# Patient Record
Sex: Female | Born: 1985
Health system: Southern US, Community
[De-identification: ages and names within clinical notes are randomized; demographics above are authoritative.]

## PROBLEM LIST (undated history)

## (undated) ENCOUNTER — Inpatient Hospital Stay (HOSPITAL_COMMUNITY): Payer: Self-pay

## (undated) DIAGNOSIS — O24419 Gestational diabetes mellitus in pregnancy, unspecified control: Secondary | ICD-10-CM

## (undated) DIAGNOSIS — I10 Essential (primary) hypertension: Secondary | ICD-10-CM

## (undated) DIAGNOSIS — Z973 Presence of spectacles and contact lenses: Secondary | ICD-10-CM

## (undated) DIAGNOSIS — E119 Type 2 diabetes mellitus without complications: Secondary | ICD-10-CM

## (undated) HISTORY — DX: Type 2 diabetes mellitus without complications: E11.9

## (undated) HISTORY — PX: BELOW KNEE LEG AMPUTATION: SUR23

## (undated) HISTORY — PX: TONSILLECTOMY AND ADENOIDECTOMY: SUR1326

## (undated) HISTORY — PX: OTHER SURGICAL HISTORY: SHX169

---

## 2016-03-30 ENCOUNTER — Ambulatory Visit (INDEPENDENT_AMBULATORY_CARE_PROVIDER_SITE_OTHER): Payer: BLUE CROSS/BLUE SHIELD | Admitting: Pediatrics

## 2016-03-30 ENCOUNTER — Other Ambulatory Visit: Payer: Self-pay | Admitting: *Deleted

## 2016-03-30 ENCOUNTER — Encounter: Payer: Self-pay | Admitting: Pediatrics

## 2016-03-30 VITALS — BP 130/100 | HR 101 | Temp 98.3°F | Ht 67.0 in | Wt 215.2 lb

## 2016-03-30 DIAGNOSIS — Z0000 Encounter for general adult medical examination without abnormal findings: Secondary | ICD-10-CM | POA: Diagnosis not present

## 2016-03-30 DIAGNOSIS — Z6833 Body mass index (BMI) 33.0-33.9, adult: Secondary | ICD-10-CM

## 2016-03-30 DIAGNOSIS — IMO0001 Reserved for inherently not codable concepts without codable children: Secondary | ICD-10-CM

## 2016-03-30 DIAGNOSIS — O10919 Unspecified pre-existing hypertension complicating pregnancy, unspecified trimester: Secondary | ICD-10-CM | POA: Insufficient documentation

## 2016-03-30 DIAGNOSIS — R030 Elevated blood-pressure reading, without diagnosis of hypertension: Secondary | ICD-10-CM

## 2016-03-30 LAB — BAYER DCA HB A1C WAIVED

## 2016-03-30 MED ORDER — METFORMIN HCL 1000 MG PO TABS: 1000.0000 mg | ORAL_TABLET | Freq: Two times a day (BID) | ORAL | Status: DC

## 2016-03-30 NOTE — Progress Notes (Signed)
    Subjective:    Patient ID: [name], female    DOB: 1985-12-30, 30 y.o.   MRN: [id number]  CC: New Patient (Initial Visit)  CPE  HPI: [name] is a 30 y.o. female presenting for New Patient (Initial Visit)  Trying to conceive Has had a hard time [facility] possible [facility] LMP 03/04/2016 Never had treatment for DM or HTN in the past Last pap smear 01/2016 No fam hx of colon or breast cancer  Has been told she has an elevated HgA1c, but is not sure what the number was  Lives at home with fiance   [facility] 03/30/2016  Decreased Interest 0  Down, Depressed, Hopeless 0  PHQ - 2 Score 0   PMH: getting worked up for PCOS  Family History  Problem Relation Age of Onset  . Diabetes Mother   . Diabetes Maternal Grandmother   . Hypertension Mother   . Hypertension Maternal Grandmother     History  Smoking status  . Former Smoker  . Quit date: 03/30/2006  Smokeless tobacco  . Not on file       Objective:    BP 157/108 mmHg, recheck 130/100  Pulse 101  Temp(Src) 98.3 F (36.8 C) (Oral)  Ht 5\' 7"  (1.702 m)  Wt 215 lb 3.2 oz (97.614 kg)  BMI 33.70 kg/m2  LMP 03/04/2016  Wt Readings from Last 3 Encounters:  03/30/16 215 lb 3.2 oz (97.614 kg)     Gen: NAD, alert, cooperative with exam, NCAT EYES: EOMI, no scleral injection or icterus ENT:   OP without erythema LYMPH: no cervical LAD CV: NRRR, normal S1/S2, no murmur, distal pulses 2+ b/l Resp: CTABL, no wheezes, normal WOB Abd: +BS, soft, NTND.  Ext: No edema, warm Neuro: Alert and oriented, coordination grossly normal MSK: normal muscle bulk     Assessment & Plan:    [name] was seen today for new patient (initial visit), complete physical exam.  Diagnoses and all orders for this visit:  Encounter for preventive health examination -     Basic Metabolic Panel  BMI 33.0-33.9,adult -     Bayer DCA Hb A1c Waived -     TSH -     Lipid  panel  Elevated blood pressure -     Basic Metabolic Panel   Follow up plan: Return for 1 week for nurse BP check, if still elevated needs to see me.  Rex Krasarol Vincent, MD [facility] 03/30/2016, 11:28 AM

## 2016-03-31 LAB — BASIC METABOLIC PANEL
BUN/Creatinine Ratio: 16 (ref 9–23)
BUN: 10 mg/dL (ref 6–20)
CALCIUM: 9.9 mg/dL (ref 8.7–10.2)
CHLORIDE: 94 mmol/L — AB (ref 96–106)
CO2: 24 mmol/L (ref 18–29)
CREATININE: 0.61 mg/dL (ref 0.57–1.00)
GFR calc Af Amer: 143 mL/min/{1.73_m2} (ref >59)
GFR calc non Af Amer: 124 mL/min/{1.73_m2} (ref >59)
GLUCOSE: 312 mg/dL — AB (ref 65–99)
Potassium: 4.7 mmol/L (ref 3.5–5.2)
Sodium: 135 mmol/L (ref 134–144)

## 2016-03-31 LAB — LIPID PANEL
CHOLESTEROL TOTAL: 177 mg/dL (ref 100–199)
Chol/HDL Ratio: 4.9 ratio units — ABNORMAL HIGH (ref 0.0–4.4)
HDL: 36 mg/dL — AB (ref >39)
LDL Calculated: 92 mg/dL (ref 0–99)
Triglycerides: 245 mg/dL — ABNORMAL HIGH (ref 0–149)
VLDL CHOLESTEROL CAL: 49 mg/dL — AB (ref 5–40)

## 2016-03-31 LAB — TSH: TSH: 1.08 u[IU]/mL (ref 0.450–4.500)

## 2016-04-06 ENCOUNTER — Ambulatory Visit: Payer: BLUE CROSS/BLUE SHIELD | Admitting: *Deleted

## 2016-04-06 VITALS — BP 122/82 | HR 103

## 2016-04-06 DIAGNOSIS — Z0130 Encounter for examination of blood pressure without abnormal findings: Secondary | ICD-10-CM

## 2016-04-14 ENCOUNTER — Encounter: Payer: Self-pay | Admitting: Pharmacist

## 2016-04-14 ENCOUNTER — Ambulatory Visit (INDEPENDENT_AMBULATORY_CARE_PROVIDER_SITE_OTHER): Payer: BLUE CROSS/BLUE SHIELD | Admitting: Pharmacist

## 2016-04-14 VITALS — BP 136/82 | HR 90 | Ht 67.0 in | Wt 221.0 lb

## 2016-04-14 DIAGNOSIS — E1165 Type 2 diabetes mellitus with hyperglycemia: Secondary | ICD-10-CM | POA: Diagnosis not present

## 2016-04-14 DIAGNOSIS — O24119 Pre-existing diabetes mellitus, type 2, in pregnancy, unspecified trimester: Secondary | ICD-10-CM | POA: Insufficient documentation

## 2016-04-14 NOTE — Patient Instructions (Signed)
Diabetes and Standards of Medical Care   Diabetes is complicated. You may find that your diabetes team includes a dietitian, nurse, diabetes educator, eye doctor, and more. To help everyone know what is going on and to help you get the care you deserve, the following schedule of care was developed to help keep you on track. Below are the tests, exams, vaccines, medicines, education, and plans you will need.  Blood Glucose Goals Prior to meals = 80 - 130 Within 2 hours of the start of a meal = less than 180  HbA1c test (goal is less than 7.0% - your last value was over 14%) This test shows how well you have controlled your glucose over the past 2 to 3 months. It is used to see if your diabetes management plan needs to be adjusted.   It is performed at least 2 times a year if you are meeting treatment goals.  It is performed 4 times a year if therapy has changed or if you are not meeting treatment goals.  Blood pressure test  This test is performed at every routine medical visit. The goal is less than 140/90 mmHg for most people, but 130/80 mmHg in some cases. Ask your health care provider about your goal.  Dental exam  Follow up with the dentist regularly.  Eye exam  If you are diagnosed with type 1 diabetes as a child, get an exam upon reaching the age of 60 years or older and have had diabetes for 3 to 5 years. Yearly eye exams are recommended after that initial eye exam.  If you are diagnosed with type 1 diabetes as an adult, get an exam within 5 years of diagnosis and then yearly.  If you are diagnosed with type 2 diabetes, get an exam as soon as possible after the diagnosis and then yearly.  Foot care exam  Visual foot exams are performed at every routine medical visit. The exams check for cuts, injuries, or other problems with the feet.  A comprehensive foot exam should be done yearly. This includes visual inspection as well as assessing foot pulses and testing for loss of  sensation.  Check your feet nightly for cuts, injuries, or other problems with your feet. Tell your health care provider if anything is not healing.  Kidney function test (urine microalbumin)  This test is performed once a year.  Type 1 diabetes: The first test is performed 5 years after diagnosis.  Type 2 diabetes: The first test is performed at the time of diagnosis.  A serum creatinine and estimated glomerular filtration rate (eGFR) test is done once a year to assess the level of chronic kidney disease (CKD), if present.  Lipid profile (cholesterol, HDL, LDL, triglycerides)  Performed every 5 years for most people.  The goal for LDL is less than 100 mg/dL. If you are at high risk, the goal is less than 70 mg/dL. Yours was 92.  The goal for HDL is 40 mg/dL to 50 mg/dL for men and 50 mg/dL to 60 mg/dL for women. An HDL cholesterol of 60 mg/dL or higher gives some protection against heart disease. Your was 47.  The goal for triglycerides is less than 150 mg/dL. Yours 245.  Influenza vaccine, pneumococcal vaccine, and hepatitis B vaccine  The influenza vaccine is recommended yearly.  The pneumococcal vaccine is generally given once in a lifetime. However, there are some instances when another vaccination is recommended. Check with your health care provider.  The hepatitis B  vaccine is also recommended for adults with diabetes.  Diabetes self-management education  Education is recommended at diagnosis and ongoing as needed.  Treatment plan  Your treatment plan is reviewed at every medical visit.  Document Released: 07/16/2009 Document Revised: 05/21/2013 Document Reviewed: 02/18/2013 [facility] Patient Information 2014 [place].

## 2016-04-14 NOTE — Progress Notes (Signed)
  Subjective:    [name] is a 30 y.o. female who presents for an initial evaluation of Type 2 diabetes mellitus.  Current symptoms/problems include polydipsia, polyuria and weight loss and have been improving. Symptoms have been present for 2 months.  The patient was initially diagnosed with Type 2 diabetes mellitus 06/29/2017based on the following criteria:  BG of 312 and A1c of over 14%  Known diabetic complications: none Cardiovascular risk factors: diabetes mellitus, obesity (BMI >= 30 kg/m2) and sedentary lifestyle Current diabetic medications include metformin 1000mg  bid.   Eye exam current (within one year): had eye exam 12/2015 but was prior to DM diagnosis Weight trend: fluctuating a bit Prior visit with dietician: no Current diet: in general, an "unhealthy" diet Current exercise: walking - started about 2 weeks ago  Current monitoring regimen: home blood tests - 1-2 times daily Home blood sugar records: has decreased to 200's over the last week;  previously was 300's Any episodes of hypoglycemia? no  Is She on ACE inhibitor or angiotensin II receptor blocker?  No  - patient wishes to get pregnant so I do not recommend that ACE or ARB therapy be started at this time  The following portions of the patient's history were reviewed and updated as appropriate: allergies, current medications, past family history, past medical history, past social history, past surgical history and problem list.    Objective:    BP 136/82 mmHg  Pulse 90  Ht 5\' 7"  (1.702 m)  Wt 221 lb (100.245 kg)  BMI 34.61 kg/m2  LMP 03/04/2016  A1c = over 14% (03/30/2016)  Lab Review GLUCOSE (mg/dL)  Date Value  16/10/960406/29/2017 312*   CO2 (mmol/L)  Date Value  03/30/2016 24   BUN (mg/dL)  Date Value  54/09/811906/29/2017 10   CREATININE, SER (mg/dL)  Date Value  14/78/295606/29/2017 0.61     Assessment:    Diabetes Mellitus type II, under inadequate control.    Plan:    1.  Rx changes: continue metformin  1000mg  bid.  2.  Education: Reviewed 'ABCs' of diabetes management (respective goals in parentheses):  A1C (<7), blood pressure (<130/80), and cholesterol (LDL <100). Discussed HBG goals. 3. Discussed pathophysiology of DM; difference between type 1 and type 2 DM. 4. CHO counting diet discussed.  Reviewed CHO amount in various foods and how to read nutrition labels.  Discussed recommended serving sizes. Samples diet given.  5.  Recommended increase physical activity - goal is 150 minutes per week 4. Follow up: 1 month

## 2016-04-19 ENCOUNTER — Encounter: Payer: Self-pay | Admitting: Pediatrics

## 2016-04-19 ENCOUNTER — Ambulatory Visit (INDEPENDENT_AMBULATORY_CARE_PROVIDER_SITE_OTHER): Payer: BLUE CROSS/BLUE SHIELD | Admitting: Pediatrics

## 2016-04-19 VITALS — BP 124/90 | HR 106 | Temp 98.9°F | Ht 67.0 in | Wt 214.6 lb

## 2016-04-19 DIAGNOSIS — N390 Urinary tract infection, site not specified: Secondary | ICD-10-CM

## 2016-04-19 DIAGNOSIS — R030 Elevated blood-pressure reading, without diagnosis of hypertension: Secondary | ICD-10-CM | POA: Diagnosis not present

## 2016-04-19 DIAGNOSIS — IMO0001 Reserved for inherently not codable concepts without codable children: Secondary | ICD-10-CM

## 2016-04-19 DIAGNOSIS — E1165 Type 2 diabetes mellitus with hyperglycemia: Secondary | ICD-10-CM | POA: Diagnosis not present

## 2016-04-19 DIAGNOSIS — Z6833 Body mass index (BMI) 33.0-33.9, adult: Secondary | ICD-10-CM | POA: Diagnosis not present

## 2016-04-19 DIAGNOSIS — N926 Irregular menstruation, unspecified: Secondary | ICD-10-CM

## 2016-04-19 DIAGNOSIS — N3000 Acute cystitis without hematuria: Secondary | ICD-10-CM

## 2016-04-19 DIAGNOSIS — R300 Dysuria: Secondary | ICD-10-CM

## 2016-04-19 DIAGNOSIS — R8281 Pyuria: Secondary | ICD-10-CM

## 2016-04-19 LAB — URINALYSIS, COMPLETE
Bilirubin, UA: NEGATIVE
Nitrite, UA: NEGATIVE
SPEC GRAV UA: 1.025 (ref 1.005–1.030)
Urobilinogen, Ur: 0.2 mg/dL (ref 0.2–1.0)
pH, UA: 5.5 (ref 5.0–7.5)

## 2016-04-19 LAB — MICROSCOPIC EXAMINATION

## 2016-04-19 LAB — PREGNANCY, URINE: PREG TEST UR: NEGATIVE

## 2016-04-19 NOTE — Progress Notes (Addendum)
    Subjective:    Patient ID: [name], female    DOB: [birth year], 30 y.o.   MRN: [id number]  CC: Urinary Tract Infection   HPI: [name] is a 30 y.o. female presenting for Urinary Tract Infection  Problems with most of the time going to the bathroom, has pain and pressure, and some burning Ongoing past three days No blood in urine No cramping OTC cystex helped some Trying to get pregnant No fevers BGLs comeing down, initially in 300s, now 200s in the morning Taking metformin daily Headaches improved     [facility] 04/19/2016 04/14/2016 04/06/2016 03/30/2016  Decreased Interest 0 0 0 0  Down, Depressed, Hopeless 0 0 0 0  PHQ - 2 Score 0 0 0 0     Relevant past medical, surgical, family and social history reviewed and updated as indicated.  Interim medical history since our last visit reviewed. Allergies and medications reviewed and updated.  ROS: Per HPI unless specifically indicated above  History  Smoking status  . Former Smoker -- 0 years  . Quit date: 03/30/2006  Smokeless tobacco  . Not on file       Objective:    BP 124/90 mmHg  Pulse 106  Temp(Src) 98.9 F (37.2 C) (Oral)  Ht 5\' 7"  (1.702 m)  Wt 214 lb 9.6 oz (97.342 kg)  BMI 33.60 kg/m2  LMP 03/04/2016  Wt Readings from Last 3 Encounters:  04/19/16 214 lb 9.6 oz (97.342 kg)  04/14/16 221 lb (100.245 kg)  03/30/16 215 lb 3.2 oz (97.614 kg)    Gen: NAD, alert, cooperative with exam, NCAT EYES: EOMI, no scleral injection or icterus CV: NRRR, normal S1/S2, no murmur, distal pulses 2+ b/l Resp: CTABL, no wheezes, normal WOB Abd: +BS, soft, NTND. no guarding or organomegaly, no CVA tenderness Ext: No edema, warm Neuro: Alert and oriented     Assessment & Plan:    [name] was seen today for urinary tract infection symptoms and follow up.  Diagnoses and all orders for this visit:  Dysuria Small amount of WBC Symptoms intermittent not with every void Will send urine  culture, treat as indicated Pt trying to get pregnant -     Urinalysis, Complete  Missed period 10 days late Irregular periods with PCOS Neg home test 3 days ago -     Pregnancy, urine  Pyuria -     Urine culture  Type 2 diabetes mellitus with hyperglycemia, without long-term current use of insulin (HCC) Metformin improving AM BGLs continue  BMI 33.0-33.9,adult Cont metformin, lifestyle changes  Elevated blood pressure Improved with recheck to 124/90, still slightly elevated Cont lifestyle changes as above   Follow up plan: Return in about 3 months (around 07/13/2016) for DM follow up.  Rex Krasarol Jujhar Everett, MD [facility] 04/19/2016, 10:43 AM  ADDENDUM: growing serratia, treating with cipro 250mg  BID for 3 days.

## 2016-04-21 LAB — URINE CULTURE

## 2016-04-21 MED ORDER — CIPROFLOXACIN HCL 250 MG PO TABS
250.0000 mg | ORAL_TABLET | Freq: Two times a day (BID) | ORAL | Status: DC
Start: 2016-04-21 — End: 2018-05-28

## 2016-04-21 NOTE — Addendum Note (Signed)
Addended by: Johna SheriffVINCENT, CAROL L on: 04/21/2016 01:21 PM   Modules accepted: Orders, SmartSet

## 2016-05-16 ENCOUNTER — Ambulatory Visit: Payer: Self-pay | Admitting: Pharmacist

## 2016-05-17 ENCOUNTER — Encounter: Payer: Self-pay | Admitting: Pediatrics

## 2016-05-23 ENCOUNTER — Telehealth: Payer: Self-pay | Admitting: Pharmacist

## 2016-05-23 NOTE — Telephone Encounter (Signed)
Patient misses follow up diabetes appt last week.  I called to see how BG was going and to reschedule.  No answer but left message on VM

## 2016-10-13 ENCOUNTER — Telehealth: Payer: Self-pay | Admitting: Pharmacist

## 2016-10-13 NOTE — Telephone Encounter (Signed)
Patient called and reminded that f/u with PCP is recommended for DM.  No ans but LM on VM

## 2018-05-28 ENCOUNTER — Telehealth: Payer: Self-pay | Admitting: Pediatrics

## 2018-05-28 ENCOUNTER — Ambulatory Visit (INDEPENDENT_AMBULATORY_CARE_PROVIDER_SITE_OTHER): Payer: BLUE CROSS/BLUE SHIELD | Admitting: Pediatrics

## 2018-05-28 ENCOUNTER — Encounter: Payer: Self-pay | Admitting: Pediatrics

## 2018-05-28 VITALS — BP 137/84 | HR 105 | Temp 99.9°F | Ht 67.0 in | Wt 201.2 lb

## 2018-05-28 DIAGNOSIS — E1165 Type 2 diabetes mellitus with hyperglycemia: Secondary | ICD-10-CM

## 2018-05-28 DIAGNOSIS — E119 Type 2 diabetes mellitus without complications: Secondary | ICD-10-CM | POA: Diagnosis not present

## 2018-05-28 DIAGNOSIS — N926 Irregular menstruation, unspecified: Secondary | ICD-10-CM

## 2018-05-28 LAB — BMP8+EGFR
BUN / CREAT RATIO: 18 (ref 9–23)
BUN: 11 mg/dL (ref 6–20)
CHLORIDE: 97 mmol/L (ref 96–106)
CO2: 21 mmol/L (ref 20–29)
Calcium: 9.5 mg/dL (ref 8.7–10.2)
Creatinine, Ser: 0.62 mg/dL (ref 0.57–1.00)
GFR calc non Af Amer: 121 mL/min/{1.73_m2} (ref >59)
GFR, EST AFRICAN AMERICAN: 140 mL/min/{1.73_m2} (ref >59)
Glucose: 259 mg/dL — ABNORMAL HIGH (ref 65–99)
Potassium: 4.6 mmol/L (ref 3.5–5.2)
Sodium: 133 mmol/L — ABNORMAL LOW (ref 134–144)

## 2018-05-28 LAB — BAYER DCA HB A1C WAIVED: HB A1C: 12.3 % — AB (ref <7.0)

## 2018-05-28 LAB — PREGNANCY, URINE: Preg Test, Ur: POSITIVE — AB

## 2018-05-28 MED ORDER — PEN NEEDLES 31G X 5 MM MISC: 1.0000 | Freq: Every day | 2 refills | Status: DC | PRN

## 2018-05-28 MED ORDER — INSULIN LISPRO 200 UNIT/ML ~~LOC~~ SOPN: 0.0000 [IU] | PEN_INJECTOR | Freq: Three times a day (TID) | SUBCUTANEOUS | 0 refills | Status: DC

## 2018-05-28 MED ORDER — INSULIN DETEMIR 100 UNIT/ML FLEXPEN: 25.0000 [IU] | PEN_INJECTOR | Freq: Every day | SUBCUTANEOUS | 11 refills | Status: DC

## 2018-05-28 NOTE — Progress Notes (Signed)
Subjective:   Patient ID: [name], female    DOB: 02-Jan-1986, 32 y.o.   MRN: [id number] CC: Positive home pregnancy test  HPI: [name] is a 32 y.o. female   Last menstrual period July 5-11.  Had a positive home pregnancy test at home.  Positive Upreg here as well.  History of type 2 diabetes.  Diagnosed 1 to 2 years ago.  Was started on insulin at time of diagnosis because of elevated blood sugars.  Following in [facility] at the time.  She has been off of insulin and metformin for the last few months.  She is not taking prenatal vitamins.  She is been feeling slightly nauseous, otherwise feeling her normal self.  She works third shift.  She eats multiple small meals a day rather than regularly scheduled meals.  She does not have an OB gynecologist who she is been seeing regularly. No abd pain, cramping or bleeding.  Relevant past medical, surgical, family and social history reviewed. Allergies and medications reviewed and updated. Social History   Tobacco Use  Smoking Status Former Smoker  . Years: 0.00  . Last attempt to quit: 03/30/2006  . Years since quitting: 12.1  Smokeless Tobacco Never Used   ROS: Per HPI   Objective:    BP 137/84   Pulse (!) 105   Temp 99.9 F (37.7 C) (Oral)   Ht '5\' 7"'  (1.702 m)   Wt 201 lb 3.2 oz (91.3 kg)   LMP 04/05/2018   BMI 31.51 kg/m   Wt Readings from Last 3 Encounters:  05/28/18 201 lb 3.2 oz (91.3 kg)  04/19/16 214 lb 9.6 oz (97.3 kg)  04/14/16 221 lb (100.2 kg)    Gen: NAD, alert, cooperative with exam, NCAT EYES: EOMI, no conjunctival injection, or no icterus ENT:  TMs pearly gray b/l, OP without erythema LYMPH: no cervical LAD CV: NRRR, normal S1/S2, no murmur, distal pulses 2+ b/l Resp: CTABL, no wheezes, normal WOB Abd: +BS, soft, NTND. no guarding or organomegaly Ext: No edema, warm Neuro: Alert and oriented, strength equal b/l UE and LE, coordination grossly normal MSK: normal  muscle bulk  Assessment & Plan:  Nohelia was seen today for positive home pregnancy test.  Diagnoses and all orders for this visit:  Missed period LMP started 7/5 -     Pregnancy, urine -     Beta hCG quant (ref lab) -     Ambulatory referral to Obstetrics / Gynecology -     Ambulatory referral to Endocrinology  Diabetes mellitus without complication ([place]) -     Bayer DCA Hb A1c Waived -     BMP8+EGFR  Uncontrolled type 2 diabetes mellitus with hyperglycemia (HCC) A1c 12.3 off of DM2 medicines.  Restart Levemir, 25 units once a day at night.  Start short acting insulin lispro, 3 units plus sliding scale 3 times a day before meals.  Check blood sugars 3 times a day before meals and before Levemir at night, send me home sugar numbers in 5 days.  Follow-up in 1 week.  Referral in to endocrinology and OB to establish care. -     Ambulatory referral to Obstetrics / Gynecology -     Ambulatory referral to Endocrinology -     Insulin Detemir (LEVEMIR) 100 UNIT/ML Pen; Inject 25 Units into the skin at bedtime. -     TSH -     Insulin Lispro (HUMALOG KWIKPEN) 200 UNIT/ML SOPN; Inject 0-15 Units into the  skin 3 (three) times daily before meals. As directed   Follow up plan: 1 week Assunta Found, MD [place]

## 2018-05-28 NOTE — Telephone Encounter (Signed)
Cancelled prescription sent to wrong pharmacy, [facility] [place], patient aware

## 2018-05-28 NOTE — Patient Instructions (Addendum)
Check glucose level before meals and before giving levemir.   Long acting insulin detemir 25units at night.  Short acting insulin: give 3 units plus sliding scale per below before meals three times a day  Send me glucose readings in 5 days.   CBG 151 - 200: 3 units;  CBG 201 - 250: 5 units;  CBG 251 - 300: 7 units;  CBG 301 - 350: 9 units;   CBG > 400 : 11 units and notify your doctor immediately. Recheck CBG in 1 hour

## 2018-05-29 LAB — BETA HCG QUANT (REF LAB): hCG Quant: 15937 m[IU]/mL

## 2018-05-30 ENCOUNTER — Telehealth: Payer: Self-pay | Admitting: Pediatrics

## 2018-05-30 NOTE — Telephone Encounter (Signed)
Aware of results. 

## 2018-05-30 NOTE — Telephone Encounter (Signed)
hasnt been looked at yet? Wants result of pregnancy and a1c

## 2018-06-20 ENCOUNTER — Encounter: Payer: Self-pay | Admitting: Family Medicine

## 2018-06-20 ENCOUNTER — Ambulatory Visit (INDEPENDENT_AMBULATORY_CARE_PROVIDER_SITE_OTHER): Payer: BLUE CROSS/BLUE SHIELD | Admitting: Family Medicine

## 2018-06-20 VITALS — BP 136/87 | HR 110 | Wt 214.0 lb

## 2018-06-20 DIAGNOSIS — Z1151 Encounter for screening for human papillomavirus (HPV): Secondary | ICD-10-CM | POA: Diagnosis not present

## 2018-06-20 DIAGNOSIS — Z113 Encounter for screening for infections with a predominantly sexual mode of transmission: Secondary | ICD-10-CM

## 2018-06-20 DIAGNOSIS — Z124 Encounter for screening for malignant neoplasm of cervix: Secondary | ICD-10-CM

## 2018-06-20 DIAGNOSIS — O0991 Supervision of high risk pregnancy, unspecified, first trimester: Secondary | ICD-10-CM | POA: Insufficient documentation

## 2018-06-20 DIAGNOSIS — Z01419 Encounter for gynecological examination (general) (routine) without abnormal findings: Secondary | ICD-10-CM | POA: Diagnosis not present

## 2018-06-20 DIAGNOSIS — Z3687 Encounter for antenatal screening for uncertain dates: Secondary | ICD-10-CM

## 2018-06-20 DIAGNOSIS — E1165 Type 2 diabetes mellitus with hyperglycemia: Secondary | ICD-10-CM

## 2018-06-20 DIAGNOSIS — Z6833 Body mass index (BMI) 33.0-33.9, adult: Secondary | ICD-10-CM

## 2018-06-20 MED ORDER — INSULIN DETEMIR 100 UNIT/ML FLEXPEN: 20.0000 [IU] | PEN_INJECTOR | Freq: Two times a day (BID) | SUBCUTANEOUS | 11 refills | Status: DC

## 2018-06-20 MED ORDER — INSULIN LISPRO 200 UNIT/ML ~~LOC~~ SOPN: 6.0000 [IU] | PEN_INJECTOR | Freq: Three times a day (TID) | SUBCUTANEOUS | 0 refills | Status: DC

## 2018-06-20 NOTE — Progress Notes (Signed)
Subjective:  [name] is a G1P0 79w4dbeing seen today for her first obstetrical visit.  Her obstetrical history is significant for type 2 diabetes - not controlled. Patient does intend to breast feed. Pregnancy history fully reviewed.  Patient reports no complaints.  BP 136/87   Pulse (!) 110   Wt 214 lb 0.6 oz (97.1 kg)   LMP 04/05/2018 (Exact Date)   BMI 33.52 kg/m   HISTORY: OB History  Gravida Para Term Preterm AB Living  1            SAB TAB Ectopic Multiple Live Births               # Outcome Date GA Lbr Len/2nd Weight Sex Delivery Anes PTL Lv  1 Current             Past Medical History:  Diagnosis Date  . Diabetes mellitus without complication ([facility]     Past Surgical History:  Procedure Laterality Date  . TONSILLECTOMY AND ADENOIDECTOMY      Family History  Problem Relation Age of Onset  . Diabetes Mother   . Hypertension Mother   . Hypertension Maternal Grandmother   . Diabetes Maternal Aunt   . Diabetes Maternal Uncle      Exam    Uterus:     Pelvic Exam:    Perineum: No Hemorrhoids, Normal Perineum   Vulva: normal, Bartholin's, Urethra, Skene's normal   Vagina:  normal mucosa, normal discharge   Cervix: anteverted, no bleeding following Pap, no cervical motion tenderness and nulliparous appearance   Adnexa: normal adnexa and no mass, fullness, tenderness   Bony Pelvis: gynecoid  System: Breast:  normal appearance, no masses or tenderness, Inspection negative, No nipple retraction or dimpling, No nipple discharge or bleeding, No axillary or supraclavicular adenopathy   Skin: normal coloration and turgor, no rashes    Neurologic: gait normal; reflexes normal and symmetric   Extremities: normal strength, tone, and muscle mass   HEENT PERRLA and extra ocular movement intact   Mouth/Teeth mucous membranes moist, pharynx normal without lesions   Neck supple and no masses   Cardiovascular: regular rate and rhythm, no murmurs or gallops   Respiratory:  appears well, vitals normal, no respiratory distress, acyanotic, normal RR, ear and throat exam is normal, neck free of mass or lymphadenopathy, chest clear, no wheezing, crepitations, rhonchi, normal symmetric air entry   Abdomen: soft, non-tender; bowel sounds normal; no masses,  no organomegaly   Urinary: urethral meatus normal      Assessment:    Pregnancy: G1P0 Patient Active Problem List   Diagnosis Date Noted  . Supervision of high risk pregnancy, antepartum, first trimester 06/20/2018  . Type 2 diabetes mellitus ([place] 04/14/2016  . BMI 33.0-33.9,adult 03/30/2016  . Elevated blood pressure 03/30/2016      Plan:   1. Supervision of high risk pregnancy, antepartum, first trimester Desires Quad screen PNL Discussed nature of practice  - [facility] AMB BABYSCRIPTS OPT IN - Culture, OB Urine - Cystic fibrosis gene test - Cytology - PAP - Hemoglobinopathy Evaluation - Obstetric Panel, Including HIV - SMN1 COPY NUMBER ANALYSIS (SMA Carrier Screen) - HgB A1c - Comp Met (CMET) - POC Urinalysis Dipstick OB - Protein / creatinine ratio, urine - TSH  2. Uncontrolled type 2 diabetes mellitus with hyperglycemia (HCC) Increase levemir to 20units BID Aspart 6 units premeal Check CBGs fasting and 1hr PP. CMP, UP:C Fetal echo Already referred to ophthalmology - Ambulatory referral to Pediatric Cardiology -  Insulin Detemir (LEVEMIR) 100 UNIT/ML Pen; Inject 20 Units into the skin 2 (two) times daily.  Dispense: 15 mL; Refill: 11 - Insulin Lispro (HUMALOG KWIKPEN) 200 UNIT/ML SOPN; Inject 6 Units into the skin 3 (three) times daily before meals.  Dispense: 9 mL; Refill: 0  3. BMI 33.0-33.9,adult      Problem list reviewed and updated. 75% of 45 min visit spent on counseling and coordination of care.     [name] 06/20/2018

## 2018-06-20 NOTE — Progress Notes (Signed)
DATING AND VIABILITY SONOGRAM   [name] is a 32 y.o. year old G1P0 with LMP Patient's last menstrual period was 04/05/2018 (exact date). which would correlate to  4938w6d weeks gestation.  She has regular menstrual cycles.   She is here today for a confirmatory initial sonogram.    GESTATION: SINGLETON  FETAL ACTIVITY:          Heart rate         180          The fetus is active.     GESTATIONAL AGE AND  BIOMETRICS:  Gestational criteria: Estimated Date of Delivery: 01/10/19 by LMP now at 9038w6d  Previous Scans:0      CROWN RUMP LENGTH        2.78 cm        9-4 weeks                                                                               AVERAGE EGA(BY THIS SCAN): 9-4weeks  WORKING EDD( early ultrasound ):  01-19-19     Armandina StammerJennifer Howard 06/20/2018 10:26 AM

## 2018-06-21 LAB — PROTEIN / CREATININE RATIO, URINE
Creatinine, Urine: 32.9 mg/dL
Protein, Ur: 37.7 mg/dL
Protein/Creat Ratio: 1146 mg/g creat — ABNORMAL HIGH (ref 0–200)

## 2018-06-21 LAB — TSH: TSH: 1.45 u[IU]/mL (ref 0.450–4.500)

## 2018-06-22 LAB — URINE CULTURE, OB REFLEX

## 2018-06-22 LAB — CULTURE, OB URINE

## 2018-06-23 DIAGNOSIS — O0991 Supervision of high risk pregnancy, unspecified, first trimester: Secondary | ICD-10-CM

## 2018-06-24 ENCOUNTER — Encounter: Payer: Self-pay | Admitting: Family Medicine

## 2018-06-24 DIAGNOSIS — O1210 Gestational proteinuria, unspecified trimester: Secondary | ICD-10-CM | POA: Insufficient documentation

## 2018-06-25 ENCOUNTER — Encounter: Payer: Self-pay | Admitting: Family Medicine

## 2018-06-25 DIAGNOSIS — R87618 Other abnormal cytological findings on specimens from cervix uteri: Secondary | ICD-10-CM | POA: Insufficient documentation

## 2018-06-25 DIAGNOSIS — R8789 Other abnormal findings in specimens from female genital organs: Secondary | ICD-10-CM | POA: Insufficient documentation

## 2018-06-25 LAB — CYTOLOGY - PAP
CHLAMYDIA, DNA PROBE: NEGATIVE
Diagnosis: NEGATIVE
HPV: DETECTED — AB
Neisseria Gonorrhea: NEGATIVE

## 2018-06-28 LAB — OBSTETRIC PANEL, INCLUDING HIV
Antibody Screen: NEGATIVE
BASOS ABS: 0.1 10*3/uL (ref 0.0–0.2)
Basos: 1 %
EOS (ABSOLUTE): 0.1 10*3/uL (ref 0.0–0.4)
Eos: 0 %
HEP B S AG: NEGATIVE
HIV SCREEN 4TH GENERATION: NONREACTIVE
Hematocrit: 36 % (ref 34.0–46.6)
Hemoglobin: 12.3 g/dL (ref 11.1–15.9)
Immature Grans (Abs): 0 10*3/uL (ref 0.0–0.1)
Immature Granulocytes: 0 %
LYMPHS ABS: 2.8 10*3/uL (ref 0.7–3.1)
Lymphs: 24 %
MCH: 29.4 pg (ref 26.6–33.0)
MCHC: 34.2 g/dL (ref 31.5–35.7)
MCV: 86 fL (ref 79–97)
MONOCYTES: 6 %
Monocytes Absolute: 0.7 10*3/uL (ref 0.1–0.9)
NEUTROS ABS: 8.2 10*3/uL — AB (ref 1.4–7.0)
Neutrophils: 69 %
PLATELETS: 474 10*3/uL — AB (ref 150–450)
RBC: 4.18 x10E6/uL (ref 3.77–5.28)
RDW: 12.9 % (ref 12.3–15.4)
RH TYPE: POSITIVE
RPR: NONREACTIVE
RUBELLA: 1.73 {index} (ref >0.99)
WBC: 11.8 10*3/uL — AB (ref 3.4–10.8)

## 2018-06-28 LAB — SMN1 COPY NUMBER ANALYSIS (SMA CARRIER SCREENING)

## 2018-06-28 LAB — HEMOGLOBINOPATHY EVALUATION
FERRITIN: 95 ng/mL (ref 15–150)
HGB A: 98 % (ref 96.4–98.8)
HGB C: 0 %
HGB VARIANT: 0 %
Hgb A2 Quant: 2 % (ref 1.8–3.2)
Hgb F Quant: 0 % (ref 0.0–2.0)
Hgb S: 0 %
Hgb Solubility: NEGATIVE

## 2018-06-28 LAB — COMPREHENSIVE METABOLIC PANEL
ALK PHOS: 47 IU/L (ref 39–117)
ALT: 16 IU/L (ref 0–32)
AST: 12 IU/L (ref 0–40)
Albumin/Globulin Ratio: 1.3 (ref 1.2–2.2)
Albumin: 4.2 g/dL (ref 3.5–5.5)
BILIRUBIN TOTAL: 0.6 mg/dL (ref 0.0–1.2)
BUN/Creatinine Ratio: 17 (ref 9–23)
BUN: 9 mg/dL (ref 6–20)
CHLORIDE: 101 mmol/L (ref 96–106)
CO2: 23 mmol/L (ref 20–29)
CREATININE: 0.52 mg/dL — AB (ref 0.57–1.00)
Calcium: 9.9 mg/dL (ref 8.7–10.2)
GFR calc Af Amer: 148 mL/min/{1.73_m2} (ref >59)
GFR calc non Af Amer: 129 mL/min/{1.73_m2} (ref >59)
GLUCOSE: 138 mg/dL — AB (ref 65–99)
Globulin, Total: 3.3 g/dL (ref 1.5–4.5)
Potassium: 4.4 mmol/L (ref 3.5–5.2)
Sodium: 139 mmol/L (ref 134–144)
Total Protein: 7.5 g/dL (ref 6.0–8.5)

## 2018-06-28 LAB — HEMOGLOBIN A1C
ESTIMATED AVERAGE GLUCOSE: 258 mg/dL
Hgb A1c MFr Bld: 10.6 % — ABNORMAL HIGH (ref 4.8–5.6)

## 2018-06-28 LAB — CYSTIC FIBROSIS GENE TEST

## 2018-07-04 ENCOUNTER — Ambulatory Visit (INDEPENDENT_AMBULATORY_CARE_PROVIDER_SITE_OTHER): Payer: BLUE CROSS/BLUE SHIELD | Admitting: Family Medicine

## 2018-07-04 VITALS — BP 141/79 | HR 115 | Wt 215.0 lb

## 2018-07-04 DIAGNOSIS — O0991 Supervision of high risk pregnancy, unspecified, first trimester: Secondary | ICD-10-CM

## 2018-07-04 DIAGNOSIS — O09891 Supervision of other high risk pregnancies, first trimester: Secondary | ICD-10-CM | POA: Diagnosis not present

## 2018-07-04 DIAGNOSIS — R87618 Other abnormal cytological findings on specimens from cervix uteri: Secondary | ICD-10-CM

## 2018-07-04 DIAGNOSIS — E1165 Type 2 diabetes mellitus with hyperglycemia: Secondary | ICD-10-CM

## 2018-07-04 DIAGNOSIS — Z6833 Body mass index (BMI) 33.0-33.9, adult: Secondary | ICD-10-CM

## 2018-07-04 DIAGNOSIS — R8789 Other abnormal findings in specimens from female genital organs: Secondary | ICD-10-CM

## 2018-07-04 MED ORDER — INSULIN DETEMIR 100 UNIT/ML FLEXPEN: 30.0000 [IU] | PEN_INJECTOR | Freq: Two times a day (BID) | SUBCUTANEOUS | 11 refills | Status: DC

## 2018-07-04 MED ORDER — METFORMIN HCL 1000 MG PO TABS: 500.0000 mg | ORAL_TABLET | Freq: Two times a day (BID) | ORAL | 3 refills | Status: DC

## 2018-07-04 MED ORDER — INSULIN LISPRO 200 UNIT/ML ~~LOC~~ SOPN: 10.0000 [IU] | PEN_INJECTOR | Freq: Three times a day (TID) | SUBCUTANEOUS | 0 refills | Status: DC

## 2018-07-04 NOTE — Progress Notes (Signed)
Patient would like to discuss blood sugars and changing her insulin doses. [name] RN

## 2018-07-04 NOTE — Addendum Note (Signed)
Addended by: Anell Barr on: 07/04/2018 10:52 AM   Modules accepted: Orders

## 2018-07-04 NOTE — Progress Notes (Signed)
Subjective:  [name] is a 32 y.o. G1P0 at [redacted]w[redacted]d being seen today for ongoing prenatal care.  She is currently monitored for the following issues for this high-risk pregnancy and has BMI 33.0-33.9,adult; Elevated blood pressure; Type 2 diabetes mellitus (HCC); Supervision of high risk pregnancy, antepartum, first trimester; Proteinuria affecting pregnancy; and Pap smear abnormality of cervix/human papillomavirus (HPV) positive on their problem list.  GDM: Patient taking metformin and insulin.  Reports 1 hypoglycemic episodes.  Tolerating medication well. Was nervous about taking metformin, so didn't Fasting: works third shift, no real fasting CBG 2hr PP: 140-200  Patient reports no complaints.  Contractions: Not present. Vag. Bleeding: None.   . Denies leaking of fluid.   The following portions of the patient's history were reviewed and updated as appropriate: allergies, current medications, past family history, past medical history, past social history, past surgical history and problem list. Problem list updated.  Objective:   Vitals:   07/04/18 0947  BP: (!) 141/79  Pulse: (!) 115  Weight: 215 lb (97.5 kg)    Fetal Status:           General:  Alert, oriented and cooperative. Patient is in no acute distress.  Skin: Skin is warm and dry. No rash noted.   Cardiovascular: Normal heart rate noted  Respiratory: Normal respiratory effort, no problems with respiration noted  Abdomen: Soft, gravid, appropriate for gestational age. Pain/Pressure: Absent     Pelvic: Vag. Bleeding: None     Cervical exam deferred        Extremities: Normal range of motion.  Edema: None  Mental Status: Normal mood and affect. Normal behavior. Normal judgment and thought content.   Urinalysis:      Assessment and Plan:  Pregnancy: G1P0 at [redacted]w[redacted]d  1. Supervision of high risk pregnancy, antepartum, first trimester FHT normal. Heartburn  2. Uncontrolled type 2 diabetes mellitus with hyperglycemia  (HCC) Reassured pt regarding metformin Increase levemir to 30 units bid humalog to 10 units premeal - Insulin Lispro (HUMALOG KWIKPEN) 200 UNIT/ML SOPN; Inject 10 Units into the skin 3 (three) times daily before meals.  Dispense: 9 mL; Refill: 0 - Insulin Detemir (LEVEMIR) 100 UNIT/ML Pen; Inject 30 Units into the skin 2 (two) times daily.  Dispense: 15 mL; Refill: 11  3. BMI 33.0-33.9,adult   4. Pap smear abnormality of cervix/human papillomavirus (HPV) positive PAP in 1 year.  Preterm labor symptoms and general obstetric precautions including but not limited to vaginal bleeding, contractions, leaking of fluid and fetal movement were reviewed in detail with the patient. Please refer to After Visit Summary for other counseling recommendations.  No follow-ups on file.   Levie Heritage, DO

## 2018-07-11 ENCOUNTER — Encounter: Payer: Self-pay | Admitting: Endocrinology

## 2018-07-19 ENCOUNTER — Ambulatory Visit (INDEPENDENT_AMBULATORY_CARE_PROVIDER_SITE_OTHER): Payer: BLUE CROSS/BLUE SHIELD | Admitting: Obstetrics & Gynecology

## 2018-07-19 VITALS — BP 126/79 | HR 113 | Wt 217.1 lb

## 2018-07-19 DIAGNOSIS — O24111 Pre-existing diabetes mellitus, type 2, in pregnancy, first trimester: Secondary | ICD-10-CM

## 2018-07-19 DIAGNOSIS — O10919 Unspecified pre-existing hypertension complicating pregnancy, unspecified trimester: Secondary | ICD-10-CM

## 2018-07-19 DIAGNOSIS — Z23 Encounter for immunization: Secondary | ICD-10-CM | POA: Diagnosis not present

## 2018-07-19 DIAGNOSIS — O0991 Supervision of high risk pregnancy, unspecified, first trimester: Secondary | ICD-10-CM

## 2018-07-19 DIAGNOSIS — O10911 Unspecified pre-existing hypertension complicating pregnancy, first trimester: Secondary | ICD-10-CM

## 2018-07-19 MED ORDER — METFORMIN HCL 1000 MG PO TABS: 1000.0000 mg | ORAL_TABLET | Freq: Two times a day (BID) | ORAL | 3 refills | Status: DC

## 2018-07-19 MED ORDER — ASPIRIN EC 81 MG PO TBEC: 81.0000 mg | DELAYED_RELEASE_TABLET | Freq: Every day | ORAL | 2 refills | Status: DC

## 2018-07-19 NOTE — Patient Instructions (Addendum)
First Trimester of Pregnancy The first trimester of pregnancy is from week 1 until the end of week 13 (months 1 through 3). During this time, your baby will begin to develop inside you. At 6-8 weeks, the eyes and face are formed, and the heartbeat can be seen on ultrasound. At the end of 12 weeks, all the baby's organs are formed. Prenatal care is all the medical care you receive before the birth of your baby. Make sure you get good prenatal care and follow all of your doctor's instructions. Follow these instructions at home: Medicines  Take over-the-counter and prescription medicines only as told by your doctor. Some medicines are safe and some medicines are not safe during pregnancy.  Take a prenatal vitamin that contains at least 600 micrograms (mcg) of folic acid.  If you have trouble pooping (constipation), take medicine that will make your stool soft (stool softener) if your doctor approves. Eating and drinking  Eat regular, healthy meals.  Your doctor will tell you the amount of weight gain that is right for you.  Avoid raw meat and uncooked cheese.  If you feel sick to your stomach (nauseous) or throw up (vomit): ? Eat 4 or 5 small meals a day instead of 3 large meals. ? Try eating a few soda crackers. ? Drink liquids between meals instead of during meals.  To prevent constipation: ? Eat foods that are high in fiber, like fresh fruits and vegetables, whole grains, and beans. ? Drink enough fluids to keep your pee (urine) clear or pale yellow. Activity  Exercise only as told by your doctor. Stop exercising if you have cramps or pain in your lower belly (abdomen) or low back.  Do not exercise if it is too hot, too humid, or if you are in a place of great height (high altitude).  Try to avoid standing for long periods of time. Move your legs often if you must stand in one place for a long time.  Avoid heavy lifting.  Wear low-heeled shoes. Sit and stand up straight.  You  can have sex unless your doctor tells you not to. Relieving pain and discomfort  Wear a good support bra if your breasts are sore.  Take warm water baths (sitz baths) to soothe pain or discomfort caused by hemorrhoids. Use hemorrhoid cream if your doctor says it is okay.  Rest with your legs raised if you have leg cramps or low back pain.  If you have puffy, bulging veins (varicose veins) in your legs: ? Wear support hose or compression stockings as told by your doctor. ? Raise (elevate) your feet for 15 minutes, 3-4 times a day. ? Limit salt in your food. Prenatal care  Schedule your prenatal visits by the twelfth week of pregnancy.  Write down your questions. Take them to your prenatal visits.  Keep all your prenatal visits as told by your doctor. This is important. Safety  Wear your seat belt at all times when driving.  Make a list of emergency phone numbers. The list should include numbers for family, friends, the hospital, and police and fire departments. General instructions  Ask your doctor for a referral to a local prenatal class. Begin classes no later than at the start of month 6 of your pregnancy.  Ask for help if you need counseling or if you need help with nutrition. Your doctor can give you advice or tell you where to go for help.  Do not use hot tubs, steam rooms, or   saunas.  Do not douche or use tampons or scented sanitary pads.  Do not cross your legs for long periods of time.  Avoid all herbs and alcohol. Avoid drugs that are not approved by your doctor.  Do not use any tobacco products, including cigarettes, chewing tobacco, and electronic cigarettes. If you need help quitting, ask your doctor. You may get counseling or other support to help you quit.  Avoid cat litter boxes and soil used by cats. These carry germs that can cause birth defects in the baby and can cause a loss of your baby (miscarriage) or stillbirth.  Visit your dentist. At home, brush  your teeth with a soft toothbrush. Be gentle when you floss. Contact a doctor if:  You are dizzy.  You have mild cramps or pressure in your lower belly.  You have a nagging pain in your belly area.  You continue to feel sick to your stomach, you throw up, or you have watery poop (diarrhea).  You have a bad smelling fluid coming from your vagina.  You have pain when you pee (urinate).  You have increased puffiness (swelling) in your face, hands, legs, or ankles. Get help right away if:  You have a fever.  You are leaking fluid from your vagina.  You have spotting or bleeding from your vagina.  You have very bad belly cramping or pain.  You gain or lose weight rapidly.  You throw up blood. It may look like coffee grounds.  You are around people who have German measles, fifth disease, or chickenpox.  You have a very bad headache.  You have shortness of breath.  You have any kind of trauma, such as from a fall or a car accident. Summary  The first trimester of pregnancy is from week 1 until the end of week 13 (months 1 through 3).  To take care of yourself and your unborn baby, you will need to eat healthy meals, take medicines only if your doctor tells you to do so, and do activities that are safe for you and your baby.  Keep all follow-up visits as told by your doctor. This is important as your doctor will have to ensure that your baby is healthy and growing well. This information is not intended to replace advice given to you by your health care provider. Make sure you discuss any questions you have with your health care provider. Document Released: 03/06/2008 Document Revised: 09/26/2016 Document Reviewed: 09/26/2016 Elsevier Interactive Patient Education  2017 [employer]  

## 2018-07-19 NOTE — Progress Notes (Signed)
   PRENATAL VISIT NOTE  Subjective:  [name] is a 32 y.o. G1P0 at [redacted]w[redacted]d being seen today for ongoing prenatal care.  She is currently monitored for the following issues for this high-risk pregnancy and has BMI 33.0-33.9,adult; Chronic hypertension affecting pregnancy; Type 2 diabetes mellitus during pregnancy; Supervision of high risk pregnancy, antepartum, first trimester; Proteinuria affecting pregnancy; and Pap smear abnormality of cervix/human papillomavirus (HPV) positive on their problem list.  Patient reports no complaints.  Contractions: Not present. Vag. Bleeding: None.  Movement: Absent. Denies leaking of fluid.   The following portions of the patient's history were reviewed and updated as appropriate: allergies, current medications, past family history, past medical history, past social history, past surgical history and problem list. Problem list updated.  Objective:   Vitals:   07/19/18 0958  BP: 126/79  Pulse: (!) 113  Weight: 217 lb 1.3 oz (98.5 kg)    Fetal Status:     Movement: Absent     General:  Alert, oriented and cooperative. Patient is in no acute distress.  Skin: Skin is warm and dry. No rash noted.   Cardiovascular: Normal heart rate noted  Respiratory: Normal respiratory effort, no problems with respiration noted  Abdomen: Soft, gravid, appropriate for gestational age.  Pain/Pressure: Absent     Pelvic: Cervical exam deferred        Extremities: Normal range of motion.  Edema: None  Mental Status: Normal mood and affect. Normal behavior. Normal judgment and thought content.   Blood Sugars      Assessment and Plan:  Pregnancy: G1P0 at [redacted]w[redacted]d  1. Pregnancy with type 2 diabetes mellitus in first trimester Elevated BS, will increase Metformin to 1000 mg po bid.  No changes to insulin for now, reevaluate in 2 weeks. Anatomy scan and fetal ECHO scheduled. ASA prescribed. Normal eye exam within the year.  - [place] MFM OB DETAIL +14 WK; Future - US Fetal  Echocardiography; Future - metFORMIN (GLUCOPHAGE) 1000 MG tablet; Take 1 tablet (1,000 mg total) by mouth 2 (two) times daily with a meal.  Dispense: 180 tablet; Refill: 3 - aspirin EC 81 MG tablet; Take 1 tablet (81 mg total) by mouth daily. Take after 12 weeks for prevention of preeclampsia later in pregnancy  Dispense: 300 tablet; Refill: 2  2. Chronic hypertension affecting pregnancy Stable BP today, will start ASA today. - [place] MFM OB DETAIL +14 WK; Future - aspirin EC 81 MG tablet; Take 1 tablet (81 mg total) by mouth daily. Take after 12 weeks for prevention of preeclampsia later in pregnancy  Dispense: 300 tablet; Refill: 2  3. Supervision of high risk pregnancy, antepartum, first trimester Flu vaccine given. NIPS results pending. - Flu Vaccine QUAD 36+ mos IM No other complaints or concerns.  Routine obstetric precautions reviewed. Please refer to After Visit Summary for other counseling recommendations.  Return in about 2 weeks (around 08/02/2018) for OB Visit with Dr. Adrian Blackwater.  Future Appointments  Date Time Provider Department Center  08/01/2018  2:45 PM Levie Heritage, DO [facility] None  08/27/2018  2:00 PM [facility] [place] 3 [facility]    Jaynie Collins, MD

## 2018-07-22 ENCOUNTER — Encounter: Payer: BLUE CROSS/BLUE SHIELD | Admitting: Obstetrics & Gynecology

## 2018-08-01 ENCOUNTER — Ambulatory Visit (INDEPENDENT_AMBULATORY_CARE_PROVIDER_SITE_OTHER): Payer: BLUE CROSS/BLUE SHIELD | Admitting: Family Medicine

## 2018-08-01 VITALS — BP 126/81 | HR 106 | Wt 216.0 lb

## 2018-08-01 DIAGNOSIS — R87618 Other abnormal cytological findings on specimens from cervix uteri: Secondary | ICD-10-CM

## 2018-08-01 DIAGNOSIS — O0992 Supervision of high risk pregnancy, unspecified, second trimester: Secondary | ICD-10-CM

## 2018-08-01 DIAGNOSIS — O24111 Pre-existing diabetes mellitus, type 2, in pregnancy, first trimester: Secondary | ICD-10-CM

## 2018-08-01 DIAGNOSIS — O10912 Unspecified pre-existing hypertension complicating pregnancy, second trimester: Secondary | ICD-10-CM

## 2018-08-01 DIAGNOSIS — O10919 Unspecified pre-existing hypertension complicating pregnancy, unspecified trimester: Secondary | ICD-10-CM

## 2018-08-01 DIAGNOSIS — R8789 Other abnormal findings in specimens from female genital organs: Secondary | ICD-10-CM

## 2018-08-01 DIAGNOSIS — O0991 Supervision of high risk pregnancy, unspecified, first trimester: Secondary | ICD-10-CM

## 2018-08-01 DIAGNOSIS — O24112 Pre-existing diabetes mellitus, type 2, in pregnancy, second trimester: Secondary | ICD-10-CM

## 2018-08-01 DIAGNOSIS — E1165 Type 2 diabetes mellitus with hyperglycemia: Secondary | ICD-10-CM

## 2018-08-01 DIAGNOSIS — Z6833 Body mass index (BMI) 33.0-33.9, adult: Secondary | ICD-10-CM

## 2018-08-01 LAB — POCT URINALYSIS DIPSTICK OB
Blood, UA: NEGATIVE
Glucose, UA: NEGATIVE
Ketones, UA: NEGATIVE
LEUKOCYTES UA: NEGATIVE
Nitrite, UA: NEGATIVE
PROTEIN: NEGATIVE

## 2018-08-01 MED ORDER — INSULIN DETEMIR 100 UNIT/ML FLEXPEN: 40.0000 [IU] | PEN_INJECTOR | Freq: Two times a day (BID) | SUBCUTANEOUS | 11 refills | Status: DC

## 2018-08-01 MED ORDER — METFORMIN HCL 1000 MG PO TABS: 500.0000 mg | ORAL_TABLET | Freq: Two times a day (BID) | ORAL | 3 refills | Status: DC

## 2018-08-01 NOTE — Progress Notes (Signed)
Subjective:  [name] is a 32 y.o. G1P0 at [redacted]w[redacted]d being seen today for ongoing prenatal care.  She is currently monitored for the following issues for this high-risk pregnancy and has BMI 33.0-33.9,adult; Chronic hypertension affecting pregnancy; Type 2 diabetes mellitus during pregnancy; Supervision of high risk pregnancy, antepartum, first trimester; Proteinuria affecting pregnancy; and Pap smear abnormality of cervix/human papillomavirus (HPV) positive on their problem list.  GDM: Patient taking metformin 1000mg  BID, levemir 30units BID, humalog 10 units AC.  Reports no hypoglycemic episodes.  Reporting diarrhea with increased dose of metformin Fasting: controlled 1hr PP: 4-5 elevated values.  Patient reports no complaints.  Contractions: Not present. Vag. Bleeding: None.  Movement: Present. Denies leaking of fluid.   The following portions of the patient's history were reviewed and updated as appropriate: allergies, current medications, past family history, past medical history, past social history, past surgical history and problem list. Problem list updated.  Objective:   Vitals:   08/01/18 1421  BP: 126/81  Pulse: (!) 106  Weight: 216 lb (98 kg)    Fetal Status: Fetal Heart Rate (bpm): 160   Movement: Present     General:  Alert, oriented and cooperative. Patient is in no acute distress.  Skin: Skin is warm and dry. No rash noted.   Cardiovascular: Normal heart rate noted  Respiratory: Normal respiratory effort, no problems with respiration noted  Abdomen: Soft, gravid, appropriate for gestational age. Pain/Pressure: Absent     Pelvic: Vag. Bleeding: None     Cervical exam deferred        Extremities: Normal range of motion.  Edema: None  Mental Status: Normal mood and affect. Normal behavior. Normal judgment and thought content.   Urinalysis:      Assessment and Plan:  Pregnancy: G1P0 at [redacted]w[redacted]d  1. Supervision of high risk pregnancy, antepartum, first trimester FHT  and FH normal  2. Chronic hypertension affecting pregnancy BP controlled.  3. Uncontrolled type 2 diabetes mellitus with hyperglycemia (HCC) Reduce metformin to 500mg  BID. Increase levemir to 40 units bid. Patient to message if CBGs consistently elevated - POC Urinalysis Dipstick OB - Insulin Detemir (LEVEMIR) 100 UNIT/ML Pen; Inject 40 Units into the skin 2 (two) times daily.  Dispense: 15 mL; Refill: 11  4. BMI 33.0-33.9,adult  5. Pap smear abnormality of cervix/human papillomavirus (HPV) positive   6. Pregnancy with type 2 diabetes mellitus in first trimester  - metFORMIN (GLUCOPHAGE) 1000 MG tablet; Take 0.5 tablets (500 mg total) by mouth 2 (two) times daily with a meal.  Dispense: 180 tablet; Refill: 3  Preterm labor symptoms and general obstetric precautions including but not limited to vaginal bleeding, contractions, leaking of fluid and fetal movement were reviewed in detail with the patient. Please refer to After Visit Summary for other counseling recommendations.  No follow-ups on file.   Levie Heritage, DO

## 2018-08-19 ENCOUNTER — Encounter (HOSPITAL_COMMUNITY): Payer: Self-pay

## 2018-08-22 ENCOUNTER — Ambulatory Visit (INDEPENDENT_AMBULATORY_CARE_PROVIDER_SITE_OTHER): Payer: BLUE CROSS/BLUE SHIELD | Admitting: Family Medicine

## 2018-08-22 ENCOUNTER — Encounter: Payer: Self-pay | Admitting: Family Medicine

## 2018-08-22 VITALS — BP 137/77 | HR 103 | Wt 222.0 lb

## 2018-08-22 DIAGNOSIS — O0992 Supervision of high risk pregnancy, unspecified, second trimester: Secondary | ICD-10-CM

## 2018-08-22 DIAGNOSIS — O10912 Unspecified pre-existing hypertension complicating pregnancy, second trimester: Secondary | ICD-10-CM

## 2018-08-22 DIAGNOSIS — O10919 Unspecified pre-existing hypertension complicating pregnancy, unspecified trimester: Secondary | ICD-10-CM

## 2018-08-22 DIAGNOSIS — O0991 Supervision of high risk pregnancy, unspecified, first trimester: Secondary | ICD-10-CM

## 2018-08-22 DIAGNOSIS — E1165 Type 2 diabetes mellitus with hyperglycemia: Secondary | ICD-10-CM

## 2018-08-22 NOTE — Progress Notes (Signed)
   PRENATAL VISIT NOTE  Subjective:  [name] is a 32 y.o. G1P0 at 6012w4d being seen today for ongoing prenatal care.  She is currently monitored for the following issues for this high-risk pregnancy and has BMI 33.0-33.9,adult; Chronic hypertension affecting pregnancy; Type 2 diabetes mellitus during pregnancy; Supervision of high risk pregnancy, antepartum, first trimester; Proteinuria affecting pregnancy; and Pap smear abnormality of cervix/human papillomavirus (HPV) positive on their problem list.  Patient reports nasal congestion.  Contractions: Not present. Vag. Bleeding: None.  Movement: Present. Denies leaking of fluid.   The following portions of the patient's history were reviewed and updated as appropriate: allergies, current medications, past family history, past medical history, past social history, past surgical history and problem list. Problem list updated.  Objective:   Vitals:   08/22/18 1416  BP: 137/77  Pulse: (!) 103  Weight: 222 lb (100.7 kg)    Fetal Status: Fetal Heart Rate (bpm): 154   Movement: Present     General:  Alert, oriented and cooperative. Patient is in no acute distress.  Skin: Skin is warm and dry. No rash noted.   Cardiovascular: Normal heart rate noted  Respiratory: Normal respiratory effort, no problems with respiration noted  Abdomen: Soft, gravid, appropriate for gestational age.  Pain/Pressure: Absent     Pelvic: Cervical exam deferred        Extremities: Normal range of motion.  Edema: None  Mental Status: Normal mood and affect. Normal behavior. Normal judgment and thought content.   Assessment and Plan:  Pregnancy: G1P0 at 9512w4d  1. Supervision of high risk pregnancy, antepartum, first trimester FHT and FH normal - POC Urinalysis Dipstick OB  2. Chronic hypertension affecting pregnancy BP controlled. Continue daily ASA 81mg  - POC Urinalysis Dipstick OB  3. Uncontrolled type 2 diabetes mellitus with hyperglycemia (HCC) Didn't  bring logbook, but reports CBGs controlled Will send us a copy via MyChart when she gets home. Continue metformin 500mg  BID Levemir 40units BID lipsro 10 units with meals   Preterm labor symptoms and general obstetric precautions including but not limited to vaginal bleeding, contractions, leaking of fluid and fetal movement were reviewed in detail with the patient. Please refer to After Visit Summary for other counseling recommendations.  Return in about 4 weeks (around 09/19/2018) for OB f/u.  Future Appointments  Date Time Provider Department Center  08/27/2018  2:00 PM [facility] US 3 [facility]    Levie HeritageJacob J Dorrian Doggett, DO

## 2018-08-26 ENCOUNTER — Ambulatory Visit (INDEPENDENT_AMBULATORY_CARE_PROVIDER_SITE_OTHER): Payer: BLUE CROSS/BLUE SHIELD | Admitting: Obstetrics & Gynecology

## 2018-08-26 ENCOUNTER — Encounter: Payer: Self-pay | Admitting: Obstetrics & Gynecology

## 2018-08-26 VITALS — BP 148/88 | HR 109 | Wt 221.0 lb

## 2018-08-26 DIAGNOSIS — O24112 Pre-existing diabetes mellitus, type 2, in pregnancy, second trimester: Secondary | ICD-10-CM

## 2018-08-26 DIAGNOSIS — O10919 Unspecified pre-existing hypertension complicating pregnancy, unspecified trimester: Secondary | ICD-10-CM

## 2018-08-26 DIAGNOSIS — O1210 Gestational proteinuria, unspecified trimester: Secondary | ICD-10-CM

## 2018-08-26 DIAGNOSIS — Z3A19 19 weeks gestation of pregnancy: Secondary | ICD-10-CM

## 2018-08-26 DIAGNOSIS — R8789 Other abnormal findings in specimens from female genital organs: Secondary | ICD-10-CM

## 2018-08-26 DIAGNOSIS — R87618 Other abnormal cytological findings on specimens from cervix uteri: Secondary | ICD-10-CM

## 2018-08-26 DIAGNOSIS — O10912 Unspecified pre-existing hypertension complicating pregnancy, second trimester: Secondary | ICD-10-CM

## 2018-08-26 DIAGNOSIS — O0992 Supervision of high risk pregnancy, unspecified, second trimester: Secondary | ICD-10-CM

## 2018-08-26 DIAGNOSIS — O1212 Gestational proteinuria, second trimester: Secondary | ICD-10-CM

## 2018-08-26 DIAGNOSIS — Z6833 Body mass index (BMI) 33.0-33.9, adult: Secondary | ICD-10-CM

## 2018-08-26 DIAGNOSIS — O0991 Supervision of high risk pregnancy, unspecified, first trimester: Secondary | ICD-10-CM

## 2018-08-26 NOTE — Progress Notes (Signed)
   PRENATAL VISIT NOTE  Subjective:  [name] is a 32 y.o. G1P0 at 6544w1d being seen today for ongoing prenatal care.  She is currently monitored for the following issues for this high-risk pregnancy and has BMI 33.0-33.9,adult; Chronic hypertension affecting pregnancy; Type 2 diabetes mellitus during pregnancy; Supervision of high risk pregnancy, antepartum, first trimester; Proteinuria affecting pregnancy; and Pap smear abnormality of cervix/human papillomavirus (HPV) positive on their problem list.  Patient reports pt with pain in her back overnight x1 episode. .  .  Contractions: Not present. Vag. Bleeding: None.  Movement: Present. Denies leaking of fluid.   The following portions of the patient's history were reviewed and updated as appropriate: allergies, current medications, past family history, past medical history, past social history, past surgical history and problem list. Problem list updated.  Objective:   Vitals:   08/26/18 1024  BP: (!) 148/88  Pulse: (!) 109  Weight: 221 lb (100.2 kg)    Fetal Status: Fetal Heart Rate (bpm): 158   Movement: Present     General:  Alert, oriented and cooperative. Patient is in no acute distress.  Skin: Skin is warm and dry. No rash noted.   Cardiovascular: Normal heart rate noted  Respiratory: Normal respiratory effort, no problems with respiration noted  Abdomen: Soft, gravid, appropriate for gestational age.  Pain/Pressure: Present     Pelvic: Cervical exam deferred        Extremities: Normal range of motion.  Edema: None  Mental Status: Normal mood and affect. Normal behavior. Normal judgment and thought content.   Assessment and Plan:  Pregnancy: G1P0 at 6144w1d  1. Supervision of high risk pregnancy, antepartum, first trimester  2. Pregnancy with type 2 diabetes mellitus in second trimester On meds on Insulin and metformin  3. Proteinuria affecting pregnancy, antepartum  4. Pap smear abnormality of cervix/human  papillomavirus (HPV) positive Repeat in 1 year.   5. Chronic hypertension affecting pregnancy Slightly elevated. No meds.  6. BMI 33.0-33.9,adult  Preterm labor symptoms and general obstetric precautions including but not limited to vaginal bleeding, contractions, leaking of fluid and fetal movement were reviewed in detail with the patient. Please refer to After Visit Summary for other counseling recommendations.  Return in about 4 weeks (around 09/23/2018).  Future Appointments  Date Time Provider Department Center  08/27/2018  2:00 PM [facility] US 3 [facility]  09/18/2018  2:30 PM Willodean RosenthalHarraway-Smith, Jerardo Costabile, MD [facility] None    Willodean Rosenthalarolyn Harraway-Smith, MD

## 2018-08-26 NOTE — Progress Notes (Signed)
Patient complaining of back pain that radiates around to front of belly. Patient first noted pain"last night". Armandina StammerJennifer  RN

## 2018-08-27 ENCOUNTER — Ambulatory Visit (HOSPITAL_COMMUNITY)
Admission: RE | Admit: 2018-08-27 | Discharge: 2018-08-27 | Disposition: A | Discharge status: Home / Self Care | Payer: BLUE CROSS/BLUE SHIELD | Source: Ambulatory Visit | Attending: Obstetrics & Gynecology | Admitting: Obstetrics & Gynecology

## 2018-08-27 ENCOUNTER — Encounter (HOSPITAL_COMMUNITY): Payer: Self-pay

## 2018-08-27 DIAGNOSIS — Z3A19 19 weeks gestation of pregnancy: Secondary | ICD-10-CM

## 2018-08-27 DIAGNOSIS — O10013 Pre-existing essential hypertension complicating pregnancy, third trimester: Secondary | ICD-10-CM | POA: Diagnosis not present

## 2018-08-27 DIAGNOSIS — O0992 Supervision of high risk pregnancy, unspecified, second trimester: Secondary | ICD-10-CM | POA: Insufficient documentation

## 2018-08-27 DIAGNOSIS — O24111 Pre-existing diabetes mellitus, type 2, in pregnancy, first trimester: Secondary | ICD-10-CM

## 2018-08-27 DIAGNOSIS — O10919 Unspecified pre-existing hypertension complicating pregnancy, unspecified trimester: Secondary | ICD-10-CM

## 2018-08-27 DIAGNOSIS — O99212 Obesity complicating pregnancy, second trimester: Secondary | ICD-10-CM | POA: Diagnosis not present

## 2018-08-27 DIAGNOSIS — O10912 Unspecified pre-existing hypertension complicating pregnancy, second trimester: Secondary | ICD-10-CM | POA: Diagnosis not present

## 2018-08-27 DIAGNOSIS — O0991 Supervision of high risk pregnancy, unspecified, first trimester: Secondary | ICD-10-CM

## 2018-08-27 DIAGNOSIS — O24112 Pre-existing diabetes mellitus, type 2, in pregnancy, second trimester: Secondary | ICD-10-CM | POA: Insufficient documentation

## 2018-08-27 DIAGNOSIS — O24113 Pre-existing diabetes mellitus, type 2, in pregnancy, third trimester: Secondary | ICD-10-CM

## 2018-08-27 DIAGNOSIS — Z363 Encounter for antenatal screening for malformations: Secondary | ICD-10-CM | POA: Diagnosis not present

## 2018-08-27 HISTORY — DX: Essential (primary) hypertension: I10

## 2018-08-28 ENCOUNTER — Other Ambulatory Visit (HOSPITAL_COMMUNITY): Payer: Self-pay | Admitting: *Deleted

## 2018-08-28 DIAGNOSIS — Z362 Encounter for other antenatal screening follow-up: Secondary | ICD-10-CM

## 2018-09-05 DIAGNOSIS — O24410 Gestational diabetes mellitus in pregnancy, diet controlled: Secondary | ICD-10-CM | POA: Diagnosis not present

## 2018-09-07 DIAGNOSIS — R112 Nausea with vomiting, unspecified: Secondary | ICD-10-CM | POA: Diagnosis not present

## 2018-09-07 DIAGNOSIS — R1110 Vomiting, unspecified: Secondary | ICD-10-CM | POA: Diagnosis not present

## 2018-09-18 ENCOUNTER — Ambulatory Visit (INDEPENDENT_AMBULATORY_CARE_PROVIDER_SITE_OTHER): Payer: BLUE CROSS/BLUE SHIELD | Admitting: Obstetrics & Gynecology

## 2018-09-18 ENCOUNTER — Encounter: Payer: Self-pay | Admitting: Obstetrics & Gynecology

## 2018-09-18 VITALS — BP 153/89 | HR 114 | Wt 222.0 lb

## 2018-09-18 DIAGNOSIS — O0992 Supervision of high risk pregnancy, unspecified, second trimester: Secondary | ICD-10-CM

## 2018-09-18 DIAGNOSIS — O24112 Pre-existing diabetes mellitus, type 2, in pregnancy, second trimester: Secondary | ICD-10-CM

## 2018-09-18 DIAGNOSIS — Z6833 Body mass index (BMI) 33.0-33.9, adult: Secondary | ICD-10-CM

## 2018-09-18 DIAGNOSIS — O1210 Gestational proteinuria, unspecified trimester: Secondary | ICD-10-CM

## 2018-09-18 DIAGNOSIS — O24119 Pre-existing diabetes mellitus, type 2, in pregnancy, unspecified trimester: Secondary | ICD-10-CM

## 2018-09-18 DIAGNOSIS — O10919 Unspecified pre-existing hypertension complicating pregnancy, unspecified trimester: Secondary | ICD-10-CM

## 2018-09-18 DIAGNOSIS — O1212 Gestational proteinuria, second trimester: Secondary | ICD-10-CM

## 2018-09-18 DIAGNOSIS — O10912 Unspecified pre-existing hypertension complicating pregnancy, second trimester: Secondary | ICD-10-CM

## 2018-09-18 DIAGNOSIS — O0991 Supervision of high risk pregnancy, unspecified, first trimester: Secondary | ICD-10-CM

## 2018-09-18 NOTE — Progress Notes (Signed)
   PRENATAL VISIT NOTE  Subjective:  [name] is a 32 y.o. G1P0 at 5952w3d being seen today for ongoing prenatal care.  She is currently monitored for the following issues for this high-risk pregnancy and has BMI 33.0-33.9,adult; Chronic hypertension affecting pregnancy; Type 2 diabetes mellitus during pregnancy; Supervision of high risk pregnancy, antepartum, first trimester; Proteinuria affecting pregnancy; and Pap smear abnormality of cervix/human papillomavirus (HPV) positive on their problem list.  Patient reports nasal congestion..  Contractions: Not present. Vag. Bleeding: None.  Movement: Present. Denies leaking of fluid.   The following portions of the patient's history were reviewed and updated as appropriate: allergies, current medications, past family history, past medical history, past social history, past surgical history and problem list. Problem list updated.  Objective:   Vitals:   09/18/18 1444  BP: (!) 153/89  Pulse: (!) 114  Weight: 222 lb (100.7 kg)    Fetal Status: Fetal Heart Rate (bpm): 140   Movement: Present     General:  Alert, oriented and cooperative. Patient is in no acute distress.  Skin: Skin is warm and dry. No rash noted.   Cardiovascular: Normal heart rate noted  Respiratory: Normal respiratory effort, no problems with respiration noted  Abdomen: Soft, gravid, appropriate for gestational age.  Pain/Pressure: Present     Pelvic: Cervical exam deferred        Extremities: Normal range of motion.  Edema: None  Mental Status: Normal mood and affect. Normal behavior. Normal judgment and thought content.   Assessment and Plan:  Pregnancy: G1P0 at 5752w3d  1. Supervision of high risk pregnancy, antepartum, first trimester  2. Type 2 diabetes mellitus during pregnancy, antepartum Glucose levels appear to be elevated. I suspect that her fasting FGS are elevated.  She forgot her log today.    Reviewed holiday eating traps Pt to f/u in 2 weeks with her  glc log.  Keep current meds NPH bid Reg 10u after each meal    3. Proteinuria affecting pregnancy, antepartum  4. Chronic hypertension affecting pregnancy No antihypertensives    5. BMI 33.0-33.9,adult   Preterm labor symptoms and general obstetric precautions including but not limited to vaginal bleeding, contractions, leaking of fluid and fetal movement were reviewed in detail with the patient. Please refer to After Visit Summary for other counseling recommendations.  Return in about 4 weeks (around 10/16/2018).  Future Appointments  Date Time Provider Department Center  09/20/2018  2:30 PM [facility] US 1 [facility]    Willodean Rosenthalarolyn Harraway-Smith, MD

## 2018-09-20 ENCOUNTER — Ambulatory Visit (HOSPITAL_COMMUNITY)
Admission: RE | Admit: 2018-09-20 | Discharge: 2018-09-20 | Disposition: A | Discharge status: Home / Self Care | Payer: BLUE CROSS/BLUE SHIELD | Source: Ambulatory Visit | Attending: Family Medicine | Admitting: Family Medicine

## 2018-09-20 ENCOUNTER — Encounter (HOSPITAL_COMMUNITY): Payer: Self-pay

## 2018-09-20 DIAGNOSIS — O24112 Pre-existing diabetes mellitus, type 2, in pregnancy, second trimester: Secondary | ICD-10-CM

## 2018-09-20 DIAGNOSIS — O10012 Pre-existing essential hypertension complicating pregnancy, second trimester: Secondary | ICD-10-CM | POA: Diagnosis not present

## 2018-09-20 DIAGNOSIS — O0991 Supervision of high risk pregnancy, unspecified, first trimester: Secondary | ICD-10-CM

## 2018-09-20 DIAGNOSIS — O99212 Obesity complicating pregnancy, second trimester: Secondary | ICD-10-CM | POA: Diagnosis not present

## 2018-09-20 DIAGNOSIS — Z3A22 22 weeks gestation of pregnancy: Secondary | ICD-10-CM

## 2018-09-20 DIAGNOSIS — O10919 Unspecified pre-existing hypertension complicating pregnancy, unspecified trimester: Secondary | ICD-10-CM

## 2018-09-20 DIAGNOSIS — Z362 Encounter for other antenatal screening follow-up: Secondary | ICD-10-CM | POA: Diagnosis not present

## 2018-09-23 ENCOUNTER — Other Ambulatory Visit (HOSPITAL_COMMUNITY): Payer: Self-pay | Admitting: *Deleted

## 2018-09-23 DIAGNOSIS — O24312 Unspecified pre-existing diabetes mellitus in pregnancy, second trimester: Principal | ICD-10-CM

## 2018-09-23 DIAGNOSIS — IMO0001 Reserved for inherently not codable concepts without codable children: Secondary | ICD-10-CM

## 2018-09-23 DIAGNOSIS — Z794 Long term (current) use of insulin: Principal | ICD-10-CM

## 2018-09-24 ENCOUNTER — Ambulatory Visit (HOSPITAL_COMMUNITY): Payer: BLUE CROSS/BLUE SHIELD

## 2018-09-24 ENCOUNTER — Encounter (HOSPITAL_COMMUNITY): Payer: Self-pay

## 2018-10-04 ENCOUNTER — Ambulatory Visit (INDEPENDENT_AMBULATORY_CARE_PROVIDER_SITE_OTHER): Payer: BLUE CROSS/BLUE SHIELD | Admitting: Family Medicine

## 2018-10-04 VITALS — BP 130/78 | HR 105 | Wt 222.0 lb

## 2018-10-04 DIAGNOSIS — O10919 Unspecified pre-existing hypertension complicating pregnancy, unspecified trimester: Secondary | ICD-10-CM

## 2018-10-04 DIAGNOSIS — O24112 Pre-existing diabetes mellitus, type 2, in pregnancy, second trimester: Secondary | ICD-10-CM

## 2018-10-04 DIAGNOSIS — E1165 Type 2 diabetes mellitus with hyperglycemia: Secondary | ICD-10-CM

## 2018-10-04 DIAGNOSIS — O0992 Supervision of high risk pregnancy, unspecified, second trimester: Secondary | ICD-10-CM

## 2018-10-04 DIAGNOSIS — O0991 Supervision of high risk pregnancy, unspecified, first trimester: Secondary | ICD-10-CM

## 2018-10-04 DIAGNOSIS — O10912 Unspecified pre-existing hypertension complicating pregnancy, second trimester: Secondary | ICD-10-CM

## 2018-10-04 LAB — POCT URINALYSIS DIPSTICK OB: POC,PROTEIN,UA: NEGATIVE

## 2018-10-04 MED ORDER — INSULIN LISPRO 200 UNIT/ML ~~LOC~~ SOPN: 10.0000 [IU] | PEN_INJECTOR | Freq: Three times a day (TID) | SUBCUTANEOUS | 0 refills | Status: DC

## 2018-10-04 NOTE — Progress Notes (Signed)
   PRENATAL VISIT NOTE  Subjective:  [name] is a 33 y.o. G1P0 at 3698w5d being seen today for ongoing prenatal care.  She is currently monitored for the following issues for this high-risk pregnancy and has BMI 33.0-33.9,adult; Chronic hypertension affecting pregnancy; Type 2 diabetes mellitus during pregnancy; Supervision of high risk pregnancy, antepartum, first trimester; Proteinuria affecting pregnancy; and Pap smear abnormality of cervix/human papillomavirus (HPV) positive on their problem list.  Patient reports no complaints.  Contractions: Not present. Vag. Bleeding: None.  Movement: Present. Denies leaking of fluid.   The following portions of the patient's history were reviewed and updated as appropriate: allergies, current medications, past family history, past medical history, past social history, past surgical history and problem list. Problem list updated.  Objective:   Vitals:   10/04/18 0937  BP: 130/78  Pulse: (!) 105  Weight: 222 lb (100.7 kg)    Fetal Status: Fetal Heart Rate (bpm): 155 Fundal Height: 26 cm Movement: Present     General:  Alert, oriented and cooperative. Patient is in no acute distress.  Skin: Skin is warm and dry. No rash noted.   Cardiovascular: Normal heart rate noted  Respiratory: Normal respiratory effort, no problems with respiration noted  Abdomen: Soft, gravid, appropriate for gestational age.  Pain/Pressure: Present     Pelvic: Cervical exam deferred        Extremities: Normal range of motion.  Edema: None  Mental Status: Normal mood and affect. Normal behavior. Normal judgment and thought content.   Assessment and Plan:  Pregnancy: G1P0 at 198w5d  1. Chronic hypertension affecting pregnancy BP is ok today Growth last month at 61%-->f/u in 4 wks - POC Urinalysis Dipstick OB  2. Pregnancy with type 2 diabetes mellitus in second trimester CBGs reviewed today--fastings are in the 70-116--almost all in range 2 hour pp are in the  90-157. Evening meals are mostly out. Will increase pm Humalog to 12 u. If still elevated will consider increasing am Levemir to 42. Normal echo Serial u/s for growth and antepartum testing. Discussed delivery plans and risks of pre-eclampsia today Fetal kick counts discussed - POC Urinalysis Dipstick OB  3. Supervision of high risk pregnancy, antepartum, first trimester Continue prenatal care.   Preterm labor symptoms and general obstetric precautions including but not limited to vaginal bleeding, contractions, leaking of fluid and fetal movement were reviewed in detail with the patient. Please refer to After Visit Summary for other counseling recommendations.  Return in 2 weeks (on 10/18/2018).  Future Appointments  Date Time Provider Department Center  10/17/2018  2:45 PM Levie HeritageStinson, Jacob J, DO [facility] None  10/18/2018  2:15 PM [facility] US 4 [facility]    Reva Boresanya S Vetta Couzens, MD

## 2018-10-04 NOTE — Patient Instructions (Signed)

## 2018-10-10 ENCOUNTER — Telehealth: Payer: Self-pay

## 2018-10-10 ENCOUNTER — Other Ambulatory Visit: Payer: Self-pay

## 2018-10-10 DIAGNOSIS — E1165 Type 2 diabetes mellitus with hyperglycemia: Secondary | ICD-10-CM

## 2018-10-10 NOTE — Telephone Encounter (Signed)
    [name] would like a refill of the following medications:    Insulin Lispro (HUMALOG KWIKPEN) 200 UNIT/ML SOPN Reva Bores, MD]   Preferred pharmacy: [id number] - [place], [place] - [address]      Patient requesting refill. Was just seen on 10/04/18 and not given any refills. Just want doctor to review since no refills originally given. [name] RN

## 2018-10-11 MED ORDER — INSULIN LISPRO 200 UNIT/ML ~~LOC~~ SOPN: 10.0000 [IU] | PEN_INJECTOR | Freq: Three times a day (TID) | SUBCUTANEOUS | 0 refills | Status: DC

## 2018-10-17 ENCOUNTER — Ambulatory Visit (INDEPENDENT_AMBULATORY_CARE_PROVIDER_SITE_OTHER): Payer: BLUE CROSS/BLUE SHIELD | Admitting: Family Medicine

## 2018-10-17 VITALS — BP 129/71 | HR 120 | Wt 222.0 lb

## 2018-10-17 DIAGNOSIS — E1165 Type 2 diabetes mellitus with hyperglycemia: Secondary | ICD-10-CM

## 2018-10-17 DIAGNOSIS — O0991 Supervision of high risk pregnancy, unspecified, first trimester: Secondary | ICD-10-CM

## 2018-10-17 DIAGNOSIS — O10919 Unspecified pre-existing hypertension complicating pregnancy, unspecified trimester: Secondary | ICD-10-CM

## 2018-10-17 DIAGNOSIS — O24119 Pre-existing diabetes mellitus, type 2, in pregnancy, unspecified trimester: Secondary | ICD-10-CM

## 2018-10-17 DIAGNOSIS — O0992 Supervision of high risk pregnancy, unspecified, second trimester: Secondary | ICD-10-CM

## 2018-10-17 DIAGNOSIS — O10912 Unspecified pre-existing hypertension complicating pregnancy, second trimester: Secondary | ICD-10-CM

## 2018-10-17 DIAGNOSIS — O24112 Pre-existing diabetes mellitus, type 2, in pregnancy, second trimester: Secondary | ICD-10-CM

## 2018-10-17 DIAGNOSIS — Z3A26 26 weeks gestation of pregnancy: Secondary | ICD-10-CM

## 2018-10-17 MED ORDER — INSULIN DETEMIR 100 UNIT/ML FLEXPEN: 40.0000 [IU] | PEN_INJECTOR | Freq: Two times a day (BID) | SUBCUTANEOUS | 11 refills | Status: DC

## 2018-10-17 NOTE — Progress Notes (Signed)
Subjective:  [name] is a 33 y.o. G1P0 at [redacted]w[redacted]d being seen today for ongoing prenatal care.  She is currently monitored for the following issues for this high-risk pregnancy and has BMI 33.0-33.9,adult; Chronic hypertension affecting pregnancy; Type 2 diabetes mellitus during pregnancy; Supervision of high risk pregnancy, antepartum, first trimester; Proteinuria affecting pregnancy; and Pap smear abnormality of cervix/human papillomavirus (HPV) positive on their problem list.  GDM: Patient taking insulin and metformin.  Reports no hypoglycemic episodes.  Tolerating medication well Fasting: 77-100 (3 elevated) 2hr PP: mostly controlled, some in the 120-130 range.  Patient reports no complaints.  Contractions: Not present. Vag. Bleeding: None.  Movement: Present. Denies leaking of fluid.   The following portions of the patient's history were reviewed and updated as appropriate: allergies, current medications, past family history, past medical history, past social history, past surgical history and problem list. Problem list updated.  Objective:   Vitals:   10/17/18 1441  BP: 129/71  Pulse: (!) 120  Weight: 222 lb (100.7 kg)    Fetal Status: Fetal Heart Rate (bpm): 155   Movement: Present     General:  Alert, oriented and cooperative. Patient is in no acute distress.  Skin: Skin is warm and dry. No rash noted.   Cardiovascular: Normal heart rate noted  Respiratory: Normal respiratory effort, no problems with respiration noted  Abdomen: Soft, gravid, appropriate for gestational age. Pain/Pressure: Present     Pelvic: Vag. Bleeding: None     Cervical exam deferred        Extremities: Normal range of motion.  Edema: None  Mental Status: Normal mood and affect. Normal behavior. Normal judgment and thought content.   Urinalysis:      Assessment and Plan:  Pregnancy: G1P0 at [redacted]w[redacted]d  1. Supervision of high risk pregnancy, antepartum, first trimester FHT and FH normal  2. Chronic  hypertension affecting pregnancy BP controlled  3. Type 2 diabetes mellitus during pregnancy, antepartum Increase AM levemir to 50 units.  4. Uncontrolled type 2 diabetes mellitus with hyperglycemia (HCC)  - Insulin Detemir (LEVEMIR) 100 UNIT/ML Pen; Inject 40-50 Units into the skin 2 (two) times daily. 50 in AM and 40 in qHS  Dispense: 15 mL; Refill: 11  Preterm labor symptoms and general obstetric precautions including but not limited to vaginal bleeding, contractions, leaking of fluid and fetal movement were reviewed in detail with the patient. Please refer to After Visit Summary for other counseling recommendations.  Return in about 2 weeks (around 10/31/2018) for OB f/u.   Levie Heritage, DO

## 2018-10-18 ENCOUNTER — Encounter (HOSPITAL_COMMUNITY): Payer: Self-pay

## 2018-10-18 ENCOUNTER — Other Ambulatory Visit (HOSPITAL_COMMUNITY): Payer: Self-pay | Admitting: *Deleted

## 2018-10-18 ENCOUNTER — Ambulatory Visit (HOSPITAL_COMMUNITY)
Admission: RE | Admit: 2018-10-18 | Discharge: 2018-10-18 | Disposition: A | Discharge status: Home / Self Care | Payer: BLUE CROSS/BLUE SHIELD | Source: Ambulatory Visit | Attending: Family Medicine | Admitting: Family Medicine

## 2018-10-18 DIAGNOSIS — Z794 Long term (current) use of insulin: Secondary | ICD-10-CM | POA: Insufficient documentation

## 2018-10-18 DIAGNOSIS — O10012 Pre-existing essential hypertension complicating pregnancy, second trimester: Secondary | ICD-10-CM | POA: Diagnosis not present

## 2018-10-18 DIAGNOSIS — O0991 Supervision of high risk pregnancy, unspecified, first trimester: Secondary | ICD-10-CM | POA: Diagnosis not present

## 2018-10-18 DIAGNOSIS — Z3A26 26 weeks gestation of pregnancy: Secondary | ICD-10-CM

## 2018-10-18 DIAGNOSIS — O24312 Unspecified pre-existing diabetes mellitus in pregnancy, second trimester: Secondary | ICD-10-CM | POA: Diagnosis not present

## 2018-10-18 DIAGNOSIS — O10919 Unspecified pre-existing hypertension complicating pregnancy, unspecified trimester: Secondary | ICD-10-CM

## 2018-10-18 DIAGNOSIS — IMO0001 Reserved for inherently not codable concepts without codable children: Secondary | ICD-10-CM

## 2018-10-18 DIAGNOSIS — Z363 Encounter for antenatal screening for malformations: Secondary | ICD-10-CM | POA: Diagnosis not present

## 2018-10-18 DIAGNOSIS — O24112 Pre-existing diabetes mellitus, type 2, in pregnancy, second trimester: Secondary | ICD-10-CM

## 2018-10-18 DIAGNOSIS — O99212 Obesity complicating pregnancy, second trimester: Secondary | ICD-10-CM

## 2018-10-21 ENCOUNTER — Telehealth: Payer: Self-pay

## 2018-10-21 DIAGNOSIS — E1165 Type 2 diabetes mellitus with hyperglycemia: Secondary | ICD-10-CM

## 2018-10-21 NOTE — Telephone Encounter (Signed)
Prior authorization denied for Levemir 100 unit/ ML pen.   Sent to Dr. Adrian Blackwater for review. [name] RN

## 2018-10-21 NOTE — Telephone Encounter (Signed)
Can the patient give [place] a number to the insurance company to call?

## 2018-10-22 ENCOUNTER — Inpatient Hospital Stay (HOSPITAL_COMMUNITY)
Admission: AD | Admit: 2018-10-22 | Discharge: 2018-10-22 | Disposition: A | Discharge status: Home / Self Care | Payer: BLUE CROSS/BLUE SHIELD | Attending: Family Medicine | Admitting: Family Medicine

## 2018-10-22 ENCOUNTER — Other Ambulatory Visit: Payer: Self-pay

## 2018-10-22 ENCOUNTER — Encounter (HOSPITAL_COMMUNITY): Payer: Self-pay

## 2018-10-22 DIAGNOSIS — M545 Low back pain, unspecified: Secondary | ICD-10-CM

## 2018-10-22 DIAGNOSIS — Z7982 Long term (current) use of aspirin: Secondary | ICD-10-CM | POA: Insufficient documentation

## 2018-10-22 DIAGNOSIS — Z87891 Personal history of nicotine dependence: Secondary | ICD-10-CM | POA: Diagnosis not present

## 2018-10-22 DIAGNOSIS — Z3A27 27 weeks gestation of pregnancy: Secondary | ICD-10-CM

## 2018-10-22 DIAGNOSIS — M9903 Segmental and somatic dysfunction of lumbar region: Secondary | ICD-10-CM | POA: Diagnosis not present

## 2018-10-22 DIAGNOSIS — O10912 Unspecified pre-existing hypertension complicating pregnancy, second trimester: Secondary | ICD-10-CM

## 2018-10-22 DIAGNOSIS — O26892 Other specified pregnancy related conditions, second trimester: Secondary | ICD-10-CM

## 2018-10-22 DIAGNOSIS — O0991 Supervision of high risk pregnancy, unspecified, first trimester: Secondary | ICD-10-CM

## 2018-10-22 DIAGNOSIS — M62830 Muscle spasm of back: Secondary | ICD-10-CM | POA: Insufficient documentation

## 2018-10-22 DIAGNOSIS — O10919 Unspecified pre-existing hypertension complicating pregnancy, unspecified trimester: Secondary | ICD-10-CM

## 2018-10-22 LAB — URINALYSIS, ROUTINE W REFLEX MICROSCOPIC
Bilirubin Urine: NEGATIVE
Glucose, UA: >=500 mg/dL — AB
Hgb urine dipstick: NEGATIVE
Ketones, ur: 5 mg/dL — AB
Leukocytes, UA: NEGATIVE
Nitrite: NEGATIVE
PH: 6 (ref 5.0–8.0)
Protein, ur: 100 mg/dL — AB
SPECIFIC GRAVITY, URINE: 1.012 (ref 1.005–1.030)

## 2018-10-22 MED ORDER — CYCLOBENZAPRINE HCL 10 MG PO TABS: 10.0000 mg | ORAL_TABLET | Freq: Three times a day (TID) | ORAL | 2 refills | Status: DC | PRN

## 2018-10-22 MED ORDER — INSULIN DETEMIR 100 UNIT/ML FLEXPEN: 40.0000 [IU] | PEN_INJECTOR | Freq: Two times a day (BID) | SUBCUTANEOUS | 10 refills | Status: DC

## 2018-10-22 NOTE — Telephone Encounter (Signed)
Patient insurance is [employer] with Public affairs [employer]  Provider services [phone] Pharmacy benefits [phone]  Also got permission from the patient to try and send into [facility] pharmacy to see if they can help in any way and to see the cost as patient prefers levemir and states it works best for her. [name] RN

## 2018-10-22 NOTE — MAU Provider Note (Signed)
History     CSN: [id number]  Arrival date and time: 10/22/18 1216   First Provider Initiated Contact with Patient 10/22/18 1426      Chief Complaint  Patient presents with  . Back Pain   HPI Patient is a G1P0 at 3873w2d seen for nonradiating, lower back pain that started last night and is constant. Left greater than right. Movement increases pain, sitting straight up helps. Hasn't tried any oral medication. Denies contractions, leaking fluid, numbness.   OB History    Gravida  1   Para      Term      Preterm      AB      Living  0     SAB      TAB      Ectopic      Multiple      Live Births              Past Medical History:  Diagnosis Date  . Diabetes mellitus without complication (HCC)   . Hypertension     Past Surgical History:  Procedure Laterality Date  . addnoids    . TONSILLECTOMY AND ADENOIDECTOMY      Family History  Problem Relation Age of Onset  . Diabetes Mother   . Hypertension Mother   . Hypertension Maternal Grandmother   . Diabetes Maternal Aunt   . Diabetes Maternal Uncle     Social History   Tobacco Use  . Smoking status: Former Smoker    Years: 0.00    Last attempt to quit: 03/30/2006    Years since quitting: 12.5  . Smokeless tobacco: Never Used  Substance Use Topics  . Alcohol use: No  . Drug use: No    Allergies:  Allergies  Allergen Reactions  . Almond (Diagnostic) Hives  . Bee Venom Hives    Medications Prior to Admission  Medication Sig Dispense Refill Last Dose  . acetaminophen (TYLENOL) 325 MG tablet Take 650 mg by mouth every 6 (six) hours as needed.   Taking  . aspirin EC 81 MG tablet Take 1 tablet (81 mg total) by mouth daily. Take after 12 weeks for prevention of preeclampsia later in pregnancy 300 tablet 2 Taking  . Insulin Detemir (LEVEMIR) 100 UNIT/ML Pen Inject 40-50 Units into the skin 2 (two) times daily. 50 in AM and 40 in qHS 15 mL 11 Taking  . Insulin Lispro (HUMALOG KWIKPEN) 200 UNIT/ML  SOPN Inject 10-12 Units into the skin 3 (three) times daily before meals. 12 u with pm meal 9 mL 0 Taking  . Insulin Pen Needle (PEN NEEDLES) 31G X 5 MM MISC 1 each by Does not apply route 5 (five) times daily as needed. 150 each 2 Taking  . metFORMIN (GLUCOPHAGE) 1000 MG tablet Take 0.5 tablets (500 mg total) by mouth 2 (two) times daily with a meal. 180 tablet 3 Taking  . metoCLOPramide (REGLAN) 10 MG tablet Take 10 mg by mouth 4 (four) times daily.   Taking  . omeprazole (PRILOSEC) 10 MG capsule Take 10 mg by mouth daily.   Taking  . Prenatal Multivit-Min-Fe-FA (PRENATAL VITAMINS PO) Take by mouth.   Taking    Review of Systems Physical Exam   Blood pressure (!) 133/94, pulse (!) 121, temperature 98.5 F (36.9 C), temperature source Oral, resp. rate 18, weight 104.3 kg, last menstrual period 04/05/2018, SpO2 100 %.  Physical Exam  Constitutional: She appears well-developed and well-nourished.  HENT:  Head: Normocephalic and  atraumatic.  Right Ear: External ear normal.  Left Ear: External ear normal.  Cardiovascular: Normal rate and regular rhythm.  Respiratory: Effort normal.  GI: Soft. Bowel sounds are normal. She exhibits no distension. There is no abdominal tenderness. There is no rebound.  Musculoskeletal:        General: Tenderness (pain L greater than right, lumbar paraspinal spasm.) present. No edema.  Skin: Skin is warm and dry.  Psychiatric: She has a normal mood and affect. Her behavior is normal. Judgment and thought content normal.   OSE: L5 ESRL, L/L torsion, right ant innom   MAU Course  Procedures OMT done after patient permission. HVLA technique utilized. 3 areas treated with improvement of tissue texture and joint mobility. Patient tolerated procedure well.    MDM   Assessment and Plan  1. Lumbar spasm in pregnancy 2. Lumbar somatic dysfunction OMT done - patient tolerated well Flexeril to pharmacy Ice/heat as needed. NST reassuring.  Levie HeritageJacob J  Stinson 10/22/2018, 2:28 PM

## 2018-10-22 NOTE — MAU Note (Signed)
Denies HA, visual changes, or epigastric pain, reports some increased swelling in left foot.

## 2018-10-22 NOTE — Addendum Note (Signed)
Addended by: Anell Barr on: 10/22/2018 03:49 PM   Modules accepted: Orders

## 2018-10-22 NOTE — MAU Note (Deleted)
Pt states the back pain started last night. Still hurting today. Did not take anything for pain.

## 2018-10-22 NOTE — Discharge Instructions (Signed)
Back Pain in Pregnancy  Back pain during pregnancy is common. Back pain may be caused by several factors that are related to changes during your pregnancy.  Follow these instructions at home:  Managing pain, stiffness, and swelling          If directed, for sudden (acute) back pain, put ice on the painful area.  ? Put ice in a plastic bag.  ? Place a towel between your skin and the bag.  ? Leave the ice on for 20 minutes, 2-3 times per day.   If directed, apply heat to the affected area before you exercise. Use the heat source that your health care provider recommends, such as a moist heat pack or a heating pad.  ? Place a towel between your skin and the heat source.  ? Leave the heat on for 20-30 minutes.  ? Remove the heat if your skin turns bright red. This is especially important if you are unable to feel pain, heat, or cold. You may have a greater risk of getting burned.   If directed, massage the affected area.  Activity   Exercise as told by your health care provider. Gentle exercise is the best way to prevent or manage back pain.   Listen to your body when lifting. If lifting hurts, ask for help or bend your knees. This uses your leg muscles instead of your back muscles.   Squat down when picking up something from the floor. Do not bend over.   Only use bed rest for short periods as told by your health care provider. Bed rest should only be used for the most severe episodes of back pain.  Standing, sitting, and lying down   Do not stand in one place for long periods of time.   Use good posture when sitting. Make sure your head rests over your shoulders and is not hanging forward. Use a pillow on your lower back if necessary.   Try sleeping on your side, preferably the left side, with a pregnancy support pillow or 1-2 regular pillows between your legs.  ? If you have back pain after a night's rest, your bed may be too soft.  ? A firm mattress may provide more support for your back during  pregnancy.  General instructions   Do not wear high heels.   Eat a healthy diet. Try to gain weight within your health care provider's recommendations.   Use a maternity girdle, elastic sling, or back brace as told by your health care provider.   Take over-the-counter and prescription medicines only as told by your health care provider.   Work with a physical therapist or massage therapist to find ways to manage back pain. Acupuncture or massage therapy may be helpful.   Keep all follow-up visits as told by your health care provider. This is important.  Contact a health care provider if:   Your back pain interferes with your daily activities.   You have increasing pain in other parts of your body.  Get help right away if:   You develop numbness, tingling, weakness, or problems with the use of your arms or legs.   You develop severe back pain that is not controlled with medicine.   You have a change in bowel or bladder control.   You develop shortness of breath, dizziness, or you faint.   You develop nausea, vomiting, or sweating.   You have back pain that is a rhythmic, cramping pain similar to labor pains. Labor   pain is usually 1-2 minutes apart, lasts for about 1 minute, and involves a bearing down feeling or pressure in your pelvis.   You have back pain and your water breaks or you have vaginal bleeding.   You have back pain or numbness that travels down your leg.   Your back pain developed after you fell.   You develop pain on one side of your back.   You see blood in your urine.   You develop skin blisters in the area of your back pain.  Summary   Back pain may be caused by several factors that are related to changes during your pregnancy.   Follow instructions as told by your health care provider for managing pain, stiffness, and swelling.   Exercise as told by your health care provider. Gentle exercise is the best way to prevent or manage back pain.   Take over-the-counter and  prescription medicines only as told by your health care provider.   Keep all follow-up visits as told by your health care provider. This is important.  This information is not intended to replace advice given to you by your health care provider. Make sure you discuss any questions you have with your health care provider.  Document Released: 12/27/2005 Document Revised: 03/06/2018 Document Reviewed: 03/06/2018  Elsevier Interactive Patient Education  2019 [employer]

## 2018-10-22 NOTE — MAU Note (Signed)
Since yesterday evening back has been hurting.  It stopped and then it started back about 0200. Pain comes and goes. Baby is very active.  Did not take anything for pain

## 2018-10-23 ENCOUNTER — Telehealth: Payer: Self-pay

## 2018-10-23 NOTE — Telephone Encounter (Signed)
Patient aware that PA has been completed  And approved for Levemir pen with [facility] pharmacy.  Patient given pharmacy number for her to call and get cost of pen. [name] RN

## 2018-10-23 NOTE — Telephone Encounter (Signed)
Patient called and states that she called pharmacy for pricing on Levemir will still be $344.00 out of pocket. Patient is unable to pay this cost.

## 2018-10-24 MED ORDER — INSULIN GLARGINE 100 UNIT/ML SOLOSTAR PEN: 40.0000 [IU] | PEN_INJECTOR | Freq: Every day | SUBCUTANEOUS | 11 refills | Status: DC

## 2018-10-24 NOTE — Telephone Encounter (Signed)
Changed to lantus. Please let pt know. Same doses in AM and PM

## 2018-10-24 NOTE — Telephone Encounter (Signed)
Left message for patient to return call to office to notify her of change in medication. [name] RN

## 2018-10-31 ENCOUNTER — Ambulatory Visit (INDEPENDENT_AMBULATORY_CARE_PROVIDER_SITE_OTHER): Payer: BLUE CROSS/BLUE SHIELD | Admitting: Family Medicine

## 2018-10-31 VITALS — BP 149/93 | HR 128 | Wt 234.0 lb

## 2018-10-31 DIAGNOSIS — O24119 Pre-existing diabetes mellitus, type 2, in pregnancy, unspecified trimester: Secondary | ICD-10-CM

## 2018-10-31 DIAGNOSIS — Z23 Encounter for immunization: Secondary | ICD-10-CM | POA: Diagnosis not present

## 2018-10-31 DIAGNOSIS — O24113 Pre-existing diabetes mellitus, type 2, in pregnancy, third trimester: Secondary | ICD-10-CM

## 2018-10-31 DIAGNOSIS — O10913 Unspecified pre-existing hypertension complicating pregnancy, third trimester: Secondary | ICD-10-CM

## 2018-10-31 DIAGNOSIS — O0991 Supervision of high risk pregnancy, unspecified, first trimester: Secondary | ICD-10-CM

## 2018-10-31 DIAGNOSIS — O0993 Supervision of high risk pregnancy, unspecified, third trimester: Secondary | ICD-10-CM

## 2018-10-31 DIAGNOSIS — O10919 Unspecified pre-existing hypertension complicating pregnancy, unspecified trimester: Secondary | ICD-10-CM | POA: Diagnosis not present

## 2018-10-31 DIAGNOSIS — E1165 Type 2 diabetes mellitus with hyperglycemia: Secondary | ICD-10-CM

## 2018-10-31 DIAGNOSIS — O24111 Pre-existing diabetes mellitus, type 2, in pregnancy, first trimester: Secondary | ICD-10-CM

## 2018-10-31 MED ORDER — INSULIN GLARGINE 100 UNIT/ML SOLOSTAR PEN: 50.0000 [IU] | PEN_INJECTOR | Freq: Two times a day (BID) | SUBCUTANEOUS | 11 refills | Status: DC

## 2018-10-31 MED ORDER — LABETALOL HCL 300 MG PO TABS: 300.0000 mg | ORAL_TABLET | Freq: Two times a day (BID) | ORAL | 3 refills | Status: DC

## 2018-10-31 MED ORDER — TETANUS-DIPHTH-ACELL PERTUSSIS 5-2.5-18.5 LF-MCG/0.5 IM SUSP: 0.5000 mL | Freq: Once | INTRAMUSCULAR | Status: DC

## 2018-10-31 MED ORDER — METFORMIN HCL 1000 MG PO TABS: 500.0000 mg | ORAL_TABLET | Freq: Three times a day (TID) | ORAL | 3 refills | Status: DC

## 2018-10-31 NOTE — Progress Notes (Signed)
Subjective:  [name] is a 33 y.o. G1P0 at [redacted]w[redacted]d being seen today for ongoing prenatal care.  She is currently monitored for the following issues for this high-risk pregnancy and has BMI 33.0-33.9,adult; Chronic hypertension affecting pregnancy; Type 2 diabetes mellitus during pregnancy; Supervision of high risk pregnancy, antepartum, first trimester; Proteinuria affecting pregnancy; and Pap smear abnormality of cervix/human papillomavirus (HPV) positive on their problem list.  GDM: Patient taking lantus, humalog, metformin.  Reports no hypoglycemic episodes.  Tolerating medication well. Didn't bring log, but reports CBGs as: Fasting: 120-130 2hr PP: 150s  Patient reports backache.  Contractions: Not present. Vag. Bleeding: None.  Movement: Present. Denies leaking of fluid.   The following portions of the patient's history were reviewed and updated as appropriate: allergies, current medications, past family history, past medical history, past social history, past surgical history and problem list. Problem list updated.  Objective:   Vitals:   10/31/18 1402  BP: (!) 149/93  Pulse: (!) 128  Weight: 234 lb (106.1 kg)    Fetal Status:     Movement: Present     General:  Alert, oriented and cooperative. Patient is in no acute distress.  Skin: Skin is warm and dry. No rash noted.   Cardiovascular: Normal heart rate noted  Respiratory: Normal respiratory effort, no problems with respiration noted  Abdomen: Soft, gravid, appropriate for gestational age. Pain/Pressure: Present     Pelvic: Vag. Bleeding: None     Cervical exam deferred        Extremities: Normal range of motion.  Edema: None  Mental Status: Normal mood and affect. Normal behavior. Normal judgment and thought content.   Urinalysis:      Assessment and Plan:  Pregnancy: G1P0 at [redacted]w[redacted]d  1. Supervision of high risk pregnancy, antepartum, first trimester FHT normal - CBC - HIV antibody (with reflex) - RPR  2. Chronic  hypertension affecting pregnancy Start labetalol 300mg  BID - CBC - HIV antibody (with reflex) - RPR  3. Uncontrolled type 2 diabetes mellitus with hyperglycemia (HCC) Increase lantus to 60/50 units Try metformin 500mg  TID see if tolerates w/o side effects [place] in 2 weeks.  4. Type 2 diabetes mellitus during pregnancy, antepartum  - CBC - HIV antibody (with reflex) - RPR  5. Pregnancy with type 2 diabetes mellitus in first trimester - metFORMIN (GLUCOPHAGE) 1000 MG tablet; Take 0.5 tablets (500 mg total) by mouth 3 (three) times daily with meals.  Dispense: 180 tablet; Refill: 3  Preterm labor symptoms and general obstetric precautions including but not limited to vaginal bleeding, contractions, leaking of fluid and fetal movement were reviewed in detail with the patient. Please refer to After Visit Summary for other counseling recommendations.  No follow-ups on file.   Levie Heritage, DO

## 2018-11-01 LAB — RPR: RPR Ser Ql: NONREACTIVE

## 2018-11-01 LAB — CBC
Hematocrit: 34.6 % (ref 34.0–46.6)
Hemoglobin: 11.8 g/dL (ref 11.1–15.9)
MCH: 29.7 pg (ref 26.6–33.0)
MCHC: 34.1 g/dL (ref 31.5–35.7)
MCV: 87 fL (ref 79–97)
PLATELETS: 430 10*3/uL (ref 150–450)
RBC: 3.97 x10E6/uL (ref 3.77–5.28)
RDW: 13.6 % (ref 11.7–15.4)
WBC: 10 10*3/uL (ref 3.4–10.8)

## 2018-11-01 LAB — HIV ANTIBODY (ROUTINE TESTING W REFLEX): HIV Screen 4th Generation wRfx: NONREACTIVE

## 2018-11-07 ENCOUNTER — Encounter: Payer: BLUE CROSS/BLUE SHIELD | Admitting: Family Medicine

## 2018-11-14 ENCOUNTER — Ambulatory Visit (INDEPENDENT_AMBULATORY_CARE_PROVIDER_SITE_OTHER): Payer: BLUE CROSS/BLUE SHIELD | Admitting: Family Medicine

## 2018-11-14 VITALS — BP 113/77 | HR 115 | Wt 236.0 lb

## 2018-11-14 DIAGNOSIS — O10919 Unspecified pre-existing hypertension complicating pregnancy, unspecified trimester: Secondary | ICD-10-CM

## 2018-11-14 DIAGNOSIS — O0990 Supervision of high risk pregnancy, unspecified, unspecified trimester: Secondary | ICD-10-CM

## 2018-11-14 DIAGNOSIS — O0993 Supervision of high risk pregnancy, unspecified, third trimester: Secondary | ICD-10-CM

## 2018-11-14 DIAGNOSIS — O24113 Pre-existing diabetes mellitus, type 2, in pregnancy, third trimester: Secondary | ICD-10-CM

## 2018-11-14 DIAGNOSIS — O10913 Unspecified pre-existing hypertension complicating pregnancy, third trimester: Secondary | ICD-10-CM

## 2018-11-14 DIAGNOSIS — O24119 Pre-existing diabetes mellitus, type 2, in pregnancy, unspecified trimester: Secondary | ICD-10-CM

## 2018-11-14 LAB — POCT URINALYSIS DIPSTICK OB
Glucose, UA: NEGATIVE
LEUKOCYTES UA: NEGATIVE
Nitrite, UA: NEGATIVE
Spec Grav, UA: 1.015 (ref 1.010–1.025)
pH, UA: 6.5 (ref 5.0–8.0)

## 2018-11-14 MED ORDER — ONDANSETRON 4 MG PO TBDP: 4.0000 mg | ORAL_TABLET | Freq: Four times a day (QID) | ORAL | 2 refills | Status: DC | PRN

## 2018-11-14 NOTE — Progress Notes (Signed)
Subjective:  [name] is a 33 y.o. G1P0 at [redacted]w[redacted]d being seen today for ongoing prenatal care.  She is currently monitored for the following issues for this high-risk pregnancy and has BMI 33.0-33.9,adult; Chronic hypertension affecting pregnancy; Type 2 diabetes mellitus during pregnancy; Supervision of high risk pregnancy, antepartum, first trimester; Proteinuria affecting pregnancy; and Pap smear abnormality of cervix/human papillomavirus (HPV) positive on their problem list.  GDM: Patient taking metformin, lantus, lispro.  Reports no hypoglycemic episodes.  Tolerating medication well Fasting: 90s 2hr PP: 115-120  Patient reports nausea, vomiting, diarrhea x 3 days. improving some, but still having nausea..  Contractions: Regular. Vag. Bleeding: None.  Movement: Present. Denies leaking of fluid.   The following portions of the patient's history were reviewed and updated as appropriate: allergies, current medications, past family history, past medical history, past social history, past surgical history and problem list. Problem list updated.  Objective:   Vitals:   11/14/18 1414  BP: 113/77  Pulse: (!) 115  Weight: 236 lb (107 kg)    Fetal Status: Fetal Heart Rate (bpm): 140   Movement: Present     General:  Alert, oriented and cooperative. Patient is in no acute distress.  Skin: Skin is warm and dry. No rash noted.   Cardiovascular: Normal heart rate noted  Respiratory: Normal respiratory effort, no problems with respiration noted  Abdomen: Soft, gravid, appropriate for gestational age. Pain/Pressure: Absent     Pelvic: Vag. Bleeding: None     Cervical exam deferred        Extremities: Normal range of motion.  Edema: Trace  Mental Status: Normal mood and affect. Normal behavior. Normal judgment and thought content.   Urinalysis:      Assessment and Plan:  Pregnancy: G1P0 at [redacted]w[redacted]d  1. Supervision of high risk pregnancy, antepartum FHT and FH normal - POC Urinalysis  Dipstick OB  2. Chronic hypertension affecting pregnancy controlled  3. Type 2 diabetes mellitus during pregnancy, antepartum Controlled. Continue medications [place] tomorrow for growth  4. Vomiting Gastroenteritis - symptomatic relief. Will give zofran.  Preterm labor symptoms and general obstetric precautions including but not limited to vaginal bleeding, contractions, leaking of fluid and fetal movement were reviewed in detail with the patient. Please refer to After Visit Summary for other counseling recommendations.  Return in about 2 weeks (around 11/28/2018) for OB f/u.   Levie Heritage, DO

## 2018-11-15 ENCOUNTER — Encounter (HOSPITAL_COMMUNITY): Payer: Self-pay

## 2018-11-15 ENCOUNTER — Ambulatory Visit (HOSPITAL_COMMUNITY)
Admission: RE | Admit: 2018-11-15 | Discharge: 2018-11-15 | Disposition: A | Discharge status: Home / Self Care | Payer: BLUE CROSS/BLUE SHIELD | Source: Ambulatory Visit | Attending: Family Medicine | Admitting: Family Medicine

## 2018-11-15 ENCOUNTER — Other Ambulatory Visit (HOSPITAL_COMMUNITY): Payer: Self-pay | Admitting: *Deleted

## 2018-11-15 DIAGNOSIS — Z3A30 30 weeks gestation of pregnancy: Secondary | ICD-10-CM

## 2018-11-15 DIAGNOSIS — O99213 Obesity complicating pregnancy, third trimester: Secondary | ICD-10-CM

## 2018-11-15 DIAGNOSIS — Z362 Encounter for other antenatal screening follow-up: Secondary | ICD-10-CM | POA: Diagnosis not present

## 2018-11-15 DIAGNOSIS — O10013 Pre-existing essential hypertension complicating pregnancy, third trimester: Secondary | ICD-10-CM

## 2018-11-15 DIAGNOSIS — O24113 Pre-existing diabetes mellitus, type 2, in pregnancy, third trimester: Secondary | ICD-10-CM | POA: Diagnosis not present

## 2018-11-15 DIAGNOSIS — O10919 Unspecified pre-existing hypertension complicating pregnancy, unspecified trimester: Secondary | ICD-10-CM | POA: Insufficient documentation

## 2018-11-15 DIAGNOSIS — E119 Type 2 diabetes mellitus without complications: Secondary | ICD-10-CM

## 2018-11-29 ENCOUNTER — Ambulatory Visit (HOSPITAL_COMMUNITY): Payer: BLUE CROSS/BLUE SHIELD | Admitting: *Deleted

## 2018-11-29 ENCOUNTER — Encounter (HOSPITAL_COMMUNITY): Payer: Self-pay

## 2018-11-29 ENCOUNTER — Ambulatory Visit (INDEPENDENT_AMBULATORY_CARE_PROVIDER_SITE_OTHER): Payer: BLUE CROSS/BLUE SHIELD | Admitting: Family Medicine

## 2018-11-29 ENCOUNTER — Ambulatory Visit (HOSPITAL_COMMUNITY)
Admission: RE | Admit: 2018-11-29 | Discharge: 2018-11-29 | Disposition: A | Discharge status: Home / Self Care | Payer: BLUE CROSS/BLUE SHIELD | Source: Ambulatory Visit | Attending: Family Medicine | Admitting: Family Medicine

## 2018-11-29 VITALS — BP 132/83 | HR 110 | Wt 239.0 lb

## 2018-11-29 VITALS — Wt 239.6 lb

## 2018-11-29 DIAGNOSIS — O0993 Supervision of high risk pregnancy, unspecified, third trimester: Secondary | ICD-10-CM

## 2018-11-29 DIAGNOSIS — Z3A32 32 weeks gestation of pregnancy: Secondary | ICD-10-CM

## 2018-11-29 DIAGNOSIS — O24113 Pre-existing diabetes mellitus, type 2, in pregnancy, third trimester: Secondary | ICD-10-CM | POA: Diagnosis not present

## 2018-11-29 DIAGNOSIS — Z794 Long term (current) use of insulin: Secondary | ICD-10-CM | POA: Insufficient documentation

## 2018-11-29 DIAGNOSIS — O24313 Unspecified pre-existing diabetes mellitus in pregnancy, third trimester: Principal | ICD-10-CM

## 2018-11-29 DIAGNOSIS — O99213 Obesity complicating pregnancy, third trimester: Secondary | ICD-10-CM | POA: Diagnosis not present

## 2018-11-29 DIAGNOSIS — E119 Type 2 diabetes mellitus without complications: Secondary | ICD-10-CM | POA: Diagnosis not present

## 2018-11-29 DIAGNOSIS — O0990 Supervision of high risk pregnancy, unspecified, unspecified trimester: Secondary | ICD-10-CM

## 2018-11-29 DIAGNOSIS — E1165 Type 2 diabetes mellitus with hyperglycemia: Secondary | ICD-10-CM

## 2018-11-29 DIAGNOSIS — O10013 Pre-existing essential hypertension complicating pregnancy, third trimester: Secondary | ICD-10-CM | POA: Diagnosis not present

## 2018-11-29 DIAGNOSIS — O24119 Pre-existing diabetes mellitus, type 2, in pregnancy, unspecified trimester: Secondary | ICD-10-CM

## 2018-11-29 DIAGNOSIS — O10919 Unspecified pre-existing hypertension complicating pregnancy, unspecified trimester: Secondary | ICD-10-CM

## 2018-11-29 DIAGNOSIS — O10913 Unspecified pre-existing hypertension complicating pregnancy, third trimester: Secondary | ICD-10-CM

## 2018-11-29 DIAGNOSIS — IMO0001 Reserved for inherently not codable concepts without codable children: Secondary | ICD-10-CM

## 2018-11-29 LAB — POCT URINALYSIS DIPSTICK OB
Glucose, UA: NEGATIVE
Ketones, UA: NEGATIVE
Leukocytes, UA: NEGATIVE
Nitrite, UA: NEGATIVE
Spec Grav, UA: 1.015 (ref 1.010–1.025)
pH, UA: 6 (ref 5.0–8.0)

## 2018-11-29 MED ORDER — INSULIN LISPRO 200 UNIT/ML ~~LOC~~ SOPN: 10.0000 [IU] | PEN_INJECTOR | Freq: Three times a day (TID) | SUBCUTANEOUS | 3 refills | Status: DC

## 2018-11-29 NOTE — Progress Notes (Signed)
Subjective:  [name] is a 33 y.o. G1P0 at [redacted]w[redacted]d being seen today for ongoing prenatal care.  She is currently monitored for the following issues for this high-risk pregnancy and has BMI 33.0-33.9,adult; Chronic hypertension affecting pregnancy; Type 2 diabetes mellitus during pregnancy; Supervision of high risk pregnancy, antepartum, first trimester; Proteinuria affecting pregnancy; and Pap smear abnormality of cervix/human papillomavirus (HPV) positive on their problem list.  GDM: Patient taking metformin and insulin.  Reports no hypoglycemic episodes.  Tolerating medication well. Reports CBGs as:  Fasting: <95 2hr PP: after breakfast and lunch controlled, after dinner 130s  Patient reports no complaints.  Contractions: Not present. Vag. Bleeding: None.  Movement: Present. Denies leaking of fluid.   The following portions of the patient's history were reviewed and updated as appropriate: allergies, current medications, past family history, past medical history, past social history, past surgical history and problem list. Problem list updated.  Objective:   Vitals:   11/29/18 0859  BP: 132/83  Pulse: (!) 110  Weight: 239 lb (108.4 kg)    Fetal Status: Fetal Heart Rate (bpm): 132   Movement: Present     General:  Alert, oriented and cooperative. Patient is in no acute distress.  Skin: Skin is warm and dry. No rash noted.   Cardiovascular: Normal heart rate noted  Respiratory: Normal respiratory effort, no problems with respiration noted  Abdomen: Soft, gravid, appropriate for gestational age. Pain/Pressure: Absent     Pelvic: Vag. Bleeding: None     Cervical exam deferred        Extremities: Normal range of motion.  Edema: Trace  Mental Status: Normal mood and affect. Normal behavior. Normal judgment and thought content.   Urinalysis:      Assessment and Plan:  Pregnancy: G1P0 at [redacted]w[redacted]d  1. Supervision of high risk pregnancy, antepartum FHT and FH normal - POC Urinalysis  Dipstick OB  2. Chronic hypertension affecting pregnancy BP controlled Continue Labetalol 300mg  BID Continue ASA 81mg   3. Type 2 diabetes mellitus during pregnancy, antepartum Last growth [place] 2/14 - EFW 4#15oz (90% with AC > 97%) BPP today.  Increase insuling predinner to 15.  Preterm labor symptoms and general obstetric precautions including but not limited to vaginal bleeding, contractions, leaking of fluid and fetal movement were reviewed in detail with the patient. Please refer to After Visit Summary for other counseling recommendations.  No follow-ups on file.   Levie Heritage, DO

## 2018-12-06 ENCOUNTER — Encounter (HOSPITAL_COMMUNITY): Payer: Self-pay

## 2018-12-06 ENCOUNTER — Ambulatory Visit (HOSPITAL_BASED_OUTPATIENT_CLINIC_OR_DEPARTMENT_OTHER)
Admission: RE | Admit: 2018-12-06 | Discharge: 2018-12-06 | Disposition: A | Discharge status: Home / Self Care | Payer: BLUE CROSS/BLUE SHIELD | Source: Ambulatory Visit | Attending: Family Medicine | Admitting: Family Medicine

## 2018-12-06 ENCOUNTER — Ambulatory Visit (HOSPITAL_COMMUNITY): Payer: BLUE CROSS/BLUE SHIELD | Admitting: *Deleted

## 2018-12-06 ENCOUNTER — Other Ambulatory Visit: Payer: Self-pay

## 2018-12-06 ENCOUNTER — Other Ambulatory Visit (HOSPITAL_COMMUNITY): Payer: Self-pay | Admitting: Obstetrics and Gynecology

## 2018-12-06 ENCOUNTER — Inpatient Hospital Stay (HOSPITAL_COMMUNITY)
Admission: AD | Admit: 2018-12-06 | Discharge: 2018-12-06 | Disposition: A | Discharge status: Home / Self Care | Payer: BLUE CROSS/BLUE SHIELD | Attending: Obstetrics and Gynecology | Admitting: Obstetrics and Gynecology

## 2018-12-06 ENCOUNTER — Encounter (HOSPITAL_COMMUNITY): Payer: Self-pay | Admitting: *Deleted

## 2018-12-06 VITALS — BP 151/103 | HR 103

## 2018-12-06 VITALS — BP 154/106 | HR 107 | Wt 243.6 lb

## 2018-12-06 DIAGNOSIS — Z3A33 33 weeks gestation of pregnancy: Secondary | ICD-10-CM | POA: Diagnosis not present

## 2018-12-06 DIAGNOSIS — O24913 Unspecified diabetes mellitus in pregnancy, third trimester: Secondary | ICD-10-CM

## 2018-12-06 DIAGNOSIS — O99213 Obesity complicating pregnancy, third trimester: Secondary | ICD-10-CM

## 2018-12-06 DIAGNOSIS — O10919 Unspecified pre-existing hypertension complicating pregnancy, unspecified trimester: Secondary | ICD-10-CM

## 2018-12-06 DIAGNOSIS — E119 Type 2 diabetes mellitus without complications: Secondary | ICD-10-CM | POA: Insufficient documentation

## 2018-12-06 DIAGNOSIS — O0993 Supervision of high risk pregnancy, unspecified, third trimester: Secondary | ICD-10-CM

## 2018-12-06 DIAGNOSIS — O10913 Unspecified pre-existing hypertension complicating pregnancy, third trimester: Secondary | ICD-10-CM | POA: Diagnosis not present

## 2018-12-06 DIAGNOSIS — Z91030 Bee allergy status: Secondary | ICD-10-CM | POA: Diagnosis not present

## 2018-12-06 DIAGNOSIS — Z79899 Other long term (current) drug therapy: Secondary | ICD-10-CM | POA: Diagnosis not present

## 2018-12-06 DIAGNOSIS — O163 Unspecified maternal hypertension, third trimester: Secondary | ICD-10-CM | POA: Diagnosis not present

## 2018-12-06 DIAGNOSIS — O24113 Pre-existing diabetes mellitus, type 2, in pregnancy, third trimester: Secondary | ICD-10-CM

## 2018-12-06 DIAGNOSIS — O368130 Decreased fetal movements, third trimester, not applicable or unspecified: Secondary | ICD-10-CM | POA: Diagnosis not present

## 2018-12-06 DIAGNOSIS — Z833 Family history of diabetes mellitus: Secondary | ICD-10-CM | POA: Insufficient documentation

## 2018-12-06 DIAGNOSIS — O0991 Supervision of high risk pregnancy, unspecified, first trimester: Secondary | ICD-10-CM

## 2018-12-06 DIAGNOSIS — IMO0001 Reserved for inherently not codable concepts without codable children: Secondary | ICD-10-CM

## 2018-12-06 DIAGNOSIS — Z7982 Long term (current) use of aspirin: Secondary | ICD-10-CM | POA: Diagnosis not present

## 2018-12-06 DIAGNOSIS — Z794 Long term (current) use of insulin: Secondary | ICD-10-CM | POA: Insufficient documentation

## 2018-12-06 DIAGNOSIS — O10013 Pre-existing essential hypertension complicating pregnancy, third trimester: Secondary | ICD-10-CM | POA: Diagnosis not present

## 2018-12-06 DIAGNOSIS — O289 Unspecified abnormal findings on antenatal screening of mother: Secondary | ICD-10-CM | POA: Diagnosis not present

## 2018-12-06 DIAGNOSIS — Z91018 Allergy to other foods: Secondary | ICD-10-CM | POA: Diagnosis not present

## 2018-12-06 DIAGNOSIS — Z8249 Family history of ischemic heart disease and other diseases of the circulatory system: Secondary | ICD-10-CM | POA: Diagnosis not present

## 2018-12-06 DIAGNOSIS — Z87891 Personal history of nicotine dependence: Secondary | ICD-10-CM | POA: Diagnosis not present

## 2018-12-06 DIAGNOSIS — I10 Essential (primary) hypertension: Secondary | ICD-10-CM | POA: Diagnosis not present

## 2018-12-06 LAB — PROTEIN / CREATININE RATIO, URINE
Creatinine, Urine: 32.12 mg/dL
Protein Creatinine Ratio: 4.48 mg/mg{Cre} — ABNORMAL HIGH (ref 0.00–0.15)
Total Protein, Urine: 144 mg/dL

## 2018-12-06 LAB — COMPREHENSIVE METABOLIC PANEL
ALT: 15 U/L (ref 0–44)
AST: 29 U/L (ref 15–41)
Albumin: 2.1 g/dL — ABNORMAL LOW (ref 3.5–5.0)
Alkaline Phosphatase: 113 U/L (ref 38–126)
Anion gap: 9 (ref 5–15)
BUN: 5 mg/dL — ABNORMAL LOW (ref 6–20)
CO2: 17 mmol/L — ABNORMAL LOW (ref 22–32)
Calcium: 8.6 mg/dL — ABNORMAL LOW (ref 8.9–10.3)
Chloride: 107 mmol/L (ref 98–111)
Creatinine, Ser: 0.54 mg/dL (ref 0.44–1.00)
GFR calc Af Amer: >60 mL/min (ref >60)
Glucose, Bld: 126 mg/dL — ABNORMAL HIGH (ref 70–99)
Potassium: 4.8 mmol/L (ref 3.5–5.1)
Sodium: 133 mmol/L — ABNORMAL LOW (ref 135–145)
Total Bilirubin: 1.3 mg/dL — ABNORMAL HIGH (ref 0.3–1.2)
Total Protein: 6 g/dL — ABNORMAL LOW (ref 6.5–8.1)

## 2018-12-06 LAB — URINALYSIS, ROUTINE W REFLEX MICROSCOPIC
Bilirubin Urine: NEGATIVE
Glucose, UA: NEGATIVE mg/dL
Hgb urine dipstick: NEGATIVE
Ketones, ur: 5 mg/dL — AB
Leukocytes,Ua: NEGATIVE
Nitrite: NEGATIVE
Protein, ur: 100 mg/dL — AB
SPECIFIC GRAVITY, URINE: 1.008 (ref 1.005–1.030)
pH: 7 (ref 5.0–8.0)

## 2018-12-06 LAB — CBC
HCT: 36.4 % (ref 36.0–46.0)
Hemoglobin: 12.3 g/dL (ref 12.0–15.0)
MCH: 29.6 pg (ref 26.0–34.0)
MCHC: 33.8 g/dL (ref 30.0–36.0)
MCV: 87.7 fL (ref 80.0–100.0)
PLATELETS: 422 10*3/uL — AB (ref 150–400)
RBC: 4.15 MIL/uL (ref 3.87–5.11)
RDW: 13.2 % (ref 11.5–15.5)
WBC: 9.9 10*3/uL (ref 4.0–10.5)
nRBC: 0 % (ref 0.0–0.2)

## 2018-12-06 MED ORDER — LABETALOL HCL 5 MG/ML IV SOLN
40.0000 mg | INTRAVENOUS | Status: DC | PRN
  Administered 2018-12-06: 40 mg via INTRAVENOUS
  Filled 2018-12-06: qty 8

## 2018-12-06 MED ORDER — HYDRALAZINE HCL 20 MG/ML IJ SOLN: 10.0000 mg | INTRAMUSCULAR | Status: DC | PRN

## 2018-12-06 MED ORDER — LABETALOL HCL 5 MG/ML IV SOLN: 80.0000 mg | INTRAVENOUS | Status: DC | PRN

## 2018-12-06 MED ORDER — LABETALOL HCL 100 MG PO TABS
300.0000 mg | ORAL_TABLET | Freq: Once | ORAL | Status: AC
  Administered 2018-12-06: 300 mg via ORAL
  Filled 2018-12-06: qty 3

## 2018-12-06 MED ORDER — LABETALOL HCL 5 MG/ML IV SOLN
20.0000 mg | INTRAVENOUS | Status: DC | PRN
  Administered 2018-12-06: 20 mg via INTRAVENOUS
  Filled 2018-12-06: qty 4

## 2018-12-06 MED ORDER — LABETALOL HCL 300 MG PO TABS: 300.0000 mg | ORAL_TABLET | Freq: Three times a day (TID) | ORAL | 3 refills | Status: DC

## 2018-12-06 NOTE — MAU Provider Note (Addendum)
History     CSN: [id number]  Arrival date and time: 12/06/18 1123   First Provider Initiated Contact with Patient 12/06/18 1226      Chief Complaint  Patient presents with  . Hypertension   [name] is a 33 y.o. G1P0 at 6133w5d who presents for Hypertension.  She was being evaluated in MFM for a BPP.  She states she was told that her BP was elevated, despite her taking her Labetalol 300mg  today.  She reports she was experiencing some anxiety from decreased fetal movement and contributes her elevation to this.  Patient denies HA, visual disturbances, epigastric pain, and numbness/tingling.  She does endorse swelling of her feet and legs, but feels it is good today since she has not gone to work.  Patient endorses good fetal movement since arrival and denies contractions, LOF, and vaginal concerns including bleeding and discharge.      OB History    Gravida  1   Para  0   Term  0   Preterm  0   AB  0   Living  0     SAB  0   TAB  0   Ectopic  0   Multiple  0   Live Births  0           Past Medical History:  Diagnosis Date  . Diabetes mellitus without complication (HCC)   . Hypertension     Past Surgical History:  Procedure Laterality Date  . addnoids    . TONSILLECTOMY AND ADENOIDECTOMY      Family History  Problem Relation Age of Onset  . Diabetes Mother   . Hypertension Mother   . Hypertension Maternal Grandmother   . Diabetes Maternal Aunt   . Diabetes Maternal Uncle     Social History   Tobacco Use  . Smoking status: Former Smoker    Years: 0.00    Last attempt to quit: 03/30/2006    Years since quitting: 12.6  . Smokeless tobacco: Never Used  Substance Use Topics  . Alcohol use: No  . Drug use: No    Allergies:  Allergies  Allergen Reactions  . Almond (Diagnostic) Hives  . Bee Venom Hives    Medications Prior to Admission  Medication Sig Dispense Refill Last Dose  . aspirin EC 81 MG tablet Take 1 tablet (81 mg total) by  mouth daily. Take after 12 weeks for prevention of preeclampsia later in pregnancy 300 tablet 2 Taking  . cyclobenzaprine (FLEXERIL) 10 MG tablet Take 1 tablet (10 mg total) by mouth 3 (three) times daily as needed for muscle spasms. 30 tablet 2 Taking  . Insulin Glargine (LANTUS SOLOSTAR) 100 UNIT/ML Solostar Pen Inject 50-60 Units into the skin 2 (two) times daily. 60 units in AM and 50 units at bedtime 15 mL 11 Taking  . Insulin Lispro (HUMALOG KWIKPEN) 200 UNIT/ML SOPN Inject 10-15 Units into the skin 3 (three) times daily before meals. 07/11/14 uints 3 mL 3 Taking  . Insulin Pen Needle (PEN NEEDLES) 31G X 5 MM MISC 1 each by Does not apply route 5 (five) times daily as needed. 150 each 2 Taking  . labetalol (NORMODYNE) 300 MG tablet Take 1 tablet (300 mg total) by mouth 2 (two) times daily. 60 tablet 3 Taking  . metFORMIN (GLUCOPHAGE) 1000 MG tablet Take 0.5 tablets (500 mg total) by mouth 3 (three) times daily with meals. 180 tablet 3 Taking  . metoCLOPramide (REGLAN) 10 MG tablet Take 10  mg by mouth 4 (four) times daily.   Taking  . omeprazole (PRILOSEC) 10 MG capsule Take 10 mg by mouth daily.   Taking  . ondansetron (ZOFRAN ODT) 4 MG disintegrating tablet Take 1 tablet (4 mg total) by mouth every 6 (six) hours as needed for nausea. (Patient not taking: Reported on 11/29/2018) 20 tablet 2 Not Taking  . Prenatal Multivit-Min-Fe-FA (PRENATAL VITAMINS PO) Take by mouth.   Taking    Review of Systems  Eyes: Negative for photophobia and visual disturbance.  Respiratory: Negative for chest tightness and shortness of breath.   Gastrointestinal: Negative for nausea and vomiting.  Neurological: Negative for dizziness, light-headedness and headaches.   Physical Exam   Blood pressure (!) 163/105, pulse (!) 103, temperature 98.5 F (36.9 C), temperature source Oral, resp. rate 18, height 5\' 7"  (1.702 m), weight 109.6 kg, last menstrual period 04/05/2018, SpO2 100 %.  Physical Exam   Constitutional: She is oriented to person, place, and time. She appears well-developed and well-nourished.  HENT:  Head: Normocephalic and atraumatic.  Eyes: Conjunctivae are normal.  Neck: Normal range of motion.  Cardiovascular: Normal rate, regular rhythm and normal heart sounds.  Respiratory: Effort normal and breath sounds normal.  GI: Soft. Bowel sounds are normal.  Musculoskeletal: Normal range of motion.        General: Edema (+3 BLE) present.  Neurological: She is alert and oriented to person, place, and time.  Skin: Skin is warm and dry.  Psychiatric: She has a normal mood and affect. Her behavior is normal.    Fetal Assessment 135 bpm, Mod Var, -Decels, +Accels Toco: None graphed  MAU Course   Results for orders placed or performed during the hospital encounter of 12/06/18 (from the past 24 hour(s))  Urinalysis, Routine w reflex microscopic     Status: Abnormal   Collection Time: 12/06/18 12:04 PM  Result Value Ref Range   Color, Urine STRAW (A) YELLOW   APPearance CLEAR CLEAR   Specific Gravity, Urine 1.008 1.005 - 1.030   pH 7.0 5.0 - 8.0   Glucose, UA NEGATIVE NEGATIVE mg/dL   Hgb urine dipstick NEGATIVE NEGATIVE   Bilirubin Urine NEGATIVE NEGATIVE   Ketones, ur 5 (A) NEGATIVE mg/dL   Protein, ur 597 (A) NEGATIVE mg/dL   Nitrite NEGATIVE NEGATIVE   Leukocytes,Ua NEGATIVE NEGATIVE   RBC / HPF 0-5 0 - 5 RBC/hpf   WBC, UA 0-5 0 - 5 WBC/hpf   Bacteria, UA RARE (A) NONE SEEN   Squamous Epithelial / LPF 0-5 0 - 5  Protein / creatinine ratio, urine     Status: Abnormal   Collection Time: 12/06/18 12:04 PM  Result Value Ref Range   Creatinine, Urine 32.12 mg/dL   Total Protein, Urine 144 mg/dL   Protein Creatinine Ratio 4.48 (H) 0.00 - 0.15 mg/mg[Cre]  CBC     Status: Abnormal   Collection Time: 12/06/18 12:31 PM  Result Value Ref Range   WBC 9.9 4.0 - 10.5 K/uL   RBC 4.15 3.87 - 5.11 MIL/uL   Hemoglobin 12.3 12.0 - 15.0 g/dL   HCT 47.1 85.5 - 01.5 %   MCV  87.7 80.0 - 100.0 fL   MCH 29.6 26.0 - 34.0 pg   MCHC 33.8 30.0 - 36.0 g/dL   RDW 86.8 25.7 - 49.3 %   Platelets 422 (H) 150 - 400 K/uL   nRBC 0.0 0.0 - 0.2 %   No results found.  MDM Physical Exam Labs: CBC, CMP, PC  Ratio Measure BPQ15 min EFM  Assessment and Plan  33 year old Cat I FT G1P0 at 33.5 weeks CHTN  -Exam findings discussed. -Labetalol ordered per [facility] protocol. -Discussed evaluation of labs and possible increase in oral medications. -Okay for ice chips. -Will monitor and reassess once labs return.  Follow Up (1:34 PM)  -Results reviewed with Dr. Vergie Living who advises: *Give Labetalol 300mg  now and monitor bp *Have patient return to office on Monday or Tuesday for BP check -Patient updated on results and POC -Informed that okay to return to work without restrictions, but patient encouraged to verbalize any change in status including unrelieved HAs, epigastric pain, DFM, and visual disturbances. -Will give Labetalol 300mg  now -Will monitor BP and schedule follow up via Epic -Reactive NST; Can remove from EFM  Follow Up (3:21 PM) Vitals:   12/06/18 1430 12/06/18 1445 12/06/18 1500 12/06/18 1515  BP: (!) 139/100 (!) 141/99 139/89 (!) 146/103  Pulse: (!) 102 (!) 101 99 (!) 101  Resp:      Temp:      TempSrc:      SpO2:      Weight:      Height:       -BP improved since Labetalol dosing. -Encouraged to call or return to MAU if symptoms worsen or with the onset of new symptoms. -Discharged to home in improved condition   Cherre Robins MSN, CNM 12/06/2018, 12:27 PM

## 2018-12-06 NOTE — MAU Note (Signed)
Was over at the [place] [facility] (MFM), they sent her over here because of her BP. Is on BP meds. Denies HA, visual changes, epigastric pain or increase in swelling.  Had reported decreased movement to them, - baby was fine and active on monitor.

## 2018-12-06 NOTE — Discharge Instructions (Signed)
.  Preeclampsia and Eclampsia    Preeclampsia is a serious condition that may develop during pregnancy. It is also called toxemia of pregnancy. This condition causes high blood pressure along with other symptoms, such as swelling and headaches. These symptoms may develop as the condition gets worse. Preeclampsia may occur at 20 weeks of pregnancy or later.  Diagnosing and treating preeclampsia early is very important. If not treated early, it can cause serious problems for you and your baby. One problem it can lead to is eclampsia. Eclampsia is a condition that causes muscle jerking or shaking (convulsions or seizures) and other serious problems for the mother. During pregnancy, delivering your baby may be the best treatment for preeclampsia or eclampsia. For most women, preeclampsia and eclampsia symptoms go away after giving birth.  In rare cases, a woman may develop preeclampsia after giving birth (postpartum preeclampsia). This usually occurs within 48 hours after childbirth but may occur up to 6 weeks after giving birth.  What are the causes?  The cause of preeclampsia is not known.  What increases the risk?  The following risk factors make you more likely to develop preeclampsia:   Being pregnant for the first time.   Having had preeclampsia during a past pregnancy.   Having a family history of preeclampsia.   Having high blood pressure.   Being pregnant with more than one baby.   Being 35 or older.   Being African-American.   Having kidney disease or diabetes.   Having medical conditions such as lupus or blood diseases.   Being very overweight (obese).  What are the signs or symptoms?  The earliest signs of preeclampsia are:   High blood pressure.   Increased protein in your urine. Your health care provider will check for this at every visit before you give birth (prenatal visit).  Other symptoms that may develop as the condition gets worse include:   Severe headaches.   Sudden weight  gain.   Swelling of the hands, face, legs, and feet.   Nausea and vomiting.   Vision problems, such as blurred or double vision.   Numbness in the face, arms, legs, and feet.   Urinating less than usual.   Dizziness.   Slurred speech.   Abdominal pain, especially upper abdominal pain.   Convulsions or seizures.  How is this diagnosed?  There are no screening tests for preeclampsia. Your health care provider will ask you about symptoms and check for signs of preeclampsia during your prenatal visits. You may also have tests that include:   Urine tests.   Blood tests.   Checking your blood pressure.   Monitoring your baby's heart rate.   Ultrasound.  How is this treated?  You and your health care provider will determine the treatment approach that is best for you. Treatment may include:   Having more frequent prenatal exams to check for signs of preeclampsia, if you have an increased risk for preeclampsia.   Medicine to lower your blood pressure.   Staying in the hospital, if your condition is severe. There, treatment will focus on controlling your blood pressure and the amount of fluids in your body (fluid retention).   Taking medicine (magnesium sulfate) to prevent seizures. This may be given as an injection or through an IV.   Taking a low-dose aspirin during your pregnancy.   Delivering your baby early, if your condition gets worse. You may have your labor started with medicine (induced), or you may have a cesarean   delivery.  Follow these instructions at home:  Eating and drinking     Drink enough fluid to keep your urine pale yellow.   Avoid caffeine.  Lifestyle   Do not use any products that contain nicotine or tobacco, such as cigarettes and e-cigarettes. If you need help quitting, ask your health care provider.   Do not use alcohol or drugs.   Avoid stress as much as possible. Rest and get plenty of sleep.  General instructions   Take over-the-counter and prescription medicines only as  told by your health care provider.   When lying down, lie on your left side. This keeps pressure off your major blood vessels.   When sitting or lying down, raise (elevate) your feet. Try putting some pillows underneath your lower legs.   Exercise regularly. Ask your health care provider what kinds of exercise are best for you.   Keep all follow-up and prenatal visits as told by your health care provider. This is important.  How is this prevented?  There is no known way of preventing preeclampsia or eclampsia from developing. However, to lower your risk of complications and detect problems early:   Get regular prenatal care. Your health care provider may be able to diagnose and treat the condition early.   Maintain a healthy weight. Ask your health care provider for help managing weight gain during pregnancy.   Work with your health care provider to manage any long-term (chronic) health conditions you have, such as diabetes or kidney problems.   You may have tests of your blood pressure and kidney function after giving birth.   Your health care provider may have you take low-dose aspirin during your next pregnancy.  Contact a health care provider if:   You have symptoms that your health care provider told you may require more treatment or monitoring, such as:  ? Headaches.  ? Nausea or vomiting.  ? Abdominal pain.  ? Dizziness.  ? Light-headedness.  Get help right away if:   You have severe:  ? Abdominal pain.  ? Headaches that do not get better.  ? Dizziness.  ? Vision problems.  ? Confusion.  ? Nausea or vomiting.   You have any of the following:  ? A seizure.  ? Sudden, rapid weight gain.  ? Sudden swelling in your hands, ankles, or face.  ? Trouble moving any part of your body.  ? Numbness in any part of your body.  ? Trouble speaking.  ? Abnormal bleeding.   You faint.  Summary   Preeclampsia is a serious condition that may develop during pregnancy. It is also called toxemia of pregnancy.   This  condition causes high blood pressure along with other symptoms, such as swelling and headaches.   Diagnosing and treating preeclampsia early is very important. If not treated early, it can cause serious problems for you and your baby.   Get help right away if you have symptoms that your health care provider told you to watch for.  This information is not intended to replace advice given to you by your health care provider. Make sure you discuss any questions you have with your health care provider.  Document Released: 09/15/2000 Document Revised: 09/04/2017 Document Reviewed: 04/24/2016  Elsevier Interactive Patient Education  2019 [employer]

## 2018-12-06 NOTE — Procedures (Signed)
[name] November 03, 1985 [redacted]w[redacted]d  Fetus A Non-Stress Test Interpretation for 12/06/18  Indication: Unsatisfactory BPP  Fetal Heart Rate A Mode: External Baseline Rate (A): 135 bpm Variability: Moderate Accelerations: 15 x 15, 10 x 10 Decelerations: None Multiple birth?: No  Uterine Activity Mode: Palpation, Toco Contraction Frequency (min): U/I Contraction Duration (sec): 20-30 Contraction Quality: Mild Resting Tone Palpated: Relaxed Resting Time: Adequate  Interpretation (Fetal Testing) Nonstress Test Interpretation: Reactive Overall Impression: Reassuring for gestational age Comments: Reviewed tracing with Dr. Grace Bushy

## 2018-12-10 ENCOUNTER — Telehealth: Payer: Self-pay

## 2018-12-10 NOTE — Telephone Encounter (Signed)
Pt called the office stating that she is scheduled for a BP check to today. Pt states that she will not be able to come in because of work schedule. Pt states that she will wait to come in for her ROB visit on 12/12/10.  Cesar Alf l Aariz Maish, CMA

## 2018-12-12 ENCOUNTER — Ambulatory Visit (INDEPENDENT_AMBULATORY_CARE_PROVIDER_SITE_OTHER): Payer: BLUE CROSS/BLUE SHIELD | Admitting: Family Medicine

## 2018-12-12 ENCOUNTER — Other Ambulatory Visit: Payer: Self-pay

## 2018-12-12 VITALS — BP 151/98 | HR 101 | Wt 240.0 lb

## 2018-12-12 DIAGNOSIS — O10913 Unspecified pre-existing hypertension complicating pregnancy, third trimester: Secondary | ICD-10-CM

## 2018-12-12 DIAGNOSIS — O0991 Supervision of high risk pregnancy, unspecified, first trimester: Secondary | ICD-10-CM

## 2018-12-12 DIAGNOSIS — O24113 Pre-existing diabetes mellitus, type 2, in pregnancy, third trimester: Secondary | ICD-10-CM

## 2018-12-12 DIAGNOSIS — O10919 Unspecified pre-existing hypertension complicating pregnancy, unspecified trimester: Secondary | ICD-10-CM

## 2018-12-12 DIAGNOSIS — O0993 Supervision of high risk pregnancy, unspecified, third trimester: Secondary | ICD-10-CM

## 2018-12-12 DIAGNOSIS — Z3A34 34 weeks gestation of pregnancy: Secondary | ICD-10-CM

## 2018-12-12 MED ORDER — LABETALOL HCL 200 MG PO TABS: 400.0000 mg | ORAL_TABLET | Freq: Three times a day (TID) | ORAL | 2 refills | Status: DC

## 2018-12-12 NOTE — Progress Notes (Signed)
Subjective:  [name] is a 33 y.o. G1P0000 at [redacted]w[redacted]d being seen today for ongoing prenatal care.  She is currently monitored for the following issues for this high-risk pregnancy and has BMI 33.0-33.9,adult; Chronic hypertension affecting pregnancy; Type 2 diabetes mellitus during pregnancy; Supervision of high risk pregnancy, antepartum, first trimester; Proteinuria affecting pregnancy; and Pap smear abnormality of cervix/human papillomavirus (HPV) positive on their problem list.  GDM: Patient taking metformin and insulin.  Reports a few hypoglycemic episodes in AM (fasting) after increasing.  Tolerating medication well Fasting: controlled 2hr PP: mostly controlled. Occasionally 120-123 after dinner  Patient reports no complaints.  Contractions: Not present.  .  Movement: Present. Denies leaking of fluid.   The following portions of the patient's history were reviewed and updated as appropriate: allergies, current medications, past family history, past medical history, past social history, past surgical history and problem list. Problem list updated.  Objective:   Vitals:   12/12/18 1117 12/12/18 1118  BP: 140/88 (!) 151/98  Pulse: (!) 101   Weight: 240 lb (108.9 kg)     Fetal Status:     Movement: Present     General:  Alert, oriented and cooperative. Patient is in no acute distress.  Skin: Skin is warm and dry. No rash noted.   Cardiovascular: Normal heart rate noted  Respiratory: Normal respiratory effort, no problems with respiration noted  Abdomen: Soft, gravid, appropriate for gestational age. Pain/Pressure: Present     Pelvic:       Cervical exam deferred        Extremities: Normal range of motion.  Edema: None  Mental Status: Normal mood and affect. Normal behavior. Normal judgment and thought content.   Urinalysis:      Assessment and Plan:  Pregnancy: G1P0000 at [redacted]w[redacted]d  1. Supervision of high risk pregnancy, antepartum, first trimester FHT and FH normal  2.  Pregnancy with type 2 diabetes mellitus in third trimester Continue insulin BPP tomorrow.  3. Chronic hypertension affecting pregnancy Increase labetalol to 400mg  TID.  Preterm labor symptoms and general obstetric precautions including but not limited to vaginal bleeding, contractions, leaking of fluid and fetal movement were reviewed in detail with the patient. Please refer to After Visit Summary for other counseling recommendations.  No follow-ups on file.   Levie Heritage, DO

## 2018-12-13 ENCOUNTER — Other Ambulatory Visit (HOSPITAL_COMMUNITY): Payer: Self-pay | Admitting: Obstetrics and Gynecology

## 2018-12-13 ENCOUNTER — Ambulatory Visit (HOSPITAL_COMMUNITY): Payer: BLUE CROSS/BLUE SHIELD | Admitting: *Deleted

## 2018-12-13 ENCOUNTER — Ambulatory Visit (HOSPITAL_COMMUNITY)
Admission: RE | Admit: 2018-12-13 | Discharge: 2018-12-13 | Disposition: A | Discharge status: Home / Self Care | Payer: BLUE CROSS/BLUE SHIELD | Source: Ambulatory Visit | Attending: Obstetrics and Gynecology | Admitting: Obstetrics and Gynecology

## 2018-12-13 ENCOUNTER — Encounter (HOSPITAL_COMMUNITY): Payer: Self-pay

## 2018-12-13 ENCOUNTER — Other Ambulatory Visit (HOSPITAL_COMMUNITY): Payer: Self-pay | Admitting: *Deleted

## 2018-12-13 VITALS — BP 156/95 | HR 98

## 2018-12-13 VITALS — BP 166/101 | HR 101 | Wt 243.8 lb

## 2018-12-13 DIAGNOSIS — O0991 Supervision of high risk pregnancy, unspecified, first trimester: Secondary | ICD-10-CM

## 2018-12-13 DIAGNOSIS — Z3A34 34 weeks gestation of pregnancy: Secondary | ICD-10-CM

## 2018-12-13 DIAGNOSIS — O10013 Pre-existing essential hypertension complicating pregnancy, third trimester: Secondary | ICD-10-CM

## 2018-12-13 DIAGNOSIS — E119 Type 2 diabetes mellitus without complications: Secondary | ICD-10-CM | POA: Diagnosis not present

## 2018-12-13 DIAGNOSIS — O24113 Pre-existing diabetes mellitus, type 2, in pregnancy, third trimester: Secondary | ICD-10-CM

## 2018-12-13 DIAGNOSIS — O99213 Obesity complicating pregnancy, third trimester: Secondary | ICD-10-CM | POA: Diagnosis not present

## 2018-12-13 DIAGNOSIS — O10919 Unspecified pre-existing hypertension complicating pregnancy, unspecified trimester: Secondary | ICD-10-CM

## 2018-12-13 DIAGNOSIS — Z362 Encounter for other antenatal screening follow-up: Secondary | ICD-10-CM

## 2018-12-13 NOTE — Procedures (Signed)
[name] 07/22/1986 [redacted]w[redacted]d  Fetus A Non-Stress Test Interpretation for 12/13/18  Indication: Unsatisfactory BPP  Fetal Heart Rate A Mode: External Baseline Rate (A): 135 bpm Variability: Moderate Accelerations: 15 x 15 Decelerations: None Multiple birth?: No  Uterine Activity Mode: Palpation, Toco Contraction Frequency (min): U/I Contraction Duration (sec): 30-40 Contraction Quality: Mild(denies feeling ) Resting Tone Palpated: Relaxed Resting Time: Adequate  Interpretation (Fetal Testing) Nonstress Test Interpretation: Reactive Overall Impression: Reassuring for gestational age Comments: Reviewed tracing with Dr. Judeth Cornfield

## 2018-12-16 ENCOUNTER — Encounter (HOSPITAL_COMMUNITY): Payer: Self-pay

## 2018-12-16 ENCOUNTER — Inpatient Hospital Stay (HOSPITAL_COMMUNITY)
Admission: AD | Admit: 2018-12-16 | Discharge: 2018-12-16 | Disposition: A | Discharge status: Home / Self Care | Payer: BLUE CROSS/BLUE SHIELD | Attending: Obstetrics and Gynecology | Admitting: Obstetrics and Gynecology

## 2018-12-16 ENCOUNTER — Other Ambulatory Visit: Payer: Self-pay

## 2018-12-16 DIAGNOSIS — Z3A35 35 weeks gestation of pregnancy: Secondary | ICD-10-CM | POA: Insufficient documentation

## 2018-12-16 DIAGNOSIS — Z87891 Personal history of nicotine dependence: Secondary | ICD-10-CM | POA: Diagnosis not present

## 2018-12-16 DIAGNOSIS — O10013 Pre-existing essential hypertension complicating pregnancy, third trimester: Secondary | ICD-10-CM | POA: Diagnosis not present

## 2018-12-16 DIAGNOSIS — R1030 Lower abdominal pain, unspecified: Secondary | ICD-10-CM | POA: Diagnosis not present

## 2018-12-16 DIAGNOSIS — O119 Pre-existing hypertension with pre-eclampsia, unspecified trimester: Secondary | ICD-10-CM | POA: Diagnosis present

## 2018-12-16 DIAGNOSIS — Z7982 Long term (current) use of aspirin: Secondary | ICD-10-CM | POA: Insufficient documentation

## 2018-12-16 DIAGNOSIS — O113 Pre-existing hypertension with pre-eclampsia, third trimester: Secondary | ICD-10-CM | POA: Diagnosis not present

## 2018-12-16 LAB — URINALYSIS, ROUTINE W REFLEX MICROSCOPIC
Bacteria, UA: NONE SEEN
Bilirubin Urine: NEGATIVE
Glucose, UA: NEGATIVE mg/dL
Ketones, ur: 20 mg/dL — AB
Leukocytes,Ua: NEGATIVE
Nitrite: NEGATIVE
Protein, ur: 100 mg/dL — AB
SPECIFIC GRAVITY, URINE: 1.01 (ref 1.005–1.030)
pH: 6 (ref 5.0–8.0)

## 2018-12-16 LAB — PROTEIN / CREATININE RATIO, URINE
Creatinine, Urine: 43.88 mg/dL
Protein Creatinine Ratio: 6.59 mg/mg{Cre} — ABNORMAL HIGH (ref 0.00–0.15)
Total Protein, Urine: 289 mg/dL

## 2018-12-16 LAB — COMPREHENSIVE METABOLIC PANEL
ALT: 18 U/L (ref 0–44)
AST: 23 U/L (ref 15–41)
Albumin: 2.1 g/dL — ABNORMAL LOW (ref 3.5–5.0)
Alkaline Phosphatase: 119 U/L (ref 38–126)
Anion gap: 7 (ref 5–15)
BUN: 11 mg/dL (ref 6–20)
CO2: 19 mmol/L — ABNORMAL LOW (ref 22–32)
Calcium: 8.6 mg/dL — ABNORMAL LOW (ref 8.9–10.3)
Chloride: 106 mmol/L (ref 98–111)
Creatinine, Ser: 0.56 mg/dL (ref 0.44–1.00)
GFR calc Af Amer: >60 mL/min (ref >60)
GFR calc non Af Amer: >60 mL/min (ref >60)
Glucose, Bld: 142 mg/dL — ABNORMAL HIGH (ref 70–99)
Potassium: 3.8 mmol/L (ref 3.5–5.1)
Sodium: 132 mmol/L — ABNORMAL LOW (ref 135–145)
Total Bilirubin: 0.6 mg/dL (ref 0.3–1.2)
Total Protein: 5.7 g/dL — ABNORMAL LOW (ref 6.5–8.1)

## 2018-12-16 LAB — CBC
HCT: 34.6 % — ABNORMAL LOW (ref 36.0–46.0)
Hemoglobin: 11.7 g/dL — ABNORMAL LOW (ref 12.0–15.0)
MCH: 28.7 pg (ref 26.0–34.0)
MCHC: 33.8 g/dL (ref 30.0–36.0)
MCV: 84.8 fL (ref 80.0–100.0)
Platelets: 376 10*3/uL (ref 150–400)
RBC: 4.08 MIL/uL (ref 3.87–5.11)
RDW: 13 % (ref 11.5–15.5)
WBC: 7.8 10*3/uL (ref 4.0–10.5)
nRBC: 0 % (ref 0.0–0.2)

## 2018-12-16 MED ORDER — LABETALOL HCL 5 MG/ML IV SOLN: 80.0000 mg | INTRAVENOUS | Status: DC | PRN

## 2018-12-16 MED ORDER — LABETALOL HCL 300 MG PO TABS: 600.0000 mg | ORAL_TABLET | Freq: Three times a day (TID) | ORAL | 0 refills | Status: DC

## 2018-12-16 MED ORDER — LABETALOL HCL 5 MG/ML IV SOLN
20.0000 mg | INTRAVENOUS | Status: DC | PRN
  Administered 2018-12-16: 20 mg via INTRAVENOUS
  Filled 2018-12-16: qty 4

## 2018-12-16 MED ORDER — HYDRALAZINE HCL 20 MG/ML IJ SOLN: 10.0000 mg | INTRAMUSCULAR | Status: DC | PRN

## 2018-12-16 MED ORDER — LACTATED RINGERS IV SOLN
INTRAVENOUS | Status: DC
  Administered 2018-12-16: 15:00:00 via INTRAVENOUS

## 2018-12-16 MED ORDER — LABETALOL HCL 5 MG/ML IV SOLN
40.0000 mg | INTRAVENOUS | Status: DC | PRN
  Filled 2018-12-16: qty 8

## 2018-12-16 NOTE — MAU Provider Note (Signed)
History     CSN: [id number]  Arrival date and time: 12/16/18 1108   First Provider Initiated Contact with Patient 12/16/18 1217      Chief Complaint  Patient presents with  . Abdominal Pain   HPI  Ms.  [name] is a 33 y.o. year old [id number] female at [redacted]w[redacted]d weeks gestation who presents to MAU reporting lower abdominal pain/cramping that started while she was at work today @ 0600. She felt a sharp pain in mid/lower abdominal area while lifting "something heavy" at work. She reports the pain has been intermittent since then. She took a hot shower around 0930 and the pain went away. She felt another pain about 1030 on the way here. She denies H/A, epigastric pain, VB or LOF. She reports good (+) FM today.  Past Medical History:  Diagnosis Date  . Diabetes mellitus without complication (HCC)    Type II  . Hypertension     Past Surgical History:  Procedure Laterality Date  . addnoids    . TONSILLECTOMY AND ADENOIDECTOMY      Family History  Problem Relation Age of Onset  . Diabetes Mother   . Hypertension Mother   . Hypertension Maternal Grandmother   . Diabetes Maternal Aunt   . Diabetes Maternal Uncle     Social History   Tobacco Use  . Smoking status: Former Smoker    Years: 0.00    Last attempt to quit: 03/30/2006    Years since quitting: 12.7  . Smokeless tobacco: Never Used  Substance Use Topics  . Alcohol use: No  . Drug use: No    Allergies:  Allergies  Allergen Reactions  . Almond (Diagnostic) Hives  . Bee Venom Hives    Medications Prior to Admission  Medication Sig Dispense Refill Last Dose  . aspirin EC 81 MG tablet Take 1 tablet (81 mg total) by mouth daily. Take after 12 weeks for prevention of preeclampsia later in pregnancy 300 tablet 2 Taking  . cyclobenzaprine (FLEXERIL) 10 MG tablet Take 1 tablet (10 mg total) by mouth 3 (three) times daily as needed for muscle spasms. 30 tablet 2 Taking  . Insulin Glargine (LANTUS SOLOSTAR) 100 UNIT/ML  Solostar Pen Inject 50-60 Units into the skin 2 (two) times daily. 60 units in AM and 50 units at bedtime 15 mL 11 Taking  . Insulin Lispro (HUMALOG KWIKPEN) 200 UNIT/ML SOPN Inject 10-15 Units into the skin 3 (three) times daily before meals. 07/11/14 uints 3 mL 3 Taking  . Insulin Pen Needle (PEN NEEDLES) 31G X 5 MM MISC 1 each by Does not apply route 5 (five) times daily as needed. 150 each 2 Taking  . labetalol (NORMODYNE) 200 MG tablet Take 2 tablets (400 mg total) by mouth 3 (three) times daily for 30 days. 180 tablet 2 Taking  . metFORMIN (GLUCOPHAGE) 1000 MG tablet Take 0.5 tablets (500 mg total) by mouth 3 (three) times daily with meals. 180 tablet 3 Taking  . metoCLOPramide (REGLAN) 10 MG tablet Take 10 mg by mouth 4 (four) times daily.   Taking  . omeprazole (PRILOSEC) 10 MG capsule Take 10 mg by mouth daily.   Taking  . ondansetron (ZOFRAN ODT) 4 MG disintegrating tablet Take 1 tablet (4 mg total) by mouth every 6 (six) hours as needed for nausea. (Patient not taking: Reported on 11/29/2018) 20 tablet 2 Not Taking  . Prenatal Multivit-Min-Fe-FA (PRENATAL VITAMINS PO) Take by mouth.   Taking    Review of Systems  Constitutional: Negative.   HENT: Negative.   Eyes: Negative.   Respiratory: Negative.   Cardiovascular: Negative.   Gastrointestinal: Negative.   Endocrine: Negative.   Genitourinary: Positive for pelvic pain (intermittent sharp pains from belly button down to pubic bone).  Musculoskeletal: Negative.   Skin: Negative.   Allergic/Immunologic: Negative.   Neurological: Negative.   Hematological: Negative.   Psychiatric/Behavioral: Negative.    Physical Exam   Patient Vitals for the past 24 hrs:  BP Temp Temp src Pulse Resp SpO2 Height Weight  12/16/18 1631 135/90 - - (!) 101 - - - -  12/16/18 1615 (!) 139/95 - - (!) 104 - - - -  12/16/18 1600 (!) 125/92 - - (!) 103 - - - -  12/16/18 1546 (!) 143/91 - - (!) 103 - - - -  12/16/18 1531 (!) 144/90 - - (!) 102 - - - -   12/16/18 1516 (!) 144/98 - - (!) 102 - - - -  12/16/18 1501 131/88 - - (!) 101 - - - -  12/16/18 1416 (!) 151/99 - - (!) 101 - - - -  12/16/18 1406 (!) 143/92 - - (!) 101 - - - -  12/16/18 1401 (!) 161/105 - - (!) 101 - - - -  12/16/18 1346 (!) 156/107 - - 98 - - - -  12/16/18 1331 (!) 140/96 - - 99 - - - -  12/16/18 1316 (!) 141/98 - - 100 - - - -  12/16/18 1301 128/82 - - (!) 101 - - - -  12/16/18 1246 (!) 135/91 - - (!) 101 - - - -  12/16/18 1231 (!) 146/95 - - (!) 103 - - - -  12/16/18 1216 (!) 142/93 - - (!) 101 - - - -  12/16/18 1200 (!) 138/94 - - (!) 101 - - - -  12/16/18 1146 (!) 146/99 - - (!) 103 - - - -  12/16/18 1129 (!) 137/92 98 F (36.7 C) Oral (!) 107 18 100 % - -  12/16/18 1117 - - - - - - 5\' 7"  (1.702 m) 111.1 kg    Physical Exam  Nursing note and vitals reviewed. Constitutional: She is oriented to person, place, and time. She appears well-developed and well-nourished.  HENT:  Head: Normocephalic and atraumatic.  Eyes: Pupils are equal, round, and reactive to light.  Neck: Normal range of motion.  Cardiovascular: Normal rate and regular rhythm.  Respiratory: Effort normal.  GI: Soft.  Genitourinary:    Vagina normal.     Genitourinary Comments: Dilation: Closed Effacement (%): Thick Exam by: Arita Missawson, CNM    Musculoskeletal: Normal range of motion.  Neurological: She is alert and oriented to person, place, and time.  Skin: Skin is warm and dry.  Psychiatric: She has a normal mood and affect. Her behavior is normal. Judgment and thought content normal.    MAU Course  Procedures  MDM CCUA CBC CMP P/C Ratio IV Labetalol Protocol NST - FHR: 130 bpm / moderate variability / accels present / decels absent / TOCO: irregular UC's  Results for orders placed or performed during the hospital encounter of 12/16/18 (from the past 24 hour(s))  Urinalysis, Routine w reflex microscopic     Status: Abnormal   Collection Time: 12/16/18 11:25 AM  Result Value Ref  Range   Color, Urine STRAW (A) YELLOW   APPearance CLEAR CLEAR   Specific Gravity, Urine 1.010 1.005 - 1.030   pH 6.0 5.0 - 8.0  Glucose, UA NEGATIVE NEGATIVE mg/dL   Hgb urine dipstick SMALL (A) NEGATIVE   Bilirubin Urine NEGATIVE NEGATIVE   Ketones, ur 20 (A) NEGATIVE mg/dL   Protein, ur 683 (A) NEGATIVE mg/dL   Nitrite NEGATIVE NEGATIVE   Leukocytes,Ua NEGATIVE NEGATIVE   RBC / HPF 0-5 0 - 5 RBC/hpf   WBC, UA 0-5 0 - 5 WBC/hpf   Bacteria, UA NONE SEEN NONE SEEN   Squamous Epithelial / LPF 0-5 0 - 5  Comprehensive metabolic panel     Status: Abnormal   Collection Time: 12/16/18  2:29 PM  Result Value Ref Range   Sodium 132 (L) 135 - 145 mmol/L   Potassium 3.8 3.5 - 5.1 mmol/L   Chloride 106 98 - 111 mmol/L   CO2 19 (L) 22 - 32 mmol/L   Glucose, Bld 142 (H) 70 - 99 mg/dL   BUN 11 6 - 20 mg/dL   Creatinine, Ser 4.19 0.44 - 1.00 mg/dL   Calcium 8.6 (L) 8.9 - 10.3 mg/dL   Total Protein 5.7 (L) 6.5 - 8.1 g/dL   Albumin 2.1 (L) 3.5 - 5.0 g/dL   AST 23 15 - 41 U/L   ALT 18 0 - 44 U/L   Alkaline Phosphatase 119 38 - 126 U/L   Total Bilirubin 0.6 0.3 - 1.2 mg/dL   GFR calc non Af Amer >60 >60 mL/min   GFR calc Af Amer >60 >60 mL/min   Anion gap 7 5 - 15  CBC     Status: Abnormal   Collection Time: 12/16/18  2:29 PM  Result Value Ref Range   WBC 7.8 4.0 - 10.5 K/uL   RBC 4.08 3.87 - 5.11 MIL/uL   Hemoglobin 11.7 (L) 12.0 - 15.0 g/dL   HCT 62.2 (L) 29.7 - 98.9 %   MCV 84.8 80.0 - 100.0 fL   MCH 28.7 26.0 - 34.0 pg   MCHC 33.8 30.0 - 36.0 g/dL   RDW 21.1 94.1 - 74.0 %   Platelets 376 150 - 400 K/uL   nRBC 0.0 0.0 - 0.2 %  Protein / creatinine ratio, urine     Status: Abnormal   Collection Time: 12/16/18  2:29 PM  Result Value Ref Range   Creatinine, Urine 43.88 mg/dL   Total Protein, Urine 289 mg/dL   Protein Creatinine Ratio 6.59 (H) 0.00 - 0.15 mg/mg[Cre]    *Consult with Dr. Earlene Plater @ 1632 - notified of patient's complaints, assessments, lab & NST results, recommended  tx plan d/c home with BP recheck in 2 days, 24 hr urine collection, increase Labetalol dose, given PEC precautions - ok to d/c home  Assessment and Plan  Chronic hypertension with superimposed preeclampsia - Plan: Discharge patient - Return to MAU on Wednesday 12/18/2018 for BP recheck and to return her 24 hr urine - Information provided on HTN in pregnancy and PEC - Keep scheduled appt with [facility] on Thursday 12/19/2018 - Patient verbalized an understanding of the plan of care and agrees.    Raelyn Mora, MSN, CNM 12/16/2018, 12:17 PM

## 2018-12-16 NOTE — MAU Note (Signed)
Pt presents to MAU with c/o lower abdominal pain that started today around 6 am. She felt sharp pain in mid/lower abdomen at work after lifting and since then has felt intermittent pain. She denies VB and LOF. +FM

## 2018-12-18 ENCOUNTER — Other Ambulatory Visit: Payer: Self-pay

## 2018-12-18 ENCOUNTER — Encounter (HOSPITAL_COMMUNITY): Payer: Self-pay | Admitting: *Deleted

## 2018-12-18 ENCOUNTER — Inpatient Hospital Stay (HOSPITAL_COMMUNITY)
Admission: AD | Admit: 2018-12-18 | Discharge: 2018-12-18 | Disposition: A | Discharge status: Home / Self Care | Payer: BLUE CROSS/BLUE SHIELD | Attending: Obstetrics and Gynecology | Admitting: Obstetrics and Gynecology

## 2018-12-18 DIAGNOSIS — Z794 Long term (current) use of insulin: Secondary | ICD-10-CM | POA: Insufficient documentation

## 2018-12-18 DIAGNOSIS — O0993 Supervision of high risk pregnancy, unspecified, third trimester: Secondary | ICD-10-CM | POA: Diagnosis not present

## 2018-12-18 DIAGNOSIS — Z87891 Personal history of nicotine dependence: Secondary | ICD-10-CM | POA: Diagnosis not present

## 2018-12-18 DIAGNOSIS — Z79899 Other long term (current) drug therapy: Secondary | ICD-10-CM | POA: Insufficient documentation

## 2018-12-18 DIAGNOSIS — Z7982 Long term (current) use of aspirin: Secondary | ICD-10-CM | POA: Insufficient documentation

## 2018-12-18 DIAGNOSIS — O163 Unspecified maternal hypertension, third trimester: Secondary | ICD-10-CM | POA: Insufficient documentation

## 2018-12-18 DIAGNOSIS — Z833 Family history of diabetes mellitus: Secondary | ICD-10-CM | POA: Diagnosis not present

## 2018-12-18 DIAGNOSIS — O24113 Pre-existing diabetes mellitus, type 2, in pregnancy, third trimester: Secondary | ICD-10-CM | POA: Insufficient documentation

## 2018-12-18 DIAGNOSIS — O113 Pre-existing hypertension with pre-eclampsia, third trimester: Secondary | ICD-10-CM

## 2018-12-18 DIAGNOSIS — O1213 Gestational proteinuria, third trimester: Secondary | ICD-10-CM | POA: Diagnosis not present

## 2018-12-18 DIAGNOSIS — O10919 Unspecified pre-existing hypertension complicating pregnancy, unspecified trimester: Secondary | ICD-10-CM

## 2018-12-18 DIAGNOSIS — Z91030 Bee allergy status: Secondary | ICD-10-CM | POA: Diagnosis not present

## 2018-12-18 DIAGNOSIS — E119 Type 2 diabetes mellitus without complications: Secondary | ICD-10-CM | POA: Diagnosis not present

## 2018-12-18 DIAGNOSIS — Z8249 Family history of ischemic heart disease and other diseases of the circulatory system: Secondary | ICD-10-CM | POA: Diagnosis not present

## 2018-12-18 DIAGNOSIS — O0991 Supervision of high risk pregnancy, unspecified, first trimester: Secondary | ICD-10-CM

## 2018-12-18 DIAGNOSIS — Z91018 Allergy to other foods: Secondary | ICD-10-CM | POA: Diagnosis not present

## 2018-12-18 DIAGNOSIS — Z3A35 35 weeks gestation of pregnancy: Secondary | ICD-10-CM | POA: Diagnosis not present

## 2018-12-18 DIAGNOSIS — O119 Pre-existing hypertension with pre-eclampsia, unspecified trimester: Secondary | ICD-10-CM

## 2018-12-18 LAB — URINALYSIS, ROUTINE W REFLEX MICROSCOPIC
Bilirubin Urine: NEGATIVE
Glucose, UA: NEGATIVE mg/dL
Hgb urine dipstick: NEGATIVE
Ketones, ur: 5 mg/dL — AB
Leukocytes,Ua: NEGATIVE
Nitrite: NEGATIVE
Protein, ur: 100 mg/dL — AB
Specific Gravity, Urine: 1.01 (ref 1.005–1.030)
pH: 6 (ref 5.0–8.0)

## 2018-12-18 LAB — CREATININE, URINE, 24 HOUR
Collection Interval-UCRE24: 24 hours
Creatinine, 24H Ur: 1325 mg/d (ref 600–1800)
Creatinine, Urine: 65.41 mg/dL
Urine Total Volume-UCRE24: 2025 mL

## 2018-12-18 LAB — PROTEIN, URINE, 24 HOUR
Collection Interval-UPROT: 24 hours
Protein, 24H Urine: 6480 mg/d — ABNORMAL HIGH (ref 50–100)
Protein, Urine: 320 mg/dL
Urine Total Volume-UPROT: 2025 mL

## 2018-12-18 NOTE — MAU Note (Signed)
Here as instructed for BP check and drop off 24hr urine. Denies and problems, denies HA, visual changes, epigastric pain or increase in swelling. No bleeding or leaking.

## 2018-12-18 NOTE — MAU Provider Note (Signed)
History     CSN: [id number]  Arrival date and time: 12/18/18 1148   First Provider Initiated Contact with Patient 12/18/18 1244      Chief Complaint  Patient presents with  . Blood Pressure Check   Ms. [name] is a 33 y.o. G1P0000 at [redacted]w[redacted]d who presents to MAU for preeclampsia evaluation. Pt was seen in MAU on 12/16/2018, was dx with cHTN with super-imposed preeclampsia, and sent home with a BP medication adjustment. Pt was instructed to report to clinic today for BP check and to drop-off 24hr urine collection, but pt understood that she was to return to MAU.  Pt denies HA, blurry vision/seeing spots, N/V, epigastric pain, swelling in face and hands sudden weight gain. Pt denies chest pain and SOB.  Pt denies VB, ctx, LOF and reports good FM.  Pt reports is taking 600mg  labetalol TID as prescribed. Takes at 4am, 11am, 9pm d/t work schedule 4am-1pm.  Current PNC & next appt? [facility], tomorrow    OB History    Gravida  1   Para  0   Term  0   Preterm  0   AB  0   Living  0     SAB  0   TAB  0   Ectopic  0   Multiple  0   Live Births  0           Past Medical History:  Diagnosis Date  . Diabetes mellitus without complication (HCC)    Type II  . Hypertension     Past Surgical History:  Procedure Laterality Date  . addnoids    . TONSILLECTOMY AND ADENOIDECTOMY      Family History  Problem Relation Age of Onset  . Diabetes Mother   . Hypertension Mother   . Hypertension Maternal Grandmother   . Diabetes Maternal Aunt   . Diabetes Maternal Uncle     Social History   Tobacco Use  . Smoking status: Former Smoker    Years: 0.00    Last attempt to quit: 03/30/2006    Years since quitting: 12.7  . Smokeless tobacco: Never Used  Substance Use Topics  . Alcohol use: No  . Drug use: No    Allergies:  Allergies  Allergen Reactions  . Almond (Diagnostic) Hives  . Bee Venom Hives    Medications Prior to Admission  Medication Sig  Dispense Refill Last Dose  . aspirin EC 81 MG tablet Take 1 tablet (81 mg total) by mouth daily. Take after 12 weeks for prevention of preeclampsia later in pregnancy 300 tablet 2 Taking  . cyclobenzaprine (FLEXERIL) 10 MG tablet Take 1 tablet (10 mg total) by mouth 3 (three) times daily as needed for muscle spasms. 30 tablet 2 Taking  . Insulin Glargine (LANTUS SOLOSTAR) 100 UNIT/ML Solostar Pen Inject 50-60 Units into the skin 2 (two) times daily. 60 units in AM and 50 units at bedtime 15 mL 11 Taking  . Insulin Lispro (HUMALOG KWIKPEN) 200 UNIT/ML SOPN Inject 10-15 Units into the skin 3 (three) times daily before meals. 07/11/14 uints 3 mL 3 Taking  . Insulin Pen Needle (PEN NEEDLES) 31G X 5 MM MISC 1 each by Does not apply route 5 (five) times daily as needed. 150 each 2 Taking  . labetalol (NORMODYNE) 300 MG tablet Take 2 tablets (600 mg total) by mouth 3 (three) times daily for 30 days. 180 tablet 0   . metFORMIN (GLUCOPHAGE) 1000 MG tablet Take 0.5 tablets (500  mg total) by mouth 3 (three) times daily with meals. 180 tablet 3 Taking  . metoCLOPramide (REGLAN) 10 MG tablet Take 10 mg by mouth 4 (four) times daily.   Taking  . omeprazole (PRILOSEC) 10 MG capsule Take 10 mg by mouth daily.   Taking  . ondansetron (ZOFRAN ODT) 4 MG disintegrating tablet Take 1 tablet (4 mg total) by mouth every 6 (six) hours as needed for nausea. (Patient not taking: Reported on 11/29/2018) 20 tablet 2 Not Taking  . Prenatal Multivit-Min-Fe-FA (PRENATAL VITAMINS PO) Take by mouth.   Taking    Review of Systems  Respiratory: Negative for shortness of breath.   Cardiovascular: Negative for chest pain.  Gastrointestinal: Negative for abdominal pain.  Genitourinary: Negative for pelvic pain, vaginal bleeding and vaginal discharge.  Neurological: Negative for headaches.   Physical Exam   Blood pressure 131/79, pulse 93, temperature 98.3 F (36.8 C), temperature source Oral, resp. rate 18, weight 110.9 kg, last  menstrual period 04/05/2018, SpO2 100 %.  Patient Vitals for the past 24 hrs:  BP Temp Temp src Pulse Resp SpO2 Weight  12/18/18 1316 131/79 - - 93 - - -  12/18/18 1301 (!) 157/98 - - 91 - - -  12/18/18 1245 (!) 157/100 - - 93 - - -  12/18/18 1231 (!) 152/99 - - 94 - - -  12/18/18 1227 (!) 152/97 - - 92 - - -  12/18/18 1205 (!) 157/102 98.3 F (36.8 C) Oral 95 18 100 % 110.9 kg    Physical Exam  Constitutional: She is oriented to person, place, and time. She appears well-developed and well-nourished. No distress.  HENT:  Head: Normocephalic.  Respiratory: Effort normal.  GI: Soft. She exhibits no distension and no mass. There is no abdominal tenderness. There is no rebound and no guarding.  Neurological: She is alert and oriented to person, place, and time. She has normal reflexes.  Skin: Skin is warm and dry. She is not diaphoretic.  Psychiatric: She has a normal mood and affect. Her behavior is normal.   Results for orders placed or performed during the hospital encounter of 12/18/18 (from the past 24 hour(s))  Urinalysis, Routine w reflex microscopic     Status: Abnormal   Collection Time: 12/18/18 12:33 PM  Result Value Ref Range   Color, Urine YELLOW YELLOW   APPearance CLEAR CLEAR   Specific Gravity, Urine 1.010 1.005 - 1.030   pH 6.0 5.0 - 8.0   Glucose, UA NEGATIVE NEGATIVE mg/dL   Hgb urine dipstick NEGATIVE NEGATIVE   Bilirubin Urine NEGATIVE NEGATIVE   Ketones, ur 5 (A) NEGATIVE mg/dL   Protein, ur 161 (A) NEGATIVE mg/dL   Nitrite NEGATIVE NEGATIVE   Leukocytes,Ua NEGATIVE NEGATIVE   RBC / HPF 0-5 0 - 5 RBC/hpf   WBC, UA 0-5 0 - 5 WBC/hpf   Bacteria, UA RARE (A) NONE SEEN   Squamous Epithelial / LPF 0-5 0 - 5    [place] Mfm Fetal Bpp W/nonstress  Result Date: 12/13/2018 ----------------------------------------------------------------------  OBSTETRICS REPORT                       (Signed Final 12/13/2018 11:58 am)  ---------------------------------------------------------------------- Patient Info  ID #:       [id number]                          D.O.B.:  25-Jan-1986 (32 yrs)  Name:  [name]                   Visit Date: 12/13/2018 09:28 am ---------------------------------------------------------------------- Performed By  Performed By:     Apolonio Schneiders Sweat         Ref. Address:     [address]                                                             [place], [place]                                                             [address]  Attending:        Noralee Space MD        Location[facility]  Referred By:      Tereso Newcomer MD ---------------------------------------------------------------------- Orders   #  Description                          Code         Ordered By   1  [place] MFM OB FOLLOW UP                  60454.09     RAVI SHANKAR   2  [place] MFM FETAL BPP                     81191.4      RAVI [facility]      W/NONSTRESS  ----------------------------------------------------------------------   #  Order #                    Accession #                 Episode #   1  [id number]                  [id number]                  [id number]   2  [id number]  [id number]                  [id number]  ---------------------------------------------------------------------- Indications   Obesity complicating pregnancy, third          O99.212   trimester (Pre Pregnancy BMI 31.5)   Hypertension - Chronic/Pre-existing            O10.019   Pre-existing diabetes, type 2, in pregnancy,   O24.112   third trimester (Insulin/metformin) NML   ECHO   [redacted] weeks gestation of pregnancy                Z3A.34  ---------------------------------------------------------------------- Vital Signs                                                  Height:        5'7" ---------------------------------------------------------------------- Fetal Evaluation  Num Of Fetuses:         1  Fetal Heart Rate(bpm):  130  Cardiac Activity:       Observed  Presentation:           Cephalic  Placenta:               Posterior Fundal  P. Cord Insertion:      Previously Visualized  Amniotic Fluid  AFI FV:      Within normal limits  AFI Sum(cm)     %Tile       Largest Pocket(cm)  15.97           58          5.43  RUQ(cm)       RLQ(cm)       LUQ(cm)        LLQ(cm)  3.31          5.43          3.57           3.66 ---------------------------------------------------------------------- Biophysical Evaluation  Amniotic F.V:   Within normal limits       F. Tone:        Observed  F. Movement:    Not Observed               N.S.T:          Reactive  F. Breathing:   Observed                   Score:          8/10 ---------------------------------------------------------------------- Biometry  BPD:      90.4  mm     G. Age:  36w 5d         93  %    CI:        78.47   %    70 - 86                                                          FL/HC:      20.0   %    20.1 - 22.3  HC:      322.8  mm     G. Age:  36w 3d         60  %  HC/AC:      0.97        0.93 - 1.11  AC:       333   mm     G. Age:  37w 1d       > 97  %    FL/BPD:     71.3   %    71 - 87  FL:       64.5  mm     G. Age:  33w 2d         11  %    FL/AC:      19.4   %    20 - 24  HUM:      60.9  mm     G. Age:  35w 2d         75  %  Est. FW:    2851  gm      6 lb 5 oz     83  % ---------------------------------------------------------------------- OB History  Gravidity:    1 ---------------------------------------------------------------------- Gestational Age  LMP:           36w 0d        Date:  04/05/18                 EDD:   01/10/19  U/S Today:     35w 6d                                        EDD:   01/11/19  Best:          34w 5d     Det. By:  Marcella Dubs         EDD:   01/19/19                                       (06/20/18) ---------------------------------------------------------------------- Anatomy  Thoracic:              Appears normal         Kidneys:                Not well visualized  Stomach:               Appears normal, left   Bladder:                Appears normal                         sided  Abdomen:               Appears normal ---------------------------------------------------------------------- Cervix Uterus Adnexa  Cervix  Not adaquately visualized  Uterus  No abnormality visualized.  Left Ovary  Not visualized.  Right Ovary  Not visualized.  Cul De Sac  No free fluid seen.  Adnexa  No abnormality visualized. ---------------------------------------------------------------------- Impression  Chronic hypertension: Labetalol dosage was increased to  400 mg tid yesterday and the patient will be picking up her  prescription to start today.  Diabetes: Patient takes lantus insulin 55 units in the morning  and 65 units in the evening. She takes Humalog 07/11/14  units with meals. She also takes metformin 500 mg three  times daily with meals. She reports her blood glucose levels  are within normal range  except occasional hyperglycemia  (post-dinner).  Blood pressures at our office today were 166/101 and 156/95  mm Hg.  She does not have symptoms of severe features of  preeclampsia.  On ultrasound, amniotic fluid is normal. Fetal growth is  appropriate for gestational age. Gross body movements did  not meet the criteria of BPP. NST is reactive. BPP 8/10.  I reassured the patient of the findings.  I encouraged her to take labetalol regularly.  Patient has co-morbid conditions of diabetes and  hypertension. Her blood pressures are not well controlled  now and requires increasing dosage of labetalol.  I recommend delivery at 37 weeks to prevent adverse  maternal and fetal outcomes. ---------------------------------------------------------------------- Recommendations  -Continue weekly BPP and NST till delivery.   -Delivery at 37 weeks. ----------------------------------------------------------------------                  Noralee Space, MD Electronically Signed Final Report   12/13/2018 11:58 am ----------------------------------------------------------------------  [place] Mfm Fetal Bpp W/nonstress  Result Date: 12/06/2018 ----------------------------------------------------------------------  OBSTETRICS REPORT                         (Signed Final 12/06/2018 03:27 pm) ---------------------------------------------------------------------- Patient Info  ID #:        [id number]                          D.O.B.:  January 12, 1986 (32 yrs)  Name:        [name]                   Visit Date: 12/06/2018 10:21 am ---------------------------------------------------------------------- Performed By  Performed By:      Lenise Arena        Ref. Address:      [address]                                                               [place], [place]                                                               [address]  Attending:         Lin Landsman      Location[facility]                     MD                                        Fetal Care  Referred By:  Tereso Newcomer MD ---------------------------------------------------------------------- Orders   #  Description                           Code         Ordered By   1  [place] MFM FETAL BPP W/NONSTRESS          [address].6      Noralee Space  ----------------------------------------------------------------------   #  Order #                     Accession #                 Episode #   1  [id number]                   [id number]                  [id number]  ---------------------------------------------------------------------- Indications   Abnormal ultrasound finding on antenatal        O28.3   screening of mother   Obesity complicating pregnancy, third           O99.212    trimester (Pre Pregnancy BMI 31.5)   Hypertension - Chronic/Pre-existing             O10.019   Pre-existing diabetes, type 2, in pregnancy,    O24.112   third trimester (Insulin/metformin) NML ECHO   [redacted] weeks gestation of pregnancy                 Z3A.33  ---------------------------------------------------------------------- Vital Signs                                                  Height:        5'7" ---------------------------------------------------------------------- Fetal Evaluation  Num Of Fetuses:          1  Fetal Heart Rate(bpm):   132  Cardiac Activity:        Observed  Presentation:            Transverse, head to maternal right  Placenta:                Anterior  Amniotic Fluid  AFI FV:      Within normal limits  AFI Sum(cm)     %Tile       Largest Pocket(cm)  11.33           29          5.05  RUQ(cm)       RLQ(cm)        LUQ(cm)        LLQ(cm)  5.05          4.1            2.18           0 ---------------------------------------------------------------------- Biophysical Evaluation  Amniotic F.V:   Within normal limits        F. Tone:         Observed  F. Movement:    Not Observed                Score:  6/8  F. Breathing:   Observed ---------------------------------------------------------------------- OB History  Gravidity:     1 ---------------------------------------------------------------------- Gestational Age  LMP:            35w 0d       Date:  04/05/18                   EDD:  01/10/19  Best:           33w 5d    Det. ByMarcella Dubs           EDD:  01/19/19                                      (06/20/18) ---------------------------------------------------------------------- Anatomy  Thoracic:               Appears normal         Kidneys:                Appear normal  Stomach:                Appears normal, left   Bladder:                Appears normal                          sided  Abdomen:                Appears normal  ---------------------------------------------------------------------- Cervix Uterus Adnexa  Cervix  Not visualized (advanced GA >24wks) ---------------------------------------------------------------------- Impression  Biophysical profile (8/10)  BPP elevated 154/106 mmHg- repeat similar sent to MAU ---------------------------------------------------------------------- Recommendations  Follow up 1 week (previously scheduled) ----------------------------------------------------------------------               Lin Landsman, MD Electronically Signed Final Report   12/06/2018 03:27 pm ----------------------------------------------------------------------  [place] Mfm Fetal Bpp Wo Non Stress  Result Date: 11/29/2018 ----------------------------------------------------------------------  OBSTETRICS REPORT                       (Signed Final 11/29/2018 11:39 am) ---------------------------------------------------------------------- Patient Info  ID #:       [id number]                          D.O.B.:  [birth year] (32 yrs)  Name:       [name]                   Visit Date: 11/29/2018 10:32 am ---------------------------------------------------------------------- Performed By  Performed By:     Apolonio Schneiders Sweat         Ref. Address:     [address]  [place], [place]                                                             [address]  Attending:        Noralee Space MD        Location:         [facility]                                                             [facility]  Referred By:      Tereso Newcomer MD ---------------------------------------------------------------------- Orders   #  Description                          Code         Ordered By   1  [place] MFM FETAL BPP WO NON              76819.01     RAVI [facility]      STRESS   ----------------------------------------------------------------------   #  Order #                    Accession #                 Episode #   1  [id number]                  [id number]                  [id number]  ---------------------------------------------------------------------- Indications   Obesity complicating pregnancy, third          O99.212   trimester (Pre Pregnancy BMI 31.5)   Hypertension - Chronic/Pre-existing            O10.019   Pre-existing diabetes, type 2, in pregnancy,   O24.112   third trimester (Insulin/metformin) NML   ECHO   [redacted] weeks gestation of pregnancy                Z3A.32  ---------------------------------------------------------------------- Vital Signs                                                 Height:        5'7" ---------------------------------------------------------------------- Fetal Evaluation  Num Of Fetuses:         1  Fetal Heart Rate(bpm):  133  Cardiac Activity:       Observed  Presentation:           Cephalic  Placenta:               Right lateral  P. Cord Insertion:      Previously Visualized  Amniotic Fluid  AFI FV:      Within normal limits  AFI Sum(cm)     %Tile  Largest Pocket(cm)  18              66          6.01  RUQ(cm)       RLQ(cm)       LUQ(cm)        LLQ(cm)  1.66          4.76          5.57           6.01 ---------------------------------------------------------------------- Biophysical Evaluation  Amniotic F.V:   Within normal limits       F. Tone:        Observed  F. Movement:    Observed                   Score:          8/8  F. Breathing:   Observed ---------------------------------------------------------------------- OB History  Gravidity:    1 ---------------------------------------------------------------------- Gestational Age  LMP:           34w 0d        Date:  04/05/18                 EDD:   01/10/19  Best:          32w 5d     Det. ByMarcella Dubs         EDD:   01/19/19                                      (06/20/18)  ---------------------------------------------------------------------- Anatomy  Cranium:               Previously seen        Aortic Arch:            Previously seen  Cavum:                 Previously seen        Ductal Arch:            Previously seen  Ventricles:            Previously seen        Diaphragm:              Previously seen  Choroid Plexus:        Previously seen        Stomach:                Appears normal, left                                                                        sided  Cerebellum:            Previously seen        Abdomen:                Appears normal  Posterior Fossa:       Previously seen        Abdominal Wall:         Previously seen  Nuchal Fold:           Previously seen  Cord Vessels:           Previously seen  Face:                  Orbits appear          Kidneys:                Appear normal                         normal  Lips:                  Previously seen        Bladder:                Appears normal  Thoracic:              Previously seen        Spine:                  Previously seen  Heart:                 Previously seen        Upper Extremities:      Previously seen  RVOT:                  Previously seen        Lower Extremities:      Previously seen  LVOT:                  Previously seen  Other:  Heels and 5th digit previously seen. Nasal bone previously seen. ---------------------------------------------------------------------- Cervix Uterus Adnexa  Cervix  Not adaquately visualized  Uterus  No abnormality visualized.  Left Ovary  Not visualized.  Right Ovary  Not visualized.  Cul De Sac  No free fluid seen.  Adnexa  No abnormality visualized. ---------------------------------------------------------------------- Impression  Pregestational diabetes. Patient takes lantus insulin 60 units  at night and 50 units in the morning and metformin. She  increased her Humalog at dinner time to 15 units (Humalog  07/11/14). She reports her blood glucose levels  are now  within normal range.  Hypertension is well-controlled with labetalol.  BP at our office 149/99 mm Hg.  Amniotic fluid is normal and good fetal activity is seen.  Antenatal testing is reassuring. BPP 8/8. ---------------------------------------------------------------------- Recommendations  -Continue weekly BPP till delivery.  -Fetal growth assessment in 2 weeks. ----------------------------------------------------------------------                  Noralee Space, MD Electronically Signed Final Report   11/29/2018 11:39 am ----------------------------------------------------------------------  [place] Mfm Ob Follow Up  Result Date: 12/13/2018 ----------------------------------------------------------------------  OBSTETRICS REPORT                       (Signed Final 12/13/2018 11:58 am) ---------------------------------------------------------------------- Patient Info  ID #:       [id number]                          D.O.B.:  [birth year] (32 yrs)  Name:       [name]                   Visit Date: 12/13/2018 09:28 am ---------------------------------------------------------------------- Performed By  Performed By:     Apolonio Schneiders Sweat         Ref. Address:     [address]  [address]                                                             [place], [place]                                                             [address]  Attending:        Noralee Space MD        Location[facility]  Referred By:      Tereso Newcomer MD ---------------------------------------------------------------------- Orders   #  Description                          Code         Ordered By   1  [place] MFM OB FOLLOW UP                  60454.09     RAVI SHANKAR   2  [place] MFM FETAL BPP                     81191.4      RAVI [facility]      W/NONSTRESS   ----------------------------------------------------------------------   #  Order #                    Accession #                 Episode #   1  [id number]                  [id number]                  [id number]   2  [id number]                  [id number]                  [id number]  ---------------------------------------------------------------------- Indications   Obesity complicating pregnancy, third          I43.329   trimester (Pre Pregnancy BMI 31.5)   Hypertension - Chronic/Pre-existing            O10.019  Pre-existing diabetes, type 2, in pregnancy,   O24.112   third trimester (Insulin/metformin) NML   ECHO   [redacted] weeks gestation of pregnancy                Z3A.34  ---------------------------------------------------------------------- Vital Signs                                                 Height:        5'7" ---------------------------------------------------------------------- Fetal Evaluation  Num Of Fetuses:         1  Fetal Heart Rate(bpm):  130  Cardiac Activity:       Observed  Presentation:           Cephalic  Placenta:               Posterior Fundal  P. Cord Insertion:      Previously Visualized  Amniotic Fluid  AFI FV:      Within normal limits  AFI Sum(cm)     %Tile       Largest Pocket(cm)  15.97           58          5.43  RUQ(cm)       RLQ(cm)       LUQ(cm)        LLQ(cm)  3.31          5.43          3.57           3.66 ---------------------------------------------------------------------- Biophysical Evaluation  Amniotic F.V:   Within normal limits       F. Tone:        Observed  F. Movement:    Not Observed               N.S.T:          Reactive  F. Breathing:   Observed                   Score:          8/10 ---------------------------------------------------------------------- Biometry  BPD:      90.4  mm     G. Age:  36w 5d         93  %    CI:        78.47   %    70 - 86                                                          FL/HC:      20.0   %    20.1 - 22.3  HC:      322.8  mm      G. Age:  36w 3d         60  %    HC/AC:      0.97        0.93 - 1.11  AC:       333   mm     G. Age:  37w 1d       > 97  %    FL/BPD:     71.3   %  71 - 87  FL:       64.5  mm     G. Age:  33w 2d         11  %    FL/AC:      19.4   %    20 - 24  HUM:      60.9  mm     G. Age:  35w 2d         75  %  Est. FW:    2851  gm      6 lb 5 oz     83  % ---------------------------------------------------------------------- OB History  Gravidity:    1 ---------------------------------------------------------------------- Gestational Age  LMP:           36w 0d        Date:  04/05/18                 EDD:   01/10/19  U/S Today:     35w 6d                                        EDD:   01/11/19  Best:          34w 5d     Det. By:  Marcella Dubs         EDD:   01/19/19                                      (06/20/18) ---------------------------------------------------------------------- Anatomy  Thoracic:              Appears normal         Kidneys:                Not well visualized  Stomach:               Appears normal, left   Bladder:                Appears normal                         sided  Abdomen:               Appears normal ---------------------------------------------------------------------- Cervix Uterus Adnexa  Cervix  Not adaquately visualized  Uterus  No abnormality visualized.  Left Ovary  Not visualized.  Right Ovary  Not visualized.  Cul De Sac  No free fluid seen.  Adnexa  No abnormality visualized. ---------------------------------------------------------------------- Impression  Chronic hypertension: Labetalol dosage was increased to  400 mg tid yesterday and the patient will be picking up her  prescription to start today.  Diabetes: Patient takes lantus insulin 55 units in the morning  and 65 units in the evening. She takes Humalog 07/11/14  units with meals. She also takes metformin 500 mg three  times daily with meals. She reports her blood glucose levels  are within normal range except occasional  hyperglycemia  (post-dinner).  Blood pressures at our office today were 166/101 and 156/95  mm Hg.  She does not have symptoms of severe features of  preeclampsia.  On ultrasound, amniotic fluid is normal. Fetal growth is  appropriate for gestational age. Gross body movements did  not meet the criteria of BPP. NST is reactive. BPP 8/10.  I reassured the patient of the findings.  I encouraged her to take labetalol regularly.  Patient has co-morbid conditions of diabetes and  hypertension. Her blood pressures are not well controlled  now and requires increasing dosage of labetalol.  I recommend delivery at 37 weeks to prevent adverse  maternal and fetal outcomes. ---------------------------------------------------------------------- Recommendations  -Continue weekly BPP and NST till delivery.  -Delivery at 37 weeks. ----------------------------------------------------------------------                  Noralee Space, MD Electronically Signed Final Report   12/13/2018 11:58 am ----------------------------------------------------------------------    MAU Course  Procedures  MDM -pt monitored for near-severe range pressures -EFM baseline 140, mod variability, pos accels, neg decels. TOCO few ctx, pt not feeling ctx. -no preeclampsia labs done today d/t asymptomatic pt, last done on 12/16/2018 with PRO/creatinine ration of 6.59 -Spoke with Dr. Earlene Plater, pt cleared for discharge, to discuss med changes in office tomorrow -pt discharged to home  Orders Placed This Encounter  Procedures  . Urinalysis, Routine w reflex microscopic    Standing Status:   Standing    Number of Occurrences:   1  . Protein, urine, 24 hour    Standing Status:   Standing    Number of Occurrences:   1  . Creatinine, urine, 24 hour    Standing Status:   Standing    Number of Occurrences:   1   No orders of the defined types were placed in this encounter.   Assessment and Plan   1. Chronic hypertension with superimposed  preeclampsia   2. Supervision of high risk pregnancy, antepartum, first trimester   3. Chronic hypertension affecting pregnancy   4. Proteinuria affecting pregnancy in third trimester    -pt strongly advised to keep appt in office tomorrow -strict preeclampsia/decreased FM/labor/return MAU precautions discussed -per Dr. Earlene Plater, meds to be adjusted by Dr. Adrian Blackwater in clinic tomorrow -pt discharged to home in stable condition  Odie Sera Branae Crail 12/18/2018, 1:21 PM

## 2018-12-18 NOTE — Discharge Instructions (Signed)
.  Preeclampsia and Eclampsia    Preeclampsia is a serious condition that may develop during pregnancy. It is also called toxemia of pregnancy. This condition causes high blood pressure along with other symptoms, such as swelling and headaches. These symptoms may develop as the condition gets worse. Preeclampsia may occur at 20 weeks of pregnancy or later.  Diagnosing and treating preeclampsia early is very important. If not treated early, it can cause serious problems for you and your baby. One problem it can lead to is eclampsia. Eclampsia is a condition that causes muscle jerking or shaking (convulsions or seizures) and other serious problems for the mother. During pregnancy, delivering your baby may be the best treatment for preeclampsia or eclampsia. For most women, preeclampsia and eclampsia symptoms go away after giving birth.  In rare cases, a woman may develop preeclampsia after giving birth (postpartum preeclampsia). This usually occurs within 48 hours after childbirth but may occur up to 6 weeks after giving birth.  What are the causes?  The cause of preeclampsia is not known.  What increases the risk?  The following risk factors make you more likely to develop preeclampsia:   Being pregnant for the first time.   Having had preeclampsia during a past pregnancy.   Having a family history of preeclampsia.   Having high blood pressure.   Being pregnant with more than one baby.   Being 35 or older.   Being African-American.   Having kidney disease or diabetes.   Having medical conditions such as lupus or blood diseases.   Being very overweight (obese).  What are the signs or symptoms?  The earliest signs of preeclampsia are:   High blood pressure.   Increased protein in your urine. Your health care provider will check for this at every visit before you give birth (prenatal visit).  Other symptoms that may develop as the condition gets worse include:   Severe headaches.   Sudden weight  gain.   Swelling of the hands, face, legs, and feet.   Nausea and vomiting.   Vision problems, such as blurred or double vision.   Numbness in the face, arms, legs, and feet.   Urinating less than usual.   Dizziness.   Slurred speech.   Abdominal pain, especially upper abdominal pain.   Convulsions or seizures.  How is this diagnosed?  There are no screening tests for preeclampsia. Your health care provider will ask you about symptoms and check for signs of preeclampsia during your prenatal visits. You may also have tests that include:   Urine tests.   Blood tests.   Checking your blood pressure.   Monitoring your baby's heart rate.   Ultrasound.  How is this treated?  You and your health care provider will determine the treatment approach that is best for you. Treatment may include:   Having more frequent prenatal exams to check for signs of preeclampsia, if you have an increased risk for preeclampsia.   Medicine to lower your blood pressure.   Staying in the hospital, if your condition is severe. There, treatment will focus on controlling your blood pressure and the amount of fluids in your body (fluid retention).   Taking medicine (magnesium sulfate) to prevent seizures. This may be given as an injection or through an IV.   Taking a low-dose aspirin during your pregnancy.   Delivering your baby early, if your condition gets worse. You may have your labor started with medicine (induced), or you may have a cesarean   delivery.  Follow these instructions at home:  Eating and drinking     Drink enough fluid to keep your urine pale yellow.   Avoid caffeine.  Lifestyle   Do not use any products that contain nicotine or tobacco, such as cigarettes and e-cigarettes. If you need help quitting, ask your health care provider.   Do not use alcohol or drugs.   Avoid stress as much as possible. Rest and get plenty of sleep.  General instructions   Take over-the-counter and prescription medicines only as  told by your health care provider.   When lying down, lie on your left side. This keeps pressure off your major blood vessels.   When sitting or lying down, raise (elevate) your feet. Try putting some pillows underneath your lower legs.   Exercise regularly. Ask your health care provider what kinds of exercise are best for you.   Keep all follow-up and prenatal visits as told by your health care provider. This is important.  How is this prevented?  There is no known way of preventing preeclampsia or eclampsia from developing. However, to lower your risk of complications and detect problems early:   Get regular prenatal care. Your health care provider may be able to diagnose and treat the condition early.   Maintain a healthy weight. Ask your health care provider for help managing weight gain during pregnancy.   Work with your health care provider to manage any long-term (chronic) health conditions you have, such as diabetes or kidney problems.   You may have tests of your blood pressure and kidney function after giving birth.   Your health care provider may have you take low-dose aspirin during your next pregnancy.  Contact a health care provider if:   You have symptoms that your health care provider told you may require more treatment or monitoring, such as:  ? Headaches.  ? Nausea or vomiting.  ? Abdominal pain.  ? Dizziness.  ? Light-headedness.  Get help right away if:   You have severe:  ? Abdominal pain.  ? Headaches that do not get better.  ? Dizziness.  ? Vision problems.  ? Confusion.  ? Nausea or vomiting.   You have any of the following:  ? A seizure.  ? Sudden, rapid weight gain.  ? Sudden swelling in your hands, ankles, or face.  ? Trouble moving any part of your body.  ? Numbness in any part of your body.  ? Trouble speaking.  ? Abnormal bleeding.   You faint.  Summary   Preeclampsia is a serious condition that may develop during pregnancy. It is also called toxemia of pregnancy.   This  condition causes high blood pressure along with other symptoms, such as swelling and headaches.   Diagnosing and treating preeclampsia early is very important. If not treated early, it can cause serious problems for you and your baby.   Get help right away if you have symptoms that your health care provider told you to watch for.  This information is not intended to replace advice given to you by your health care provider. Make sure you discuss any questions you have with your health care provider.  Document Released: 09/15/2000 Document Revised: 09/04/2017 Document Reviewed: 04/24/2016  Elsevier Interactive Patient Education  2019 [employer]

## 2018-12-19 ENCOUNTER — Ambulatory Visit (INDEPENDENT_AMBULATORY_CARE_PROVIDER_SITE_OTHER): Payer: BLUE CROSS/BLUE SHIELD | Admitting: Family Medicine

## 2018-12-19 ENCOUNTER — Other Ambulatory Visit (HOSPITAL_COMMUNITY)
Admission: RE | Admit: 2018-12-19 | Discharge: 2018-12-19 | Disposition: A | Discharge status: Home / Self Care | Payer: BLUE CROSS/BLUE SHIELD | Source: Ambulatory Visit | Attending: Family Medicine | Admitting: Family Medicine

## 2018-12-19 VITALS — BP 151/97 | HR 99 | Wt 244.1 lb

## 2018-12-19 DIAGNOSIS — O24119 Pre-existing diabetes mellitus, type 2, in pregnancy, unspecified trimester: Secondary | ICD-10-CM

## 2018-12-19 DIAGNOSIS — O119 Pre-existing hypertension with pre-eclampsia, unspecified trimester: Secondary | ICD-10-CM

## 2018-12-19 DIAGNOSIS — O24113 Pre-existing diabetes mellitus, type 2, in pregnancy, third trimester: Secondary | ICD-10-CM

## 2018-12-19 DIAGNOSIS — O0990 Supervision of high risk pregnancy, unspecified, unspecified trimester: Secondary | ICD-10-CM | POA: Insufficient documentation

## 2018-12-19 DIAGNOSIS — O113 Pre-existing hypertension with pre-eclampsia, third trimester: Secondary | ICD-10-CM

## 2018-12-19 DIAGNOSIS — Z3A35 35 weeks gestation of pregnancy: Secondary | ICD-10-CM

## 2018-12-19 LAB — POCT URINALYSIS DIPSTICK OB
Blood, UA: NEGATIVE
Leukocytes, UA: NEGATIVE
Nitrite, UA: NEGATIVE
Spec Grav, UA: 1.01 (ref 1.010–1.025)
pH, UA: 6 (ref 5.0–8.0)

## 2018-12-19 LAB — OB RESULTS CONSOLE GBS: GBS: POSITIVE

## 2018-12-19 LAB — OB RESULTS CONSOLE GC/CHLAMYDIA: Gonorrhea: NEGATIVE

## 2018-12-19 MED ORDER — NIFEDIPINE ER OSMOTIC RELEASE 30 MG PO TB24: 30.0000 mg | ORAL_TABLET | Freq: Two times a day (BID) | ORAL | 2 refills | Status: DC

## 2018-12-19 NOTE — Addendum Note (Signed)
Addended by: Levie Heritage on: 12/19/2018 05:15 PM   Modules accepted: Orders, SmartSet

## 2018-12-19 NOTE — Progress Notes (Signed)
Patient scheduled for induction on 12-29-18 at 8am per Dr. Adrian Blackwater. [name] RN

## 2018-12-19 NOTE — Progress Notes (Signed)
Subjective:  [name] is a 33 y.o. G1P0000 at [redacted]w[redacted]d being seen today for ongoing prenatal care.  She is currently monitored for the following issues for this high-risk pregnancy and has BMI 33.0-33.9,adult; Chronic hypertension affecting pregnancy; Type 2 diabetes mellitus during pregnancy; Supervision of high risk pregnancy, antepartum, first trimester; Proteinuria affecting pregnancy; Pap smear abnormality of cervix/human papillomavirus (HPV) positive; and Chronic hypertension with superimposed preeclampsia on their problem list.  GDM: Patient taking insulin and metformin.  Reports no hypoglycemic episodes.  Tolerating medication well. Did not bring log. Reports blood sugars as controlled  Patient reports no complaints.  Contractions: Irregular. Vag. Bleeding: None.  Movement: Present. Denies leaking of fluid.   The following portions of the patient's history were reviewed and updated as appropriate: allergies, current medications, past family history, past medical history, past social history, past surgical history and problem list. Problem list updated.  Objective:   Vitals:   12/19/18 1529 12/19/18 1532  BP: (!) 142/87 (!) 151/97  Pulse: 98 99  Weight: 244 lb 1.3 oz (110.7 kg)     Fetal Status: Fetal Heart Rate (bpm): 142 Fundal Height: 39 cm Movement: Present  Presentation: Vertex  General:  Alert, oriented and cooperative. Patient is in no acute distress.  Skin: Skin is warm and dry. No rash noted.   Cardiovascular: Normal heart rate noted  Respiratory: Normal respiratory effort, no problems with respiration noted  Abdomen: Soft, gravid, appropriate for gestational age. Pain/Pressure: Present     Pelvic: Vag. Bleeding: None     Cervical exam performed Dilation: Fingertip Effacement (%): Thick    Extremities: Normal range of motion.  Edema: Trace  Mental Status: Normal mood and affect. Normal behavior. Normal judgment and thought content.   Urinalysis:      Assessment and  Plan:  Pregnancy: G1P0000 at [redacted]w[redacted]d  1. Supervision of high risk pregnancy, antepartum FHT and Fh normal - POC Urinalysis Dipstick OB - GC/Chlamydia probe amp ([place])not at [facility] - Culture, beta strep (group b only)  2. Chronic hypertension with superimposed preeclampsia Add procardia xl 30mg  bid. Induce at 37 weeks. BPP tomorrow.   3. Type 2 diabetes mellitus during pregnancy, antepartum Controlled per patient. encouranged pt to bring in log.  Preterm labor symptoms and general obstetric precautions including but not limited to vaginal bleeding, contractions, leaking of fluid and fetal movement were reviewed in detail with the patient. Please refer to After Visit Summary for other counseling recommendations.  No follow-ups on file.   Levie Heritage, DO

## 2018-12-20 ENCOUNTER — Ambulatory Visit (HOSPITAL_COMMUNITY)
Admission: RE | Admit: 2018-12-20 | Discharge: 2018-12-20 | Disposition: A | Discharge status: Home / Self Care | Payer: BLUE CROSS/BLUE SHIELD | Source: Ambulatory Visit | Attending: Obstetrics and Gynecology | Admitting: Obstetrics and Gynecology

## 2018-12-20 ENCOUNTER — Ambulatory Visit (HOSPITAL_COMMUNITY): Payer: BLUE CROSS/BLUE SHIELD | Admitting: *Deleted

## 2018-12-20 ENCOUNTER — Other Ambulatory Visit: Payer: Self-pay

## 2018-12-20 ENCOUNTER — Telehealth (HOSPITAL_COMMUNITY): Payer: Self-pay | Admitting: *Deleted

## 2018-12-20 ENCOUNTER — Encounter (HOSPITAL_COMMUNITY): Payer: Self-pay

## 2018-12-20 VITALS — BP 172/102 | HR 99 | Temp 98.6°F | Wt 245.5 lb

## 2018-12-20 DIAGNOSIS — O24113 Pre-existing diabetes mellitus, type 2, in pregnancy, third trimester: Secondary | ICD-10-CM | POA: Diagnosis not present

## 2018-12-20 DIAGNOSIS — O119 Pre-existing hypertension with pre-eclampsia, unspecified trimester: Secondary | ICD-10-CM

## 2018-12-20 DIAGNOSIS — O99213 Obesity complicating pregnancy, third trimester: Secondary | ICD-10-CM

## 2018-12-20 DIAGNOSIS — O10013 Pre-existing essential hypertension complicating pregnancy, third trimester: Secondary | ICD-10-CM

## 2018-12-20 DIAGNOSIS — Z3A35 35 weeks gestation of pregnancy: Secondary | ICD-10-CM

## 2018-12-20 DIAGNOSIS — O10919 Unspecified pre-existing hypertension complicating pregnancy, unspecified trimester: Secondary | ICD-10-CM

## 2018-12-20 DIAGNOSIS — O0991 Supervision of high risk pregnancy, unspecified, first trimester: Secondary | ICD-10-CM | POA: Insufficient documentation

## 2018-12-20 NOTE — Procedures (Signed)
[name] June 15, 1986 [redacted]w[redacted]d  Fetus A Non-Stress Test Interpretation for 12/20/18  Indication: Chronic Hypertenstion  Fetal Heart Rate A Mode: External Baseline Rate (A): 135 bpm Variability: Moderate Accelerations: 15 x 15 Decelerations: None Multiple birth?: No  Uterine Activity Mode: Toco Contraction Frequency (min): occ UC noted Contraction Duration (sec): 40-60 Contraction Quality: Mild Resting Tone Palpated: Relaxed Resting Time: Adequate  Interpretation (Fetal Testing) Nonstress Test Interpretation: Reactive Comments: FHR tracing rev'd by Dr. Judeth Cornfield

## 2018-12-20 NOTE — Telephone Encounter (Signed)
Preadmission screen  

## 2018-12-20 NOTE — Progress Notes (Signed)
Pt here today in [facility] for U/S BPP.  Pt states MD called procardia 30 mg bid into pharmacy but she hasn't picked it up yet.  Pt states she will pick the procardia and start taking after appt today.  Dr. Judeth Cornfield made aware.

## 2018-12-22 ENCOUNTER — Other Ambulatory Visit: Payer: Self-pay

## 2018-12-22 ENCOUNTER — Inpatient Hospital Stay (HOSPITAL_COMMUNITY)
Admission: AD | Admit: 2018-12-22 | Discharge: 2018-12-28 | DRG: 788 | Disposition: A | Discharge status: Home / Self Care | Payer: BLUE CROSS/BLUE SHIELD | Attending: Family Medicine | Admitting: Family Medicine

## 2018-12-22 ENCOUNTER — Encounter (HOSPITAL_COMMUNITY): Payer: Self-pay

## 2018-12-22 DIAGNOSIS — Z87891 Personal history of nicotine dependence: Secondary | ICD-10-CM

## 2018-12-22 DIAGNOSIS — Z3A00 Weeks of gestation of pregnancy not specified: Secondary | ICD-10-CM | POA: Diagnosis not present

## 2018-12-22 DIAGNOSIS — Z3A36 36 weeks gestation of pregnancy: Secondary | ICD-10-CM | POA: Diagnosis not present

## 2018-12-22 DIAGNOSIS — O1410 Severe pre-eclampsia, unspecified trimester: Secondary | ICD-10-CM | POA: Diagnosis present

## 2018-12-22 DIAGNOSIS — Z3490 Encounter for supervision of normal pregnancy, unspecified, unspecified trimester: Secondary | ICD-10-CM | POA: Diagnosis present

## 2018-12-22 DIAGNOSIS — O99824 Streptococcus B carrier state complicating childbirth: Secondary | ICD-10-CM | POA: Diagnosis not present

## 2018-12-22 DIAGNOSIS — Z794 Long term (current) use of insulin: Secondary | ICD-10-CM

## 2018-12-22 DIAGNOSIS — O119 Pre-existing hypertension with pre-eclampsia, unspecified trimester: Secondary | ICD-10-CM | POA: Diagnosis present

## 2018-12-22 DIAGNOSIS — O1002 Pre-existing essential hypertension complicating childbirth: Secondary | ICD-10-CM | POA: Diagnosis not present

## 2018-12-22 DIAGNOSIS — O0991 Supervision of high risk pregnancy, unspecified, first trimester: Secondary | ICD-10-CM

## 2018-12-22 DIAGNOSIS — E669 Obesity, unspecified: Secondary | ICD-10-CM | POA: Diagnosis present

## 2018-12-22 DIAGNOSIS — O10919 Unspecified pre-existing hypertension complicating pregnancy, unspecified trimester: Secondary | ICD-10-CM | POA: Diagnosis present

## 2018-12-22 DIAGNOSIS — O1210 Gestational proteinuria, unspecified trimester: Secondary | ICD-10-CM | POA: Diagnosis present

## 2018-12-22 DIAGNOSIS — O2412 Pre-existing diabetes mellitus, type 2, in childbirth: Secondary | ICD-10-CM | POA: Diagnosis present

## 2018-12-22 DIAGNOSIS — O10013 Pre-existing essential hypertension complicating pregnancy, third trimester: Secondary | ICD-10-CM | POA: Diagnosis not present

## 2018-12-22 DIAGNOSIS — O24119 Pre-existing diabetes mellitus, type 2, in pregnancy, unspecified trimester: Secondary | ICD-10-CM | POA: Diagnosis present

## 2018-12-22 DIAGNOSIS — O99214 Obesity complicating childbirth: Secondary | ICD-10-CM | POA: Diagnosis present

## 2018-12-22 DIAGNOSIS — O324XX0 Maternal care for high head at term, not applicable or unspecified: Secondary | ICD-10-CM | POA: Diagnosis not present

## 2018-12-22 DIAGNOSIS — O113 Pre-existing hypertension with pre-eclampsia, third trimester: Secondary | ICD-10-CM | POA: Diagnosis not present

## 2018-12-22 DIAGNOSIS — O139 Gestational [pregnancy-induced] hypertension without significant proteinuria, unspecified trimester: Secondary | ICD-10-CM | POA: Diagnosis not present

## 2018-12-22 DIAGNOSIS — O114 Pre-existing hypertension with pre-eclampsia, complicating childbirth: Secondary | ICD-10-CM | POA: Diagnosis not present

## 2018-12-22 DIAGNOSIS — E119 Type 2 diabetes mellitus without complications: Secondary | ICD-10-CM | POA: Diagnosis not present

## 2018-12-22 DIAGNOSIS — Z7689 Persons encountering health services in other specified circumstances: Secondary | ICD-10-CM | POA: Diagnosis present

## 2018-12-22 HISTORY — DX: Gestational diabetes mellitus in pregnancy, unspecified control: O24.419

## 2018-12-22 LAB — URINALYSIS, ROUTINE W REFLEX MICROSCOPIC
Bilirubin Urine: NEGATIVE
GLUCOSE, UA: NEGATIVE mg/dL
Hgb urine dipstick: NEGATIVE
Ketones, ur: NEGATIVE mg/dL
LEUKOCYTE UA: NEGATIVE
Nitrite: NEGATIVE
Protein, ur: 100 mg/dL — AB
Specific Gravity, Urine: 1.008 (ref 1.005–1.030)
pH: 7 (ref 5.0–8.0)

## 2018-12-22 LAB — COMPREHENSIVE METABOLIC PANEL
ALT: 15 U/L (ref 0–44)
AST: 18 U/L (ref 15–41)
Albumin: 2 g/dL — ABNORMAL LOW (ref 3.5–5.0)
Alkaline Phosphatase: 126 U/L (ref 38–126)
Anion gap: 8 (ref 5–15)
BUN: 7 mg/dL (ref 6–20)
CO2: 21 mmol/L — ABNORMAL LOW (ref 22–32)
Calcium: 8.7 mg/dL — ABNORMAL LOW (ref 8.9–10.3)
Chloride: 106 mmol/L (ref 98–111)
Creatinine, Ser: 0.62 mg/dL (ref 0.44–1.00)
GFR calc Af Amer: >60 mL/min (ref >60)
GFR calc non Af Amer: >60 mL/min (ref >60)
GLUCOSE: 152 mg/dL — AB (ref 70–99)
Potassium: 3.9 mmol/L (ref 3.5–5.1)
Sodium: 135 mmol/L (ref 135–145)
Total Bilirubin: 0.7 mg/dL (ref 0.3–1.2)
Total Protein: 6.4 g/dL — ABNORMAL LOW (ref 6.5–8.1)

## 2018-12-22 LAB — CBC
HCT: 35.5 % — ABNORMAL LOW (ref 36.0–46.0)
Hemoglobin: 12.6 g/dL (ref 12.0–15.0)
MCH: 30.1 pg (ref 26.0–34.0)
MCHC: 35.5 g/dL (ref 30.0–36.0)
MCV: 84.9 fL (ref 80.0–100.0)
Platelets: 358 10*3/uL (ref 150–400)
RBC: 4.18 MIL/uL (ref 3.87–5.11)
RDW: 13.2 % (ref 11.5–15.5)
WBC: 8 10*3/uL (ref 4.0–10.5)
nRBC: 0 % (ref 0.0–0.2)

## 2018-12-22 LAB — GLUCOSE, CAPILLARY
GLUCOSE-CAPILLARY: 239 mg/dL — AB (ref 70–99)
GLUCOSE-CAPILLARY: 258 mg/dL — AB (ref 70–99)
GLUCOSE-CAPILLARY: 260 mg/dL — AB (ref 70–99)
GLUCOSE-CAPILLARY: 162 mg/dL — AB (ref 70–99)
Glucose-Capillary: 119 mg/dL — ABNORMAL HIGH (ref 70–99)
Glucose-Capillary: 138 mg/dL — ABNORMAL HIGH (ref 70–99)
Glucose-Capillary: 158 mg/dL — ABNORMAL HIGH (ref 70–99)
Glucose-Capillary: 178 mg/dL — ABNORMAL HIGH (ref 70–99)
Glucose-Capillary: 201 mg/dL — ABNORMAL HIGH (ref 70–99)
Glucose-Capillary: 234 mg/dL — ABNORMAL HIGH (ref 70–99)
Glucose-Capillary: 248 mg/dL — ABNORMAL HIGH (ref 70–99)
Glucose-Capillary: 274 mg/dL — ABNORMAL HIGH (ref 70–99)

## 2018-12-22 LAB — CULTURE, BETA STREP (GROUP B ONLY): Strep Gp B Culture: POSITIVE — AB

## 2018-12-22 LAB — HEMOGLOBIN A1C
Hgb A1c MFr Bld: 7.3 % — ABNORMAL HIGH (ref 4.8–5.6)
Mean Plasma Glucose: 162.81 mg/dL

## 2018-12-22 LAB — PROTEIN / CREATININE RATIO, URINE
Creatinine, Urine: 28.9 mg/dL
Protein Creatinine Ratio: 11.76 mg/mg{Cre} — ABNORMAL HIGH (ref 0.00–0.15)
Total Protein, Urine: 340 mg/dL

## 2018-12-22 LAB — ABO/RH: ABO/RH(D): AB POS

## 2018-12-22 LAB — RPR: RPR Ser Ql: NONREACTIVE

## 2018-12-22 LAB — TYPE AND SCREEN
ABO/RH(D): AB POS
Antibody Screen: NEGATIVE

## 2018-12-22 MED ORDER — LIDOCAINE HCL (PF) 1 % IJ SOLN: 30.0000 mL | INTRAMUSCULAR | Status: DC | PRN

## 2018-12-22 MED ORDER — OXYCODONE-ACETAMINOPHEN 5-325 MG PO TABS: 2.0000 | ORAL_TABLET | ORAL | Status: DC | PRN

## 2018-12-22 MED ORDER — ACETAMINOPHEN 325 MG PO TABS: 650.0000 mg | ORAL_TABLET | ORAL | Status: DC | PRN

## 2018-12-22 MED ORDER — SODIUM CHLORIDE 0.9 % IV SOLN: 5.0000 10*6.[IU] | Freq: Once | INTRAVENOUS | Status: DC

## 2018-12-22 MED ORDER — OXYCODONE HCL 5 MG PO TABS
10.0000 mg | ORAL_TABLET | Freq: Once | ORAL | Status: AC
  Administered 2018-12-22: 10 mg via ORAL
  Filled 2018-12-22: qty 2

## 2018-12-22 MED ORDER — ONDANSETRON HCL 4 MG/2ML IJ SOLN
4.0000 mg | Freq: Once | INTRAMUSCULAR | Status: AC
  Administered 2018-12-22: 4 mg via INTRAVENOUS

## 2018-12-22 MED ORDER — TERBUTALINE SULFATE 1 MG/ML IJ SOLN: 0.2500 mg | Freq: Once | INTRAMUSCULAR | Status: DC | PRN

## 2018-12-22 MED ORDER — SOD CITRATE-CITRIC ACID 500-334 MG/5ML PO SOLN
30.0000 mL | ORAL | Status: DC | PRN
  Administered 2018-12-23: 15 mL via ORAL
  Administered 2018-12-25: 30 mL via ORAL
  Filled 2018-12-22 (×4): qty 15

## 2018-12-22 MED ORDER — LABETALOL HCL 5 MG/ML IV SOLN
80.0000 mg | INTRAVENOUS | Status: DC | PRN
  Administered 2018-12-22: 80 mg via INTRAVENOUS
  Filled 2018-12-22: qty 16

## 2018-12-22 MED ORDER — INSULIN GLARGINE 100 UNIT/ML ~~LOC~~ SOLN
20.0000 [IU] | Freq: Two times a day (BID) | SUBCUTANEOUS | Status: DC
  Filled 2018-12-22 (×2): qty 0.2

## 2018-12-22 MED ORDER — LABETALOL HCL 5 MG/ML IV SOLN
40.0000 mg | INTRAVENOUS | Status: DC | PRN
  Administered 2018-12-22: 40 mg via INTRAVENOUS
  Filled 2018-12-22: qty 8

## 2018-12-22 MED ORDER — LABETALOL HCL 200 MG PO TABS
600.0000 mg | ORAL_TABLET | Freq: Three times a day (TID) | ORAL | Status: DC
  Administered 2018-12-22 – 2018-12-24 (×9): 600 mg via ORAL
  Filled 2018-12-22 (×9): qty 3

## 2018-12-22 MED ORDER — HYDRALAZINE HCL 20 MG/ML IJ SOLN
5.0000 mg | INTRAMUSCULAR | Status: DC | PRN
  Filled 2018-12-22: qty 1

## 2018-12-22 MED ORDER — LABETALOL HCL 5 MG/ML IV SOLN
20.0000 mg | INTRAVENOUS | Status: DC | PRN
  Administered 2018-12-22: 20 mg via INTRAVENOUS
  Filled 2018-12-22: qty 4

## 2018-12-22 MED ORDER — HYDRALAZINE HCL 20 MG/ML IJ SOLN: 10.0000 mg | INTRAMUSCULAR | Status: DC | PRN

## 2018-12-22 MED ORDER — OXYCODONE-ACETAMINOPHEN 5-325 MG PO TABS
2.0000 | ORAL_TABLET | Freq: Once | ORAL | Status: DC
  Filled 2018-12-22: qty 2

## 2018-12-22 MED ORDER — MISOPROSTOL 25 MCG QUARTER TABLET
25.0000 ug | ORAL_TABLET | ORAL | Status: DC | PRN
  Administered 2018-12-22 (×4): 25 ug via VAGINAL
  Filled 2018-12-22 (×4): qty 1

## 2018-12-22 MED ORDER — LABETALOL HCL 5 MG/ML IV SOLN: 20.0000 mg | INTRAVENOUS | Status: DC | PRN

## 2018-12-22 MED ORDER — NIFEDIPINE ER OSMOTIC RELEASE 30 MG PO TB24: 30.0000 mg | ORAL_TABLET | Freq: Two times a day (BID) | ORAL | Status: DC

## 2018-12-22 MED ORDER — INSULIN GLARGINE 100 UNIT/ML SOLOSTAR PEN: 20.0000 [IU] | PEN_INJECTOR | Freq: Two times a day (BID) | SUBCUTANEOUS | Status: DC

## 2018-12-22 MED ORDER — PENICILLIN G 3 MILLION UNITS IVPB - SIMPLE MED
3.0000 10*6.[IU] | INTRAVENOUS | Status: DC
  Filled 2018-12-22 (×3): qty 100

## 2018-12-22 MED ORDER — LABETALOL HCL 5 MG/ML IV SOLN: 80.0000 mg | INTRAVENOUS | Status: DC | PRN

## 2018-12-22 MED ORDER — OXYCODONE-ACETAMINOPHEN 5-325 MG PO TABS: 1.0000 | ORAL_TABLET | ORAL | Status: DC | PRN

## 2018-12-22 MED ORDER — INSULIN GLARGINE 100 UNIT/ML ~~LOC~~ SOLN
20.0000 [IU] | Freq: Two times a day (BID) | SUBCUTANEOUS | Status: DC
  Administered 2018-12-22: 20 [IU] via SUBCUTANEOUS
  Filled 2018-12-22 (×2): qty 0.2

## 2018-12-22 MED ORDER — INSULIN REGULAR(HUMAN) IN NACL 100-0.9 UT/100ML-% IV SOLN
INTRAVENOUS | Status: DC
  Administered 2018-12-22: 2 [IU]/h via INTRAVENOUS
  Administered 2018-12-23: 4.8 [IU]/h via INTRAVENOUS
  Administered 2018-12-24: 1.3 [IU]/h via INTRAVENOUS
  Filled 2018-12-22 (×5): qty 100

## 2018-12-22 MED ORDER — PENICILLIN G 3 MILLION UNITS IVPB - SIMPLE MED
3.0000 10*6.[IU] | INTRAVENOUS | Status: DC
  Administered 2018-12-22 – 2018-12-25 (×16): 3 10*6.[IU] via INTRAVENOUS
  Filled 2018-12-22 (×17): qty 100

## 2018-12-22 MED ORDER — MISOPROSTOL 50MCG HALF TABLET
50.0000 ug | ORAL_TABLET | ORAL | Status: DC | PRN
  Administered 2018-12-22 – 2018-12-23 (×3): 50 ug via ORAL
  Filled 2018-12-22 (×3): qty 1

## 2018-12-22 MED ORDER — MAGNESIUM SULFATE BOLUS VIA INFUSION
4.0000 g | Freq: Once | INTRAVENOUS | Status: AC
  Administered 2018-12-22: 4 g via INTRAVENOUS
  Filled 2018-12-22: qty 500

## 2018-12-22 MED ORDER — ONDANSETRON HCL 4 MG/2ML IJ SOLN
4.0000 mg | Freq: Four times a day (QID) | INTRAMUSCULAR | Status: DC | PRN
  Administered 2018-12-22 – 2018-12-25 (×4): 4 mg via INTRAVENOUS
  Filled 2018-12-22 (×5): qty 2

## 2018-12-22 MED ORDER — LACTATED RINGERS IV SOLN
INTRAVENOUS | Status: DC
  Administered 2018-12-22 – 2018-12-25 (×4): via INTRAVENOUS

## 2018-12-22 MED ORDER — BETAMETHASONE SOD PHOS & ACET 6 (3-3) MG/ML IJ SUSP
12.0000 mg | INTRAMUSCULAR | Status: AC
  Administered 2018-12-22 – 2018-12-23 (×2): 12 mg via INTRAMUSCULAR
  Filled 2018-12-22 (×2): qty 2

## 2018-12-22 MED ORDER — MAGNESIUM SULFATE 40 G IN LACTATED RINGERS - SIMPLE
2.0000 g/h | INTRAVENOUS | Status: DC
  Administered 2018-12-22 – 2018-12-25 (×5): 2 g/h via INTRAVENOUS
  Filled 2018-12-22 (×5): qty 500

## 2018-12-22 MED ORDER — LABETALOL HCL 5 MG/ML IV SOLN: 40.0000 mg | INTRAVENOUS | Status: DC | PRN

## 2018-12-22 MED ORDER — OXYTOCIN 40 UNITS IN NORMAL SALINE INFUSION - SIMPLE MED: 2.5000 [IU]/h | INTRAVENOUS | Status: DC

## 2018-12-22 MED ORDER — DEXTROSE IN LACTATED RINGERS 5 % IV SOLN
INTRAVENOUS | Status: DC
  Administered 2018-12-22 – 2018-12-24 (×6): via INTRAVENOUS

## 2018-12-22 MED ORDER — LACTATED RINGERS IV SOLN: 500.0000 mL | INTRAVENOUS | Status: DC | PRN

## 2018-12-22 MED ORDER — SODIUM CHLORIDE 0.9 % IV SOLN
5.0000 10*6.[IU] | Freq: Once | INTRAVENOUS | Status: AC
  Administered 2018-12-22: 5 10*6.[IU] via INTRAVENOUS
  Filled 2018-12-22: qty 5

## 2018-12-22 MED ORDER — OXYTOCIN BOLUS FROM INFUSION: 500.0000 mL | Freq: Once | INTRAVENOUS | Status: DC

## 2018-12-22 NOTE — MAU Provider Note (Signed)
History     CSN: [id number]  Arrival date and time: 12/22/18 0001   First Provider Initiated Contact with Patient 12/22/18 0040      Chief Complaint  Patient presents with  . Headache   HPI [name] is a 33 y.o. G1P0000 at [redacted]w[redacted]d who presents to MAU with chief complaint of headache in the setting of Chronic Hypertension. This is a new problem, onset last night 12/21/2018 around 7pm. She reports taking two exta strength tylenol along with her hypertension medications with the onset of her headache but she did not experience relief. She denies RUQ./upper abdominal pain, visual disturbances, new onset swelling. Patient also denies vaginal bleeding, leaking of fluid, decreased fetal movement, fever, falls, or recent illness.    OB History    Gravida  1   Para  0   Term  0   Preterm  0   AB  0   Living  0     SAB  0   TAB  0   Ectopic  0   Multiple  0   Live Births  0           Past Medical History:  Diagnosis Date  . Diabetes mellitus without complication (HCC)    Type II - gestational with meds  . Gestational diabetes   . Hypertension     Past Surgical History:  Procedure Laterality Date  . addnoids    . TONSILLECTOMY AND ADENOIDECTOMY      Family History  Problem Relation Age of Onset  . Diabetes Mother   . Hypertension Mother   . Hypertension Maternal Grandmother   . Diabetes Maternal Aunt   . Diabetes Maternal Uncle     Social History   Tobacco Use  . Smoking status: Former Smoker    Years: 0.00    Last attempt to quit: 03/30/2006    Years since quitting: 12.7  . Smokeless tobacco: Never Used  Substance Use Topics  . Alcohol use: No  . Drug use: No    Allergies:  Allergies  Allergen Reactions  . Almond (Diagnostic) Hives  . Bee Venom Hives    Medications Prior to Admission  Medication Sig Dispense Refill Last Dose  . aspirin EC 81 MG tablet Take 1 tablet (81 mg total) by mouth daily. Take after 12 weeks for prevention of  preeclampsia later in pregnancy 300 tablet 2 Taking  . cyclobenzaprine (FLEXERIL) 10 MG tablet Take 1 tablet (10 mg total) by mouth 3 (three) times daily as needed for muscle spasms. 30 tablet 2 Taking  . Insulin Glargine (LANTUS SOLOSTAR) 100 UNIT/ML Solostar Pen Inject 50-60 Units into the skin 2 (two) times daily. 60 units in AM and 50 units at bedtime 15 mL 11 Taking  . Insulin Lispro (HUMALOG KWIKPEN) 200 UNIT/ML SOPN Inject 10-15 Units into the skin 3 (three) times daily before meals. 07/11/14 uints 3 mL 3 Taking  . Insulin Pen Needle (PEN NEEDLES) 31G X 5 MM MISC 1 each by Does not apply route 5 (five) times daily as needed. 150 each 2 Taking  . labetalol (NORMODYNE) 300 MG tablet Take 2 tablets (600 mg total) by mouth 3 (three) times daily for 30 days. 180 tablet 0 Taking  . metFORMIN (GLUCOPHAGE) 1000 MG tablet Take 0.5 tablets (500 mg total) by mouth 3 (three) times daily with meals. 180 tablet 3 Taking  . metoCLOPramide (REGLAN) 10 MG tablet Take 10 mg by mouth 4 (four) times daily.   Taking  .  NIFEdipine (PROCARDIA-XL/NIFEDICAL-XL) 30 MG 24 hr tablet Take 1 tablet (30 mg total) by mouth 2 (two) times daily. 30 tablet 2   . omeprazole (PRILOSEC) 10 MG capsule Take 10 mg by mouth daily.   Taking  . ondansetron (ZOFRAN ODT) 4 MG disintegrating tablet Take 1 tablet (4 mg total) by mouth every 6 (six) hours as needed for nausea. (Patient not taking: Reported on 11/29/2018) 20 tablet 2 Not Taking  . Prenatal Multivit-Min-Fe-FA (PRENATAL VITAMINS PO) Take by mouth.   Taking    Review of Systems  Constitutional: Negative for chills, fatigue and fever.  Eyes: Negative for photophobia and visual disturbance.  Respiratory: Negative for shortness of breath.   Gastrointestinal: Negative for abdominal pain.  Genitourinary: Negative for vaginal bleeding, vaginal discharge and vaginal pain.  Musculoskeletal: Negative for back pain.  Neurological: Positive for headaches. Negative for dizziness,  seizures, syncope and weakness.  All other systems reviewed and are negative.  Physical Exam   Blood pressure (!) 162/104, pulse (!) 101, temperature 98.5 F (36.9 C), temperature source Oral, resp. rate 16, height  (1.702 m), weight 112.8 kg, last menstrual period 04/05/2018, SpO2 99 %.  Physical Exam  Nursing note and vitals reviewed. Constitutional: She is oriented to person, place, and time. She appears well-developed and well-nourished.  Cardiovascular: Normal rate.  Respiratory: Effort normal. No respiratory distress.  GI: She exhibits no distension. There is no abdominal tenderness. There is no rebound, no guarding and no CVA tenderness.  Gravid  Musculoskeletal: Normal range of motion.  Neurological: She is alert and oriented to person, place, and time.  Skin: Skin is warm and dry.  Psychiatric: She has a normal mood and affect. Her behavior is normal. Judgment and thought content normal.    MAU Course/MDM  Procedures   --Patient initially declined headache medicine due to concerns about side effect of nausea. Provider at bedside to explain the significance of headache management in early triage of SIPC  Patient Vitals for the past 24 hrs:  BP Temp Temp src Pulse Resp SpO2 Height Weight  12/22/18 0221 (!) 153/101 - - (!) 102 - - - -  12/22/18 0211 (!) 139/96 98.6 F (37 C) Oral (!) 101 16 - - -  12/22/18 0201 (!) 148/100 - - 98 - - - -  12/22/18 0151 (!) 165/102 - - (!) 101 - - - -  12/22/18 0147 (!) 165/102 - - 98 - - - -  12/22/18 0131 (!) 163/99 - - 98 - - - -  12/22/18 0116 (!) 161/99 - - 98 - - - -  12/22/18 0101 (!) 161/99 - - 99 - - - -  12/22/18 0046 (!) 169/103 - - (!) 103 - - - -  12/22/18 0030 (!) 162/104 98.5 F (36.9 C) Oral (!) 101 16 99 % - -  12/22/18 0007 - - - - - -  (1.702 m) 112.8 kg    Results for orders placed or performed during the hospital encounter of 12/22/18 (from the past 24 hour(s))  Urinalysis, Routine w reflex microscopic      Status: Abnormal   Collection Time: 12/22/18 12:14 AM  Result Value Ref Range   Color, Urine STRAW (A) YELLOW   APPearance CLEAR CLEAR   Specific Gravity, Urine 1.008 1.005 - 1.030   pH 7.0 5.0 - 8.0   Glucose, UA NEGATIVE NEGATIVE mg/dL   Hgb urine dipstick NEGATIVE NEGATIVE   Bilirubin Urine NEGATIVE NEGATIVE   Ketones, ur NEGATIVE  NEGATIVE mg/dL   Protein, ur 536 (A) NEGATIVE mg/dL   Nitrite NEGATIVE NEGATIVE   Leukocytes,Ua NEGATIVE NEGATIVE   RBC / HPF 0-5 0 - 5 RBC/hpf   WBC, UA 0-5 0 - 5 WBC/hpf   Bacteria, UA RARE (A) NONE SEEN   Squamous Epithelial / LPF 0-5 0 - 5  Protein / creatinine ratio, urine     Status: Abnormal   Collection Time: 12/22/18 12:14 AM  Result Value Ref Range   Creatinine, Urine 28.90 mg/dL   Total Protein, Urine 340 mg/dL   Protein Creatinine Ratio 11.76 (H) 0.00 - 0.15 mg/mg[Cre]  CBC     Status: Abnormal   Collection Time: 12/22/18  1:01 AM  Result Value Ref Range   WBC 8.0 4.0 - 10.5 K/uL   RBC 4.18 3.87 - 5.11 MIL/uL   Hemoglobin 12.6 12.0 - 15.0 g/dL   HCT 14.4 (L) 31.5 - 40.0 %   MCV 84.9 80.0 - 100.0 fL   MCH 30.1 26.0 - 34.0 pg   MCHC 35.5 30.0 - 36.0 g/dL   RDW 86.7 61.9 - 50.9 %   Platelets 358 150 - 400 K/uL   nRBC 0.0 0.0 - 0.2 %     Assessment and Plan  --33 y.o. G1P0000 at [redacted]w[redacted]d  --Reactive fetal tracing --Superimposed preeclampsia with severe features --Magnesium Sulfate infusion initiated in MAU --Admit to Labor and Delivery, report called to Dr. Elicia Lamp 12/22/2018, 2:41 AM

## 2018-12-22 NOTE — Progress Notes (Signed)
[facility]: Labor Progress Note  Subjective: Headache improved. Not feeling any contractions. No concerns or complaints currently.  Objective: BP (!) 141/99   Pulse 97   Temp 97.9 F (36.6 C) (Oral)   Resp 18   Ht 5\' 7"  (1.702 m)   Wt 112.8 kg   LMP 04/05/2018 (Exact Date)   SpO2 99%   BMI 38.94 kg/m  Gen: no distress Dilation: Closed Effacement (%): Thick Station: -3 Presentation: Vertex Exam by:: Lajuana Matte, RNC  Assessment and Plan: 33 y.o. G1P0000 [redacted]w[redacted]d here for IOL for superimposed pre-eclampsia with severe features. S/p Mag bolus, now on 2g/hr.   Labor: second cytotec at [phone], consider FB placement with next check -- pain control: desires epidural in active labor -- PPH Risk: moderate  Fetal Well-Being: EFW 2851g (83%) at 34w5. Cephalic by RN check.   -- Category 2 - continuous fetal monitoring  -- GBS unknown, starting penicillin but culture collected 3/19 and will continue to monitor for result  -- s/p betamethasone x1 at 0257, next dose due 24hr after   SIP w/ Severe features - HA improved with tylenol and oxycodone, BP elevated but improved since starting Mag  - continue Magnesium 2g/hr  - continue home labetalol 600mg  TID - prn labetalol and hydralazine per protocol   DM2 - light labor, carb modified diet while on cytotec - CBGs q4hr - Lantus 20u BID  Burman Nieves, MD Family Medicine Resident 9:55 AM

## 2018-12-22 NOTE — H&P (Signed)
OBSTETRIC ADMISSION HISTORY AND PHYSICAL  [name] is a 33 y.o. female G1P0000 with IUP at 7163w0d by L/19 presenting for induction of labor for superimposed preeclampsia with severe features by headaches and BP.   Reports fetal movement. Denies vaginal bleeding, leakage of fluids, contractions.   She received her prenatal care at [facility].  Support person in labor: no one at this time  Ultrasounds . 19w2: right lateral placenta  some limited views but overall normal anatomy U/S . 22w5: completion of anatomy wnl . 26w5: EFW 1141g (76%), normal interval growth . 30w5: EFW 2234g (90%), AC>95% . 34w5: EFW 2851g (83%), AC>97%  Prenatal History/Complications: . Type II Diabetes - Lantus 60/50, Lispro 07/11/14, metformin 500mg  TID . Chronic HTN - labetalol 600mg  TID, procardia 30mg  BID . Obesity - BMI 38 . Proteinuria   Past Medical History: Past Medical History:  Diagnosis Date  . Diabetes mellitus without complication (HCC)    Type II - gestational with meds  . Gestational diabetes   . Hypertension     Past Surgical History: Past Surgical History:  Procedure Laterality Date  . addnoids    . TONSILLECTOMY AND ADENOIDECTOMY      Obstetrical History: OB History    Gravida  1   Para  0   Term  0   Preterm  0   AB  0   Living  0     SAB  0   TAB  0   Ectopic  0   Multiple  0   Live Births  0           Social History: Social History   Socioeconomic History  . Marital status: Single    Spouse name: Not on file  . Number of children: Not on file  . Years of education: Not on file  . Highest education level: Not on file  Occupational History  . Not on file  Social Needs  . Financial resource strain: Not hard at all  . Food insecurity:    Worry: Never true    Inability: Never true  . Transportation needs:    Medical: No    Non-medical: No  Tobacco Use  . Smoking status: Former Smoker    Years: 0.00    Last attempt to quit: 03/30/2006     Years since quitting: 12.7  . Smokeless tobacco: Never Used  Substance and Sexual Activity  . Alcohol use: No  . Drug use: No  . Sexual activity: Yes    Birth control/protection: None  Lifestyle  . Physical activity:    Days per week: 2 days    Minutes per session: 30 min  . Stress: Not at all  Relationships  . Social connections:    Talks on phone: More than three times a week    Gets together: More than three times a week    Attends religious service: More than 4 times per year    Active member of club or organization: No    Attends meetings of clubs or organizations: Never    Relationship status: Never married  Other Topics Concern  . Not on file  Social History Narrative  . Not on file    Family History: Family History  Problem Relation Age of Onset  . Diabetes Mother   . Hypertension Mother   . Hypertension Maternal Grandmother   . Diabetes Maternal Aunt   . Diabetes Maternal Uncle     Allergies: Allergies  Allergen Reactions  .  Almond (Diagnostic) Hives  . Bee Venom Hives    Medications Prior to Admission  Medication Sig Dispense Refill Last Dose  . aspirin EC 81 MG tablet Take 1 tablet (81 mg total) by mouth daily. Take after 12 weeks for prevention of preeclampsia later in pregnancy 300 tablet 2 12/21/2018 at Unknown time  . cyclobenzaprine (FLEXERIL) 10 MG tablet Take 1 tablet (10 mg total) by mouth 3 (three) times daily as needed for muscle spasms. 30 tablet 2 Past Month at Unknown time  . Insulin Glargine (LANTUS SOLOSTAR) 100 UNIT/ML Solostar Pen Inject 50-60 Units into the skin 2 (two) times daily. 60 units in AM and 50 units at bedtime 15 mL 11 12/22/2018 at Unknown time  . Insulin Lispro (HUMALOG KWIKPEN) 200 UNIT/ML SOPN Inject 10-15 Units into the skin 3 (three) times daily before meals. 07/11/14 uints 3 mL 3 12/22/2018 at Unknown time  . labetalol (NORMODYNE) 300 MG tablet Take 2 tablets (600 mg total) by mouth 3 (three) times daily for 30 days. 180  tablet 0 12/22/2018 at Unknown time  . NIFEdipine (PROCARDIA-XL/NIFEDICAL-XL) 30 MG 24 hr tablet Take 1 tablet (30 mg total) by mouth 2 (two) times daily. 30 tablet 2 12/21/2018 at Unknown time  . omeprazole (PRILOSEC) 10 MG capsule Take 10 mg by mouth daily.   Past Week at Unknown time  . Prenatal Multivit-Min-Fe-FA (PRENATAL VITAMINS PO) Take by mouth.   12/21/2018 at Unknown time  . Insulin Pen Needle (PEN NEEDLES) 31G X 5 MM MISC 1 each by Does not apply route 5 (five) times daily as needed. 150 each 2 Taking  . metFORMIN (GLUCOPHAGE) 1000 MG tablet Take 0.5 tablets (500 mg total) by mouth 3 (three) times daily with meals. 180 tablet 3 Taking  . metoCLOPramide (REGLAN) 10 MG tablet Take 10 mg by mouth 4 (four) times daily.   More than a month at Unknown time  . ondansetron (ZOFRAN ODT) 4 MG disintegrating tablet Take 1 tablet (4 mg total) by mouth every 6 (six) hours as needed for nausea. (Patient not taking: Reported on 11/29/2018) 20 tablet 2 Not Taking     Review of Systems  All systems reviewed and negative except as stated in HPI  Blood pressure 130/89, pulse 94, temperature 98.5 F (36.9 C), temperature source Oral, resp. rate 18, height 5\' 7"  (1.702 m), weight 112.8 kg, last menstrual period 04/05/2018, SpO2 99 %. General appearance: alert, well-appearing, NAD  Lungs: no respiratory distress Heart: regular rate  Abdomen: soft, non-tender; gravid  Pelvic: deferred Extremities: no significant LE edema Presentation: cephalic Fetal monitoring: 130s/mod/+a/-d Uterine activity: no contractions    Prenatal labs: ABO, Rh: --/--/AB POS (03/22 0105) Antibody: NEG (03/22 0105) Rubella: 1.73 (09/19 1140) RPR: Non Reactive (01/30 1441)  HBsAg: Negative (09/19 1140)  HIV: Non Reactive (01/30 1441)  GBS:   unknown Glucola: n/a Genetic screening:  Low risk NIPS  Prenatal Transfer Tool  Maternal Diabetes: Yes:  Diabetes Type:  Insulin/Medication controlled Genetic Screening:  Normal Maternal Ultrasounds/Referrals: Normal Fetal Ultrasounds or other Referrals:  Fetal echo Maternal Substance Abuse:  No Significant Maternal Medications:  Insulin (lantus/lispro), metformin, labetalol, procardia, PNV Significant Maternal Lab Results: proteinuria   Results for orders placed or performed during the hospital encounter of 12/22/18 (from the past 24 hour(s))  Urinalysis, Routine w reflex microscopic   Collection Time: 12/22/18 12:14 AM  Result Value Ref Range   Color, Urine STRAW (A) YELLOW   APPearance CLEAR CLEAR   Specific Gravity, Urine 1.008  1.005 - 1.030   pH 7.0 5.0 - 8.0   Glucose, UA NEGATIVE NEGATIVE mg/dL   Hgb urine dipstick NEGATIVE NEGATIVE   Bilirubin Urine NEGATIVE NEGATIVE   Ketones, ur NEGATIVE NEGATIVE mg/dL   Protein, ur 147 (A) NEGATIVE mg/dL   Nitrite NEGATIVE NEGATIVE   Leukocytes,Ua NEGATIVE NEGATIVE   RBC / HPF 0-5 0 - 5 RBC/hpf   WBC, UA 0-5 0 - 5 WBC/hpf   Bacteria, UA RARE (A) NONE SEEN   Squamous Epithelial / LPF 0-5 0 - 5  Protein / creatinine ratio, urine   Collection Time: 12/22/18 12:14 AM  Result Value Ref Range   Creatinine, Urine 28.90 mg/dL   Total Protein, Urine 340 mg/dL   Protein Creatinine Ratio 11.76 (H) 0.00 - 0.15 mg/mg[Cre]  Comprehensive metabolic panel   Collection Time: 12/22/18  1:01 AM  Result Value Ref Range   Sodium 135 135 - 145 mmol/L   Potassium 3.9 3.5 - 5.1 mmol/L   Chloride 106 98 - 111 mmol/L   CO2 21 (L) 22 - 32 mmol/L   Glucose, Bld 152 (H) 70 - 99 mg/dL   BUN 7 6 - 20 mg/dL   Creatinine, Ser 8.29 0.44 - 1.00 mg/dL   Calcium 8.7 (L) 8.9 - 10.3 mg/dL   Total Protein 6.4 (L) 6.5 - 8.1 g/dL   Albumin 2.0 (L) 3.5 - 5.0 g/dL   AST 18 15 - 41 U/L   ALT 15 0 - 44 U/L   Alkaline Phosphatase 126 38 - 126 U/L   Total Bilirubin 0.7 0.3 - 1.2 mg/dL   GFR calc non Af Amer >60 >60 mL/min   GFR calc Af Amer >60 >60 mL/min   Anion gap 8 5 - 15  CBC   Collection Time: 12/22/18  1:01 AM  Result Value  Ref Range   WBC 8.0 4.0 - 10.5 K/uL   RBC 4.18 3.87 - 5.11 MIL/uL   Hemoglobin 12.6 12.0 - 15.0 g/dL   HCT 56.2 (L) 13.0 - 86.5 %   MCV 84.9 80.0 - 100.0 fL   MCH 30.1 26.0 - 34.0 pg   MCHC 35.5 30.0 - 36.0 g/dL   RDW 78.4 69.6 - 29.5 %   Platelets 358 150 - 400 K/uL   nRBC 0.0 0.0 - 0.2 %  Type and screen   Collection Time: 12/22/18  1:05 AM  Result Value Ref Range   ABO/RH(D) AB POS    Antibody Screen NEG    Sample Expiration      12/25/2018 Performed at [facility], [address], [place], [place] [address]   Glucose, capillary   Collection Time: 12/22/18  2:54 AM  Result Value Ref Range   Glucose-Capillary 138 (H) 70 - 99 mg/dL    Patient Active Problem List   Diagnosis Date Noted  . Encounter for induction of labor 12/22/2018  . Severe preeclampsia 12/22/2018  . Chronic hypertension with superimposed preeclampsia 12/16/2018  . Pap smear abnormality of cervix/human papillomavirus (HPV) positive 06/25/2018  . Proteinuria affecting pregnancy 06/24/2018  . Supervision of high risk pregnancy, antepartum, first trimester 06/20/2018  . Type 2 diabetes mellitus during pregnancy 04/14/2016  . BMI 33.0-33.9,adult 03/30/2016  . Chronic hypertension affecting pregnancy 03/30/2016    Assessment/Plan:  [name] is a 33 y.o. G1P0000 at [redacted]w[redacted]d here for IOL for superimposed preeclampsia now with severe features.   Labor: Induction to start with FB and cytotec.  -- pain control: ultimately desires epidural  Superimposed Preeclampsia with Severe Features (HA, BP): Chronic HTN with worsening blood pressures recently. Currently taking labetalol 600mg  TID outpatient with procardia 30mg  BID. Worsening UPC no up to 11.76 from 4.48 2 weeks ago. AST/ALT, platelets within normal limits. No improvement in headache with taking Tylenol. Given oxycodone in MAU. Received multiple pushes of IV labetalol in MAU (20/40/80) -- Mg++ bolus + 2g/hr -- continue labetalol 600mg  TID  -- IV  hydralazine BP protocol prn   Type II DM: No recent A1C in system but notes seem to indicate less than adequate control. Home Lantus 50/60 BID, Lispro 07/11/14, metformin 500mg  TID.  -- light labor carb modified diet -- monitor CBG q4hrs -- Lantus 20U BID   Fetal Wellbeing: EFW 2851g (83%) at 34w5 by Leopold's. Cephalic by RN check.  -- GBS (unknown) - culture pending but will given PCN until resulted  -- continuous fetal monitoring - category I  -- will give BMZ  Postpartum Planning -- breast/BTL -- RI/[x] Tdap/[x] flu   Allison Mcgough S. Earlene Plater, DO OB/GYN Fellow

## 2018-12-22 NOTE — Progress Notes (Signed)
Labor Progress Note [name] is a 33 y.o. G1P0000 at [redacted]w[redacted]d presented for IOL for CHTN with SIPE  S:  Patient comfortable. Denies HA, visual changes or epigastric pain  O:  BP 127/83   Pulse 96   Temp 98.8 F (37.1 C) (Oral)   Resp 17   Ht 5\' 7"  (1.702 m)   Wt 112.8 kg   LMP 04/05/2018 (Exact Date)   SpO2 99%   BMI 38.94 kg/m   Fetal Tracing:  Baseline:125 Variability: moderate Accels: 15x15 Decels: none  Toco: none  CVE: Dilation: Closed Effacement (%): Thick Station: -3 Presentation: Vertex Exam by:: J. Abrham, (resisdent)   A&P: 33 y.o. G1P0000 [redacted]w[redacted]d IOL for CHTN with SIPE #Labor: S/p 4 doses of vaginal cytotec. Cervix unchanged. Discussed with patient placement of foley balloon for cervical ripening. Risks and benefits reviewed. Patient agreeable to plan of care. Foley balloon inserted without difficulty and inflated with 60cc sterile water. Patient tolerated procedure well. Will change cytotec to oral #Pain: per patient request #FWB: Cat 1 #GBS positive  Rolm Bookbinder, CNM 10:57 PM

## 2018-12-22 NOTE — MAU Note (Signed)
Pt states she is being followed prenatally for chronic hypertension, takes labetalol 600mg  TID, procardia 30mg  (started last night).  States h/a past 24hrs that is not relieved by meds.  Denies visual disturbances or epigastric pain.  Denies LOF, vaginal bleeding, or reg UCs.  + FM.

## 2018-12-22 NOTE — Progress Notes (Signed)
OB/GYN Faculty Practice: Labor Progress Note  Subjective: No complaints, doing well. Feeling the occasional contraction but not very strong  Objective: BP 130/86   Pulse 94   Temp 97.6 F (36.4 C) (Axillary)   Resp 18   Ht 5\' 7"  (1.702 m)   Wt 112.8 kg   LMP 04/05/2018 (Exact Date)   SpO2 99%   BMI 38.94 kg/m  Gen: no distress Dilation: Closed Effacement (%): Thick Station: -3 Presentation: Vertex Exam by:: J. Abrham, (resisdent)  Assessment and Plan: 33 y.o. G1P0000 [redacted]w[redacted]d here for IOL for superimposed pre-eclampsia with severe features. S/p Mag bolus, now on 2g/hr.   Labor: second vaginal cytotec placed, still closed, plan for FB at next check -- pain control: desires epidural in active labor -- PPH Risk: moderate  Fetal Well-Being: [id number] (83%) at 34w5. Cephalic by exam. -- Category 11 - continuous fetal monitoring  -- GBS positive - penicillin q4hr  SIP w/ Severe features - BP improved, continue to monitor q56min - continue Magnesium 2g/hr  - continue home labetalol 600mg  TID - prn labetalol and hydralazine per protocol   DM2 - light labor, carb modified diet while on cytotec - CBGs q4hr - Lantus 20u BID  Burman Nieves, MD Family Medicine Resident 12:53 PM

## 2018-12-22 NOTE — Progress Notes (Signed)
OB/GYN Faculty Practice: Labor Progress Note  Subjective: Not feeling any contractions. Had some nausea and vomiting after eating a light dinner earlier. Nausea improved with zofran. No headaches, vision changes or shortness of breath.   Objective: BP 106/66   Pulse 92   Temp (!) 97.5 F (36.4 C) (Axillary)   Resp 18   Ht 5\' 7"  (1.702 m)   Wt 112.8 kg   LMP 04/05/2018 (Exact Date)   SpO2 99%   BMI 38.94 kg/m  Gen: well appearing, no distress Dilation: Closed Effacement (%): Thick Station: -3 Presentation: Vertex Exam by:: J. Abrham, (resisdent)  Assessment and Plan: 33 y.o. G1P0000 [redacted]w[redacted]d here for IOL for superimposed pre-eclampsia with severe features. S/p Mag bolus, now on 2g/hr.  Labor: internal os still closed, fourth vaginal cytotec placed with last check -- pain control: desires epidural in active albor -- PPH Risk: moderate  Fetal Well-Being: [id number] (83%) at 34w5. Cephalic byexam. -- not tracing well but what is  Tracing is Category 1 - continuous fetal monitoring  -- GBS positive - continue penicillin q4hr  SIP w/ Severe features - BP improved, continue to monitor q72min - continue Magnesium 2g/hr  - continue home labetalol 600mg  TID - prn labetalol and hydralazine per protocol   DM2 -light labor, carb modified diet while on cytotec - CBGs q4hr - started on insulin gtt for elevated blood sugar of 274 this afternoon  Burman Nieves, MD Family Medicine Resident 7:15 PM

## 2018-12-23 LAB — CBC
HCT: 35.4 % — ABNORMAL LOW (ref 36.0–46.0)
HEMOGLOBIN: 12.4 g/dL (ref 12.0–15.0)
MCH: 29.7 pg (ref 26.0–34.0)
MCHC: 35 g/dL (ref 30.0–36.0)
MCV: 84.9 fL (ref 80.0–100.0)
Platelets: 366 10*3/uL (ref 150–400)
RBC: 4.17 MIL/uL (ref 3.87–5.11)
RDW: 13.2 % (ref 11.5–15.5)
WBC: 11.4 10*3/uL — ABNORMAL HIGH (ref 4.0–10.5)
nRBC: 0 % (ref 0.0–0.2)

## 2018-12-23 LAB — GLUCOSE, CAPILLARY
GLUCOSE-CAPILLARY: 169 mg/dL — AB (ref 70–99)
GLUCOSE-CAPILLARY: 135 mg/dL — AB (ref 70–99)
Glucose-Capillary: 101 mg/dL — ABNORMAL HIGH (ref 70–99)
Glucose-Capillary: 105 mg/dL — ABNORMAL HIGH (ref 70–99)
Glucose-Capillary: 109 mg/dL — ABNORMAL HIGH (ref 70–99)
Glucose-Capillary: 115 mg/dL — ABNORMAL HIGH (ref 70–99)
Glucose-Capillary: 120 mg/dL — ABNORMAL HIGH (ref 70–99)
Glucose-Capillary: 121 mg/dL — ABNORMAL HIGH (ref 70–99)
Glucose-Capillary: 125 mg/dL — ABNORMAL HIGH (ref 70–99)
Glucose-Capillary: 126 mg/dL — ABNORMAL HIGH (ref 70–99)
Glucose-Capillary: 126 mg/dL — ABNORMAL HIGH (ref 70–99)
Glucose-Capillary: 128 mg/dL — ABNORMAL HIGH (ref 70–99)
Glucose-Capillary: 131 mg/dL — ABNORMAL HIGH (ref 70–99)
Glucose-Capillary: 133 mg/dL — ABNORMAL HIGH (ref 70–99)
Glucose-Capillary: 133 mg/dL — ABNORMAL HIGH (ref 70–99)
Glucose-Capillary: 166 mg/dL — ABNORMAL HIGH (ref 70–99)
Glucose-Capillary: 83 mg/dL (ref 70–99)
Glucose-Capillary: 84 mg/dL (ref 70–99)
Glucose-Capillary: 88 mg/dL (ref 70–99)
Glucose-Capillary: 89 mg/dL (ref 70–99)
Glucose-Capillary: 89 mg/dL (ref 70–99)
Glucose-Capillary: 97 mg/dL (ref 70–99)
Glucose-Capillary: 98 mg/dL (ref 70–99)

## 2018-12-23 LAB — GC/CHLAMYDIA PROBE AMP (~~LOC~~) NOT AT ARMC
Chlamydia: NEGATIVE
Neisseria Gonorrhea: NEGATIVE

## 2018-12-23 LAB — COMPREHENSIVE METABOLIC PANEL
ALK PHOS: 132 U/L — AB (ref 38–126)
ALT: 17 U/L (ref 0–44)
AST: 17 U/L (ref 15–41)
Albumin: 2.1 g/dL — ABNORMAL LOW (ref 3.5–5.0)
Anion gap: 9 (ref 5–15)
BUN: 15 mg/dL (ref 6–20)
CALCIUM: 7.7 mg/dL — AB (ref 8.9–10.3)
CO2: 19 mmol/L — AB (ref 22–32)
Chloride: 103 mmol/L (ref 98–111)
Creatinine, Ser: 0.87 mg/dL (ref 0.44–1.00)
GFR calc Af Amer: >60 mL/min (ref >60)
GFR calc non Af Amer: >60 mL/min (ref >60)
Glucose, Bld: 129 mg/dL — ABNORMAL HIGH (ref 70–99)
Potassium: 4.6 mmol/L (ref 3.5–5.1)
SODIUM: 131 mmol/L — AB (ref 135–145)
Total Bilirubin: 0.5 mg/dL (ref 0.3–1.2)
Total Protein: 6.5 g/dL (ref 6.5–8.1)

## 2018-12-23 LAB — MAGNESIUM: Magnesium: 6.2 mg/dL (ref 1.7–2.4)

## 2018-12-23 MED ORDER — OXYTOCIN 40 UNITS IN NORMAL SALINE INFUSION - SIMPLE MED
1.0000 m[IU]/min | INTRAVENOUS | Status: DC
  Administered 2018-12-23: 2 m[IU]/min via INTRAVENOUS
  Filled 2018-12-23: qty 1000

## 2018-12-23 NOTE — Progress Notes (Signed)
OB/GYN Faculty Practice: Labor Progress Note  Subjective: Doing well this morning, no complaints. States lots of cramping overnight with FB placement but nothing now.   Objective: BP 130/83   Pulse 90   Temp 98.5 F (36.9 C) (Oral)   Resp 18   Ht 5\' 7"  (1.702 m)   Wt 112.8 kg   LMP 04/05/2018 (Exact Date)   SpO2 99%   BMI 38.94 kg/m  Gen: well-appearing, NAD Dilation: Closed Effacement (%): Thick Station: -3 Presentation: Vertex Exam by:: Cleone Slim CNM  Assessment and Plan: 33 y.o. G1P0000 [redacted]w[redacted]d here for IOl for superimposed preeclampsia with severe features.   Labor: Induction with FB inserted overnight and cytotec. Has received 7 doses of cytotec. FB still in place. Will plan to switch to low-dose pitocin at next check.  -- pain control: controlled  -- PPH Risk: high   Superimposed Preeclampsia with Severe Features: BP well-controlled. Adequate UOP. Last labs over 24-hours ago. CMP and CBC wnl. UPC 11.76.  -- continue labetalol 600mg  TID -- continue Mg++ -- daily CBC, CMP  Type II DM: Elevated BG here so now on glucose stabilizer.  -- continue hourly glucose checks and gtt   Fetal Well-Being: EFW 2851g (83%). Cephalic by RN check.  -- Category I - continuous fetal monitoring  -- GBS positive - PCN -- has received 2 doses of BMZ (3/22-3/23)  Joshua Soulier S. Earlene Plater, DO OB/GYN Fellow, Faculty Practice  9:36 AM

## 2018-12-23 NOTE — Progress Notes (Signed)
OB/GYN Faculty Practice: Labor Progress Note  Subjective: Doing well. Not feeling any contractions yet. Tolerated broth well for dinner. Denies headache and vision changes.   Objective: BP 132/87   Pulse 89   Temp (!) 97.4 F (36.3 C) (Oral)   Resp 15   Ht 5\' 7"  (1.702 m)   Wt 112.8 kg   LMP 04/05/2018 (Exact Date)   SpO2 99%   BMI 38.94 kg/m  Gen: well appearing Dilation: 4.5 Effacement (%): 50 Station: -3 Presentation: Vertex Exam by:: Dr. Mauri Reading  Assessment and Plan: 33 y.o. G1P0000 [redacted]w[redacted]d here for IOL for superimposed pre-eclampsia with severe features.    Labor: increasing pitocin 2x2 -- pain control: desires epidural in active labor -- PPH Risk: moderate  Fetal Well-Being: EFW 2851g (83%) at 34w5. Cephalic byexam. -- Category 2 - continuous fetal monitoring  -- GBS positive - penicillin q4hr  SIP w/ Severe features -BP elevated this afternoon but now better controled, 132/87 most recently, continue to monitor q7min - continue Magnesium 2g/hr  - continue home labetalol 600mg  TID - prn labetalol and hydralazine per protocol   DM2 -on insulin gtt, most recent glucose 83 - CBGs q1hr  Burman Nieves, MD Family Medicine Resident 8:47 PM

## 2018-12-23 NOTE — Progress Notes (Signed)
Labor Progress Note [name] is a 33 y.o. G1P0000 at [redacted]w[redacted]d presented for IOL for CHTN with SIPE  S:  Patient sleeping. When awake, denies HA, visual changes or epigastric pain.  O:  BP 130/83   Pulse 90   Temp 98.5 F (36.9 C) (Oral)   Resp 18   Ht 5\' 7"  (1.702 m)   Wt 112.8 kg   LMP 04/05/2018 (Exact Date)   SpO2 99%   BMI 38.94 kg/m   Fetal Tracing:  Baseline: 130 Variability: moderate Accels: 10x10 Decels: none  Toco: occasional uc's   CVE: Dilation: Closed Effacement (%): Thick Station: -3 Presentation: Vertex Exam by:: Cleone Slim CNM   A&P: 33 y.o. G1P0000 [redacted]w[redacted]d IOL CHTN with SIPE #Labor: FB still in place. Will do second oral cytotec. #Pain: n/a #FWB: Cat 1 #GBS positive  Rolm Bookbinder, CNM 7:53 AM

## 2018-12-23 NOTE — Progress Notes (Signed)
Labor Progress Note Mati Griswell is a 33 y.o. G1P0000 at [redacted]w[redacted]d presented for IOL for CHTN with SIPE  S:  Patient reporting more uncomfortable contractions. Foley in place  O:  BP 120/82   Pulse 89   Temp 98.8 F (37.1 C) (Oral)   Resp 17   Ht 5\' 7"  (1.702 m)   Wt 112.8 kg   LMP 04/05/2018 (Exact Date)   SpO2 99%   BMI 38.94 kg/m   Fetal Tracing:  Baseline: 130 Variability:  moderate Accels: 15x15 Decels: none  Toco: occasional uc's   CVE: Dilation: Closed Effacement (%): Thick Station: -3 Presentation: Vertex Exam by:: Cleone Slim CNM   A&P: 33 y.o. G1P0000 [redacted]w[redacted]d IOL CHTN with SIPE #Labor: Continue current plan of care. Discussed pain medication options and patient wants to wait for now #Pain: per patient request #FWB: Cat 1 #GBS positive   Rolm Bookbinder, CNM 4:55 AM

## 2018-12-23 NOTE — Progress Notes (Signed)
LABOR PROGRESS NOTE  [name] is a 33 y.o. G1P0000 at [redacted]w[redacted]d  admitted for IOL for cHTN with superimposed preeclampsia.  Subjective: Patient doing well. Not really feeling contractions. No concerns or complaints. Received page from nurse stating patient appears more sleepy.  Objective: BP (!) 148/100   Pulse 90   Temp 98.2 F (36.8 C)   Resp 18   Ht 5\' 7"  (1.702 m)   Wt 112.8 kg   LMP 04/05/2018 (Exact Date)   SpO2 99%   BMI 38.94 kg/m  or  Vitals:   12/23/18 1501 12/23/18 1531 12/23/18 1602 12/23/18 1631  BP: (!) 134/95 126/83 137/81 (!) 148/100  Pulse: 88 89 90 90  Resp: 18 18 18 18   Temp:      TempSrc:      SpO2:      Weight:      Height:       Dilation: 4.5 Effacement (%): 50 Station: -3 Presentation: Vertex Exam by:: Dr. Mauri Reading FHT: baseline rate 125, moderate varibility, + acel, no decel Toco: ctx q 4 mins   Labs: Lab Results  Component Value Date   WBC 11.4 (H) 12/23/2018   HGB 12.4 12/23/2018   HCT 35.4 (L) 12/23/2018   MCV 84.9 12/23/2018   PLT 366 12/23/2018    Patient Active Problem List   Diagnosis Date Noted  . Encounter for induction of labor 12/22/2018  . Severe preeclampsia 12/22/2018  . Chronic hypertension with superimposed preeclampsia 12/16/2018  . Pap smear abnormality of cervix/human papillomavirus (HPV) positive 06/25/2018  . Proteinuria affecting pregnancy 06/24/2018  . Supervision of high risk pregnancy, antepartum, first trimester 06/20/2018  . Type 2 diabetes mellitus during pregnancy 04/14/2016  . BMI 33.0-33.9,adult 03/30/2016  . Chronic hypertension affecting pregnancy 03/30/2016    Assessment / Plan: 33 y.o. G1P0000 at [redacted]w[redacted]d here for IOL for cHTN with superimposed preeclampsia.  Labor: Progressing slowly. Contractions not adequate. Continue to titrate Pitocin.  Fetal Wellbeing:  Cat I Pain Control:  IV pain meds/Epidural upon request Anticipated MOD:  NSVD  cHTN with Superimposed Pre-E: Mag level therapeutic at  6.2. Continue Mag++. Continues to have good UOP (2.5L today), Cr WNL. IV labetolol/hydral for severe range BP's.    Orpah Cobb, D.O. [facility], PGY1 12/23/2018, 3:30 PM

## 2018-12-24 ENCOUNTER — Encounter (HOSPITAL_COMMUNITY): Payer: Self-pay | Admitting: *Deleted

## 2018-12-24 ENCOUNTER — Inpatient Hospital Stay (HOSPITAL_COMMUNITY): Payer: BLUE CROSS/BLUE SHIELD | Admitting: Anesthesiology

## 2018-12-24 LAB — CBC
HCT: 37.6 % (ref 36.0–46.0)
HCT: 36.3 % (ref 36.0–46.0)
HEMOGLOBIN: 12.3 g/dL (ref 12.0–15.0)
Hemoglobin: 12.5 g/dL (ref 12.0–15.0)
MCH: 28.5 pg (ref 26.0–34.0)
MCH: 29 pg (ref 26.0–34.0)
MCHC: 33.2 g/dL (ref 30.0–36.0)
MCHC: 33.9 g/dL (ref 30.0–36.0)
MCV: 85.6 fL (ref 80.0–100.0)
MCV: 85.6 fL (ref 80.0–100.0)
Platelets: 356 10*3/uL (ref 150–400)
Platelets: 356 10*3/uL (ref 150–400)
RBC: 4.39 MIL/uL (ref 3.87–5.11)
RBC: 4.24 MIL/uL (ref 3.87–5.11)
RDW: 13.2 % (ref 11.5–15.5)
RDW: 13.5 % (ref 11.5–15.5)
WBC: 11.3 10*3/uL — AB (ref 4.0–10.5)
WBC: 10.3 10*3/uL (ref 4.0–10.5)
nRBC: 0 % (ref 0.0–0.2)
nRBC: 0 % (ref 0.0–0.2)

## 2018-12-24 LAB — GLUCOSE, CAPILLARY
GLUCOSE-CAPILLARY: 74 mg/dL (ref 70–99)
GLUCOSE-CAPILLARY: 134 mg/dL — AB (ref 70–99)
GLUCOSE-CAPILLARY: 97 mg/dL (ref 70–99)
Glucose-Capillary: 103 mg/dL — ABNORMAL HIGH (ref 70–99)
Glucose-Capillary: 104 mg/dL — ABNORMAL HIGH (ref 70–99)
Glucose-Capillary: 104 mg/dL — ABNORMAL HIGH (ref 70–99)
Glucose-Capillary: 111 mg/dL — ABNORMAL HIGH (ref 70–99)
Glucose-Capillary: 117 mg/dL — ABNORMAL HIGH (ref 70–99)
Glucose-Capillary: 117 mg/dL — ABNORMAL HIGH (ref 70–99)
Glucose-Capillary: 124 mg/dL — ABNORMAL HIGH (ref 70–99)
Glucose-Capillary: 126 mg/dL — ABNORMAL HIGH (ref 70–99)
Glucose-Capillary: 139 mg/dL — ABNORMAL HIGH (ref 70–99)
Glucose-Capillary: 149 mg/dL — ABNORMAL HIGH (ref 70–99)
Glucose-Capillary: 80 mg/dL (ref 70–99)
Glucose-Capillary: 80 mg/dL (ref 70–99)
Glucose-Capillary: 84 mg/dL (ref 70–99)
Glucose-Capillary: 87 mg/dL (ref 70–99)
Glucose-Capillary: 91 mg/dL (ref 70–99)
Glucose-Capillary: 96 mg/dL (ref 70–99)
Glucose-Capillary: 99 mg/dL (ref 70–99)
Glucose-Capillary: 99 mg/dL (ref 70–99)

## 2018-12-24 LAB — COMPREHENSIVE METABOLIC PANEL
ALT: 19 U/L (ref 0–44)
AST: 25 U/L (ref 15–41)
Albumin: 2 g/dL — ABNORMAL LOW (ref 3.5–5.0)
Alkaline Phosphatase: 134 U/L — ABNORMAL HIGH (ref 38–126)
Anion gap: 15 (ref 5–15)
BUN: 10 mg/dL (ref 6–20)
CO2: 19 mmol/L — ABNORMAL LOW (ref 22–32)
Calcium: 7.6 mg/dL — ABNORMAL LOW (ref 8.9–10.3)
Chloride: 103 mmol/L (ref 98–111)
Creatinine, Ser: 0.85 mg/dL (ref 0.44–1.00)
GFR calc Af Amer: >60 mL/min (ref >60)
GFR calc non Af Amer: >60 mL/min (ref >60)
Glucose, Bld: 85 mg/dL (ref 70–99)
Potassium: 4 mmol/L (ref 3.5–5.1)
Sodium: 137 mmol/L (ref 135–145)
Total Bilirubin: 0.6 mg/dL (ref 0.3–1.2)
Total Protein: 6.5 g/dL (ref 6.5–8.1)

## 2018-12-24 LAB — MAGNESIUM: Magnesium: 6.3 mg/dL (ref 1.7–2.4)

## 2018-12-24 MED ORDER — EPHEDRINE 5 MG/ML INJ: 10.0000 mg | INTRAVENOUS | Status: DC | PRN

## 2018-12-24 MED ORDER — LIDOCAINE-EPINEPHRINE (PF) 2 %-1:200000 IJ SOLN
INTRAMUSCULAR | Status: DC | PRN
  Administered 2018-12-24: 5 mL via EPIDURAL
  Administered 2018-12-24: 2 mL via EPIDURAL
  Administered 2018-12-25: 3 mL via EPIDURAL
  Administered 2018-12-25 (×2): 5 mL via EPIDURAL

## 2018-12-24 MED ORDER — HYDROXYZINE HCL 25 MG PO TABS
25.0000 mg | ORAL_TABLET | Freq: Three times a day (TID) | ORAL | Status: DC | PRN
  Filled 2018-12-24 (×2): qty 1

## 2018-12-24 MED ORDER — TERBUTALINE SULFATE 1 MG/ML IJ SOLN: 0.2500 mg | Freq: Once | INTRAMUSCULAR | Status: DC | PRN

## 2018-12-24 MED ORDER — LACTATED RINGERS IV SOLN: 500.0000 mL | Freq: Once | INTRAVENOUS | Status: DC

## 2018-12-24 MED ORDER — SODIUM CHLORIDE (PF) 0.9 % IJ SOLN
INTRAMUSCULAR | Status: DC | PRN
  Administered 2018-12-24: 12 mL/h via EPIDURAL

## 2018-12-24 MED ORDER — FAMOTIDINE IN NACL 20-0.9 MG/50ML-% IV SOLN: 20.0000 mg | Freq: Once | INTRAVENOUS | Status: DC

## 2018-12-24 MED ORDER — DIPHENHYDRAMINE HCL 50 MG/ML IJ SOLN: 12.5000 mg | INTRAMUSCULAR | Status: DC | PRN

## 2018-12-24 MED ORDER — FUROSEMIDE 10 MG/ML IJ SOLN
20.0000 mg | Freq: Once | INTRAMUSCULAR | Status: AC
  Administered 2018-12-24: 20 mg via INTRAVENOUS
  Filled 2018-12-24: qty 2

## 2018-12-24 MED ORDER — FAMOTIDINE 20 MG IN NS 100 ML IVPB
20.0000 mg | Freq: Once | INTRAVENOUS | Status: AC
  Administered 2018-12-24: 20 mg via INTRAVENOUS
  Filled 2018-12-24: qty 100

## 2018-12-24 MED ORDER — FENTANYL-BUPIVACAINE-NACL 0.5-0.125-0.9 MG/250ML-% EP SOLN
12.0000 mL/h | EPIDURAL | Status: DC | PRN
  Filled 2018-12-24: qty 250

## 2018-12-24 MED ORDER — PHENYLEPHRINE 40 MCG/ML (10ML) SYRINGE FOR IV PUSH (FOR BLOOD PRESSURE SUPPORT): 80.0000 ug | PREFILLED_SYRINGE | INTRAVENOUS | Status: DC | PRN

## 2018-12-24 MED ORDER — OXYTOCIN 40 UNITS IN NORMAL SALINE INFUSION - SIMPLE MED
1.0000 m[IU]/min | INTRAVENOUS | Status: DC
  Administered 2018-12-25: 26 m[IU]/min via INTRAVENOUS
  Filled 2018-12-24: qty 1000

## 2018-12-24 NOTE — Progress Notes (Signed)
LABOR PROGRESS NOTE  [name] is a 33 y.o. G1P0000 at [redacted]w[redacted]d  admitted for  IOL for superimposed pre-eclampsia with severe features.  Subjective: Feeling much better after a shower and eating. Denies any contractions. Denies any headache, vision changes, or RUQ pain  Objective: BP 126/83   Pulse 82   Temp (!) 97.2 F (36.2 C) (Oral) Comment (Src): 97.1 axillary  Resp 16   Ht 5\' 7"  (1.702 m)   Wt 112.8 kg   LMP 04/05/2018 (Exact Date)   SpO2 99%   BMI 38.94 kg/m  or  Vitals:   12/24/18 1300 12/24/18 1400 12/24/18 1543 12/24/18 1601  BP:   127/86 126/83  Pulse:   81 82  Resp:  18 18 16   Temp: (!) 97.2 F (36.2 C)     TempSrc: Oral     SpO2:      Weight:      Height:       Dilation: 4 Effacement (%): 50 Cervical Position: Posterior Station: -3 Presentation: Vertex Exam by:: Dr. Mauri Reading FHT: baseline rate 125, min to moderate varibility, + acel, no decel Toco: rare  Labs: Lab Results  Component Value Date   WBC 11.3 (H) 12/24/2018   HGB 12.5 12/24/2018   HCT 37.6 12/24/2018   MCV 85.6 12/24/2018   PLT 356 12/24/2018    Patient Active Problem List   Diagnosis Date Noted  . Encounter for induction of labor 12/22/2018  . Severe preeclampsia 12/22/2018  . Chronic hypertension with superimposed preeclampsia 12/16/2018  . Pap smear abnormality of cervix/human papillomavirus (HPV) positive 06/25/2018  . Proteinuria affecting pregnancy 06/24/2018  . Supervision of high risk pregnancy, antepartum, first trimester 06/20/2018  . Type 2 diabetes mellitus during pregnancy 04/14/2016  . BMI 33.0-33.9,adult 03/30/2016  . Chronic hypertension affecting pregnancy 03/30/2016    Assessment / Plan: 33 y.o. G1P0000 at [redacted]w[redacted]d here for  IOL for superimposed pre-eclampsia with severe features.  Labor: S/p 4 hour pitocin break. Attempted AROM but cervix difficult to reach and unable to palpate bag again. Appears patient is more posterior and more closed than prior exam. Exam  very uncomfortable for patient. Offered Fentanyl but given difficulty, opted to hold off on AROM. Will restart pitocin at this time. Will consider AROM at next cervical check.  Fetal Wellbeing:  Cat II - continuous fetal monitoring Pain Control:  IV pain meds/Epidural upon request Anticipated MOD:  NSVD  Orpah Cobb, D.O. [facility], PGY1  12/24/2018, 5:03 PM

## 2018-12-24 NOTE — Progress Notes (Signed)
LABOR PROGRESS NOTE  [name] is a 33 y.o. G1P0000 at [redacted]w[redacted]d  admitted for IOL for superimposed pre-eclampsia with severe features.  Subjective: Doing well. Sitting up comfortably in bed eating lunch. Still unable to feel strong contractions.  Objective: BP 115/72   Pulse 84   Temp (!) 97.3 F (36.3 C) (Oral)   Resp 18   Ht 5\' 7"  (1.702 m)   Wt 112.8 kg   LMP 04/05/2018 (Exact Date)   SpO2 99%   BMI 38.94 kg/m  or  Vitals:   12/24/18 1031 12/24/18 1101 12/24/18 1131 12/24/18 1201  BP: (!) 146/109 (!) 135/97 139/89 115/72  Pulse: 87 90 88 84  Resp: 18 20 16 18   Temp:      TempSrc:      SpO2:      Weight:      Height:       Dilation: 4 Effacement (%): 50 Station: -3 Presentation: Vertex Exam by:: Dr. Mauri Reading FHT: baseline rate 125, min to moderate varibility, + acel, no decel Toco: irregular  Labs: Lab Results  Component Value Date   WBC 11.3 (H) 12/24/2018   HGB 12.5 12/24/2018   HCT 37.6 12/24/2018   MCV 85.6 12/24/2018   PLT 356 12/24/2018    Patient Active Problem List   Diagnosis Date Noted  . Encounter for induction of labor 12/22/2018  . Severe preeclampsia 12/22/2018  . Chronic hypertension with superimposed preeclampsia 12/16/2018  . Pap smear abnormality of cervix/human papillomavirus (HPV) positive 06/25/2018  . Proteinuria affecting pregnancy 06/24/2018  . Supervision of high risk pregnancy, antepartum, first trimester 06/20/2018  . Type 2 diabetes mellitus during pregnancy 04/14/2016  . BMI 33.0-33.9,adult 03/30/2016  . Chronic hypertension affecting pregnancy 03/30/2016    Assessment / Plan: 33 y.o. G1P0000 at [redacted]w[redacted]d here for IOL for superimposed pre-eclampsia with severe features.  Labor: Inadequate contractions on 40 units of pitocin. No cervical change since last check. Will do 4 hour pit break. Will consider AROM at next cervical check.  Fetal Wellbeing:  Cat II - Periods of decreased variability likely secondary to mag.  Intermittent moderate variability with acels and no decels. Overall reassuring. Pain Control:  IV pain meds/Epidural upon request Anticipated MOD:  NSVD  ID: GBS positive - continue PCN q4 hr  SIP with severe features: BP normal-moderately elevated at 140's/90's. Currently asymptomatic - continue to monitor q53min - continue Magnesium 2g/hr  - continue home labetalol 600mg  TID - prn labetalol and hydralazine per protocol   DM2 -on insulin gtt, most recent glucose 149 - CBGs q1hr  [employer], D.O. [facility], PGY1 12/24/2018, 12:45 PM

## 2018-12-24 NOTE — Progress Notes (Signed)
LABOR PROGRESS NOTE  [name] is a 33 y.o. G1P0000 at [redacted]w[redacted]d  admitted for  IOL for superimposed pre-eclampsia with severe features.  Subjective: Lying comfortably in bed with family at bedside. Not feeling contractions that much. Patient feeling very defeated.   Objective: BP (!) 147/100   Pulse 86   Temp (!) 97.3 F (36.3 C) (Oral)   Resp 18   Ht 5\' 7"  (1.702 m)   Wt 112.8 kg   LMP 04/05/2018 (Exact Date)   SpO2 99%   BMI 38.94 kg/m  or  Vitals:   12/24/18 0831 12/24/18 0907 12/24/18 0931 12/24/18 1001  BP: (!) 139/95 (!) 145/93 (!) 140/99 (!) 147/100  Pulse: 85 90 89 86  Resp: 16 18 18 18   Temp:   (!) 97.3 F (36.3 C)   TempSrc:   Oral   SpO2:      Weight:      Height:        Dilation: 4 Effacement (%): 50 Station: -3 Presentation: Vertex Exam by:: Dr. Mauri Reading FHT: baseline rate 130, min to moderate varibility, + acel, no decel Toco: Irregular  Labs: Lab Results  Component Value Date   WBC 11.3 (H) 12/24/2018   HGB 12.5 12/24/2018   HCT 37.6 12/24/2018   MCV 85.6 12/24/2018   PLT 356 12/24/2018    Patient Active Problem List   Diagnosis Date Noted  . Encounter for induction of labor 12/22/2018  . Severe preeclampsia 12/22/2018  . Chronic hypertension with superimposed preeclampsia 12/16/2018  . Pap smear abnormality of cervix/human papillomavirus (HPV) positive 06/25/2018  . Proteinuria affecting pregnancy 06/24/2018  . Supervision of high risk pregnancy, antepartum, first trimester 06/20/2018  . Type 2 diabetes mellitus during pregnancy 04/14/2016  . BMI 33.0-33.9,adult 03/30/2016  . Chronic hypertension affecting pregnancy 03/30/2016    Assessment / Plan: 33 y.o. G1P0000 at [redacted]w[redacted]d here for  IOL for superimposed pre-eclampsia with severe features.  Labor: No progression overnight. Discussed plan with Dr. Earlene Plater. Will continue to titrate pitocin to max dose. If no change at that time, will plan for 4 hour pitocin break.  Fetal Wellbeing:  Cat  II - continuous fetal monitoring  Pain Control:  IV pain meds/Epidural upon request Anticipated MOD:  NSVD  ID: GBS positive - continue PCN q4 hr  SIP with severe features: BP normal-moderately elevated at 140's/90's. Currently asymptomatic - continue to monitor q91min - continue Magnesium 2g/hr  - continue home labetalol 600mg  TID  - prn labetalol and hydralazine per protocol   DM2 -on insulin gtt, most recent glucose 149 - CBGs q1hr  [employer], D.O. [facility], PGY1 12/24/2018, 10:53 AM

## 2018-12-24 NOTE — Progress Notes (Signed)
OB/GYN Faculty Practice: Labor Progress Note  Subjective: Sleeping comfortably.   Objective: BP 122/74   Pulse 96   Temp (!) 97.4 F (36.3 C) (Axillary)   Resp 13   Ht 5\' 7"  (1.702 m)   Wt 112.8 kg   LMP 04/05/2018 (Exact Date)   SpO2 99%   BMI 38.94 kg/m  Gen: no distress Dilation: 4 Effacement (%): 50 Station: -3 Presentation: Vertex Exam by:: Dr Darin Engels  Assessment and Plan: 33 y.o. G1P0000 [redacted]w[redacted]d here for IOL for superimposed pre-eclampsia with severe features.  Labor: continue pitocin -- pain control: desires epidural in active labor -- PPH Risk: moderate  Fetal Well-Being: EFW 2851g (83%) at 34w5. Cephalic by exam -- Category 2 - continuous fetal monitoring  -- GBS positive - penicillin q4hr  SIP w/ Severe features -BPappropriate at 122/74 most recently, continue to monitor q28min - continue Magnesium 2g/hr  - continue home labetalol 600mg  TID  - prn labetalol and hydralazine per protocol   DM2 -on insulin gtt, most recent glucose 149 - CBGs q1hr  Allison Nieves, MD Family Medicine Resident 5:05 AM

## 2018-12-24 NOTE — Progress Notes (Signed)
OB/GYN Faculty Practice: Labor Progress Note  Subjective: Tired, frustrated. Denies headaches, blurry vision. [name] is gone but coming back.   Objective: BP 132/90   Pulse 84   Temp (!) 97.2 F (36.2 C) (Oral) Comment (Src): 97.1 axillary  Resp 18   Ht 5\' 7"  (1.702 m)   Wt 112.8 kg   LMP 04/05/2018 (Exact Date)   SpO2 99%   BMI 38.94 kg/m  Gen: tired-appearing LE: 3+ pitting edema to thighs  Dilation: (P) 4.5 Effacement (%): (P) 50 Cervical Position: Posterior Station: (P) -2 Presentation: (P) Vertex Exam by:: (P) Dr. Rhett Bannister  Assessment and Plan: 33 y.o. G1P0000 [redacted]w[redacted]d here for IOl for superimposed preeclampsia with severe features.   Labor: Approaching 48 hours since induction started. Has received FB, cytotec, and 1 24-hour round of pitocin. After 4-hour break of pitocin, restarted and continuing to titrate. AROM clear fluid. Will consider IUPC placement with next check.  -- pain control: controlled  -- PPH Risk: high   Superimposed Preeclampsia with Severe Features: BP well-controlled. Adequate UOP. Last labs over 24-hours ago. CMP and CBC wnl. UPC 11.76.  -- IV Lasix 20mg  x 1 for LE edema, BP stable  -- continue labetalol 600mg  TID -- continue Mg++ -- daily CBC, CMP  Type II DM: Elevated BG here so now on glucose stabilizer.  -- continue hourly glucose checks and gtt   Fetal Well-Being: EFW 2851g (83%). Cephalic by sutures.  -- Category I - continuous fetal monitoring  -- GBS positive - PCN -- has received 2 doses of BMZ (3/22-3/23)  Laurel S. Earlene Plater, DO OB/GYN Fellow, Faculty Practice  6:39 PM

## 2018-12-24 NOTE — Anesthesia Procedure Notes (Signed)
Epidural Patient location during procedure: OB Start time: 12/24/2018 10:43 PM End time: 12/24/2018 10:56 PM  Staffing Anesthesiologist: Lucretia Kern, MD Performed: anesthesiologist   Preanesthetic Checklist Completed: patient identified, pre-op evaluation, timeout performed, IV checked, risks and benefits discussed and monitors and equipment checked  Epidural Patient position: sitting Prep: DuraPrep Patient monitoring: heart rate, continuous pulse ox and blood pressure Approach: midline Location: L3-L4 Injection technique: LOR air  Needle:  Needle type: Tuohy  Needle gauge: 17 G Needle length: 9 cm Needle insertion depth: 7 cm Catheter type: closed end flexible Catheter size: 19 Gauge Catheter at skin depth: 12 cm Test dose: negative and 2% lidocaine with Epi 1:200 K  Assessment Events: blood not aspirated, injection not painful, no injection resistance, negative IV test and no paresthesia  Additional Notes Reason for block:procedure for pain

## 2018-12-24 NOTE — Anesthesia Preprocedure Evaluation (Signed)
Anesthesia Evaluation  Patient identified by MRN, date of birth, ID band Patient awake    Reviewed: Allergy & Precautions, H&P , NPO status , Patient's Chart, lab work & pertinent test results  History of Anesthesia Complications Negative for: history of anesthetic complications  Airway Mallampati: II  TM Distance: >3 FB Neck ROM: full    Dental no notable dental hx.    Pulmonary neg pulmonary ROS, former smoker,    Pulmonary exam normal        Cardiovascular hypertension, Normal cardiovascular exam Rhythm:regular Rate:Normal     Neuro/Psych negative neurological ROS  negative psych ROS   GI/Hepatic negative GI ROS, Neg liver ROS,   Endo/Other  diabetes, Type 2  Renal/GU negative Renal ROS  negative genitourinary   Musculoskeletal   Abdominal   Peds  Hematology negative hematology ROS (+)   Anesthesia Other Findings Chronic HTN & DM  Reproductive/Obstetrics (+) Pregnancy                             Anesthesia Physical Anesthesia Plan  ASA: III  Anesthesia Plan: Epidural   Post-op Pain Management:    Induction:   PONV Risk Score and Plan:   Airway Management Planned:   Additional Equipment:   Intra-op Plan:   Post-operative Plan:   Informed Consent: I have reviewed the patients History and Physical, chart, labs and discussed the procedure including the risks, benefits and alternatives for the proposed anesthesia with the patient or authorized representative who has indicated his/her understanding and acceptance.       Plan Discussed with:   Anesthesia Plan Comments:         Anesthesia Quick Evaluation

## 2018-12-24 NOTE — Progress Notes (Signed)
OB/GYN Faculty Practice: Labor Progress Note  Subjective: Was sleeping comfortably  Objective: BP 122/73   Pulse 87   Temp 97.8 F (36.6 C) (Oral)   Resp 12   Ht 5\' 7"  (1.702 m)   Wt 112.8 kg   LMP 04/05/2018 (Exact Date)   SpO2 99%   BMI 38.94 kg/m  Gen: no distress Dilation: 4 Effacement (%): 50 Station: -3 Presentation: Vertex Exam by:: Dr Darin Engels  Assessment and Plan: 33 y.o. G1P0000 109w2d here for IOL for superimposed pre-eclampsia with severe features.   Labor: continue pitocin 2x2 -- pain control: desires epidural in active labor -- PPH Risk: moderate  Fetal Well-Being: EFW 2851g (83%) at 34w5. Cephalic by exam -- Category 2 - continuous fetal monitoring  -- GBS positive - penicillin q4hr  SIP w/ Severe features -BP appropriate at 122/73 most recently, continue to monitor q32min - continue Magnesium 2g/hr  - continue home labetalol 600mg  TID - prn labetalol and hydralazine per protocol   DM2 -on insulin gtt, most recent glucose 96 - CBGs q1hr  Burman Nieves, MD Family Medicine Resident 12:44 AM

## 2018-12-24 NOTE — Progress Notes (Signed)
OB/GYN Faculty Practice: Labor Progress Note  Subjective: Very comfortable and a little sleepy since getting epidural. Aunt at bedside for support  Objective: BP 130/87   Pulse 82   Temp (!) 97.4 F (36.3 C) (Oral)   Resp 17   Ht 5\' 7"  (1.702 m)   Wt 112.8 kg   LMP 04/05/2018 (Exact Date)   SpO2 97%   BMI 38.94 kg/m  Gen: no distress Dilation: 5 Effacement (%): 80 Cervical Position: Posterior Station: -2 Presentation: Vertex Exam by:: Dr. Darin Engels  Assessment and Plan: 33 y.o. G1P0000 [redacted]w[redacted]d here for IOL for superimposed pre-eclampsia with severe features.  Labor: IUPC placed at last check, continue to up-titrate pitocin 2x2 -- pain control: epidural in place -- PPH Risk: increased due to prolonged pitocin use  Fetal Well-Being: [id number] (83%) at 34w5. Cephalic byexam -- Category 2 - continuous fetal monitoring  -- GBS positive - penicillin q4hr  SIP w/ Severe features -BPappropriate at 130/87 most recently, continue to monitor q59min - continue Magnesium 2g/hr  - continue home labetalol 600mg  TID  - prn labetalol and hydralazine per protocol   DM2 -on insulin gtt, most recent glucose 104 - CBGs q1hr  Burman Nieves, MD Family Medicine Resident 11:46 PM

## 2018-12-24 NOTE — Progress Notes (Signed)
Patient ID: [name], female   DOB: [birth year], 33 y.o.   MRN: [id number] Doing well, tired Not feeling pain with contractions  Vitals:   12/24/18 1901 12/24/18 1930 12/24/18 1945 12/24/18 2001  BP: 113/79 (!) 136/113 123/83 133/85  Pulse: 83 83 82 83  Resp: 16  17   Temp:   (!) 97.4 F (36.3 C)   TempSrc:   Oral   SpO2:      Weight:      Height:       FHR presently nonreactive but no decels UCs about q 3-5 min, not tracing well  Dilation: 4.5 Effacement (%): 50 Cervical Position: Posterior Station: -2 Presentation: Vertex Exam by:: Dr. Rhett Bannister  Needs IUD Discussed early epidural

## 2018-12-25 ENCOUNTER — Encounter (HOSPITAL_COMMUNITY)
Admission: AD | Disposition: A | Discharge status: Home / Self Care | Payer: Self-pay | Source: Home / Self Care | Attending: Family Medicine

## 2018-12-25 DIAGNOSIS — O324XX0 Maternal care for high head at term, not applicable or unspecified: Secondary | ICD-10-CM

## 2018-12-25 DIAGNOSIS — O2412 Pre-existing diabetes mellitus, type 2, in childbirth: Secondary | ICD-10-CM

## 2018-12-25 DIAGNOSIS — O114 Pre-existing hypertension with pre-eclampsia, complicating childbirth: Secondary | ICD-10-CM

## 2018-12-25 DIAGNOSIS — Z3A36 36 weeks gestation of pregnancy: Secondary | ICD-10-CM

## 2018-12-25 DIAGNOSIS — O10013 Pre-existing essential hypertension complicating pregnancy, third trimester: Secondary | ICD-10-CM

## 2018-12-25 DIAGNOSIS — E119 Type 2 diabetes mellitus without complications: Secondary | ICD-10-CM

## 2018-12-25 DIAGNOSIS — O1002 Pre-existing essential hypertension complicating childbirth: Secondary | ICD-10-CM

## 2018-12-25 DIAGNOSIS — O113 Pre-existing hypertension with pre-eclampsia, third trimester: Secondary | ICD-10-CM

## 2018-12-25 LAB — CBC
HCT: 34.6 % — ABNORMAL LOW (ref 36.0–46.0)
HCT: 30.7 % — ABNORMAL LOW (ref 36.0–46.0)
Hemoglobin: 11.5 g/dL — ABNORMAL LOW (ref 12.0–15.0)
Hemoglobin: 10.5 g/dL — ABNORMAL LOW (ref 12.0–15.0)
MCH: 28.8 pg (ref 26.0–34.0)
MCH: 29.9 pg (ref 26.0–34.0)
MCHC: 33.2 g/dL (ref 30.0–36.0)
MCHC: 34.2 g/dL (ref 30.0–36.0)
MCV: 86.5 fL (ref 80.0–100.0)
MCV: 87.5 fL (ref 80.0–100.0)
NRBC: 0 % (ref 0.0–0.2)
Platelets: 299 10*3/uL (ref 150–400)
Platelets: 294 10*3/uL (ref 150–400)
RBC: 4 MIL/uL (ref 3.87–5.11)
RBC: 3.51 MIL/uL — ABNORMAL LOW (ref 3.87–5.11)
RDW: 13.3 % (ref 11.5–15.5)
RDW: 13.4 % (ref 11.5–15.5)
WBC: 7.7 10*3/uL (ref 4.0–10.5)
WBC: 11.7 10*3/uL — ABNORMAL HIGH (ref 4.0–10.5)
nRBC: 0 % (ref 0.0–0.2)

## 2018-12-25 LAB — COMPREHENSIVE METABOLIC PANEL
ALT: 21 U/L (ref 0–44)
AST: 25 U/L (ref 15–41)
Albumin: 1.7 g/dL — ABNORMAL LOW (ref 3.5–5.0)
Alkaline Phosphatase: 123 U/L (ref 38–126)
Anion gap: 9 (ref 5–15)
BUN: 10 mg/dL (ref 6–20)
CO2: 23 mmol/L (ref 22–32)
Calcium: 6.8 mg/dL — ABNORMAL LOW (ref 8.9–10.3)
Chloride: 105 mmol/L (ref 98–111)
Creatinine, Ser: 0.85 mg/dL (ref 0.44–1.00)
GFR calc Af Amer: >60 mL/min (ref >60)
GFR calc non Af Amer: >60 mL/min (ref >60)
Glucose, Bld: 102 mg/dL — ABNORMAL HIGH (ref 70–99)
Potassium: 4.2 mmol/L (ref 3.5–5.1)
Sodium: 137 mmol/L (ref 135–145)
TOTAL PROTEIN: 5.7 g/dL — AB (ref 6.5–8.1)
Total Bilirubin: 0.6 mg/dL (ref 0.3–1.2)

## 2018-12-25 LAB — GLUCOSE, CAPILLARY
GLUCOSE-CAPILLARY: 87 mg/dL (ref 70–99)
Glucose-Capillary: 101 mg/dL — ABNORMAL HIGH (ref 70–99)
Glucose-Capillary: 101 mg/dL — ABNORMAL HIGH (ref 70–99)
Glucose-Capillary: 256 mg/dL — ABNORMAL HIGH (ref 70–99)
Glucose-Capillary: 78 mg/dL (ref 70–99)
Glucose-Capillary: 79 mg/dL (ref 70–99)
Glucose-Capillary: 92 mg/dL (ref 70–99)
Glucose-Capillary: 93 mg/dL (ref 70–99)

## 2018-12-25 SURGERY — Surgical Case
Anesthesia: Epidural | Site: Abdomen | Wound class: Clean Contaminated

## 2018-12-25 MED ORDER — ZOLPIDEM TARTRATE 5 MG PO TABS: 5.0000 mg | ORAL_TABLET | Freq: Every evening | ORAL | Status: DC | PRN

## 2018-12-25 MED ORDER — KETOROLAC TROMETHAMINE 30 MG/ML IJ SOLN
30.0000 mg | Freq: Four times a day (QID) | INTRAMUSCULAR | Status: AC | PRN
  Administered 2018-12-25: 30 mg via INTRAVENOUS
  Filled 2018-12-25: qty 1

## 2018-12-25 MED ORDER — OXYCODONE-ACETAMINOPHEN 5-325 MG PO TABS: 1.0000 | ORAL_TABLET | ORAL | Status: DC | PRN

## 2018-12-25 MED ORDER — OXYTOCIN 10 UNIT/ML IJ SOLN
INTRAMUSCULAR | Status: AC
  Filled 2018-12-25: qty 4

## 2018-12-25 MED ORDER — OXYTOCIN 40 UNITS IN NORMAL SALINE INFUSION - SIMPLE MED: 2.5000 [IU]/h | INTRAVENOUS | Status: AC

## 2018-12-25 MED ORDER — ENOXAPARIN SODIUM 60 MG/0.6ML ~~LOC~~ SOLN
55.0000 mg | SUBCUTANEOUS | Status: DC
  Administered 2018-12-26 – 2018-12-27 (×2): 55 mg via SUBCUTANEOUS
  Filled 2018-12-25 (×3): qty 0.6

## 2018-12-25 MED ORDER — SODIUM BICARBONATE 8.4 % IV SOLN
INTRAVENOUS | Status: AC
  Filled 2018-12-25: qty 50

## 2018-12-25 MED ORDER — ONDANSETRON HCL 4 MG/2ML IJ SOLN
INTRAMUSCULAR | Status: AC
  Filled 2018-12-25: qty 2

## 2018-12-25 MED ORDER — MORPHINE SULFATE-NACL 0.5-0.9 MG/ML-% IV SOSY
PREFILLED_SYRINGE | INTRAVENOUS | Status: DC | PRN
  Administered 2018-12-25: 3 mg via EPIDURAL

## 2018-12-25 MED ORDER — LACTATED RINGERS IV SOLN
INTRAVENOUS | Status: DC
  Administered 2018-12-25 (×2): via INTRAVENOUS

## 2018-12-25 MED ORDER — COCONUT OIL OIL: 1.0000 "application " | TOPICAL_OIL | Status: DC | PRN

## 2018-12-25 MED ORDER — MENTHOL 3 MG MT LOZG: 1.0000 | LOZENGE | OROMUCOSAL | Status: DC | PRN

## 2018-12-25 MED ORDER — SODIUM CHLORIDE 0.9 % IV SOLN
INTRAVENOUS | Status: DC | PRN
  Administered 2018-12-25: 08:00:00 via INTRAVENOUS

## 2018-12-25 MED ORDER — OXYTOCIN 10 UNIT/ML IJ SOLN
INTRAVENOUS | Status: DC | PRN
  Administered 2018-12-25: 40 [IU] via INTRAVENOUS

## 2018-12-25 MED ORDER — LIDOCAINE-EPINEPHRINE (PF) 2 %-1:200000 IJ SOLN
INTRAMUSCULAR | Status: AC
  Filled 2018-12-25: qty 20

## 2018-12-25 MED ORDER — SIMETHICONE 80 MG PO CHEW
80.0000 mg | CHEWABLE_TABLET | ORAL | Status: DC
  Administered 2018-12-25 – 2018-12-27 (×3): 80 mg via ORAL
  Filled 2018-12-25 (×3): qty 1

## 2018-12-25 MED ORDER — DIPHENHYDRAMINE HCL 25 MG PO CAPS: 25.0000 mg | ORAL_CAPSULE | Freq: Four times a day (QID) | ORAL | Status: DC | PRN

## 2018-12-25 MED ORDER — SODIUM CHLORIDE 0.9% FLUSH: 3.0000 mL | INTRAVENOUS | Status: DC | PRN

## 2018-12-25 MED ORDER — METOCLOPRAMIDE HCL 5 MG/ML IJ SOLN
INTRAMUSCULAR | Status: AC
  Filled 2018-12-25: qty 2

## 2018-12-25 MED ORDER — LACTATED RINGERS IV SOLN
INTRAVENOUS | Status: DC | PRN
  Administered 2018-12-25: 07:00:00 via INTRAVENOUS

## 2018-12-25 MED ORDER — MORPHINE SULFATE (PF) 0.5 MG/ML IJ SOLN
INTRAMUSCULAR | Status: AC
  Filled 2018-12-25: qty 10

## 2018-12-25 MED ORDER — WITCH HAZEL-GLYCERIN EX PADS: 1.0000 "application " | MEDICATED_PAD | CUTANEOUS | Status: DC | PRN

## 2018-12-25 MED ORDER — ONDANSETRON HCL 4 MG/2ML IJ SOLN
INTRAMUSCULAR | Status: DC | PRN
  Administered 2018-12-25: 4 mg via INTRAVENOUS

## 2018-12-25 MED ORDER — ONDANSETRON HCL 4 MG/2ML IJ SOLN
4.0000 mg | Freq: Three times a day (TID) | INTRAMUSCULAR | Status: DC | PRN
  Administered 2018-12-25 – 2018-12-26 (×3): 4 mg via INTRAVENOUS
  Filled 2018-12-25 (×3): qty 2

## 2018-12-25 MED ORDER — MAGNESIUM SULFATE 40 G IN LACTATED RINGERS - SIMPLE
2.0000 g/h | INTRAVENOUS | Status: DC
  Administered 2018-12-25: 2 g/h via INTRAVENOUS
  Filled 2018-12-25: qty 500

## 2018-12-25 MED ORDER — PHENYLEPHRINE 40 MCG/ML (10ML) SYRINGE FOR IV PUSH (FOR BLOOD PRESSURE SUPPORT)
PREFILLED_SYRINGE | INTRAVENOUS | Status: AC
  Filled 2018-12-25: qty 10

## 2018-12-25 MED ORDER — SENNOSIDES-DOCUSATE SODIUM 8.6-50 MG PO TABS
2.0000 | ORAL_TABLET | ORAL | Status: DC
  Administered 2018-12-25 – 2018-12-27 (×3): 2 via ORAL
  Filled 2018-12-25 (×3): qty 2

## 2018-12-25 MED ORDER — TETANUS-DIPHTH-ACELL PERTUSSIS 5-2.5-18.5 LF-MCG/0.5 IM SUSP: 0.5000 mL | Freq: Once | INTRAMUSCULAR | Status: DC

## 2018-12-25 MED ORDER — NIFEDIPINE ER OSMOTIC RELEASE 30 MG PO TB24
30.0000 mg | ORAL_TABLET | Freq: Two times a day (BID) | ORAL | Status: DC
  Administered 2018-12-25: 30 mg via ORAL
  Filled 2018-12-25 (×3): qty 1

## 2018-12-25 MED ORDER — KETOROLAC TROMETHAMINE 30 MG/ML IJ SOLN: 30.0000 mg | Freq: Four times a day (QID) | INTRAMUSCULAR | Status: AC | PRN

## 2018-12-25 MED ORDER — NALOXONE HCL 0.4 MG/ML IJ SOLN: 0.4000 mg | INTRAMUSCULAR | Status: DC | PRN

## 2018-12-25 MED ORDER — CEFAZOLIN SODIUM-DEXTROSE 2-4 GM/100ML-% IV SOLN
2.0000 g | Freq: Once | INTRAVENOUS | Status: AC
  Administered 2018-12-25: 2 g via INTRAVENOUS
  Filled 2018-12-25: qty 100

## 2018-12-25 MED ORDER — PHENYLEPHRINE HCL 10 MG/ML IJ SOLN
INTRAMUSCULAR | Status: DC | PRN
  Administered 2018-12-25: 80 ug via INTRAVENOUS
  Administered 2018-12-25: 120 ug via INTRAVENOUS
  Administered 2018-12-25 (×2): 80 ug via INTRAVENOUS

## 2018-12-25 MED ORDER — INSULIN ASPART 100 UNIT/ML ~~LOC~~ SOLN
0.0000 [IU] | Freq: Three times a day (TID) | SUBCUTANEOUS | Status: DC
  Administered 2018-12-25: 8 [IU] via SUBCUTANEOUS

## 2018-12-25 MED ORDER — PRENATAL MULTIVITAMIN CH
1.0000 | ORAL_TABLET | Freq: Every day | ORAL | Status: DC
  Administered 2018-12-26 – 2018-12-27 (×2): 1 via ORAL
  Filled 2018-12-25 (×2): qty 1

## 2018-12-25 MED ORDER — SIMETHICONE 80 MG PO CHEW
80.0000 mg | CHEWABLE_TABLET | Freq: Three times a day (TID) | ORAL | Status: DC
  Administered 2018-12-26 – 2018-12-28 (×7): 80 mg via ORAL
  Filled 2018-12-25 (×7): qty 1

## 2018-12-25 MED ORDER — METFORMIN HCL 500 MG PO TABS
500.0000 mg | ORAL_TABLET | Freq: Two times a day (BID) | ORAL | Status: DC
  Administered 2018-12-25 – 2018-12-27 (×5): 500 mg via ORAL
  Filled 2018-12-25 (×6): qty 1

## 2018-12-25 MED ORDER — OXYTOCIN 40 UNITS IN NORMAL SALINE INFUSION - SIMPLE MED
INTRAVENOUS | Status: AC
  Filled 2018-12-25: qty 1000

## 2018-12-25 MED ORDER — METOCLOPRAMIDE HCL 5 MG/ML IJ SOLN
INTRAMUSCULAR | Status: DC | PRN
  Administered 2018-12-25 (×3): 5 mg via INTRAVENOUS

## 2018-12-25 MED ORDER — LACTATED RINGERS IV SOLN: INTRAVENOUS | Status: DC

## 2018-12-25 MED ORDER — SIMETHICONE 80 MG PO CHEW
80.0000 mg | CHEWABLE_TABLET | ORAL | Status: DC | PRN
  Filled 2018-12-25: qty 1

## 2018-12-25 MED ORDER — DIBUCAINE 1 % RE OINT: 1.0000 "application " | TOPICAL_OINTMENT | RECTAL | Status: DC | PRN

## 2018-12-25 MED ORDER — SODIUM CHLORIDE 0.9 % IV SOLN
500.0000 mg | Freq: Once | INTRAVENOUS | Status: AC
  Administered 2018-12-25: 500 mg via INTRAVENOUS
  Filled 2018-12-25: qty 500

## 2018-12-25 SURGICAL SUPPLY — 33 items
BENZOIN TINCTURE PRP APPL 2/3 (GAUZE/BANDAGES/DRESSINGS) ×3 IMPLANT
CHLORAPREP W/TINT 26ML (MISCELLANEOUS) ×3 IMPLANT
CLAMP CORD UMBIL (MISCELLANEOUS) IMPLANT
CLOSURE STERI STRIP 1/2 X4 (GAUZE/BANDAGES/DRESSINGS) ×3 IMPLANT
CLOTH BEACON ORANGE TIMEOUT ST (SAFETY) ×3 IMPLANT
DRSG OPSITE 6X11 MED (GAUZE/BANDAGES/DRESSINGS) ×3 IMPLANT
DRSG OPSITE POSTOP 4X10 (GAUZE/BANDAGES/DRESSINGS) ×3 IMPLANT
ELECT REM PT RETURN 9FT ADLT (ELECTROSURGICAL) ×3
ELECTRODE REM PT RTRN 9FT ADLT (ELECTROSURGICAL) ×1 IMPLANT
EXTRACTOR VACUUM M CUP 4 TUBE (SUCTIONS) IMPLANT
EXTRACTOR VACUUM M CUP 4' TUBE (SUCTIONS)
GLOVE BIOGEL PI IND STRL 7.0 (GLOVE) ×2 IMPLANT
GLOVE BIOGEL PI IND STRL 7.5 (GLOVE) ×2 IMPLANT
GLOVE BIOGEL PI INDICATOR 7.0 (GLOVE) ×4
GLOVE BIOGEL PI INDICATOR 7.5 (GLOVE) ×4
GLOVE ECLIPSE 7.5 STRL STRAW (GLOVE) ×3 IMPLANT
GOWN STRL REUS W/TWL LRG LVL3 (GOWN DISPOSABLE) ×9 IMPLANT
KIT ABG SYR 3ML LUER SLIP (SYRINGE) IMPLANT
NEEDLE HYPO 25X5/8 SAFETYGLIDE (NEEDLE) IMPLANT
NS IRRIG 1000ML POUR BTL (IV SOLUTION) ×3 IMPLANT
PACK C SECTION WH (CUSTOM PROCEDURE TRAY) ×3 IMPLANT
PAD OB MATERNITY 4.3X12.25 (PERSONAL CARE ITEMS) ×3 IMPLANT
PENCIL SMOKE EVAC W/HOLSTER (ELECTROSURGICAL) ×3 IMPLANT
RTRCTR C-SECT PINK 25CM LRG (MISCELLANEOUS) ×3 IMPLANT
STRIP CLOSURE SKIN 1/2X4 (GAUZE/BANDAGES/DRESSINGS) ×2 IMPLANT
SUT VIC AB 0 CTX 36 (SUTURE) ×6
SUT VIC AB 0 CTX36XBRD ANBCTRL (SUTURE) ×3 IMPLANT
SUT VIC AB 2-0 CT1 27 (SUTURE) ×2
SUT VIC AB 2-0 CT1 TAPERPNT 27 (SUTURE) ×1 IMPLANT
SUT VIC AB 4-0 KS 27 (SUTURE) ×3 IMPLANT
TOWEL OR 17X24 6PK STRL BLUE (TOWEL DISPOSABLE) ×3 IMPLANT
TRAY FOLEY W/BAG SLVR 14FR LF (SET/KITS/TRAYS/PACK) ×3 IMPLANT
WATER STERILE IRR 1000ML POUR (IV SOLUTION) ×3 IMPLANT

## 2018-12-25 NOTE — Consult Note (Signed)
Neonatology Note:   Attendance at C-section:    I was asked by Dr. Adrian Blackwater to attend this C/S at 36 3/7 weeks due to FTP. The mother is a G1, GBS positive with aIAP with good prenatal care complicated by IOL for pre-eclampsia, T2DM on insulin, CHTN on Labetalol and procardia, and obesity. ROM 13 hours before delivery, fluid clear. Infant vigorous with good spontaneous cry and tone. +60 sec DCC. Bulb suctioning.  Observed concerns for central cyanosis persisting by ~63min so Sao2 placed.  Initially in mid 70s, but with further infant stim with crying, Sao2 gradually came up to low 90s over a couple minutes.  No WOB. Lungs clear to ausc in DR. Good perfusion.  Apg 8//9.  To CN to care of Pediatrician.  Dineen Kid Leary Roca, MD Neonatologist 12/25/2018, 8:08 AM

## 2018-12-25 NOTE — Anesthesia Postprocedure Evaluation (Signed)
Anesthesia Post Note  Patient: [name]  Procedure(s) Performed: CESAREAN SECTION (N/A Abdomen)     Patient location during evaluation: PACU Anesthesia Type: Epidural Level of consciousness: oriented and awake and alert Pain management: pain level controlled Vital Signs Assessment: post-procedure vital signs reviewed and stable Respiratory status: spontaneous breathing, respiratory function stable and patient connected to nasal cannula oxygen Cardiovascular status: blood pressure returned to baseline and stable Postop Assessment: no headache, no backache and no apparent nausea or vomiting Anesthetic complications: no    Last Vitals:  Vitals:   12/25/18 1102 12/25/18 1125  BP: 100/78 124/83  Pulse:  78  Resp: 15 16  Temp:  36.4 C  SpO2:  100%    Last Pain:  Vitals:   12/25/18 1125  TempSrc: Oral  PainSc: 5    Pain Goal: Patients Stated Pain Goal: 5 (12/25/18 1125)                 Tae Vonada S

## 2018-12-25 NOTE — Plan of Care (Signed)
Pt resting comfortably in bed after epidural placement.  IUPC placed by provider and tracing well, will continue to increase oxytocin infusion as appropriate. SCDs placed, frequent position changes to prevent skin breakdown and promote fetal descent.  Will continue to monitor and communicate with team as needed.  Problem: Education: Goal: Knowledge of Childbirth will improve Outcome: Progressing Goal: Ability to make informed decisions regarding treatment and plan of care will improve Outcome: Progressing Goal: Ability to state and carry out methods to decrease the pain will improve Outcome: Progressing Goal: Individualized Educational Video(s) Outcome: Progressing   Problem: Metabolic: Goal: Ability to maintain appropriate glucose levels will improve Outcome: Progressing   Problem: Skin Integrity: Goal: Risk for impaired skin integrity will decrease Outcome: Progressing   Problem: Tissue Perfusion: Goal: Adequacy of tissue perfusion will improve Outcome: Progressing

## 2018-12-25 NOTE — Progress Notes (Signed)
Inpatient Diabetes Program Recommendations  AACE/ADA: New Consensus Statement on Inpatient Glycemic Control (2015)  Target Ranges:  Prepandial:   less than 140 mg/dL      Peak postprandial:   less than 180 mg/dL (1-2 hours)      Critically ill patients:  140 - 180 mg/dL   Lab Results  Component Value Date   GLUCAP 101 (H) 12/25/2018   HGBA1C 7.3 (H) 12/22/2018    Review of Glycemic Control Results for ALEYIA, LINEBERRY (MRN [id number]) as of 12/25/2018 16:19  Ref. Range 12/25/2018 05:58 12/25/2018 07:03 12/25/2018 08:42  Glucose-Capillary Latest Ref Range: 70 - 99 mg/dL 709 (H) 78 628 (H)   Diabetes history: Type 2 DM Outpatient Diabetes medications: Lantus 20 units BID, Humalog 07/11/14, Metformin 500 mg TID Current orders for Inpatient glycemic control: none  Inpatient Diabetes Program Recommendations:    Glucostabilizer stopped now that patient has delivered.   Would recommend adding Novolog 0-9 units TID & HS under the glycemic control order set.   Thanks, Lujean Rave, MSN, RNC-OB Diabetes Coordinator [phone] (8a-5p)

## 2018-12-25 NOTE — Discharge Summary (Signed)
Postpartum Discharge Summary     Patient Name: [name] DOB: 04-30-1986 MRN: [id number]  Date of admission: 12/22/2018 Delivering Provider: Levie Heritage   Date of discharge: 12/28/2018  Admitting diagnosis: 36wks, headache Intrauterine pregnancy: [redacted]w[redacted]d     Secondary diagnosis:  Principal Problem:   Severe preeclampsia Active Problems:   Chronic hypertension affecting pregnancy   Type 2 diabetes mellitus during pregnancy   Proteinuria affecting pregnancy   Chronic hypertension with superimposed preeclampsia   Encounter for induction of labor  Additional problems: none     Discharge diagnosis: Term Pregnancy Delivered, CHTN with superimposed preeclampsia and Type 2 DM                                                                                                Post partum procedures:magnesium sulfate for seizure prophylaxis  Augmentation: AROM, Pitocin, Cytotec and Foley Balloon  Complications: None  Hospital course:  Induction of Labor With Cesarean Section  33 y.o. yo G1P0000 at [redacted]w[redacted]d was admitted to the hospital 12/22/2018 for induction of labor. Patient had a labor course significant for prolonged labor, receiving multiple doses of cytotec, a foley balloon, and prolonged course of pitocin. An IUPC was placed, showing adequate contractions. The patient went for cesarean section due to Arrest of Dilation, and delivered a Viable infant,12/25/2018  Membrane Rupture Time/Date: 6:30 PM ,12/24/2018   Details of operation can be found in separate operative Note.  Patient had an uncomplicated postpartum course. She received magnesium sulfate for seizure prophylaxis for 24 hours post delivery. She is ambulating, tolerating a regular diet, passing flatus, and urinating well.  Patient is discharged home in stable condition on 12/28/18.                                    Magnesium Sulfate recieved: Yes BMZ received: Yes  Physical exam  Vitals:   12/27/18 2001 12/27/18 2035  12/27/18 2320 12/28/18 0419  BP: (!) 160/87 (!) 154/87 (!) 150/87 (!) 150/95  Pulse: (!) 114 (!) 111 (!) 109 (!) 111  Resp: 17  18 18   Temp: 99 F (37.2 C)  98.4 F (36.9 C) 98.4 F (36.9 C)  TempSrc: Oral   Oral  SpO2: 99%  99% 99%  Weight:      Height:       General: alert, cooperative and no distress Lochia: appropriate Uterine Fundus: firm Incision: Dressing is clean, dry, and intact DVT Evaluation: No evidence of DVT seen on physical exam. Labs: Lab Results  Component Value Date   WBC 12.8 (H) 12/26/2018   HGB 10.3 (L) 12/26/2018   HCT 29.6 (L) 12/26/2018   MCV 85.5 12/26/2018   PLT 295 12/26/2018   CMP Latest Ref Rng & Units 12/25/2018  Glucose 70 - 99 mg/dL 166(A)  BUN 6 - 20 mg/dL 10  Creatinine 6.30 - 1.60 mg/dL 1.09  Sodium 323 - 557 mmol/L 137  Potassium 3.5 - 5.1 mmol/L 4.2  Chloride 98 - 111 mmol/L 105  CO2 22 - 32 mmol/L  23  Calcium 8.9 - 10.3 mg/dL 9.3(O)  Total Protein 6.5 - 8.1 g/dL 6.7(T)  Total Bilirubin 0.3 - 1.2 mg/dL 0.6  Alkaline Phos 38 - 126 U/L 123  AST 15 - 41 U/L 25  ALT 0 - 44 U/L 21    Discharge instruction: per After Visit Summary and "Baby and Me Booklet".  After visit meds:  Allergies as of 12/28/2018      Reactions   Almond (diagnostic) Hives   Bee Venom Hives      Medication List    STOP taking these medications   aspirin EC 81 MG tablet   cyclobenzaprine 10 MG tablet Commonly known as:  FLEXERIL   Insulin Glargine 100 UNIT/ML Solostar Pen Commonly known as:  Lantus SoloStar Replaced by:  insulin glargine 100 UNIT/ML injection   Insulin Lispro 200 UNIT/ML Sopn Commonly known as:  HumaLOG KwikPen   labetalol 300 MG tablet Commonly known as:  NORMODYNE   metoCLOPramide 10 MG tablet Commonly known as:  REGLAN   omeprazole 10 MG capsule Commonly known as:  PRILOSEC   Pen Needles 31G X 5 MM Misc     TAKE these medications   ibuprofen 600 MG tablet Commonly known as:  ADVIL,MOTRIN Take 1 tablet (600 mg total)  by mouth every 6 (six) hours as needed for mild pain.   insulin glargine 100 UNIT/ML injection Commonly known as:  LANTUS Inject 0.25 mLs (25 Units total) into the skin 2 (two) times daily. Replaces:  Insulin Glargine 100 UNIT/ML Solostar Pen   metFORMIN 500 MG tablet Commonly known as:  GLUCOPHAGE Take 1 tablet (500 mg total) by mouth 2 (two) times daily with a meal. What changed:    medication strength  when to take this   NIFEdipine 30 MG 24 hr tablet Commonly known as:  PROCARDIA-XL/NIFEDICAL-XL Take 1 tablet (30 mg total) by mouth 2 (two) times daily.   oxyCODONE-acetaminophen 5-325 MG tablet Commonly known as:  PERCOCET/ROXICET Take 1 tablet by mouth every 4 (four) hours as needed for moderate pain.   PRENATAL VITAMINS PO Take 1 tablet by mouth daily.       Diet: carb modified diet  Activity: Advance as tolerated. Pelvic rest for 6 weeks.   Outpatient follow up: 1 week for BP and incision check Follow up Appt:No future appointments. Follow up Visit:     Newborn Data: Live born female  Birth Weight:   APGAR: 8, 9  Newborn Delivery   Birth date/time:  12/25/2018 07:40:00 Delivery type:  C-Section, Low Vertical C-section categorization:  Primary     Baby Feeding: Bottle Disposition:home with mother   12/28/2018 Catalina Antigua, MD

## 2018-12-25 NOTE — Progress Notes (Signed)
This note also relates to the following rows which could not be included: Pulse Rate - Cannot attach notes to unvalidated device data ECG Heart Rate - Cannot attach notes to unvalidated device data Resp - Cannot attach notes to unvalidated device data SpO2 - Cannot attach notes to unvalidated device data  Notified Dr Chaney Malling of pt temp and temp history on labor and delivery and interventions done for pt in PACU (bear hugger and bear hugger blanket) to increase pt temp.  No new orders rec. Dr Chaney Malling states he is OK with pt temp and feels it is ok to move patient to room once progressive leg movement is present.

## 2018-12-25 NOTE — Transfer of Care (Signed)
Immediate Anesthesia Transfer of Care Note  Patient: [name]  Procedure(s) Performed: CESAREAN SECTION (N/A Abdomen)  Patient Location: PACU  Anesthesia Type:Epidural  Level of Consciousness: awake  Airway & Oxygen Therapy: Patient Spontanous Breathing  Post-op Assessment: Report given to RN  Post vital signs: Reviewed and stable  Last Vitals:  Vitals Value Taken Time  BP    Temp    Pulse    Resp    SpO2      Last Pain:  Vitals:   12/25/18 0630  TempSrc: Oral  PainSc:       Patients Stated Pain Goal: 4 (12/23/18 0830)  Complications: No apparent anesthesia complications

## 2018-12-25 NOTE — Progress Notes (Addendum)
Patient ID: [name], female   DOB: 09-23-1986, 33 y.o.   MRN: [id number] Comfortable with epidural  Vitals:   12/25/18 0201 12/25/18 0231 12/25/18 0301 12/25/18 0330  BP: 119/79 118/78 (!) 136/93 (!) 134/94  Pulse: 79 78 79 79  Resp:   16   Temp:   (!) 97.3 F (36.3 C)   TempSrc:      SpO2:      Weight:      Height:       Capillary Glucose values within normal limits CBG (last 3)  Recent Labs    12/25/18 0030 12/25/18 0138 12/25/18 0240  GLUCAP 93 92 79   FHR nonreactive in 110-115s Small accel with scalp stimulation (5-10bts) UCs adequate 180-212mvu/10min for the past hour.  Off and on since 0230 but would have gaps of time where UCs spaced out.   Dilation: 5 Effacement (%): 60 Cervical Position: Middle Station: -3, -2 Presentation: Vertex Exam by:: Mayford Knife CNM  Will update Dr Adrian Blackwater Will observe another hour

## 2018-12-25 NOTE — Progress Notes (Signed)
Patient ID: [name], female   DOB: 1986/08/26, 33 y.o.   MRN: [id number]  Patient rechecked - still 5/80/-2 despite adequate contractions. Mild to moderate variability, no decels. Patient frustrated with lack of progress. Discussed c/s - pt amenable.  The risks of cesarean section discussed with the patient included but were not limited to: bleeding which may require transfusion or reoperation; infection which may require antibiotics; injury to bowel, bladder, ureters or other surrounding organs; injury to the fetus; need for additional procedures including hysterectomy in the event of a life-threatening hemorrhage; placental abnormalities wth subsequent pregnancies, incisional problems, thromboembolic phenomenon and other postoperative/anesthesia complications. The patient concurred with the proposed plan, giving informed written consent for the procedure.   Patient has been NPO since midnight, she will remain NPO for procedure. Anesthesia and OR aware.  Preoperative prophylactic Ancef and azithromycin ordered on call to the OR.  To OR when ready.  Levie Heritage, DO 12/25/2018 6:10 AM

## 2018-12-25 NOTE — Progress Notes (Signed)
Patient ID: [name], female   DOB: 1986/04/21, 33 y.o.   MRN: [id number] Comfortable with epidural Vitals:   12/25/18 0031 12/25/18 0101 12/25/18 0131 12/25/18 0201  BP: 117/81 114/72 110/73 119/79  Pulse: 78 78 80 79  Resp: 16     Temp: (!) 97.4 F (36.3 C)     TempSrc: Oral     SpO2:      Weight:      Height:       FHR stable in 115 range Average variability No accels that meet 15x15 criteria  UCs strength improved, but need to be closer IUPC > 180 MVU/82min  Dilation: 5 Effacement (%): 80 Cervical Position: Posterior Station: -2 Presentation: Vertex Exam by:: Dr. Darin Engels  Will continue plan

## 2018-12-25 NOTE — Op Note (Signed)
[name] PROCEDURE DATE: 12/25/2018  PREOPERATIVE DIAGNOSIS: Intrauterine pregnancy at  [redacted]w[redacted]d weeks gestation; failure to progress: arrest of dilation  POSTOPERATIVE DIAGNOSIS: The same  PROCEDURE: Primary Low Transverse Cesarean Section  SURGEON:  Dr. Candelaria Celeste  ASSISTANT: Mamie Levers, RNFA  INDICATIONS: [name] is a 33 y.o. G1P0000 at [redacted]w[redacted]d scheduled for cesarean section secondary to failure to progress: arrest of dilation.  The risks of cesarean section discussed with the patient included but were not limited to: bleeding which may require transfusion or reoperation; infection which may require antibiotics; injury to bowel, bladder, ureters or other surrounding organs; injury to the fetus; need for additional procedures including hysterectomy in the event of a life-threatening hemorrhage; placental abnormalities wth subsequent pregnancies, incisional problems, thromboembolic phenomenon and other postoperative/anesthesia complications. The patient concurred with the proposed plan, giving informed written consent for the procedure.    FINDINGS:  Viable female infant in vertex, OP presentation.  Apgars 8 and 9, weight pending.  Clear amniotic fluid.  Intact placenta, three vessel cord.  Normal uterus, fallopian tubes and ovaries bilaterally.  ANESTHESIA:    Spinal INTRAVENOUS FLUIDS: 800 ml ESTIMATED BLOOD LOSS: 536 ml URINE OUTPUT:  75 ml SPECIMENS: Placenta sent to pathology COMPLICATIONS: None immediate  PROCEDURE IN DETAIL:  The patient received intravenous antibiotics and had sequential compression devices applied to her lower extremities while in the preoperative area.  She was then taken to the operating room where epidural anesthesia was dosed up to surgical level and was found to be adequate. She was then placed in a dorsal supine position with a leftward tilt, and prepped and draped in a sterile manner.  A foley catheter had been placed into her bladder and attached to  constant gravity, which drained clear fluid throughout.  After an adequate timeout was performed, a Pfannenstiel skin incision was made with scalpel and carried through to the underlying layer of fascia. The fascia was incised in the midline and this incision was extended bilaterally using the Mayo scissors. Kocher clamps were applied to the superior aspect of the fascial incision and the underlying rectus muscles were dissected off bluntly. A similar process was carried out on the inferior aspect of the facial incision. The rectus muscles were separated in the midline bluntly and the peritoneum was entered bluntly. An Alexis retractor was placed to aid in visualization of the uterus.  Attention was turned to the lower uterine segment where a transverse hysterotomy was made with a scalpel and extended bilaterally bluntly. The infant was successfully delivered, and cord was clamped and cut and infant was handed over to awaiting neonatology team. Uterine massage was then administered and the placenta delivered intact with three-vessel cord. The hysterotomy had extended into a branch of the left uterine artery. This was clamped with ring forceps. The uterus was then cleared of clot and debris.  The hysterotomy was closed with 0 Vicryl in a running locked fashion, and an imbricating layer was also placed with a 0 Vicryl. Overall, excellent hemostasis was noted. The abdomen and the pelvis were cleared of all clot and debris and the Jon Gills was removed. Hemostasis was confirmed on all surfaces.  The peritoneum was reapproximated using 2-0 vicryl running stitches. The fascia was then closed using 0 Vicryl in a running fashion. The subcutaneous layer was reapproximated with plain gut and the skin was closed with 4-0 vicryl. The patient tolerated the procedure well. Sponge, lap, instrument and needle counts were correct x 2. She was taken to the  recovery room in stable condition.    Levie Heritage, DO 12/25/2018 8:42  AM

## 2018-12-25 NOTE — Progress Notes (Signed)
This note also relates to the following rows which could not be included: SpO2 - Cannot attach notes to unvalidated device data  Pt is asymptomatic regarding body temp.  Pt is not shivering

## 2018-12-26 ENCOUNTER — Encounter: Payer: BLUE CROSS/BLUE SHIELD | Admitting: Family Medicine

## 2018-12-26 LAB — CBC
HCT: 29.6 % — ABNORMAL LOW (ref 36.0–46.0)
Hemoglobin: 10.3 g/dL — ABNORMAL LOW (ref 12.0–15.0)
MCH: 29.8 pg (ref 26.0–34.0)
MCHC: 34.8 g/dL (ref 30.0–36.0)
MCV: 85.5 fL (ref 80.0–100.0)
Platelets: 295 10*3/uL (ref 150–400)
RBC: 3.46 MIL/uL — ABNORMAL LOW (ref 3.87–5.11)
RDW: 13.1 % (ref 11.5–15.5)
WBC: 12.8 10*3/uL — ABNORMAL HIGH (ref 4.0–10.5)
nRBC: 0 % (ref 0.0–0.2)

## 2018-12-26 LAB — GLUCOSE, CAPILLARY
GLUCOSE-CAPILLARY: 111 mg/dL — AB (ref 70–99)
Glucose-Capillary: 151 mg/dL — ABNORMAL HIGH (ref 70–99)
Glucose-Capillary: 152 mg/dL — ABNORMAL HIGH (ref 70–99)

## 2018-12-26 MED ORDER — INSULIN GLARGINE 100 UNIT/ML ~~LOC~~ SOLN: 25.0000 [IU] | Freq: Two times a day (BID) | SUBCUTANEOUS | Status: DC

## 2018-12-26 MED ORDER — IBUPROFEN 600 MG PO TABS
600.0000 mg | ORAL_TABLET | Freq: Four times a day (QID) | ORAL | Status: DC | PRN
  Administered 2018-12-26 – 2018-12-28 (×3): 600 mg via ORAL
  Filled 2018-12-26 (×3): qty 1

## 2018-12-26 MED ORDER — PANTOPRAZOLE SODIUM 40 MG PO TBEC
40.0000 mg | DELAYED_RELEASE_TABLET | Freq: Every day | ORAL | Status: DC
  Administered 2018-12-26 – 2018-12-28 (×3): 40 mg via ORAL
  Filled 2018-12-26 (×5): qty 1

## 2018-12-26 MED ORDER — NIFEDIPINE ER OSMOTIC RELEASE 30 MG PO TB24
30.0000 mg | ORAL_TABLET | Freq: Every day | ORAL | Status: DC
  Administered 2018-12-26: 30 mg via ORAL
  Filled 2018-12-26: qty 1

## 2018-12-26 MED ORDER — INSULIN GLARGINE 100 UNIT/ML ~~LOC~~ SOLN
25.0000 [IU] | Freq: Two times a day (BID) | SUBCUTANEOUS | Status: DC
  Administered 2018-12-26 – 2018-12-28 (×5): 25 [IU] via SUBCUTANEOUS
  Filled 2018-12-26 (×6): qty 0.25

## 2018-12-26 NOTE — Lactation Note (Addendum)
This note was copied from a baby's chart. Lactation Consultation Note  Patient Name: [name] [facility] Date: 12/26/2018 Reason for consult: Initial assessment;Late-preterm 34-36.6wks;1st time breastfeeding   P1, Baby 25 hours old.  [redacted]w[redacted]d.  Mother really wants to breastfeed.  Provided education regarding late preterm infants. Mother's has flat nipples.  Taught mother how to prepump to help evert nipple and hand express.  Mother does not have a bra with her to wear shells. Attempted latching with and without #20NS.  Prefilled #20NS with formula. Baby latched and although sleepy was able to sustain latch for approx 12 min. Intermittent swallows observed.  Mother was excited and really wants to breastfeed but understands about LPI and will pump in addition. Encouraged mother to call insurance for personal DEBP and discussed [name] gifts.  Reviewed LPI feeding plan, cleaning and storage. Tried both #24 & #27 flanges and #24 seems to fit the best.  Mother pumped some drops.  Discussed spoon feeding.  Feed on demand approximately 8-12 times per day at least q 3 hours.   Mom made aware of O/P services, breastfeeding support groups, community resources, and our phone # for post-discharge questions.       Maternal Data Has patient been taught Hand Expression?: Yes Does the patient have breastfeeding experience prior to this delivery?: No  Feeding Feeding Type: Breast Fed Nipple Type: Slow - flow  LATCH Score Latch: Repeated attempts needed to sustain latch, nipple held in mouth throughout feeding, stimulation needed to elicit sucking reflex.  Audible Swallowing: A few with stimulation  Type of Nipple: Flat  Comfort (Breast/Nipple): Soft / non-tender  Hold (Positioning): Assistance needed to correctly position infant at breast and maintain latch.  LATCH Score: 6  Interventions Interventions: Breast feeding basics reviewed;Assisted with latch;Hand express;Pre-pump if  needed;Adjust position;Support pillows;Hand pump;DEBP  Lactation Tools Discussed/Used Tools: Nipple Shields Nipple shield size: 20 WIC Program: No Pump Review: Setup, frequency, and cleaning;Milk Storage Initiated by:: RN(IBCLC Reviewed) Date initiated:: 12/25/18   Consult Status Consult Status: Follow-up Date: 12/27/18 Follow-up type: In-patient    Dahlia Byes [facility] 12/26/2018, 9:18 AM

## 2018-12-26 NOTE — Progress Notes (Signed)
Subjective: Postpartum Day 1: Cesarean Delivery Patient reports incisional pain and tolerating PO.    Objective: Vital signs in last 24 hours: Temp:  [97.3 F (36.3 C)-97.7 F (36.5 C)] 97.3 F (36.3 C) (03/26 0333) Pulse Rate:  [70-91] 91 (03/26 0333) Resp:  [0-26] 18 (03/26 0333) BP: (76-147)/(42-102) 143/94 (03/26 0333) SpO2:  [96 %-100 %] 100 % (03/26 0333)  Physical Exam:  General: alert, cooperative and no distress Lochia: appropriate Uterine Fundus: firm Incision: healing well, no significant drainage, no dehiscence DVT Evaluation: No evidence of DVT seen on physical exam.  Recent Labs    12/25/18 1247 12/26/18 0443  HGB 10.5* 10.3*  HCT 30.7* 29.6*    Assessment/Plan: Status post Cesarean section. Doing well postoperatively.  Continue current care Stop magnesium this am Continue procardia xl 30 mg BID Continue metformin 500 BID + sliding scale novolog.  Lazaro Arms 12/26/2018, 7:43 AM

## 2018-12-27 ENCOUNTER — Encounter (HOSPITAL_COMMUNITY): Payer: Self-pay | Admitting: Family Medicine

## 2018-12-27 ENCOUNTER — Other Ambulatory Visit (HOSPITAL_COMMUNITY): Payer: BLUE CROSS/BLUE SHIELD

## 2018-12-27 ENCOUNTER — Ambulatory Visit (HOSPITAL_COMMUNITY): Payer: BLUE CROSS/BLUE SHIELD

## 2018-12-27 LAB — GLUCOSE, CAPILLARY
GLUCOSE-CAPILLARY: 91 mg/dL (ref 70–99)
Glucose-Capillary: 87 mg/dL (ref 70–99)
Glucose-Capillary: 96 mg/dL (ref 70–99)
Glucose-Capillary: 100 mg/dL — ABNORMAL HIGH (ref 70–99)

## 2018-12-27 MED ORDER — NIFEDIPINE ER OSMOTIC RELEASE 30 MG PO TB24
30.0000 mg | ORAL_TABLET | Freq: Two times a day (BID) | ORAL | Status: DC
  Administered 2018-12-27 – 2018-12-28 (×2): 30 mg via ORAL
  Filled 2018-12-27 (×2): qty 1

## 2018-12-27 NOTE — Lactation Note (Signed)
This note was copied from a baby's chart. Lactation Consultation Note  Patient Name: [name] Draven Merante [facility] Date: 12/27/2018   Baby [name] Rost now 34 hours old discontinuing double photo therapy now only on bili blanket.Mom reports she has not tried to breastfed ot pump since yesterday because he was only supposed to bottle fed while he was under the lights.  Discussed importance of pumping even if baby is not at the breast at this time.  Offered to give mom a refresher on pumping with DEBP, she reports she thinks she knows how.  Its time for infant to feed.  Offered to assist with breastfeeding.  Mom reports she does not want to breastfeed while he still is on the bili blanket, feels it would be to hard.  Urged her to pump everytime he takes a bottle and feed any breastmilk pumped and follow up with formula to make the amounts needed for supplementation.[name] had questions regarding amounts of formula.  Reports he still seems hungry after they give him the recommended amount. Reviewed to give him as much as he wants after getting the recommended amount in him. Infant has dry lips.  Urged mom to do some hand expression and rub colostrum on his lips in between feeds.  Mom did agree to hand express. Mom went to the bathroom to try and hand express.  Reports she did not get anything.  Asked if I could assist.  In agreement, mom able to hand express colostrum easily, had a spray across room. Urged mom to pump everytime he takes a bottle and call lactation as needed. Maternal Data    Feeding Feeding Type: Bottle Fed - Formula Nipple Type: Slow - flow  LATCH Score                   Interventions    Lactation Tools Discussed/Used     Consult Status      [name] [name] 12/27/2018, 8:24 AM

## 2018-12-27 NOTE — Progress Notes (Signed)
Subjective: Postpartum Day 2 : Cesarean Delivery Patient reports feeling a little emotional today. She denies HA or visual changes. Pain controlled. Ambulating, voiding, + BM and tolerating diet.    Objective: Vital signs in last 24 hours: Temp:  [98 F (36.7 C)-99.1 F (37.3 C)] 98.4 F (36.9 C) (03/27 0829) Pulse Rate:  [71-107] 107 (03/27 0829) Resp:  [18-20] 20 (03/27 0829) BP: (89-155)/(54-92) 151/90 (03/27 0829) SpO2:  [98 %-100 %] 99 % (03/27 0829)  Physical Exam:  General: alert Lochia: appropriate Uterine Fundus: firm Incision: healing well DVT Evaluation: No evidence of DVT seen on physical exam.  Recent Labs    12/25/18 1247 12/26/18 0443  HGB 10.5* 10.3*  HCT 30.7* 29.6*    Assessment/Plan: Status post Cesarean section. POD # 2 CHTN Type 2 DM  BP stable on qd Procardia. CBG's stable on Lantus bid and Metformin. Continue with progressive/supportive care.  Hermina Staggers 12/27/2018, 10:04 AM

## 2018-12-27 NOTE — Progress Notes (Signed)
Entered in error

## 2018-12-28 LAB — GLUCOSE, CAPILLARY: Glucose-Capillary: 103 mg/dL — ABNORMAL HIGH (ref 70–99)

## 2018-12-28 MED ORDER — HYDROCHLOROTHIAZIDE 25 MG PO TABS
25.0000 mg | ORAL_TABLET | Freq: Every day | ORAL | Status: DC
  Administered 2018-12-28: 25 mg via ORAL
  Filled 2018-12-28: qty 1

## 2018-12-28 MED ORDER — METFORMIN HCL 500 MG PO TABS: 500.0000 mg | ORAL_TABLET | Freq: Two times a day (BID) | ORAL | 6 refills | Status: DC

## 2018-12-28 MED ORDER — IBUPROFEN 600 MG PO TABS: 600.0000 mg | ORAL_TABLET | Freq: Four times a day (QID) | ORAL | 0 refills | Status: DC | PRN

## 2018-12-28 MED ORDER — OXYCODONE-ACETAMINOPHEN 5-325 MG PO TABS: 1.0000 | ORAL_TABLET | ORAL | 0 refills | Status: DC | PRN

## 2018-12-28 MED ORDER — INSULIN GLARGINE 100 UNIT/ML ~~LOC~~ SOLN: 25.0000 [IU] | Freq: Two times a day (BID) | SUBCUTANEOUS | 11 refills | Status: DC

## 2018-12-28 MED ORDER — HYDROCHLOROTHIAZIDE 25 MG PO TABS: 25.0000 mg | ORAL_TABLET | Freq: Every day | ORAL | 1 refills | Status: DC

## 2018-12-28 NOTE — Discharge Instructions (Signed)
°Cesarean Delivery, Care After °This sheet gives you information about how to care for yourself after your procedure. Your health care provider may also give you more specific instructions. If you have problems or questions, contact your health care provider. °What can I expect after the procedure? °After the procedure, it is common to have: °· A small amount of blood or clear fluid coming from the incision. °· Some redness, swelling, and pain in your incision area. °· Some abdominal pain and soreness. °· Vaginal bleeding (lochia). Even though you did not have a vaginal delivery, you will still have vaginal bleeding and discharge. °· Pelvic cramps. °· Fatigue. °You may have pain, swelling, and discomfort in the tissue between your vagina and your anus (perineum) if: °· Your C-section was unplanned, and you were allowed to labor and push. °· An incision was made in the area (episiotomy) or the tissue tore during attempted vaginal delivery. °Follow these instructions at home: °Incision care ° °· Follow instructions from your health care provider about how to take care of your incision. Make sure you: °? Wash your hands with soap and water before you change your bandage (dressing). If soap and water are not available, use hand sanitizer. °? If you have a dressing, change it or remove it as told by your health care provider. °? Leave stitches (sutures), skin staples, skin glue, or adhesive strips in place. These skin closures may need to stay in place for 2 weeks or longer. If adhesive strip edges start to loosen and curl up, you may trim the loose edges. Do not remove adhesive strips completely unless your health care provider tells you to do that. °· Check your incision area every day for signs of infection. Check for: °? More redness, swelling, or pain. °? More fluid or blood. °? Warmth. °? Pus or a bad smell. °· Do not take baths, swim, or use a hot tub until your health care provider says it's okay. Ask your  health care provider if you can take showers. °· When you cough or sneeze, hug a pillow. This helps with pain and decreases the chance of your incision opening up (dehiscing). Do this until your incision heals. °Medicines °· Take over-the-counter and prescription medicines only as told by your health care provider. °· If you were prescribed an antibiotic medicine, take it as told by your health care provider. Do not stop taking the antibiotic even if you start to feel better. °· Do not drive or use heavy machinery while taking prescription pain medicine. °Lifestyle °· Do not drink alcohol. This is especially important if you are breastfeeding or taking pain medicine. °· Do not use any products that contain nicotine or tobacco, such as cigarettes, e-cigarettes, and chewing tobacco. If you need help quitting, ask your health care provider. °Eating and drinking °· Drink at least 8 eight-ounce glasses of water every day unless told not to by your health care provider. If you breastfeed, you may need to drink even more water. °· Eat high-fiber foods every day. These foods may help prevent or relieve constipation. High-fiber foods include: °? Whole grain cereals and breads. °? Brown rice. °? Beans. °? Fresh fruits and vegetables. °Activity ° °· If possible, have someone help you care for your baby and help with household activities for at least a few days after you leave the hospital. °· Return to your normal activities as told by your health care provider. Ask your health care provider what activities are safe for   you. °· Rest as much as possible. Try to rest or take a nap while your baby is sleeping. °· Do not lift anything that is heavier than 10 lbs (4.5 kg), or the limit that you were told, until your health care provider says that it is safe. °· Talk with your health care provider about when you can engage in sexual activity. This may depend on your: °? Risk of infection. °? How fast you heal. °? Comfort and desire  to engage in sexual activity. °General instructions °· Do not use tampons or douches until your health care provider approves. °· Wear loose, comfortable clothing and a supportive and well-fitting bra. °· Keep your perineum clean and dry. Wipe from front to back when you use the toilet. °· If you pass a blood clot, save it and call your health care provider to discuss. Do not flush blood clots down the toilet before you get instructions from your health care provider. °· Keep all follow-up visits for you and your baby as told by your health care provider. This is important. °Contact a health care provider if: °· You have: °? A fever. °? Bad-smelling vaginal discharge. °? Pus or a bad smell coming from your incision. °? Difficulty or pain when urinating. °? A sudden increase or decrease in the frequency of your bowel movements. °? More redness, swelling, or pain around your incision. °? More fluid or blood coming from your incision. °? A rash. °? Nausea. °? Little or no interest in activities you used to enjoy. °? Questions about caring for yourself or your baby. °· Your incision feels warm to the touch. °· Your breasts turn red or become painful or hard. °· You feel unusually sad or worried. °· You vomit. °· You pass a blood clot from your vagina. °· You urinate more than usual. °· You are dizzy or light-headed. °Get help right away if: °· You have: °? Pain that does not go away or get better with medicine. °? Chest pain. °? Difficulty breathing. °? Blurred vision or spots in your vision. °? Thoughts about hurting yourself or your baby. °? New pain in your abdomen or in one of your legs. °? A severe headache. °· You faint. °· You bleed from your vagina so much that you fill more than one sanitary pad in one hour. Bleeding should not be heavier than your heaviest period. °Summary °· After the procedure, it is common to have pain at your incision site, abdominal cramping, and slight bleeding from your vagina. °· Check  your incision area every day for signs of infection. °· Tell your health care provider about any unusual symptoms. °· Keep all follow-up visits for you and your baby as told by your health care provider. °This information is not intended to replace advice given to you by your health care provider. Make sure you discuss any questions you have with your health care provider. °Document Released: 06/10/2002 Document Revised: 03/27/2018 Document Reviewed: 03/27/2018 °Elsevier Interactive Patient Education © 2019 [employer] ° °Postpartum Hypertension °Postpartum hypertension is high blood pressure that remains higher than normal after childbirth. You may not realize that you have postpartum hypertension if your blood pressure is not being checked regularly. In most cases, postpartum hypertension will go away on its own, usually within a week of delivery. However, for some women, medical treatment is required to prevent serious complications, such as seizures or stroke. °What are the causes? °This condition may be caused by one or more   of the following: °· Hypertension that existed before pregnancy (chronic hypertension). °· Hypertension that comes on as a result of pregnancy (gestational hypertension). °· Hypertensive disorders during pregnancy (preeclampsia) or seizures in women who have high blood pressure during pregnancy (eclampsia). °· A condition in which the liver, platelets, and red blood cells are damaged during pregnancy (HELLP syndrome). °· A condition in which the thyroid produces too much hormones (hyperthyroidism). °· Other rare problems of the nerves (neurological disorders) or blood disorders. °In some cases, the cause may not be known. °What increases the risk? °The following factors may make you more likely to develop this condition: °· Chronic hypertension. In some cases, this may not have been diagnosed before pregnancy. °· Obesity. °· Type 2 diabetes. °· Kidney disease. °· History of preeclampsia  or eclampsia. °· Other medical conditions that change the level of hormones in the body (hormonal imbalance). °What are the signs or symptoms? °As with all types of hypertension, postpartum hypertension may not have any symptoms. Depending on how high your blood pressure is, you may experience: °· Headaches. These may be mild, moderate, or severe. They may also be steady, [name], or sudden in onset (thunderclap headache). °· Changes in your ability to see (visual changes). °· Dizziness. °· Shortness of breath. °· Swelling of your hands, feet, lower legs, or face. In some cases, you may have swelling in more than one of these locations. °· Heart palpitations or a racing heartbeat. °· Difficulty breathing while lying down. °· Decrease in the amount of urine that you pass. °Other rare signs and symptoms may include: °· Sweating more than usual. This lasts longer than a few days after delivery. °· Chest pain. °· Sudden dizziness when you get up from sitting or lying down. °· Seizures. °· Nausea or vomiting. °· Abdominal pain. °How is this diagnosed? °This condition may be diagnosed based on the results of a physical exam, blood pressure measurements, and blood and urine tests. °You may also have other tests, such as a CT scan or an MRI, to check for other problems of postpartum hypertension. °How is this treated? °If blood pressure is high enough to require treatment, your options may include: °· Medicines to reduce blood pressure (antihypertensives). Tell your health care provider if you are breastfeeding or if you plan to breastfeed. There are many antihypertensive medicines that are safe to take while breastfeeding. °· Stopping medicines that may be causing hypertension. °· Treating medical conditions that are causing hypertension. °· Treating the complications of hypertension, such as seizures, stroke, or kidney problems. °Your health care provider will also continue to monitor your blood pressure closely until it  is within a safe range for you. °Follow these instructions at home: °· Take over-the-counter and prescription medicines only as told by your health care provider. °· Return to your normal activities as told by your health care provider. Ask your health care provider what activities are safe for you. °· Do not use any products that contain nicotine or tobacco, such as cigarettes and e-cigarettes. If you need help quitting, ask your health care provider. °· Keep all follow-up visits as told by your health care provider. This is important. °Contact a health care provider if: °· Your symptoms get worse. °· You have new symptoms, such as: °? A headache that does not get better. °? Dizziness. °? Visual changes. °Get help right away if: °· You suddenly develop swelling in your hands, ankles, or face. °· You have sudden, rapid weight gain. °·   You develop difficulty breathing, chest pain, racing heartbeat, or heart palpitations. °· You develop severe pain in your abdomen. °· You have any symptoms of a stroke. "BE FAST" is an easy way to remember the main warning signs of a stroke: °? B - Balance. Signs are dizziness, sudden trouble walking, or loss of balance. °? E - Eyes. Signs are trouble seeing or a sudden change in vision. °? F - Face. Signs are sudden weakness or numbness of the face, or the face or eyelid drooping on one side. °? A - Arms. Signs are weakness or numbness in an arm. This happens suddenly and usually on one side of the body. °? S - Speech. Signs are sudden trouble speaking, slurred speech, or trouble understanding what people say. °? T - Time. Time to call emergency services. Write down what time symptoms started. °· You have other signs of a stroke, such as: °? A sudden, severe headache with no known cause. °? Nausea or vomiting. °? Seizure. °These symptoms may represent a serious problem that is an emergency. Do not wait to see if the symptoms will go away. Get medical help right away. Call your local  emergency services (911 in the [place] Do not drive yourself to the hospital. °Summary °· Postpartum hypertension is high blood pressure that remains higher than normal after childbirth. °· In most cases, postpartum hypertension will go away on its own, usually within a week of delivery. °· For some women, medical treatment is required to prevent serious complications, such as seizures or stroke. °This information is not intended to replace advice given to you by your health care provider. Make sure you discuss any questions you have with your health care provider. °Document Released: 05/22/2014 Document Revised: 07/09/2017 Document Reviewed: 07/09/2017 °Elsevier Interactive Patient Education © 2019 [employer] ° °

## 2018-12-28 NOTE — Lactation Note (Signed)
This note was copied from a baby's chart. Lactation Consultation Note  Patient Name: Boy Leberta Whoolery [id number] Date: 12/28/2018 Reason for consult: Follow-up assessment   P1, Baby 73 hours old. Mother states she wants to provide her baby w/ breastmilk but has not pumped or latched baby. Encouraged her to call for assistance as needed. Recommend pumping  q 2.5- 3 hours. Mother has DEBP at home. Reviewed engorgement care and monitoring voids/stools.    Maternal Data Has patient been taught Hand Expression?: Yes  Feeding Feeding Type: Bottle Fed - Formula Nipple Type: Slow - flow  LATCH Score                   Interventions Interventions: DEBP  Lactation Tools Discussed/Used     Consult Status Consult Status: Complete Date: 12/28/18    Dahlia Byes [facility] 12/28/2018, 9:03 AM

## 2018-12-28 NOTE — Progress Notes (Signed)
Discharge instructions and prescriptions given to pt. Discussed post-op care, signs and symptoms to report to the MD, upcoming appointments, and medications. Pt verbalizes understanding and has no questions or concerns at this time. Pt discharged from hospital with baby in stable condition.

## 2018-12-29 ENCOUNTER — Inpatient Hospital Stay (HOSPITAL_COMMUNITY)
Admission: AD | Admit: 2018-12-29 | Payer: BLUE CROSS/BLUE SHIELD | Source: Home / Self Care | Admitting: Obstetrics and Gynecology

## 2018-12-29 ENCOUNTER — Inpatient Hospital Stay (HOSPITAL_COMMUNITY): Payer: BLUE CROSS/BLUE SHIELD

## 2019-01-02 ENCOUNTER — Other Ambulatory Visit: Payer: Self-pay

## 2019-01-02 ENCOUNTER — Ambulatory Visit (INDEPENDENT_AMBULATORY_CARE_PROVIDER_SITE_OTHER): Payer: BLUE CROSS/BLUE SHIELD | Admitting: Obstetrics & Gynecology

## 2019-01-02 ENCOUNTER — Telehealth: Payer: Self-pay | Admitting: Obstetrics & Gynecology

## 2019-01-02 ENCOUNTER — Encounter: Payer: Self-pay | Admitting: Obstetrics & Gynecology

## 2019-01-02 VITALS — BP 130/92 | HR 108 | Temp 98.8°F | Ht 67.0 in | Wt 214.1 lb

## 2019-01-02 DIAGNOSIS — Z9889 Other specified postprocedural states: Secondary | ICD-10-CM

## 2019-01-02 NOTE — Progress Notes (Signed)
  Subjective:     [name] is a 33 y.o. female who presents to the clinic 1 weeks status post ceseran section.Eating a regular diet without difficulty. Bowel movements are normal. Pain is controlled with current analgesics. Medications being used: ibuprofen (OTC).     Objective:    BP (!) 130/92   Pulse (!) 108   Temp 98.8 F (37.1 C)   Ht 5\' 7"  (1.702 m)   Wt 214 lb 1.3 oz (97.1 kg)   LMP 04/05/2018 (Exact Date)   BMI 33.53 kg/m  General:  alert  Abdomen: soft,  non-tender  Incision:   healing well, no drainage, well approximated, no dehiscence, incision well approximated     Assessment:    Doing well postoperatively.   Plan:    1. Continue any current medications. 2. Wound care discussed.  Reviewed plan of care with Dr. Rod Can, RN   Attestation of Attending Supervision of RN: Evaluation and management procedures were performed by the nurse under my supervision and collaboration.  I have reviewed the nursing note and chart, and I agree with the management and plan.  Carolyn L. Harraway-Smith, M.D., [place] [place]

## 2019-01-06 NOTE — Telephone Encounter (Signed)
TC to pt to confirm that there were no PP issues. She was seen by the nurse today. All of her questions were answered.  Bryanda Mikel L. Harraway-Smith, M.D., [place]

## 2019-01-08 NOTE — Addendum Note (Signed)
Addendum  created 01/08/19 1152 by Algis Greenhouse, CRNA   Attestation recorded in [place], Intraprocedure Attestations filed

## 2019-02-03 ENCOUNTER — Other Ambulatory Visit: Payer: Self-pay

## 2019-02-03 ENCOUNTER — Ambulatory Visit (INDEPENDENT_AMBULATORY_CARE_PROVIDER_SITE_OTHER): Payer: BLUE CROSS/BLUE SHIELD | Admitting: Family Medicine

## 2019-02-03 ENCOUNTER — Encounter: Payer: Self-pay | Admitting: Family Medicine

## 2019-02-03 VITALS — BP 115/81 | HR 98 | Ht 67.0 in | Wt 205.0 lb

## 2019-02-03 DIAGNOSIS — Z3202 Encounter for pregnancy test, result negative: Secondary | ICD-10-CM | POA: Diagnosis not present

## 2019-02-03 DIAGNOSIS — E119 Type 2 diabetes mellitus without complications: Secondary | ICD-10-CM

## 2019-02-03 DIAGNOSIS — I10 Essential (primary) hypertension: Secondary | ICD-10-CM

## 2019-02-03 DIAGNOSIS — Z30017 Encounter for initial prescription of implantable subdermal contraceptive: Secondary | ICD-10-CM | POA: Diagnosis not present

## 2019-02-03 LAB — POCT URINE PREGNANCY: Preg Test, Ur: NEGATIVE

## 2019-02-03 MED ORDER — ETONOGESTREL 68 MG ~~LOC~~ IMPL
68.0000 mg | DRUG_IMPLANT | Freq: Once | SUBCUTANEOUS | Status: AC
  Administered 2019-02-03: 10:00:00 68 mg via SUBCUTANEOUS

## 2019-02-03 NOTE — Addendum Note (Signed)
Addended by: Mikey Bussing on: 02/03/2019 09:48 AM   Modules accepted: Orders

## 2019-02-03 NOTE — Progress Notes (Addendum)
Subjective:     [name] is a 33 y.o. female who presents for a postpartum visit. She is 5 weeks postpartum following a low transverse Cesarean section. I have fully reviewed the prenatal and intrapartum course. The delivery was at 36 gestational weeks. Outcome: repeat cesarean section, low transverse incision. Anesthesia: spinal. Postpartum course has been normal. Baby's course has been normal. Baby is feeding by bottle - Similac Advance. Bleeding no bleeding. Bowel function is normal. Bladder function is normal. Patient is not sexually active. Contraception method is Nexplanon. Postpartum depression screening: negative.  The following portions of the patient's history were reviewed and updated as appropriate: allergies, current medications, past family history, past medical history, past social history, past surgical history and problem list.  Review of Systems Pertinent items are noted in HPI.   Objective:  BP 115/81   Pulse 98   Ht 5\' 7"  (1.702 m)   Wt 205 lb (93 kg)   LMP 04/05/2018 (Exact Date)   Breastfeeding Unknown   BMI 32.11 kg/m    LMP 04/05/2018 (Exact Date)   General:  alert, cooperative and no distress  Lungs: clear to auscultation bilaterally  Heart:  regular rate and rhythm, S1, S2 normal, no murmur, click, rub or gallop  Abdomen: soft, non-tender; bowel sounds normal; no masses,  no organomegaly. Incision clean, dry, intact. Well healed.         Nexplanon Insertion:  Patient given informed consent, signed copy in the chart, time out was performed. Pregnancy test was negative. Appropriate time out taken.  Patient's left arm was prepped and draped in the usual sterile fashion.. The ruler used to measure and mark insertion area.  Pt was prepped with alcohol swab and then injected with 3 cc of 2% lidocaine with epinephrine.  Pt was prepped with betadine, Implanon removed form packaging,  Device confirmed in needle, then inserted full length of needle and withdrawn per  handbook instructions.  Device palpated by physician and patient.  Pt insertion site covered with pressure dressing.   Minimal blood loss.  Pt tolerated the procedure well.    Assessment:     Normal postpartum exam. Pap smear not done at today's visit.   Plan:    1. Contraception: Nexplanon 2. BP controlled 3. Patient to go to PCP for management of DM2 4. Follow up in: 3 months or as needed.

## 2019-02-05 ENCOUNTER — Other Ambulatory Visit: Payer: Self-pay | Admitting: Obstetrics and Gynecology

## 2019-06-11 ENCOUNTER — Ambulatory Visit (INDEPENDENT_AMBULATORY_CARE_PROVIDER_SITE_OTHER): Payer: BC Managed Care – PPO | Admitting: Family Medicine

## 2019-06-11 ENCOUNTER — Encounter: Payer: Self-pay | Admitting: Family Medicine

## 2019-06-11 ENCOUNTER — Other Ambulatory Visit: Payer: Self-pay

## 2019-06-11 VITALS — BP 153/96 | HR 107 | Ht 67.0 in | Wt 218.0 lb

## 2019-06-11 DIAGNOSIS — Z3046 Encounter for surveillance of implantable subdermal contraceptive: Secondary | ICD-10-CM

## 2019-06-11 MED ORDER — NORETHIN ACE-ETH ESTRAD-FE 1-20 MG-MCG(24) PO TABS: 1.0000 | ORAL_TABLET | Freq: Every day | ORAL | 3 refills | Status: DC

## 2019-06-11 NOTE — Progress Notes (Signed)
Patient complaining of irregular bleeding with Nexplanon. Patient states last period was one and half months long. Patient desires to have Nexplanon removed. Desires pill. Patient due for annual exam after 06-21-2019  Kathrene Alu RN

## 2019-06-11 NOTE — Progress Notes (Signed)
Having breakthrough bleeding - would like nexplanon out.  Nexplanon Removal:  Patient given informed consent for removal of her Implanon, time out was performed.  Signed copy in the chart.  Appropriate time out taken. Implanon site identified.  Area prepped in usual sterile fashon. One cc of 1% lidocaine was used to anesthetize the area at the distal end of the implant. A small stab incision was made right beside the implant on the distal portion.  The implanon rod was grasped using hemostats and removed without difficulty.  There was less than 3 cc blood loss. There were no complications.  A small amount of antibiotic ointment and steri-strips were applied over the small incision.  A pressure bandage was applied to reduce any bruising.  The patient tolerated the procedure well and was given post procedure instructions.

## 2019-09-29 ENCOUNTER — Telehealth: Payer: Self-pay

## 2019-09-29 NOTE — Telephone Encounter (Signed)
A  Prior authorization form was faxed to our office for Levemir insulin pen. Levemir is no longer on pt's med list. Called pt and left message for pt to call the office back. Sakai Heinle l Starsky Nanna, CMA

## 2019-12-13 ENCOUNTER — Ambulatory Visit: Payer: Medicaid Other | Attending: Internal Medicine

## 2019-12-13 DIAGNOSIS — Z23 Encounter for immunization: Secondary | ICD-10-CM

## 2019-12-13 NOTE — Progress Notes (Signed)
   Covid-19 Vaccination Clinic  Name:  Torie Towle    MRN: [id number] DOB: Dec 02, 1985  12/13/2019  Ms. Reyez was observed post Covid-19 immunization for 15 minutes without incident. She was provided with Vaccine Information Sheet and instruction to access the V-Safe system.   Ms. Morden was instructed to call 911 with any severe reactions post vaccine: Marland Kitchen Difficulty breathing  . Swelling of face and throat  . A fast heartbeat  . A bad rash all over body  . Dizziness and weakness   Immunizations Administered    Name Date Dose VIS Date Route   Moderna COVID-19 Vaccine 12/13/2019  1:42 PM 0.5 mL 09/02/2019 Intramuscular   Manufacturer: Moderna   Lot: [id number]   NDC: 76226-333-54

## 2020-01-14 ENCOUNTER — Ambulatory Visit: Payer: Medicaid Other | Attending: Internal Medicine

## 2020-01-14 DIAGNOSIS — Z23 Encounter for immunization: Secondary | ICD-10-CM

## 2020-01-14 IMAGING — US US MFM OB FOLLOW-UP
1 series · 13 of 28 positions shown · non-contrast
Comparison: none

[Series 1: us mfm ob follow-up · 28 acquisitions, 13 frames shown]
[im 2/28]
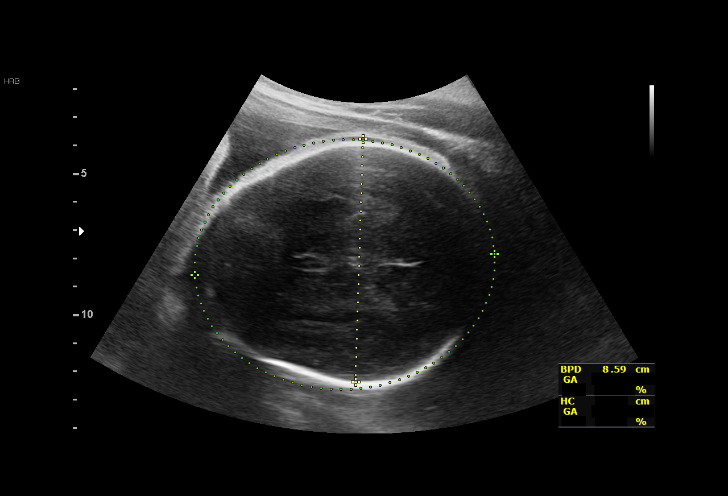
[im 4/28]
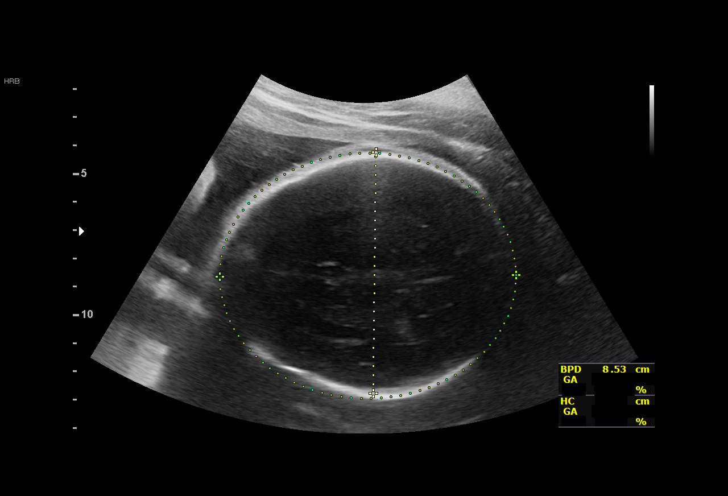
[im 6/28]
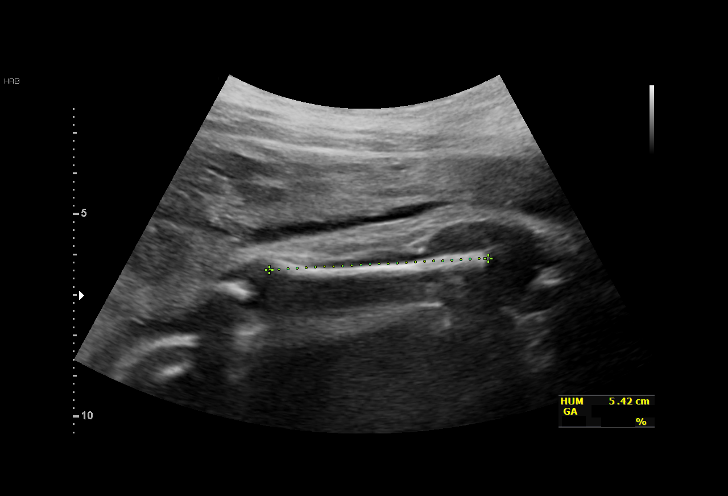
[im 8/28]
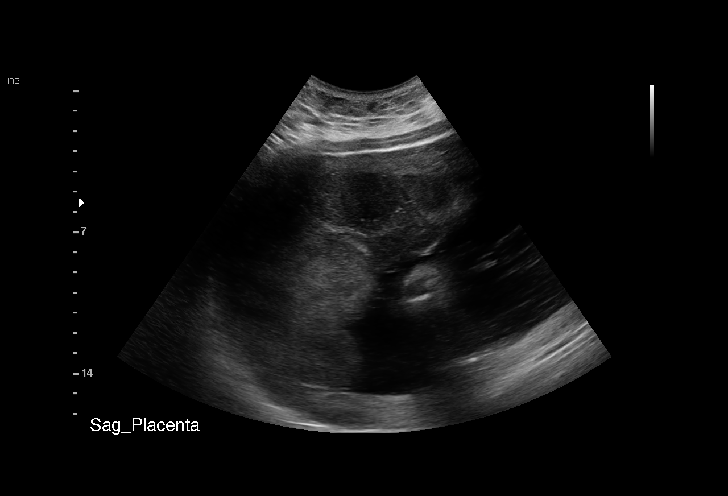
[im 10/28]
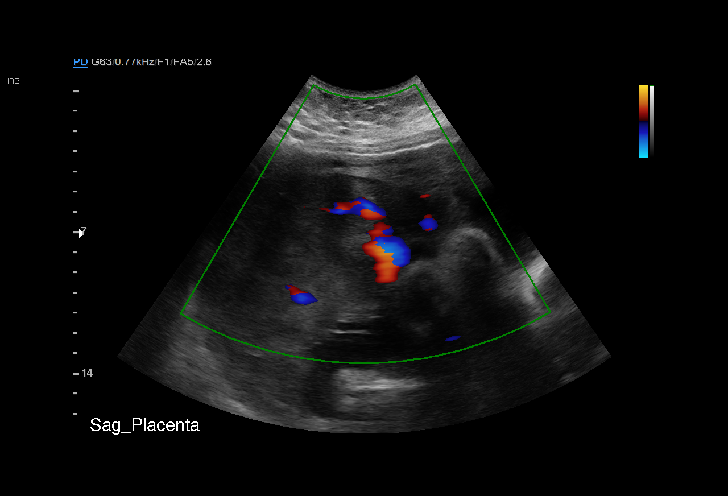
[im 12/28]
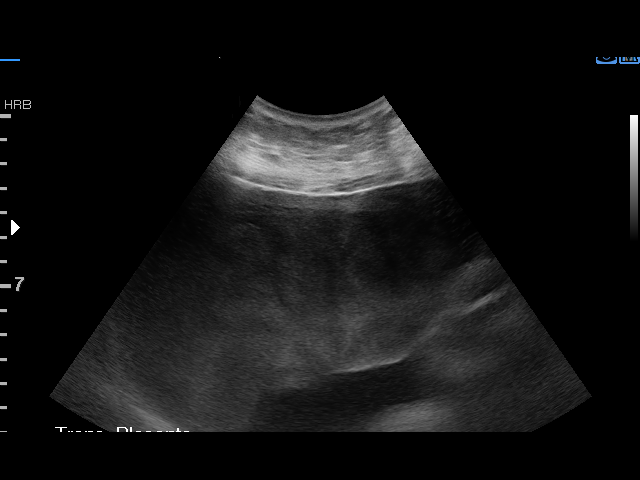
[im 15/28]
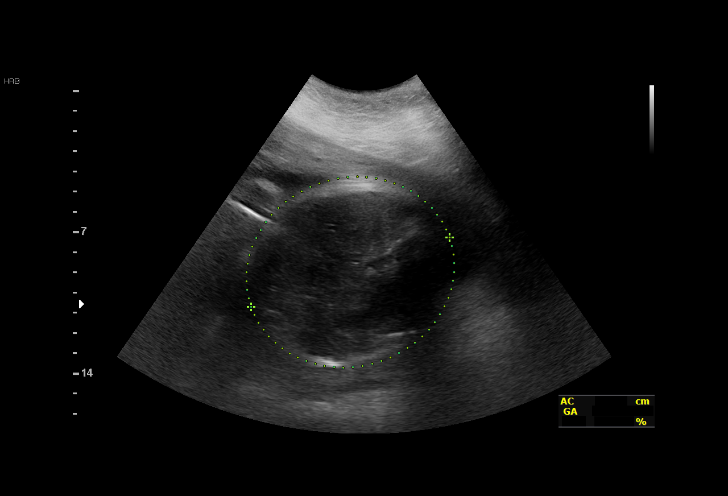
[im 17/28]
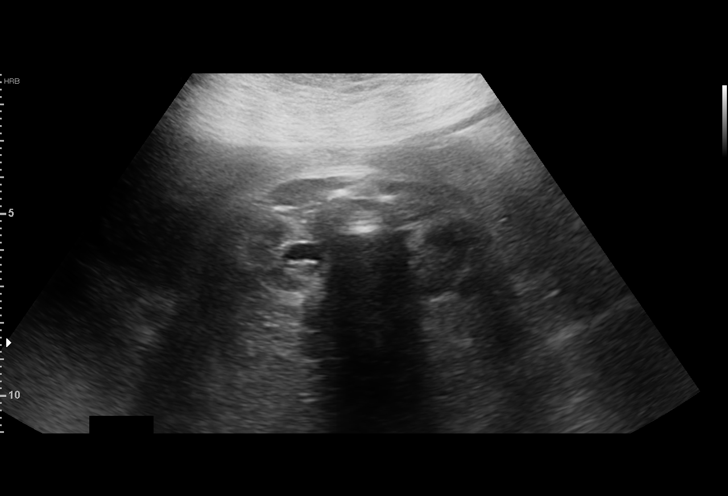
[im 19/28]
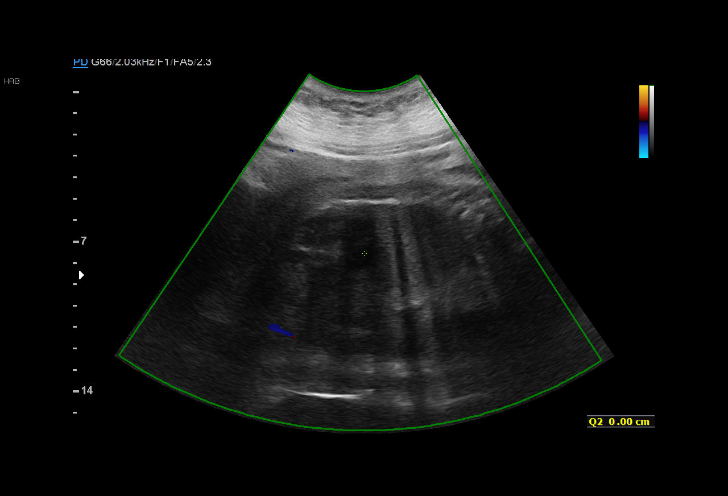
[im 21/28]
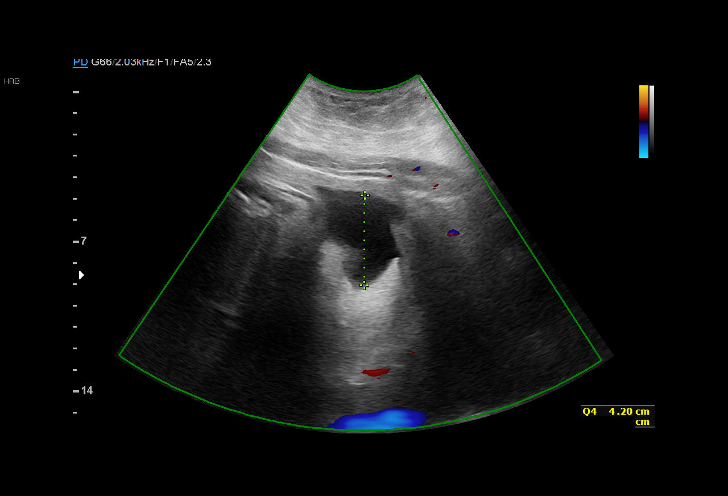
[im 23/28]
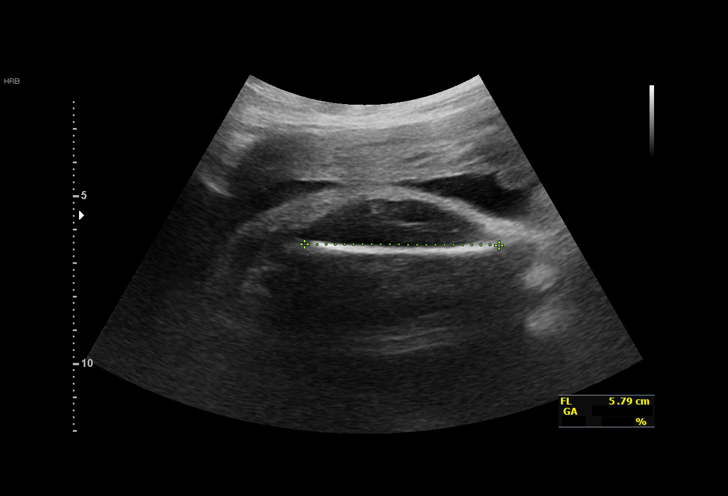
[im 25/28]
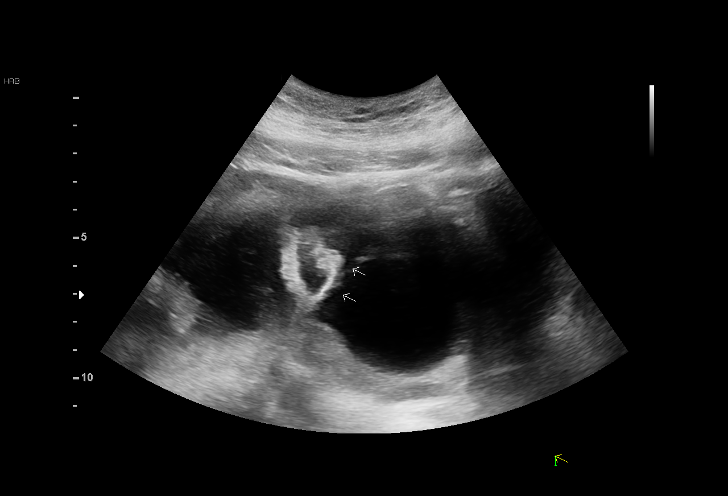
[im 27/28]
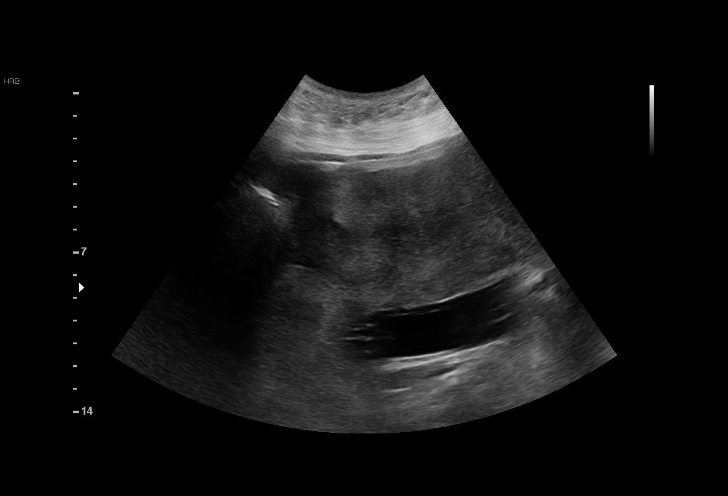

[13 of 28 positions shown; findings below may reference images not displayed]

ESTUPINAN
 ----------------------------------------------------------------------

 ----------------------------------------------------------------------
Indications

  Encounter for other antenatal screening
  follow-up (low risk NIPS)
  Obesity complicating pregnancy, second
  trimester (Pre Pregnancy BMI 31.5)
  Hypertension - Chronic/Pre-existing
  Pre-existing diabetes, type 2, in pregnancy,
  second trimester (Insulin/metformin) NML
  ECHO
  30 weeks gestation of pregnancy
 ----------------------------------------------------------------------
Vital Signs

 BMI:
Fetal Evaluation

 Num Of Fetuses:         1
 Cardiac Activity:       Observed
 Presentation:           Cephalic
 Placenta:               Right lateral

 Amniotic Fluid
 AFI FV:      Within normal limits

 AFI Sum(cm)     %Tile       Largest Pocket(cm)
 11.9            30

 RUQ(cm)       RLQ(cm)       LUQ(cm)        LLQ(cm)
 4.3           4.2           0
Biometry

 BPD:      85.7  mm     G. Age:  34w 4d       > 99  %    CI:        79.59   %    70 - 86
                                                         FL/HC:      19.5   %    19.3 -
 HC:      303.6  mm     G. Age:  33w 5d         90  %    HC/AC:      0.98        0.96 -
 AC:       309   mm     G. Age:  34w 6d       > 97  %    FL/BPD:     69.0   %    71 - 87
 FL:       59.1  mm     G. Age:  30w 6d         39  %    FL/AC:      19.1   %    20 - 24
 HUM:      54.2  mm     G. Age:  31w 4d         69  %

 LV:        7.3  mm

 Est. FW:    7786  gm    4 lb 15 oz      90  %
OB History

 Gravidity:    1
Gestational Age

 LMP:           32w 0d        Date:  04/05/18                 EDD:   01/10/19
 U/S Today:     33w 4d                                        EDD:   12/30/18
 Best:          30w 5d     Det. By:  Early Ultrasound         EDD:   01/19/19
                                     (06/20/18)
Anatomy

 Cranium:               Appears normal         Aortic Arch:            Previously seen
 Cavum:                 Appears normal         Ductal Arch:            Previously seen
 Ventricles:            Appears normal         Diaphragm:              Previously seen
 Choroid Plexus:        Previously seen        Stomach:                Appears normal, left
                                                                       sided
 Cerebellum:            Previously seen        Abdomen:                Appears normal
 Posterior Fossa:       Previously seen        Abdominal Wall:         Previously seen
 Nuchal Fold:           Previously seen        Cord Vessels:           Previously seen
 Face:                  Appears normal         Kidneys:                Appear normal
                        (orbits and profile)
 Lips:                  Previously seen        Bladder:                Appears normal
 Thoracic:              Appears normal         Spine:                  Previously seen
 Heart:                 Previously seen        Upper Extremities:      Previously seen
 RVOT:                  Not well visualized    Lower Extremities:      Previously seen
 LVOT:                  Previously seen

 Other:  Fetus appears to be a male. Heels prev visualized. Technically
         difficult due to maternal habitus and fetal position.
Cervix Uterus Adnexa

 Cervix
 Not visualized (advanced GA >47wks)
Impression

 Pregestational diabetes. Patient takes lantus insulin 60 units
 in the morning and 50 units at night. She takes Humalog
 07/11/09 units with meals. She reports her blood glucose
 levels are within normal range.

 Chronic hypertension: Patient takes labetalol.

 Amniotic fluid is normal and good fetal activity is seen. The
 estimated fetal weight is at greater than the 90th percentile.
 Abdominal circumference measures at greater than the 95th
 percentile.

 I discussed the importance of blood glucose control to
 prevent adverse fetal and neonatal outcomes.
Recommendations

 -Weekly antenatal testing from 32 weeks till delivery.
                 Servellon, Darla

## 2020-01-14 NOTE — Progress Notes (Signed)
   Covid-19 Vaccination Clinic  Name:  [name]    MRN: [id number] DOB: [birth year]  01/14/2020  [name] was observed post Covid-19 immunization for 15 minutes without incident. She was provided with Vaccine Information Sheet and instruction to access the V-Safe system.   [name] was instructed to call 911 with any severe reactions post vaccine: Marland Kitchen Difficulty breathing  . Swelling of face and throat  . A fast heartbeat  . A bad rash all over body  . Dizziness and weakness   Immunizations Administered    Name Date Dose VIS Date Route   Moderna COVID-19 Vaccine 01/14/2020  8:28 AM 0.5 mL 09/02/2019 Intramuscular   Manufacturer: Moderna   Lot: [id number]   NDC: 49447-395-84

## 2020-02-04 IMAGING — US US MFM FETAL BPP W/ NON-STRESS
1 series · 15 of 26 positions shown · non-contrast
Comparison: none

[Series 1: us mfm fetal bpp w/ non-stress · 26 acquisitions, 15 frames shown]
[im 1/26]
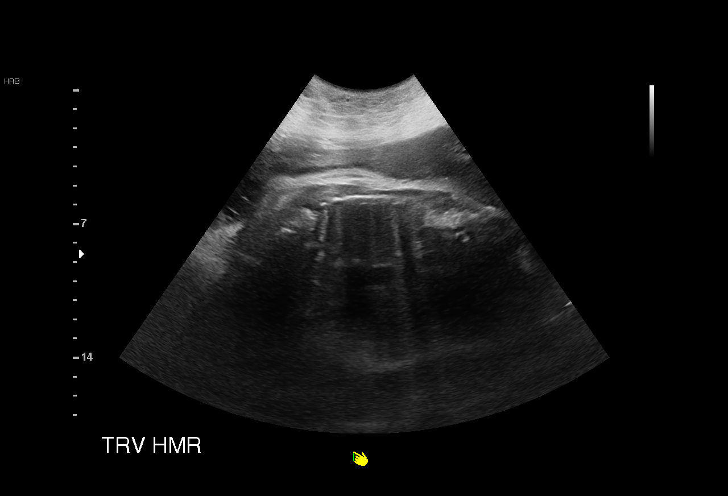
[im 3/26]
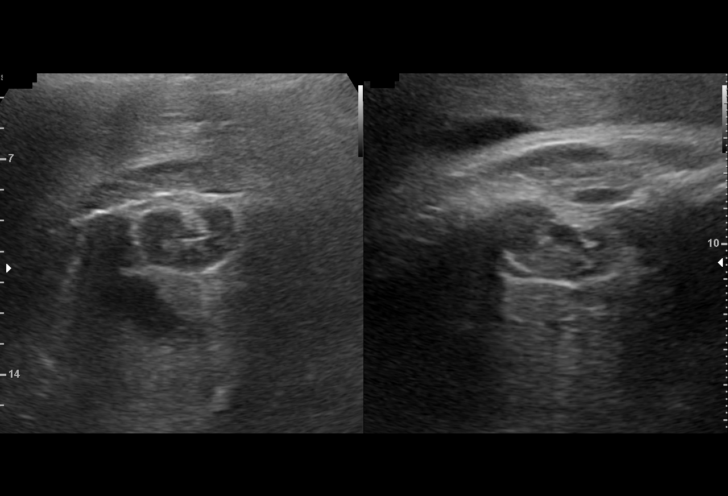
[im 5/26]
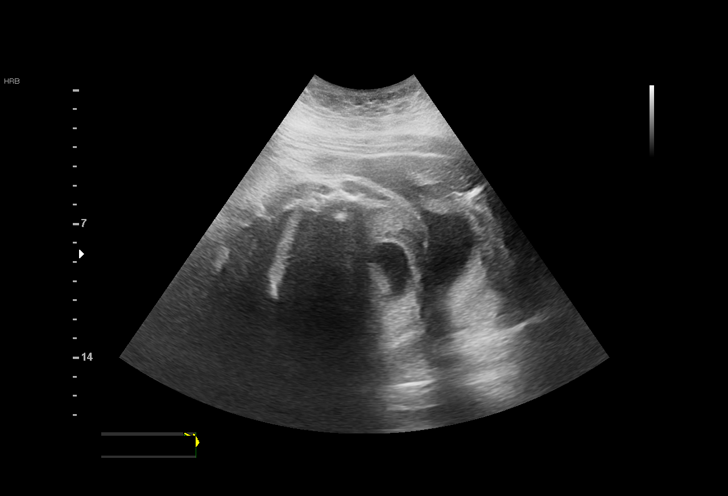
[im 7/26]
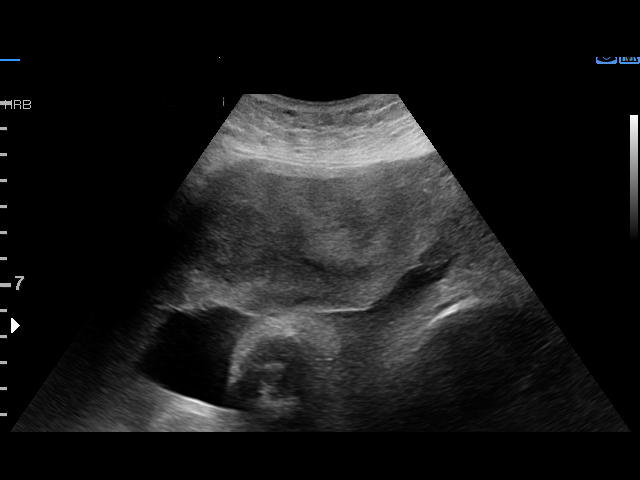
[im 8/26]
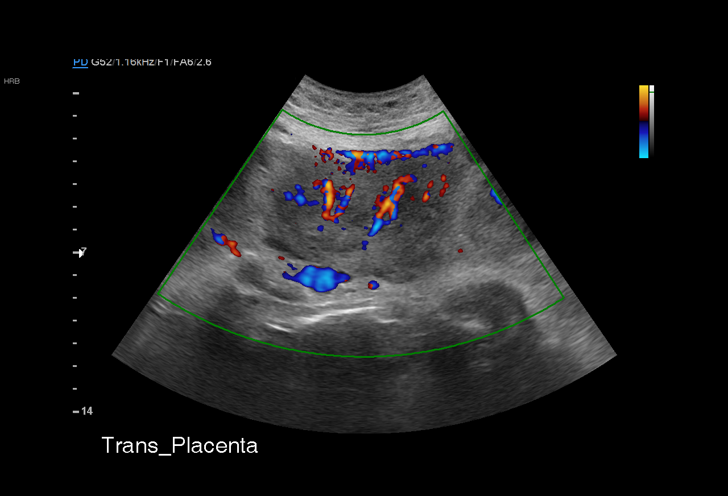
[im 10/26]
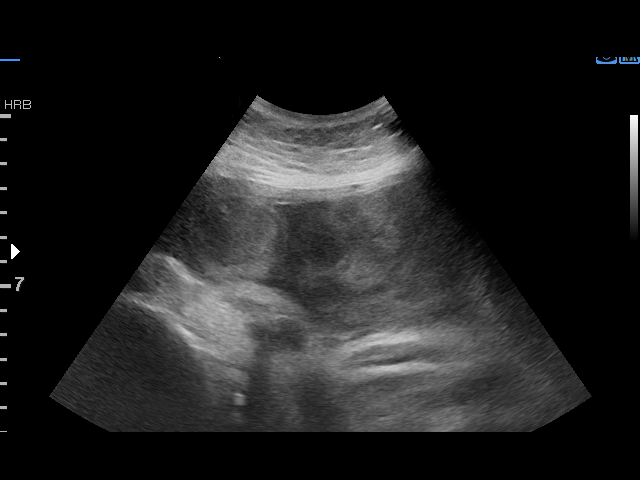
[im 12/26]
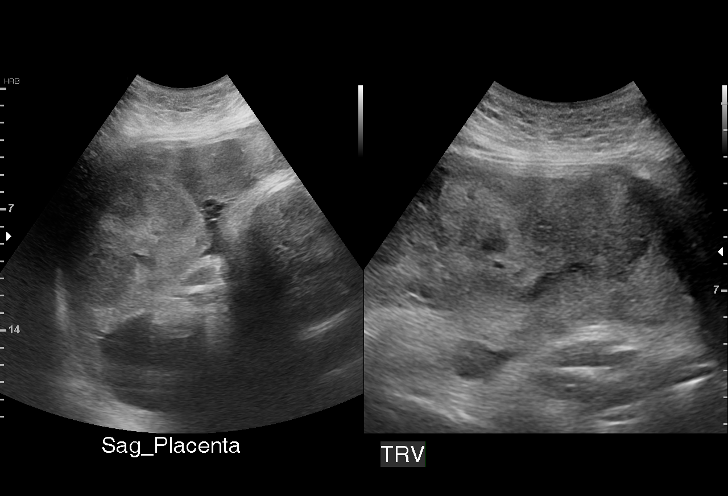
[im 14/26]
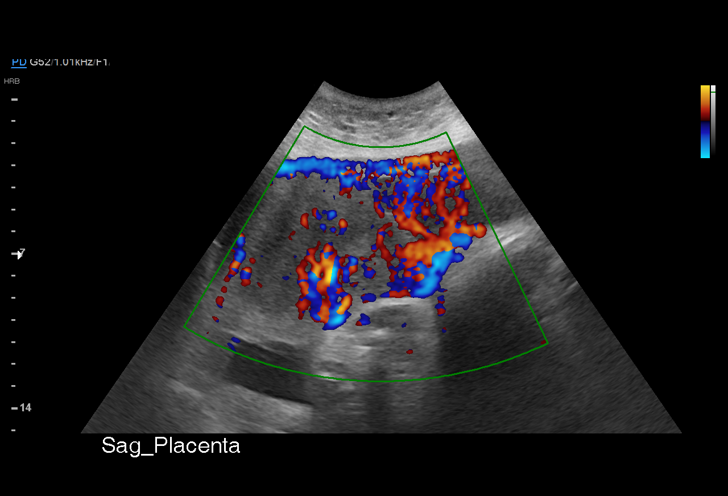
[im 15/26]
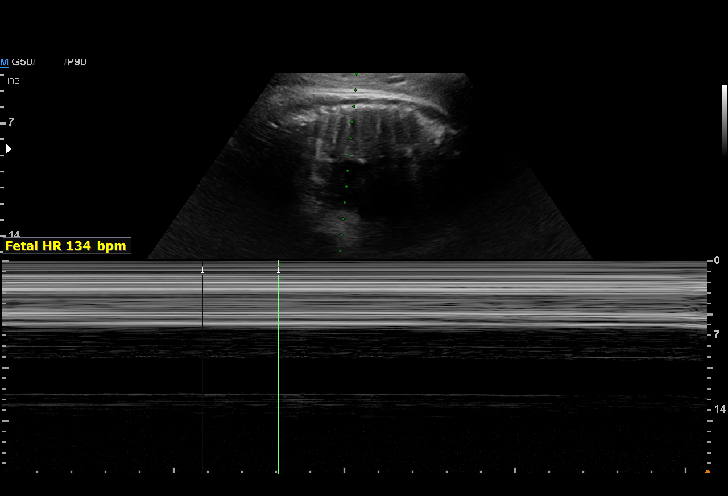
[im 17/26]
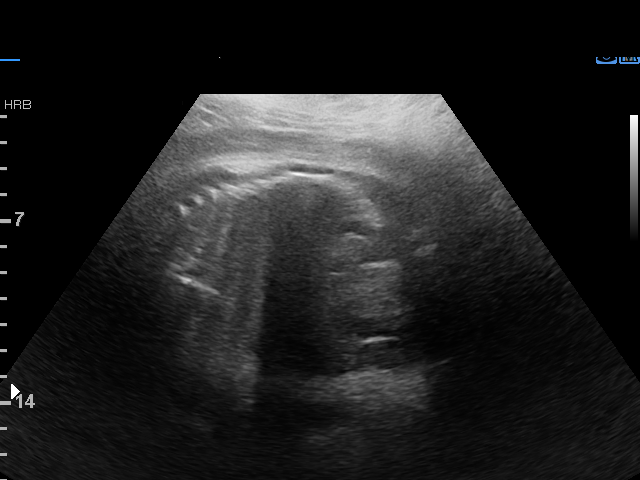
[im 19/26]
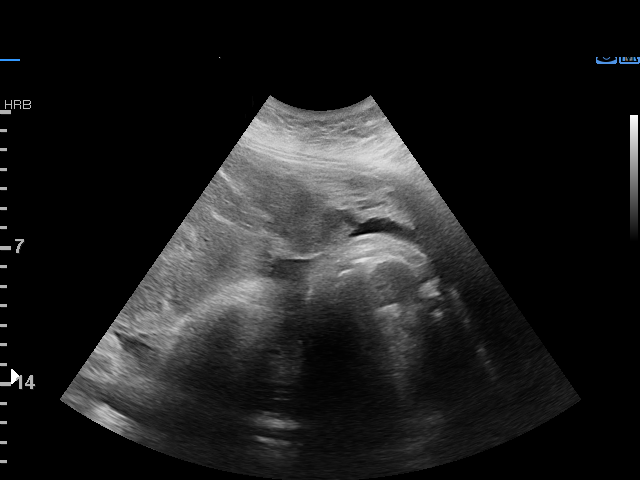
[im 20/26]
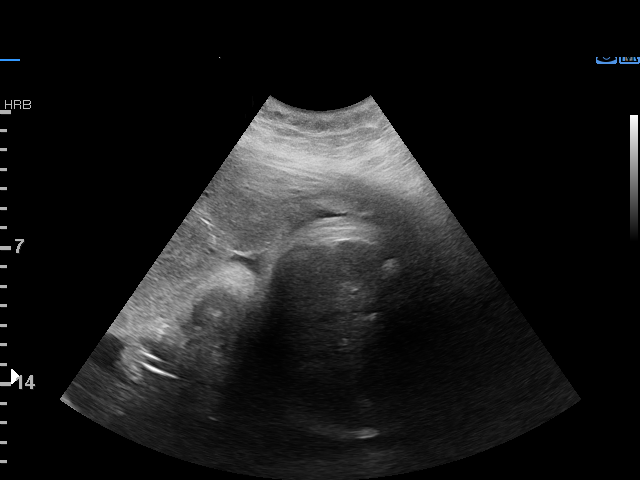
[im 22/26]
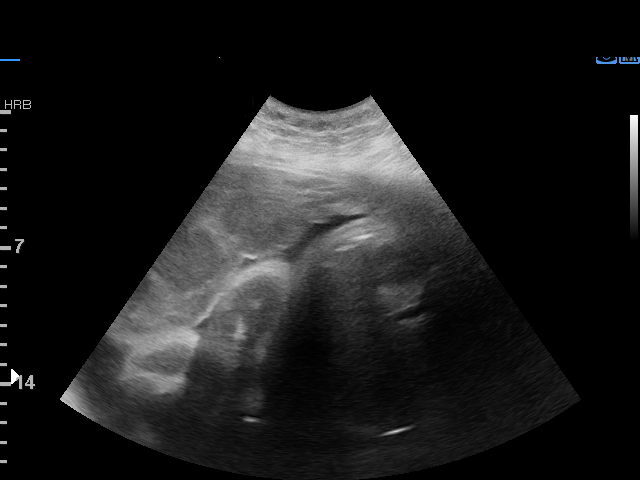
[im 24/26]
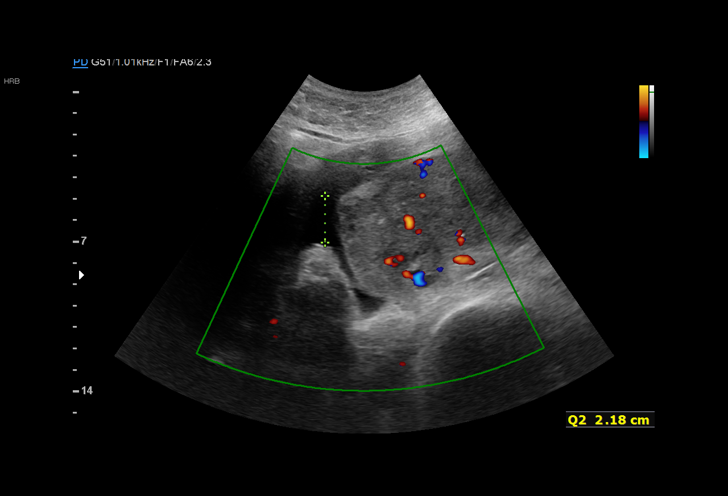
[im 26/26]
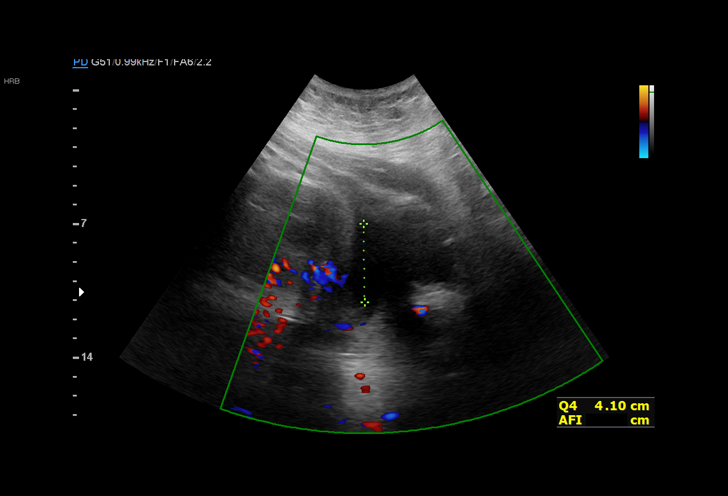

[15 of 26 positions shown; findings below may reference images not displayed]

----------------------------------------------------------------------

 ----------------------------------------------------------------------
Indications

  Abnormal ultrasound finding on antenatal
  screening of mother
  Obesity complicating pregnancy, third
  trimester (Pre Pregnancy BMI 31.5)
  Hypertension - Chronic/Pre-existing
  Pre-existing diabetes, type 2, in pregnancy,
  third trimester (Insulin/metformin) NML ECHO
  33 weeks gestation of pregnancy
 ----------------------------------------------------------------------
Vital Signs

                                                 Height:        5'7"
Fetal Evaluation

 Num Of Fetuses:          1
 Fetal Heart Rate(bpm):   132
 Cardiac Activity:        Observed
 Presentation:            Transverse, head to maternal right
 Placenta:                Anterior

 Amniotic Fluid
 AFI FV:      Within normal limits

 AFI Sum(cm)     %Tile       Largest Pocket(cm)
 11.33           29

 RUQ(cm)       RLQ(cm)        LUQ(cm)        LLQ(cm)
 5.05          4.1            2.18           0
Biophysical Evaluation

 Amniotic F.V:   Within normal limits        F. Tone:         Observed
 F. Movement:    Not Observed                Score:           [DATE]
 F. Breathing:   Observed
OB History

 Gravidity:     1
Gestational Age

 LMP:            35w 0d       Date:  04/05/18                   EDD:  01/10/19
 Best:           33w 5d    Det. By:  Early Ultrasound           EDD:  01/19/19
                                     (06/20/18)
Anatomy

 Thoracic:               Appears normal         Kidneys:                Appear normal
 Stomach:                Appears normal, left   Bladder:                Appears normal
                         sided
 Abdomen:                Appears normal
Cervix Uterus Adnexa

 Cervix
 Not visualized (advanced GA >77wks)
Impression

 Biophysical profile ([DATE])
 BPP elevated 154/106 mmHg- repeat similar sent to [facility]
Recommendations

 Follow up 1 week (previously scheduled)

## 2020-12-15 ENCOUNTER — Encounter (HOSPITAL_COMMUNITY): Payer: Self-pay | Admitting: Obstetrics & Gynecology

## 2020-12-15 ENCOUNTER — Inpatient Hospital Stay (HOSPITAL_COMMUNITY)
Admission: AD | Admit: 2020-12-15 | Discharge: 2020-12-15 | Disposition: A | Discharge status: Home / Self Care | Payer: Medicaid Other | Attending: Obstetrics & Gynecology | Admitting: Obstetrics & Gynecology

## 2020-12-15 ENCOUNTER — Other Ambulatory Visit: Payer: Self-pay

## 2020-12-15 ENCOUNTER — Inpatient Hospital Stay (HOSPITAL_COMMUNITY): Payer: Medicaid Other

## 2020-12-15 DIAGNOSIS — Z3A10 10 weeks gestation of pregnancy: Secondary | ICD-10-CM | POA: Insufficient documentation

## 2020-12-15 DIAGNOSIS — Z87891 Personal history of nicotine dependence: Secondary | ICD-10-CM | POA: Insufficient documentation

## 2020-12-15 DIAGNOSIS — Z98891 History of uterine scar from previous surgery: Secondary | ICD-10-CM | POA: Insufficient documentation

## 2020-12-15 DIAGNOSIS — O021 Missed abortion: Secondary | ICD-10-CM

## 2020-12-15 DIAGNOSIS — O10911 Unspecified pre-existing hypertension complicating pregnancy, first trimester: Secondary | ICD-10-CM | POA: Diagnosis not present

## 2020-12-15 DIAGNOSIS — O3680X0 Pregnancy with inconclusive fetal viability, not applicable or unspecified: Secondary | ICD-10-CM

## 2020-12-15 DIAGNOSIS — I10 Essential (primary) hypertension: Secondary | ICD-10-CM

## 2020-12-15 DIAGNOSIS — E119 Type 2 diabetes mellitus without complications: Secondary | ICD-10-CM

## 2020-12-15 DIAGNOSIS — O209 Hemorrhage in early pregnancy, unspecified: Secondary | ICD-10-CM

## 2020-12-15 DIAGNOSIS — O24112 Pre-existing diabetes mellitus, type 2, in pregnancy, second trimester: Secondary | ICD-10-CM | POA: Diagnosis not present

## 2020-12-15 DIAGNOSIS — E1169 Type 2 diabetes mellitus with other specified complication: Secondary | ICD-10-CM

## 2020-12-15 HISTORY — DX: Missed abortion: O02.1

## 2020-12-15 LAB — URINALYSIS, ROUTINE W REFLEX MICROSCOPIC
Bilirubin Urine: NEGATIVE
Glucose, UA: >=500 mg/dL — AB
Hgb urine dipstick: NEGATIVE
Ketones, ur: 5 mg/dL — AB
Leukocytes,Ua: NEGATIVE
Nitrite: NEGATIVE
Protein, ur: >=300 mg/dL — AB
Specific Gravity, Urine: 1.027 (ref 1.005–1.030)
pH: 5 (ref 5.0–8.0)

## 2020-12-15 LAB — BASIC METABOLIC PANEL
Anion gap: 7 (ref 5–15)
BUN: 11 mg/dL (ref 6–20)
CO2: 26 mmol/L (ref 22–32)
Calcium: 9 mg/dL (ref 8.9–10.3)
Chloride: 98 mmol/L (ref 98–111)
Creatinine, Ser: 0.68 mg/dL (ref 0.44–1.00)
GFR, Estimated: >60 mL/min (ref >60)
Glucose, Bld: 353 mg/dL — ABNORMAL HIGH (ref 70–99)
Potassium: 4 mmol/L (ref 3.5–5.1)
Sodium: 131 mmol/L — ABNORMAL LOW (ref 135–145)

## 2020-12-15 LAB — CBC
HCT: 37.5 % (ref 36.0–46.0)
Hemoglobin: 13.5 g/dL (ref 12.0–15.0)
MCH: 30.3 pg (ref 26.0–34.0)
MCHC: 36 g/dL (ref 30.0–36.0)
MCV: 84.3 fL (ref 80.0–100.0)
Platelets: 418 10*3/uL — ABNORMAL HIGH (ref 150–400)
RBC: 4.45 MIL/uL (ref 3.87–5.11)
RDW: 12.4 % (ref 11.5–15.5)
WBC: 8.7 10*3/uL (ref 4.0–10.5)
nRBC: 0 % (ref 0.0–0.2)

## 2020-12-15 LAB — WET PREP, GENITAL
Sperm: NONE SEEN
Trich, Wet Prep: NONE SEEN
Yeast Wet Prep HPF POC: NONE SEEN

## 2020-12-15 LAB — POCT PREGNANCY, URINE: Preg Test, Ur: POSITIVE — AB

## 2020-12-15 LAB — HCG, QUANTITATIVE, PREGNANCY: hCG, Beta Chain, Quant, S: 11857 m[IU]/mL — ABNORMAL HIGH (ref <5)

## 2020-12-15 MED ORDER — METFORMIN HCL 1000 MG PO TABS: 1000.0000 mg | ORAL_TABLET | Freq: Two times a day (BID) | ORAL | 0 refills | Status: DC

## 2020-12-15 NOTE — Progress Notes (Signed)
Pt upset and crying due to news of MAB. Family member with pt and supportive. Pt has h/a due to crying and did not want her b/p taken due to crying and knowing her b/p would be high. Heart given and emotional support given. WRitten and verbal discharge instructions given and understanding voiced.

## 2020-12-15 NOTE — Discharge Instructions (Signed)
Miscarriage A miscarriage is the loss of pregnancy before the 20th week. Most miscarriages happen during the first 3 months of pregnancy. Sometimes, a miscarriage can happen before a woman knows that she is pregnant. Having a miscarriage can be an emotional experience. If you have had a miscarriage, talk with your health care provider about any questions you may have about the loss of your baby, the grieving process, and your plans for future pregnancy. What are the causes? Many times, the cause of a miscarriage is not known. What increases the risk? The following factors may make a pregnant woman more likely to have a miscarriage: Certain medical conditions  Conditions that affect the hormone balance in the body, such as thyroid disease or polycystic ovary syndrome.  Diabetes.  Autoimmune disorders.  Infections.  Bleeding disorders.  Obesity. Lifestyle factors  Using products with tobacco or nicotine in them or being exposed to tobacco smoke.  Having alcohol.  Having large amounts of caffeine.  Recreational drug use. Problems with reproductive organs or structures  Cervical insufficiency. This is when the lowest part of the uterus (cervix) opens and thins before pregnancy is at term.  Having a condition called Asherman syndrome. This syndrome causes scarring in the uterus or causes the uterus to be abnormal in structure.  Fibrous growths, called fibroids, in the uterus.  Congenital abnormalities. These problems are present at birth.  Infection of the cervix or uterus. Personal or medical history  Injury (trauma).  Having had a miscarriage before.  Being younger than age 18 or older than age 35.  Exposure to harmful substances in the environment. This may include radiation or heavy metals, such as lead.  Use of certain medicines. What are the signs or symptoms? Symptoms of this condition include:  Vaginal bleeding or spotting, with or without cramps or  pain.  Pain or cramping in the abdomen or lower back.  Fluid or tissue coming out of the vagina. How is this diagnosed? This condition may be diagnosed based on:  A physical exam.  Ultrasound.  Lab tests, such as blood tests, urine tests, or swabs for infection. How is this treated? Treatment for a miscarriage is sometimes not needed if all the pregnancy tissue that was in the uterus comes out on its own, and there are no other problems such as infection or heavy bleeding. In other cases, this condition may be treated with:  Dilation and curettage (D&C). In this procedure, the cervix is stretched open and any remaining pregnancy tissue is removed from the lining of the uterus (endometrium).  Medicines. These may include: ? Antibiotic medicine, to treat infection. ? Medicine to help any remaining pregnancy tissue come out of the body. ? Medicine to reduce (contract) the size of the uterus. These medicines may be given if there is a lot of bleeding. If you have Rh-negative blood, you may be given an injection of a medicine called Rho(D) immune globulin. This medicine helps prevent problems with future pregnancies. Follow these instructions at home: Medicines  Take over-the-counter and prescription medicines only as told by your health care provider.  If you were prescribed antibiotic medicine, take it as told by your health care provider. Do not stop taking the antibiotic even if you start to feel better. Activity  Rest as told by your health care provider. Ask your health care provider what activities are safe for you.  Have someone help with home and family responsibilities during this time. General instructions  Monitor how much tissue   or blood clot material comes out of the vagina.  Do not have sex, douche, or put anything, such as tampons, in your vagina until your health care provider says it is okay.  To help you and your partner with the grieving process, talk with your  health care provider or get counseling.  When you are ready, meet with your health care provider to discuss any important steps you should take for your health. Also, discuss steps you should take to have a healthy pregnancy in the future.  Keep all follow-up visits. This is important.   Where to find more information  The American College of Obstetricians and Gynecologists: acog.org  [place] Department of Health and Human Services Office of Women's Health: hrsa.gov/office-womens-health Contact a health care provider if:  You have a fever or chills.  There is bad-smelling fluid coming from the vagina.  You have more bleeding instead of less.  Tissue or blood clots come out of your vagina. Get help right away if:  You have severe cramps or pain in your back or abdomen.  Heavy bleeding soaks through 2 large sanitary pads an hour for more than 2 hours.  You become light-headed or weak.  You faint.  You feel sad, and your sadness takes over your thoughts.  You think about hurting yourself. If you ever feel like you may hurt yourself or others, or have thoughts about taking your own life, get help right away. Go to your nearest emergency department or:  Call your local emergency services (911 in the [place]  Call a suicide crisis helpline, such as the National Suicide Prevention Lifeline at 1-800-273-8255. This is open 24 hours a day in the [place]  Text the Crisis Text Line at 741741 (in the [place] Summary  Most miscarriages happen in the first 3 months of pregnancy. Sometimes miscarriage happens before a woman knows that she is pregnant.  Follow instructions from your health care provider about medicines and activity.  To help you and your partner with grieving, talk with your health care provider or get counseling.  Keep all follow-up visits. This information is not intended to replace advice given to you by your health care provider. Make sure you discuss any questions you  have with your health care provider. Document Revised: 03/19/2020 Document Reviewed: 03/19/2020 [employer]vier Patient Education  2021 [employer]  

## 2020-12-15 NOTE — MAU Provider Note (Addendum)
Chief Complaint: Vaginal Bleeding   Event Date/Time   First Provider Initiated Contact with Patient 12/15/20 2057        SUBJECTIVE HPI: [name] is a 35 y.o. G2P0101 at [redacted]w[redacted]d by LMP who presents to maternity admissions reporting cramping last week (resolved) and new spotting today.  Also had a headache today.  Pt has hypertension, but she states only when pregnant.  Has Type 2 diabetes.  Not taking any meds for it now.  She denies vaginal itching/burning, urinary symptoms, h/a, dizziness, n/v, or fever/chills.    Vaginal Bleeding The patient's primary symptoms include vaginal bleeding. The patient's pertinent negatives include no genital itching, genital lesions, genital odor or pelvic pain. This is a new problem. The current episode started today. The problem has been unchanged. She is pregnant. Associated symptoms include abdominal pain (last week, none now) and headaches. Pertinent negatives include no chills, fever, frequency, nausea or vomiting. The vaginal discharge was bloody. The vaginal bleeding is spotting. She has not been passing clots. She has not been passing tissue. Nothing aggravates the symptoms. She has tried nothing for the symptoms.   RN note: I am [redacted]wks pregnant. Had cramping last wk and it went away. Today awoke with headache and saw some pink  vag spotting which has increased some as the day has gone by. Pt states she only has HTN when she is pregnant. No meds for HTN.  There is documented Hypertension outside pregnancy on note from 03/30/16 (157/108)   No headache now and no other pain. LMP 10/07/20  Past Medical History:  Diagnosis Date   Diabetes mellitus without complication (HCC)    Type II - gestational with meds   Gestational diabetes    Hypertension    Past Surgical History:  Procedure Laterality Date   addnoids     CESAREAN SECTION N/A 12/25/2018   Procedure: CESAREAN SECTION;  Surgeon: Levie Heritage, DO;  Location: [facility];  Service: Obstetrics;   Laterality: N/A;   TONSILLECTOMY AND ADENOIDECTOMY     Social History   Socioeconomic History   Marital status: Single    Spouse name: Not on file   Number of children: Not on file   Years of education: Not on file   Highest education level: Not on file  Occupational History   Not on file  Tobacco Use   Smoking status: Former Smoker    Years: 0.00    Quit date: 03/30/2006    Years since quitting: 14.7   Smokeless tobacco: Never Used  Vaping Use   Vaping Use: Never used  Substance and Sexual Activity   Alcohol use: No   Drug use: No   Sexual activity: Yes    Birth control/protection: None  Other Topics Concern   Not on file  Social History Narrative   Not on file   Social Determinants of Health   Financial Resource Strain: Not on file  Food Insecurity: Not on file  Transportation Needs: Not on file  Physical Activity: Not on file  Stress: Not on file  Social Connections: Not on file  Intimate Partner Violence: Not on file   No current facility-administered medications on file prior to encounter.   Current Outpatient Medications on File Prior to Encounter  Medication Sig Dispense Refill   Prenatal Multivit-Min-Fe-FA (PRENATAL VITAMINS PO) Take 1 tablet by mouth daily.      hydrochlorothiazide (HYDRODIURIL) 25 MG tablet Take 1 tablet (25 mg total) by mouth daily. 30 tablet 1  insulin glargine (LANTUS) 100 UNIT/ML injection Inject 0.25 mLs (25 Units total) into the skin 2 (two) times daily. 10 mL 11   Norethindrone Acetate-Ethinyl Estrad-FE (LOESTRIN 24 FE) 1-20 MG-MCG(24) tablet Take 1 tablet by mouth daily. 3 Package 3   Allergies  Allergen Reactions   Almond (Diagnostic) Hives   Bee Venom Hives    I have reviewed patient's Past Medical Hx, Surgical Hx, Family Hx, Social Hx, medications and allergies.   ROS:  Review of Systems  Constitutional: Negative for chills and fever.  Gastrointestinal: Positive for abdominal pain (last week, none now). Negative for  nausea and vomiting.  Genitourinary: Positive for vaginal bleeding. Negative for frequency and pelvic pain.  Neurological: Positive for headaches.   Review of Systems  Other systems negative   Physical Exam  Physical Exam Vitals:   12/15/20 2014 12/15/20 2056  BP: (!) 165/104 (!) 151/96  Pulse: (!) 111   Resp: 16   Temp: 98.2 F (36.8 C)   Weight: 97.5 kg   Height: 5\' 6"  (1.676 m)     Constitutional: Well-developed, well-nourished female in no acute distress.  Cardiovascular: normal rate Respiratory: normal effort GI: Abd soft, non-tender. No rebound or guarding MS: Extremities nontender, no edema, normal ROM Neurologic: Alert and oriented x 4.  GU: Neg CVAT.         No red bleeding  LAB RESULTS Results for orders placed or performed during the hospital encounter of 12/15/20 (from the past 24 hour(s))  Urinalysis, Routine w reflex microscopic Urine, Clean Catch     Status: Abnormal   Collection Time: 12/15/20  8:20 PM  Result Value Ref Range   Color, Urine STRAW (A) YELLOW   APPearance CLEAR CLEAR   Specific Gravity, Urine 1.027 1.005 - 1.030   pH 5.0 5.0 - 8.0   Glucose, UA >=500 (A) NEGATIVE mg/dL   Hgb urine dipstick NEGATIVE NEGATIVE   Bilirubin Urine NEGATIVE NEGATIVE   Ketones, ur 5 (A) NEGATIVE mg/dL   Protein, ur >=161>=300 (A) NEGATIVE mg/dL   Nitrite NEGATIVE NEGATIVE   Leukocytes,Ua NEGATIVE NEGATIVE   RBC / HPF 0-5 0 - 5 RBC/hpf   WBC, UA 0-5 0 - 5 WBC/hpf   Bacteria, UA RARE (A) NONE SEEN   Squamous Epithelial / LPF 0-5 0 - 5   Mucus PRESENT   Pregnancy, urine POC     Status: Abnormal   Collection Time: 12/15/20  8:29 PM  Result Value Ref Range   Preg Test, Ur POSITIVE (A) NEGATIVE  CBC     Status: Abnormal   Collection Time: 12/15/20  9:28 PM  Result Value Ref Range   WBC 8.7 4.0 - 10.5 K/uL   RBC 4.45 3.87 - 5.11 MIL/uL   Hemoglobin 13.5 12.0 - 15.0 g/dL   HCT 09.637.5 04.536.0 - 40.946.0 %   MCV 84.3 80.0 - 100.0 fL   MCH 30.3 26.0 - 34.0 pg   MCHC 36.0  30.0 - 36.0 g/dL   RDW 81.112.4 91.411.5 - 78.215.5 %   Platelets 418 (H) 150 - 400 K/uL   nRBC 0.0 0.0 - 0.2 %  hCG, quantitative, pregnancy     Status: Abnormal   Collection Time: 12/15/20  9:28 PM  Result Value Ref Range   hCG, Beta Chain, Quant, S 11,857 (H) <5 mIU/mL  Basic metabolic panel     Status: Abnormal   Collection Time: 12/15/20  9:28 PM  Result Value Ref Range   Sodium 131 (L) 135 - 145 mmol/L  Potassium 4.0 3.5 - 5.1 mmol/L   Chloride 98 98 - 111 mmol/L   CO2 26 22 - 32 mmol/L   Glucose, Bld 353 (H) 70 - 99 mg/dL   BUN 11 6 - 20 mg/dL   Creatinine, Ser 7.26 0.44 - 1.00 mg/dL   Calcium 9.0 8.9 - 20.3 mg/dL   GFR, Estimated >55 >97 mL/min   Anion gap 7 5 - 15  Wet prep, genital     Status: Abnormal   Collection Time: 12/15/20  9:50 PM   Specimen: PATH Cytology Cervicovaginal Ancillary Only  Result Value Ref Range   Yeast Wet Prep HPF POC NONE SEEN NONE SEEN   Trich, Wet Prep NONE SEEN NONE SEEN   Clue Cells Wet Prep HPF POC PRESENT (A) NONE SEEN   WBC, Wet Prep HPF POC FEW (A) NONE SEEN   Sperm NONE SEEN     IMAGING US OB Comp Less 14 Wks  Result Date: 12/15/2020 CLINICAL DATA:  Vaginal bleeding. EXAM: OBSTETRIC <14 WK ULTRASOUND TECHNIQUE: Transabdominal ultrasound was performed for evaluation of the gestation as well as the maternal uterus and adnexal regions. COMPARISON:  None. FINDINGS: Intrauterine gestational sac: Single Yolk sac:  Visualized. Embryo:  Visualized. Cardiac Activity: Not Visualized. Heart Rate: N/A bpm CRL:   9.7 mm   7 w 0 d                  [place] EDC: August 03, 2021 Subchorionic hemorrhage:  None visualized. Maternal uterus/adnexae: The bilateral ovaries are visualized and are normal in appearance. IMPRESSION: Single, nonviable intrauterine pregnancy at approximately 7 weeks and 0 days gestation by ultrasound evaluation. Electronically Signed   By: Aram Candela M.D.   On: 12/15/2020 21:46   US OB Transvaginal  Result Date: 12/15/2020 CLINICAL DATA:   Vaginal bleeding. EXAM: OBSTETRIC <14 WK [place] AND TRANSVAGINAL OB US TECHNIQUE: Both transabdominal and transvaginal ultrasound examinations were performed for complete evaluation of the gestation as well as the maternal uterus, adnexal regions, and pelvic cul-de-sac. Transvaginal technique was performed to assess early pregnancy. COMPARISON:  None. FINDINGS: Intrauterine gestational sac: Single Yolk sac:  Visualized. Embryo:  Visualized. Cardiac Activity: Not Visualized. Heart Rate: N/A bpm CRL:   9.7 mm   7 w 0 d                  [place] EDC: August 03, 2021 Subchorionic hemorrhage:  None visualized. Maternal uterus/adnexae: The bilateral ovaries are visualized and are normal in appearance. IMPRESSION: Single, nonviable intrauterine pregnancy at approximately 7 weeks and 0 days gestation by ultrasound evaluation. Electronically Signed   By: Aram Candela M.D.   On: 12/15/2020 21:47     MAU Management/MDM: Ordered usual first trimester r/o ectopic labs.   Pelvic exam and cultures done Will check baseline Ultrasound to rule out ectopic.  US showed embryo at 7 wks size with no heartbeat.   Consult Dr Charlotta Newton with presentation, exam findings, and results.  She recommends prescribing Metformin 1073m bid with good followup   This bleeding/pain can represent a normal pregnancy with bleeding, spontaneous abortion or even an ectopic which can be life-threatening.  The process as listed above helps to determine which of these is present.   Reviewed US findings with patient who is grieving Reviewed options of expectant management, cytotec or D&C  She states she does not want to go through all that is involved with SAB/cytotec.  Wants to have a D&C  Discussed will refer to scheduler  and would be done as an outpatient surgery.  SAB precautions.  WIll message office to change next week's appt with Dr Adrian Blackwater to one directed at [facility] and discussion of T2DM and HTN treatment  ASSESSMENT Single intrauterine pregnancy at  [redacted]w[redacted]d by LMP, 7 wk by US Fetal demise in utero Chronic Hypertension untreated Type 2 DM untreated  PLAN Discharge home Rx Metformin 1000mg  bid Followup in office next week Message sent to [facility] to arrange D&C   Pt stable at time of discharge. Encouraged to return here if she develops worsening of symptoms, increase in pain, fever, or other concerning symptoms.    [facility] CNM, MSN Certified Nurse-Midwife 12/15/2020  8:57 PM  Attestation of Attending Supervision of Advanced Practice Provider (PA/CNM/NP): Evaluation and management procedures were performed by the Advanced Practice Provider under my supervision and collaboration.  I have reviewed the Advanced Practice Provider's note and chart, and I agree with the management and plan. I have also made any necessary editorial changes.   12/17/2020, DO Attending Obstetrician & Gynecologist, [facility] for [facility], [facility] 12/16/2020 1:55 PM

## 2020-12-15 NOTE — MAU Note (Addendum)
I am [redacted]wks pregnant. Had cramping last wk and it went away. Today awoke with headache and saw some pink  vag spotting which has increased some as the day has gone by. Pt states she only has HTN when she is pregnant. No meds for HTN. No headache now and no other pain. LMP 10/07/20

## 2020-12-16 ENCOUNTER — Other Ambulatory Visit: Payer: Self-pay

## 2020-12-16 ENCOUNTER — Telehealth: Payer: Self-pay | Admitting: *Deleted

## 2020-12-16 ENCOUNTER — Encounter (HOSPITAL_BASED_OUTPATIENT_CLINIC_OR_DEPARTMENT_OTHER): Payer: Self-pay | Admitting: Obstetrics and Gynecology

## 2020-12-16 DIAGNOSIS — I10 Essential (primary) hypertension: Secondary | ICD-10-CM | POA: Insufficient documentation

## 2020-12-16 DIAGNOSIS — O021 Missed abortion: Secondary | ICD-10-CM | POA: Diagnosis present

## 2020-12-16 LAB — GC/CHLAMYDIA PROBE AMP (~~LOC~~) NOT AT ARMC
Chlamydia: NEGATIVE
Comment: NEGATIVE
Comment: NORMAL
Neisseria Gonorrhea: NEGATIVE

## 2020-12-16 NOTE — Telephone Encounter (Signed)
Call to patient. Advised D&E scheduled for Tuesday, 12-21-20 at 1000 at [facility]. Arrive 0800. Advised [name] will receive call from surgery center with additional instructions and Covid testing. Advised NPO after midnight, driver required.  Patient voiced understanding and agreeable. Support given.  Declined My Chart letter since [name] is unable to log in.   Encounter closed.

## 2020-12-16 NOTE — Progress Notes (Addendum)
ADDENDUM:  Noted in epic Dr Kirtland Bouchard. Earlene Plater made progress note in epic and sent in procardia medication to pt's pharmacy today.  Called and spoke w/ pt via phone informed her Dr Earlene Plater sent her bp medication to her pharmacy.  Pt verbalized understanding to started taking the medication as prescription when she picks up rx and if she takes in am is to take morning of surgery.   Spoke w/ via phone for pre-op interview--- PT Lab needs dos---- Istat and EKG (per anes)/  Pre-op orders pending             Lab results------ pt had CBC/ BMP done 12-15-2020 results in epic COVID test ------ 12-20-2020 @ 0855 Arrive at ------- 0815 on 12-21-2020 NPO after MN NO Solid Food.  Clear liquids from MN until--- 0715 Med rec completed Medications to take morning of surgery ----- NONE Diabetic medication ----- do not take metformin morning of surgery Patient instructed to bring photo id and insurance card day of surgery Patient aware to have Driver (ride ) / caregiver    for 24 hours after surgery -- either her fiance, Albin Fischer or her aunt, Tristy Udovich (aware only one allowed) Patient Special Instructions ----- n/a Pre-Op special Istructions ----- pre-orders pending , case just added on today Patient verbalized understanding of instructions that were given at this phone interview. Patient denies shortness of breath, chest pain, fever, cough at this phone interview.  Anesthesia:  Pt at MAU 12-15-2020;  bp 165/ 104 and blood glucose 353.  Noted pt given prescription for DM, metformin, but was not given anything for HTN.  Per pt was told was going to be given rx for bp but when she went to pick-up at her pharmacy only metformin was called in.  I called and left message on Dr Kirtland Bouchard. [place] cell , informed her that pt needs bp medication since pt did not receive rx yesterday.  Will await call back or watch in epic for note for further order or instruction. Informed pt that she should receive call from the dr or dr's nurse and  will call her back on Monday 12-20-2020 to let her know if need to take bp med am dos or not.

## 2020-12-17 ENCOUNTER — Other Ambulatory Visit: Payer: Self-pay | Admitting: Obstetrics and Gynecology

## 2020-12-17 MED ORDER — NIFEDIPINE ER OSMOTIC RELEASE 30 MG PO TB24: 30.0000 mg | ORAL_TABLET | Freq: Every day | ORAL | 2 refills | Status: DC

## 2020-12-17 NOTE — Progress Notes (Signed)
Procardia sent to pharmacy per message from pre-op RN.

## 2020-12-20 ENCOUNTER — Other Ambulatory Visit (HOSPITAL_COMMUNITY)
Admission: RE | Admit: 2020-12-20 | Discharge: 2020-12-20 | Disposition: A | Discharge status: Home / Self Care | Payer: Medicaid Other | Source: Ambulatory Visit | Attending: Obstetrics and Gynecology | Admitting: Obstetrics and Gynecology

## 2020-12-20 ENCOUNTER — Other Ambulatory Visit: Payer: Self-pay | Admitting: Obstetrics and Gynecology

## 2020-12-20 DIAGNOSIS — Z20822 Contact with and (suspected) exposure to covid-19: Secondary | ICD-10-CM | POA: Diagnosis not present

## 2020-12-20 DIAGNOSIS — Z01812 Encounter for preprocedural laboratory examination: Secondary | ICD-10-CM | POA: Diagnosis present

## 2020-12-20 LAB — SARS CORONAVIRUS 2 (TAT 6-24 HRS): SARS Coronavirus 2: NEGATIVE

## 2020-12-20 MED ORDER — DOXYCYCLINE HYCLATE 100 MG IV SOLR
200.0000 mg | INTRAVENOUS | Status: DC
  Filled 2020-12-20 (×2): qty 200

## 2020-12-20 MED ORDER — METRONIDAZOLE 500 MG PO TABS: 500.0000 mg | ORAL_TABLET | Freq: Two times a day (BID) | ORAL | 0 refills | Status: DC

## 2020-12-20 NOTE — Progress Notes (Signed)
Flagyl sent to pharmacy

## 2020-12-20 NOTE — Progress Notes (Signed)
Left voicemail on Dr. Earlene Plater' phone requesting orders for patient for tomorrow.

## 2020-12-21 ENCOUNTER — Ambulatory Visit (HOSPITAL_BASED_OUTPATIENT_CLINIC_OR_DEPARTMENT_OTHER)
Admission: RE | Admit: 2020-12-21 | Discharge: 2020-12-21 | Disposition: A | Discharge status: Home / Self Care | Payer: Medicaid Other | Attending: Obstetrics and Gynecology | Admitting: Obstetrics and Gynecology

## 2020-12-21 ENCOUNTER — Encounter (HOSPITAL_BASED_OUTPATIENT_CLINIC_OR_DEPARTMENT_OTHER): Payer: Self-pay | Admitting: Anesthesiology

## 2020-12-21 ENCOUNTER — Encounter (HOSPITAL_BASED_OUTPATIENT_CLINIC_OR_DEPARTMENT_OTHER)
Admission: RE | Disposition: A | Discharge status: Home / Self Care | Payer: Self-pay | Source: Home / Self Care | Attending: Obstetrics and Gynecology

## 2020-12-21 DIAGNOSIS — Z3A01 Less than 8 weeks gestation of pregnancy: Secondary | ICD-10-CM | POA: Diagnosis not present

## 2020-12-21 DIAGNOSIS — O021 Missed abortion: Secondary | ICD-10-CM | POA: Diagnosis not present

## 2020-12-21 HISTORY — DX: Presence of spectacles and contact lenses: Z97.3

## 2020-12-21 HISTORY — DX: Type 2 diabetes mellitus without complications: E11.9

## 2020-12-21 HISTORY — DX: Essential (primary) hypertension: I10

## 2020-12-21 LAB — HCG, QUANTITATIVE, PREGNANCY: hCG, Beta Chain, Quant, S: 375 m[IU]/mL — ABNORMAL HIGH (ref <5)

## 2020-12-21 SURGERY — DILATION AND EVACUATION, UTERUS
Anesthesia: Choice

## 2020-12-21 MED ORDER — LACTATED RINGERS IV SOLN: INTRAVENOUS | Status: DC

## 2020-12-21 MED ORDER — POVIDONE-IODINE 10 % EX SWAB: 2.0000 "application " | Freq: Once | CUTANEOUS | Status: DC

## 2020-12-21 MED ORDER — SOD CITRATE-CITRIC ACID 500-334 MG/5ML PO SOLN: 30.0000 mL | ORAL | Status: DC

## 2020-12-21 SURGICAL SUPPLY — 20 items
CATH ROBINSON RED A/P 16FR (CATHETERS) ×2 IMPLANT
DECANTER SPIKE VIAL GLASS SM (MISCELLANEOUS) ×2 IMPLANT
GLOVE SURG ENC MOIS LTX SZ6.5 (GLOVE) ×2 IMPLANT
GLOVE SURG UNDER POLY LF SZ6.5 (GLOVE) ×2 IMPLANT
GLOVE SURG UNDER POLY LF SZ7 (GLOVE) ×2 IMPLANT
GOWN STRL REUS W/ TWL LRG LVL3 (GOWN DISPOSABLE) ×2 IMPLANT
GOWN STRL REUS W/TWL LRG LVL3 (GOWN DISPOSABLE) ×2
HIBICLENS CHG 4% 4OZ BTL (MISCELLANEOUS) ×2 IMPLANT
KIT BERKELEY 1ST TRI 3/8 NO TR (MISCELLANEOUS) ×2 IMPLANT
KIT BERKELEY 1ST TRIMESTER 3/8 (MISCELLANEOUS) ×2 IMPLANT
NS IRRIG 1000ML POUR BTL (IV SOLUTION) ×2 IMPLANT
PACK VAGINAL MINOR WOMEN LF (CUSTOM PROCEDURE TRAY) ×2 IMPLANT
PAD OB MATERNITY 4.3X12.25 (PERSONAL CARE ITEMS) ×2 IMPLANT
PAD PREP 24X48 CUFFED NSTRL (MISCELLANEOUS) ×2 IMPLANT
SET BERKELEY SUCTION TUBING (SUCTIONS) ×2 IMPLANT
TOWEL OR 17X26 10 PK STRL BLUE (TOWEL DISPOSABLE) ×4 IMPLANT
VACURETTE 10 RIGID CVD (CANNULA) IMPLANT
VACURETTE 7MM CVD STRL WRAP (CANNULA) IMPLANT
VACURETTE 8 RIGID CVD (CANNULA) IMPLANT
VACURETTE 9 RIGID CVD (CANNULA) IMPLANT

## 2020-12-21 NOTE — H&P (Signed)
OB/GYN Pre-Op History and Physical  [name] is a 35 y.o. G2P0101 presenting for surgical management of missed abortion at 10 weeks by dates, 7 weeks by size. She reports she presented to MAU last week with spotting, was told she had a miscarriage and was scheduled for surgery. Bleeding continued until Friday morning, when she started having a significant amount of bleeding and cramping and passed large blood clots and possible tissue. Bleeding has lightened significantly since then and she is having light spotting now. States she thinks she had the miscarriage. Did not know to call the office.       Past Medical History:  Diagnosis Date  . Chronic hypertension    12-16-2020  documented in epic since 2017 pt dx hypertension (not during pregnancy);  pt currently is not and has not been taking any bp med,  bp at [facility] 12-15-2020 165/ 104  . Missed ab 12/15/2020  . Type 2 diabetes mellitus (HCC)    12-16-2020  pt stated only during pregnancy took medication, at MAU 12-15-2020 blood glucose 353 pt was given metformin bid and pt stated did start taking it  . Wears glasses     Past Surgical History:  Procedure Laterality Date  . CESAREAN SECTION N/A 12/25/2018   Procedure: CESAREAN SECTION;  Surgeon: Levie Heritage, DO;  Location: [facility] LD ORS;  Service: Obstetrics;  Laterality: N/A;  . TONSILLECTOMY AND ADENOIDECTOMY  age 6    OB History  Gravida Para Term Preterm AB Living  2 1 0 1 0 1  SAB IAB Ectopic Multiple Live Births  0 0 0 0 1    # Outcome Date GA Lbr Len/2nd Weight Sex Delivery Anes PTL Lv  2 Current           1 Preterm 12/25/18 [redacted]w[redacted]d  2722 g M CS-LTranv        Complications: Failure to Progress in First Stage    Social History   Socioeconomic History  . Marital status: Single    Spouse name: Not on file  . Number of children: Not on file  . Years of education: Not on file  . Highest education level: Not on file  Occupational History  . Not on file  Tobacco Use   . Smoking status: Never Smoker  . Smokeless tobacco: Never Used  Vaping Use  . Vaping Use: Never used  Substance and Sexual Activity  . Alcohol use: No  . Drug use: Never  . Sexual activity: Yes    Birth control/protection: None  Other Topics Concern  . Not on file  Social History Narrative  . Not on file   Social Determinants of Health   Financial Resource Strain: Not on file  Food Insecurity: Not on file  Transportation Needs: Not on file  Physical Activity: Not on file  Stress: Not on file  Social Connections: Not on file    Family History  Problem Relation Age of Onset  . Diabetes Mother   . Hypertension Mother   . Hypertension Maternal Grandmother   . Diabetes Maternal Aunt   . Diabetes Maternal Uncle     Medications Prior to Admission  Medication Sig Dispense Refill Last Dose  . metFORMIN (GLUCOPHAGE) 1000 MG tablet Take 1 tablet (1,000 mg total) by mouth 2 (two) times daily. (Patient taking differently: Take 1,000 mg by mouth 2 (two) times daily.) 30 tablet 0   . Prenatal Multivit-Min-Fe-FA (PRENATAL VITAMINS PO) Take 1 tablet by mouth daily.      Marland Kitchen  metroNIDAZOLE (FLAGYL) 500 MG tablet Take 1 tablet (500 mg total) by mouth 2 (two) times daily. 14 tablet 0   . NIFEdipine (PROCARDIA-XL/NIFEDICAL-XL) 30 MG 24 hr tablet Take 1 tablet (30 mg total) by mouth daily. Can increase to twice a day as needed for symptomatic contractions 30 tablet 2     Allergies  Allergen Reactions  . Almond (Diagnostic) Hives  . Bee Venom Hives    Review of Systems: Negative except for what is mentioned in HPI.     Physical Exam: Ht 5\' 6"  (1.676 m)   Wt 97.5 kg   LMP 10/07/2020   BMI 34.69 kg/m  CONSTITUTIONAL: Well-developed, well-nourished female in no acute distress.  HENT:  Normocephalic, atraumatic, External right and left ear normal. Oropharynx is clear and moist EYES: Conjunctivae and EOM are normal. Pupils are equal, round, and reactive to light. No scleral icterus.   NECK: Normal range of motion, supple, no masses SKIN: Skin is warm and dry. No rash noted. Not diaphoretic. No erythema. No pallor. NEUROLGIC: Alert and oriented to person, place, and time. Normal reflexes, muscle tone coordination. No cranial nerve deficit noted. PSYCHIATRIC: Normal mood and affect. Normal behavior. Normal judgment and thought content. CARDIOVASCULAR: Normal heart rate noted RESPIRATORY: Effort normal, no problems with respiration noted ABDOMEN: Soft, nontender, nondistended, Well-healed Pfannenstiel incision. PELVIC: Deferred MUSCULOSKELETAL: Normal range of motion. No edema and no tenderness. 2+ distal pulses.   Pertinent Labs/Studies:   Results for orders placed or performed during the hospital encounter of 12/20/20 (from the past 72 hour(s))  SARS CORONAVIRUS 2 (TAT 6-24 HRS) Nasopharyngeal Nasopharyngeal Swab     Status: None   Collection Time: 12/20/20  9:39 AM   Specimen: Nasopharyngeal Swab  Result Value Ref Range   SARS Coronavirus 2 NEGATIVE NEGATIVE    Comment: (NOTE) SARS-CoV-2 target nucleic acids are NOT DETECTED.  The SARS-CoV-2 RNA is generally detectable in upper and lower respiratory specimens during the acute phase of infection. Negative results do not preclude SARS-CoV-2 infection, do not rule out co-infections with other pathogens, and should not be used as the sole basis for treatment or other patient management decisions. Negative results must be combined with clinical observations, patient history, and epidemiological information. The expected result is Negative.  Fact Sheet for Patients: 12/22/20  Fact Sheet for Healthcare Providers: HairSlick.no  This test is not yet approved or cleared by the quierodirigir.com FDA and  has been authorized for detection and/or diagnosis of SARS-CoV-2 by FDA under an Emergency Use Authorization (EUA). This EUA will remain  in effect (meaning  this test can be used) for the duration of the COVID-19 declaration under Se ction 564(b)(1) of the Act, 21 U.S.C. section 360bbb-3(b)(1), unless the authorization is terminated or revoked sooner.  Performed at [facility], [address] [place], [place]    CLINICAL DATA:  Vaginal bleeding.  EXAM: OBSTETRIC <14 WK 22979 AND TRANSVAGINAL OB US  TECHNIQUE: Both transabdominal and transvaginal ultrasound examinations were performed for complete evaluation of the gestation as well as the maternal uterus, adnexal regions, and pelvic cul-de-sac. Transvaginal technique was performed to assess early pregnancy.  COMPARISON:  None.  FINDINGS: Intrauterine gestational sac: Single  Yolk sac:  Visualized.  Embryo:  Visualized.  Cardiac Activity: Not Visualized.  Heart Rate: N/A bpm  CRL:   9.7 mm   7 w 0 d                  [place] EDC:  August 03, 2021  Subchorionic hemorrhage:  None visualized.  Maternal uterus/adnexae: The bilateral ovaries are visualized and are normal in appearance.  IMPRESSION: Single, nonviable intrauterine pregnancy at approximately 7 weeks and 0 days gestation by ultrasound evaluation.   Electronically Signed   By: Aram Candela M.D.   On: 12/15/2020 21:47     Assessment and Plan :[name] is a 35 y.o. G2P0101 here for surgical management of missed abortion. Patient reports miscarriage completed on Friday. I reviewed that her story is characteristic of complete abortion, and she likely passed all tissue within the clots based on the amount of bleeding and cramping she reports.   Recommend we do not proceed with surgery today, given likely completed abortion and risks of surgery. Recommend that we will collect HCG today and trend. Reviewed that if she has not passed all tissue/continues to bleed, she may need to be rescheduled for surgery. She is in agreement with this plan. Reviewed post miscarriage expectations and to  follow up in office with scheduled appt for next week.  She verbalizes understanding of plan and is in agreement.   Discharge to home HCG pending F/u in office I will message her with results   K. Therese Sarah, M.D. Attending Obstetrician & Gynecologist, [facility] for [employer], [facility]

## 2020-12-23 ENCOUNTER — Encounter: Payer: Medicaid Other | Admitting: Family Medicine

## 2020-12-31 ENCOUNTER — Ambulatory Visit (INDEPENDENT_AMBULATORY_CARE_PROVIDER_SITE_OTHER): Payer: Medicaid Other | Admitting: Family Medicine

## 2020-12-31 ENCOUNTER — Other Ambulatory Visit: Payer: Self-pay

## 2020-12-31 ENCOUNTER — Encounter: Payer: Self-pay | Admitting: Family Medicine

## 2020-12-31 VITALS — BP 127/78 | HR 104 | Wt 213.0 lb

## 2020-12-31 DIAGNOSIS — O039 Complete or unspecified spontaneous abortion without complication: Secondary | ICD-10-CM

## 2020-12-31 DIAGNOSIS — Z7189 Other specified counseling: Secondary | ICD-10-CM | POA: Diagnosis not present

## 2020-12-31 NOTE — Progress Notes (Signed)
   Subjective:     Patient Name: [name], female   DOB: May 26, 1986, 35 y.o.  MRN: [id number]  HPI Patient seen for follow up of SAB, which she completed about 9-10 days ago. Not having any further bleeding. Still quite emotional about the loss. No cramping.   Review of Systems     Objective:   Physical Exam Vitals and nursing note reviewed.  Constitutional:      Appearance: Normal appearance.  Cardiovascular:     Rate and Rhythm: Normal rate and regular rhythm.     Pulses: Normal pulses.  Skin:    General: Skin is warm and dry.     Capillary Refill: Capillary refill takes less than 2 seconds.  Neurological:     General: No focal deficit present.     Mental Status: She is alert.  Psychiatric:        Mood and Affect: Mood normal.        Behavior: Behavior normal.        Thought Content: Thought content normal.        Judgment: Judgment normal.       Assessment & Plan:  1. Grief counseling Discussed treatment with counseling vs medication vs both. Would like counseling for the moment. Patient will call if she feels like things are getting worse. - Ambulatory referral to Social Work  2. SAB (spontaneous abortion) No bleeding. Unsure if she will try to get pregnant soon. Discussed waiting 2-3 months prior to attempting.

## 2021-01-12 ENCOUNTER — Other Ambulatory Visit: Payer: Self-pay

## 2021-01-12 DIAGNOSIS — Z7189 Other specified counseling: Secondary | ICD-10-CM

## 2021-01-12 DIAGNOSIS — O039 Complete or unspecified spontaneous abortion without complication: Secondary | ICD-10-CM

## 2021-01-12 MED ORDER — FLUCONAZOLE 150 MG PO TABS: 150.0000 mg | ORAL_TABLET | Freq: Once | ORAL | 0 refills | Status: AC

## 2021-09-08 ENCOUNTER — Other Ambulatory Visit: Payer: Self-pay | Admitting: Family Medicine

## 2021-09-08 DIAGNOSIS — Z7189 Other specified counseling: Secondary | ICD-10-CM

## 2021-09-19 DIAGNOSIS — E871 Hypo-osmolality and hyponatremia: Secondary | ICD-10-CM | POA: Insufficient documentation

## 2021-09-19 DIAGNOSIS — E119 Type 2 diabetes mellitus without complications: Secondary | ICD-10-CM | POA: Insufficient documentation

## 2021-09-20 DIAGNOSIS — N179 Acute kidney failure, unspecified: Secondary | ICD-10-CM | POA: Insufficient documentation

## 2021-09-22 DIAGNOSIS — N170 Acute kidney failure with tubular necrosis: Secondary | ICD-10-CM | POA: Insufficient documentation

## 2021-09-22 DIAGNOSIS — D62 Acute posthemorrhagic anemia: Secondary | ICD-10-CM | POA: Insufficient documentation

## 2021-09-26 DIAGNOSIS — R809 Proteinuria, unspecified: Secondary | ICD-10-CM | POA: Insufficient documentation

## 2021-09-26 DIAGNOSIS — E8809 Other disorders of plasma-protein metabolism, not elsewhere classified: Secondary | ICD-10-CM | POA: Insufficient documentation

## 2021-10-02 DIAGNOSIS — E876 Hypokalemia: Secondary | ICD-10-CM | POA: Insufficient documentation

## 2021-10-05 DIAGNOSIS — M726 Necrotizing fasciitis: Secondary | ICD-10-CM | POA: Insufficient documentation

## 2021-10-12 DIAGNOSIS — T8789 Other complications of amputation stump: Secondary | ICD-10-CM | POA: Insufficient documentation

## 2021-11-30 ENCOUNTER — Encounter: Payer: Self-pay | Admitting: Nurse Practitioner

## 2021-11-30 ENCOUNTER — Ambulatory Visit (INDEPENDENT_AMBULATORY_CARE_PROVIDER_SITE_OTHER): Payer: Medicaid Other | Admitting: Nurse Practitioner

## 2021-11-30 VITALS — BP 125/86 | HR 107 | Temp 98.8°F | Ht 67.01 in | Wt 237.0 lb

## 2021-11-30 DIAGNOSIS — E1165 Type 2 diabetes mellitus with hyperglycemia: Secondary | ICD-10-CM | POA: Diagnosis not present

## 2021-11-30 DIAGNOSIS — Z7689 Persons encountering health services in other specified circumstances: Secondary | ICD-10-CM | POA: Diagnosis not present

## 2021-11-30 NOTE — Assessment & Plan Note (Signed)
Patient will return in 2 weeks to recheck CBC, CMP, A1c.  We discussed on the need to keep blood glucose under 7 for therapeutic A1c.  Patient discontinued metformin and currently on insulin sliding scale.  We will discuss further adding medication to current regimen and continue to monitor A1c.  Educated patient to keep a blood glucose log.  Return in 3 months. ?

## 2021-11-30 NOTE — Progress Notes (Signed)
. ? ? ? ?New Patient Note ? ?RE: [name] MRN: [id number] DOB: 04-23-1986 ?Date of Office Visit: 11/30/2021 ? ?Chief Complaint: New Patient (Initial Visit) (Establish care) ? ?History of Present Illness: ?Patient is a 36 year old female who presents to clinic to establish care.  History of type 2 diabetes mellitus and recently had a right below the knee amputation. ? ?Diabetes Mellitus Type II, Follow-up: Patient here for follow-up of Type 2 diabetes mellitus.  Current symptoms/problems include hyperglycemia and have been stable. Symptoms have been present for a few years. ? ?Known diabetic complications:  Recent below the knee right leg amputation ?Cardiovascular risk factors: obesity (BMI >= 30 kg/m2) ?Current diabetic medications include insulin injections: Lantus : 100 u/ml and NovoLog FlexPen 100 units.  mL .  ? ?Eye exam current (within one year): no ?Weight trend: stable ?Prior visit with dietician: no ?Current diet: diabetic ?Current exercise: none ? ?Current monitoring regimen: home blood tests - 3 times daily ?Home blood sugar records:  Patient did not bring a log to visit today ?Any episodes of hypoglycemia? no ? ?Is She on ACE inhibitor or angiotensin II receptor blocker?  ?No  ?No ?No   ? ?Assessment and Plan: ?[name] is a 36 y.o. female with: ?Type 2 diabetes mellitus with hyperglycemia (HCC) ?Patient will return in 2 weeks to recheck CBC, CMP, A1c.  We discussed on the need to keep blood glucose under 7 for therapeutic A1c.  Patient discontinued metformin and currently on insulin sliding scale.  We will discuss further adding medication to current regimen and continue to monitor A1c.  Educated patient to keep a blood glucose log.  Return in 3 months. ? ?Establishing care with new doctor, encounter for ?Patient is new to practice establishing care.  Education provided on health tests and preventative care.  Patient will return for complete physical and labs. ? ?Return in about 3 months (around  03/02/2022), or if symptoms worsen or fail to improve. ? ? ?Diagnostics: ? ? ?Past Medical History: ?Patient Active Problem List  ? Diagnosis Date Noted  ? Morbid obesity (HCC) 11/30/2021  ? Type 2 diabetes mellitus with hyperglycemia (HCC) 11/30/2021  ? Non-healing amputation site [facility]) 10/12/2021  ? Necrotizing fasciitis of ankle and foot (HCC) 10/05/2021  ? Hypokalemia 10/02/2021  ? Hypoalbuminemia 09/26/2021  ? Positive for macroalbuminuria 09/26/2021  ? Acute blood loss anemia 09/22/2021  ? ATN (acute tubular necrosis) (HCC) 09/22/2021  ? AKI (acute kidney injury) (HCC) 09/20/2021  ? Hyponatremia 09/19/2021  ? Diabetes mellitus type 2, noninsulin dependent (HCC) 09/19/2021  ? Chronic hypertension 12/16/2020  ? Missed abortion 12/16/2020  ? Status post cesarean delivery 12/15/2020  ? Establishing care with new doctor, encounter for 12/22/2018  ? Pap smear abnormality of cervix/human papillomavirus (HPV) positive 06/25/2018  ? BMI 33.0-33.9,adult 03/30/2016  ? ?Past Medical History:  ?Diagnosis Date  ? Chronic hypertension   ? 12-16-2020  documented in epic since 2017 pt dx hypertension (not during pregnancy);  pt currently is not and has not been taking any bp med,  bp at [facility] 12-15-2020 165/ 104  ? Missed ab 12/15/2020  ? Type 2 diabetes mellitus (HCC)   ? 12-16-2020  pt stated only during pregnancy took medication, at [facility] 12-15-2020 blood glucose 353 pt was given metformin bid and pt stated did start taking it  ? Wears glasses   ? ?Past Surgical History: ?Past Surgical History:  ?Procedure Laterality Date  ? BELOW KNEE LEG AMPUTATION Right   ? CESAREAN SECTION N/A  12/25/2018  ? Procedure: CESAREAN SECTION;  Surgeon: Levie Heritage, DO;  Location: [facility] LD ORS;  Service: Obstetrics;  Laterality: N/A;  ? TONSILLECTOMY AND ADENOIDECTOMY  age 48  ? ?Medication List:  ?Current Outpatient Medications  ?Medication Sig Dispense Refill  ? Blood Glucose Monitoring Suppl (GLUCOCOM BLOOD GLUCOSE MONITOR) DEVI See admin  instructions.    ? gabapentin (NEURONTIN) 300 MG capsule Take by mouth.    ? glucose blood (PRECISION QID TEST) test strip Use 4 (four) times daily    ? insulin glargine (LANTUS SOLOSTAR) 100 UNIT/ML Solostar Pen Inject into the skin.    ? magnesium oxide (MAG-OX) 400 MG tablet Take by mouth.    ? NIFEdipine (PROCARDIA-XL/NIFEDICAL-XL) 30 MG 24 hr tablet Take 1 tablet (30 mg total) by mouth daily. Can increase to twice a day as needed for symptomatic contractions 30 tablet 2  ? NOVOLOG FLEXPEN 100 UNIT/ML FlexPen SMARTSIG:7 Unit(s) SUB-Q 3 Times Daily    ? ?No current facility-administered medications for this visit.  ? ?Allergies: ?Allergies  ?Allergen Reactions  ? Almond Oil Hives  ? Almond (Diagnostic) Hives  ? Bee Venom Hives  ? ?Social History: ?Social History  ? ?Socioeconomic History  ? Marital status: Single  ?  Spouse name: Not on file  ? Number of children: Not on file  ? Years of education: Not on file  ? Highest education level: Not on file  ?Occupational History  ? Not on file  ?Tobacco Use  ? Smoking status: Never  ? Smokeless tobacco: Never  ?Vaping Use  ? Vaping Use: Never used  ?Substance and Sexual Activity  ? Alcohol use: No  ? Drug use: Never  ? Sexual activity: Yes  ?  Birth control/protection: None  ?Other Topics Concern  ? Not on file  ?Social History Narrative  ? Not on file  ? ?Social Determinants of Health  ? ?Financial Resource Strain: Not on file  ?Food Insecurity: Not on file  ?Transportation Needs: Not on file  ?Physical Activity: Not on file  ?Stress: Not on file  ?Social Connections: Not on file  ? ? ? ? ? ?Family History: ?Family History  ?Problem Relation Age of Onset  ? Diabetes Mother   ? Hypertension Mother   ? Hypertension Maternal Grandmother   ? Diabetes Maternal Aunt   ? Diabetes Maternal Uncle   ? ?     ? ?Review of Systems  ?Constitutional: Negative.   ?HENT: Negative.    ?Eyes: Negative.   ?Respiratory: Negative.    ?Cardiovascular: Negative.   ?Gastrointestinal: Negative.    ?Musculoskeletal:   ?     Wheel chair use ?Right below the knee amputation  ?Skin: Negative.  Negative for rash.  ?All other systems reviewed and are negative. ?Objective: ?BP 125/86   Pulse (!) 107   Temp 98.8 ?F (37.1 ?C)   Ht 5' 7.01" (1.702 m)   Wt 236 lb 15.9 oz (107.5 kg) Comment: previous  LMP 11/14/2021 (Exact Date) Comment: 1st day  SpO2 97%   BMI 37.11 kg/m?  ?Body mass index is 37.11 kg/m?Marland Kitchen ?Physical Exam ?Vitals and nursing note reviewed.  ?Constitutional:   ?   Appearance: She is obese.  ?HENT:  ?   Head: Normocephalic.  ?   Right Ear: External ear normal.  ?   Left Ear: External ear normal.  ?   Mouth/Throat:  ?   Mouth: Mucous membranes are moist.  ?   Pharynx: Oropharynx is clear.  ?Eyes:  ?  Conjunctiva/sclera: Conjunctivae normal.  ?Cardiovascular:  ?   Rate and Rhythm: Normal rate and regular rhythm.  ?   Pulses: Normal pulses.  ?   Heart sounds: Normal heart sounds.  ?Pulmonary:  ?   Effort: Pulmonary effort is normal.  ?   Breath sounds: Normal breath sounds.  ?Abdominal:  ?   General: Bowel sounds are normal.  ?Musculoskeletal:  ?   Right Lower Extremity: Right leg is amputated below knee.  ?Neurological:  ?   General: No focal deficit present.  ?   Mental Status: She is alert and oriented to person, place, and time.  ?Psychiatric:     ?   Mood and Affect: Mood normal.     ?   Behavior: Behavior normal.  ? ?The plan was reviewed with the patient/family, and all questions/concerned were addressed. ? ?It was my pleasure to see Leannah today and participate in her care. Please feel free to contact me with any questions or concerns. ? ?Sincerely, ? ?Lynnell Chad NP ?[facility] ?

## 2021-11-30 NOTE — Patient Instructions (Signed)
Diabetes Insipidus ?Diabetes insipidus (DI) is a rare condition that causes the body to produce more urine than normal. This leads to thirst and low fluid in the body (dehydration). In this condition, the urine is made mostly of water, or dilute urine. DI affects mostly adults, but it can happen at any age. ?There are four types of DI: ?Central DI. This is the most common type. ?Dipsogenic DI. ?Nephrogenic DI. ?Gestational DI. ?The most common forms of this condition are caused by a decrease in the production of the hormone that regulates urine output (antidiuretic hormone), or the body's resistance to this hormone. ?This condition is not related to type 1 or type 2 diabetes mellitus. ?What are the causes? ?Central DI is caused by damage to the pituitary gland or hypothalamus in the brain. Dipsogenic DI is caused by a defect in the thirst mechanism in the brain. This defect causes you to drink too much fluid. These may result from: ?Brain surgery. ?Infection. ?Inflammation. ?Brain tumor. ?Head injury. ?Nephrogenic DI is caused by the kidneys not responding to the antidiuretic hormone in the body. This may result from: ?Chronic kidney disease (CKD). ?Certain medicines, such as lithium. ?Low potassium levels. ?High calcium levels. ?Gestational DI is rare and is caused by the antidiuretic hormone that has stopped working properly. ?What are the signs or symptoms? ?Symptoms of this condition include: ?Excessive urination. This means urinating more than 10 cups (2.4 L) during a period of 24 hours. ?Excessive thirst. ?Too much nighttime urination (nocturia). ?Nausea. ?Diarrhea. ?How is this diagnosed? ?This condition may be diagnosed based on: ?Your medical history. ?A physical exam. ?Blood tests. ?Urine tests. ?A water deprivation test. During this test, you will stop drinking fluids for a period of time and your blood and urine will be checked regularly. ?An MRI. ?How is this treated? ?Once your specific type of  diabetes insipidus is diagnosed, treatment may include one or more of the following: ?Increasing or limiting your fluid intake. ?Taking medicines that contain artificial (synthetic) versions of the antidiuretic hormone. ?Stopping certain medicines that you take. ?Correcting the balance of minerals (electrolytes) in your body. ?Changing your diet. You may be put on a low-protein and low-sodium diet. ?You may need to visit your health care provider regularly to make sure your condition is being treated properly. You may also need to work with providers who specialize in: ?Kidney problems (nephrologist). ?Hormone disorders (endocrinologist). ?Follow these instructions at home: ?Eating and drinking ?Follow instructions from your health care provider about how much fluid and water to drink. You may be directed to drink more fluids and water, or to limit how much fluid and water you drink. ?Follow instructions from your health care provider about eating or drinking restrictions. ?General instructions ?Take over-the-counter and prescription medicines only as told by your health care provider. ?If directed, monitor your risk of dehydration in extreme heat. ?Carry a medical alert card or wear medical alert jewelry. ?Keep all follow-up visits as told by your health care provider. This is important. ?You may need to visit your health care provider regularly to make sure your condition is being treated properly. ?Contact a health care provider if: ?You continue to have symptoms after treatment. ?Get help right away if: ?You have extreme thirst. ?You have symptoms of severe dehydration, such as rapid heart rate, muscle cramps, or confusion. ?Summary ?Diabetes insipidus (DI) is a rare condition that causes the body to produce more urine than normal, which leads to thirst and dehydration. ?Follow instructions from  your health care provider about eating or drinking restrictions. ?Treatment may include increasing or limiting your  fluid intake and correcting the balance of minerals (electrolytes) in your body. ?Get help right away if you have symptoms of severe dehydration, such as rapid heart rate, muscle cramps, or confusion. ?This information is not intended to replace advice given to you by your health care provider. Make sure you discuss any questions you have with your health care provider. ?Document Revised: 10/22/2019 Document Reviewed: 10/22/2019 ?Elsevier Patient Education ? [place]. ? ?

## 2021-11-30 NOTE — Assessment & Plan Note (Signed)
Patient is new to practice establishing care.  Education provided on health tests and preventative care.  Patient will return for complete physical and labs. ?

## 2021-12-02 ENCOUNTER — Other Ambulatory Visit: Payer: Self-pay | Admitting: Nurse Practitioner

## 2021-12-02 MED ORDER — OZEMPIC (0.25 OR 0.5 MG/DOSE) 2 MG/1.5ML ~~LOC~~ SOPN: 0.5000 mg | PEN_INJECTOR | SUBCUTANEOUS | 3 refills | Status: DC

## 2021-12-02 NOTE — Progress Notes (Signed)
Patient open to starting ozempic ?Explained side effects and how to take ?Encouraged patient to call if experiencing any side effects ?Lantus 22 units qhs ?Novolog 7 u 3x daily with meals ?Starting ozempic 0.25mg  weekly (may be able to decrease OR d/c novolog all together) ?

## 2021-12-05 ENCOUNTER — Telehealth: Payer: Self-pay | Admitting: *Deleted

## 2021-12-05 ENCOUNTER — Other Ambulatory Visit: Payer: Self-pay | Admitting: *Deleted

## 2021-12-05 DIAGNOSIS — E1165 Type 2 diabetes mellitus with hyperglycemia: Secondary | ICD-10-CM

## 2021-12-05 MED ORDER — OZEMPIC (0.25 OR 0.5 MG/DOSE) 2 MG/1.5ML ~~LOC~~ SOPN: 0.5000 mg | PEN_INJECTOR | SUBCUTANEOUS | 3 refills | Status: DC

## 2021-12-05 NOTE — Telephone Encounter (Signed)
[name] (Key: [id number]) ?Rx #: [id number] ?Ozempic (0.25 or 0.5 MG/DOSE) 2MG /1.5ML pen-injectors ? ? ?Sent to plan  ?Wait for Determination ?

## 2021-12-05 NOTE — Telephone Encounter (Signed)
Message from Plan ?PA Case: [id number], Status: Approved, Coverage Starts on: 12/05/2021 12:00:00 AM, Coverage Ends on: 12/05/2022 12:00:00 AM. ?Pharm aware  ?

## 2021-12-07 ENCOUNTER — Telehealth: Payer: Self-pay | Admitting: Nurse Practitioner

## 2021-12-08 NOTE — Telephone Encounter (Signed)
Patient aware of what does she should be on. ?

## 2021-12-08 NOTE — Telephone Encounter (Signed)
Patient returning call. Please call back

## 2021-12-15 DIAGNOSIS — T8743 Infection of amputation stump, right lower extremity: Secondary | ICD-10-CM | POA: Insufficient documentation

## 2021-12-26 ENCOUNTER — Other Ambulatory Visit: Payer: Self-pay | Admitting: Nurse Practitioner

## 2021-12-26 MED ORDER — NIFEDIPINE ER OSMOTIC RELEASE 30 MG PO TB24
30.0000 mg | ORAL_TABLET | Freq: Every day | ORAL | 2 refills | Status: DC
Start: 2021-12-26 — End: 2022-03-27

## 2021-12-26 NOTE — Telephone Encounter (Signed)
Pt aware refill sent to pharmacy 

## 2021-12-26 NOTE — Telephone Encounter (Signed)
?  Prescription Request ? ?12/26/2021 ? ? ?What is the name of the medication or equipment? NIFEDIPINE (BP med) ? ?Have you contacted your pharmacy to request a refill? YES  ? ?Which pharmacy would you like this sent to? [facility], [place] ? ?Pt recently had visit with Onyeje to establish care and says her BP med refills were not sent to the pharmacy.  ?

## 2021-12-26 NOTE — Telephone Encounter (Signed)
NIFEdipine (PROCARDIA-XL/NIFEDICAL-XL) 30 MG 24 hr tablet ? ?Pt had her new pt 11/30/2021. ?Pt has upcoming apt 03/06/2022 ?Pt is completely out.  ?

## 2022-01-09 ENCOUNTER — Other Ambulatory Visit: Payer: Medicaid Other

## 2022-01-09 ENCOUNTER — Other Ambulatory Visit: Payer: Self-pay | Admitting: Nurse Practitioner

## 2022-01-09 DIAGNOSIS — E1165 Type 2 diabetes mellitus with hyperglycemia: Secondary | ICD-10-CM

## 2022-01-09 LAB — BAYER DCA HB A1C WAIVED: HB A1C (BAYER DCA - WAIVED): 6.4 % — ABNORMAL HIGH (ref 4.8–5.6)

## 2022-01-10 ENCOUNTER — Ambulatory Visit: Payer: Medicaid Other

## 2022-01-10 LAB — LIPID PANEL
Chol/HDL Ratio: 5.5 ratio — ABNORMAL HIGH (ref 0.0–4.4)
Cholesterol, Total: 191 mg/dL (ref 100–199)
HDL: 35 mg/dL — ABNORMAL LOW (ref >39)
LDL Chol Calc (NIH): 111 mg/dL — ABNORMAL HIGH (ref 0–99)
Triglycerides: 260 mg/dL — ABNORMAL HIGH (ref 0–149)
VLDL Cholesterol Cal: 45 mg/dL — ABNORMAL HIGH (ref 5–40)

## 2022-01-10 LAB — CBC WITH DIFFERENTIAL/PLATELET
Basophils Absolute: 0.1 10*3/uL (ref 0.0–0.2)
Basos: 1 %
EOS (ABSOLUTE): 0.1 10*3/uL (ref 0.0–0.4)
Eos: 1 %
Hematocrit: 36 % (ref 34.0–46.6)
Hemoglobin: 11.9 g/dL (ref 11.1–15.9)
Immature Grans (Abs): 0 10*3/uL (ref 0.0–0.1)
Immature Granulocytes: 0 %
Lymphocytes Absolute: 2.9 10*3/uL (ref 0.7–3.1)
Lymphs: 34 %
MCH: 28.6 pg (ref 26.6–33.0)
MCHC: 33.1 g/dL (ref 31.5–35.7)
MCV: 87 fL (ref 79–97)
Monocytes Absolute: 0.5 10*3/uL (ref 0.1–0.9)
Monocytes: 6 %
Neutrophils Absolute: 5 10*3/uL (ref 1.4–7.0)
Neutrophils: 58 %
Platelets: 380 10*3/uL (ref 150–450)
RBC: 4.16 x10E6/uL (ref 3.77–5.28)
RDW: 14.5 % (ref 11.7–15.4)
WBC: 8.6 10*3/uL (ref 3.4–10.8)

## 2022-01-10 LAB — CMP14+EGFR
ALT: 11 IU/L (ref 0–32)
AST: 14 IU/L (ref 0–40)
Albumin/Globulin Ratio: 0.9 — ABNORMAL LOW (ref 1.2–2.2)
Albumin: 3.6 g/dL — ABNORMAL LOW (ref 3.8–4.8)
Alkaline Phosphatase: 64 IU/L (ref 44–121)
BUN/Creatinine Ratio: 19 (ref 9–23)
BUN: 14 mg/dL (ref 6–20)
Bilirubin Total: 0.3 mg/dL (ref 0.0–1.2)
CO2: 26 mmol/L (ref 20–29)
Calcium: 9.8 mg/dL (ref 8.7–10.2)
Chloride: 101 mmol/L (ref 96–106)
Creatinine, Ser: 0.72 mg/dL (ref 0.57–1.00)
Globulin, Total: 3.9 g/dL (ref 1.5–4.5)
Glucose: 80 mg/dL (ref 70–99)
Potassium: 4.4 mmol/L (ref 3.5–5.2)
Sodium: 138 mmol/L (ref 134–144)
Total Protein: 7.5 g/dL (ref 6.0–8.5)
eGFR: 112 mL/min/{1.73_m2} (ref >59)

## 2022-01-13 ENCOUNTER — Ambulatory Visit: Payer: Medicaid Other | Admitting: Nurse Practitioner

## 2022-01-13 ENCOUNTER — Encounter: Payer: Self-pay | Admitting: Nurse Practitioner

## 2022-01-13 VITALS — BP 101/68 | HR 125 | Temp 98.7°F | Ht 67.0 in | Wt 236.0 lb

## 2022-01-13 DIAGNOSIS — M25561 Pain in right knee: Secondary | ICD-10-CM | POA: Diagnosis not present

## 2022-01-13 DIAGNOSIS — R110 Nausea: Secondary | ICD-10-CM | POA: Diagnosis not present

## 2022-01-13 DIAGNOSIS — G8929 Other chronic pain: Secondary | ICD-10-CM | POA: Insufficient documentation

## 2022-01-13 DIAGNOSIS — E1165 Type 2 diabetes mellitus with hyperglycemia: Secondary | ICD-10-CM

## 2022-01-13 MED ORDER — ONDANSETRON HCL 4 MG PO TABS: 4.0000 mg | ORAL_TABLET | Freq: Three times a day (TID) | ORAL | 1 refills | Status: DC | PRN

## 2022-01-13 MED ORDER — SEMAGLUTIDE (1 MG/DOSE) 4 MG/3ML ~~LOC~~ SOPN: 1.0000 mg | PEN_INJECTOR | SUBCUTANEOUS | 0 refills | Status: DC

## 2022-01-13 NOTE — Patient Instructions (Signed)
BMI for Adults ?What is BMI? ?Body mass index (BMI) is a number that is calculated from a person's weight and height. BMI can help estimate how much of a person's weight is composed of fat. BMI does not measure body fat directly. Rather, it is an alternative to procedures that directly measure body fat, which can be difficult and expensive. ?BMI can help identify people who may be at higher risk for certain medical problems. ?What are BMI measurements used for? ?BMI is used as a screening tool to identify possible weight problems. It helps determine whether a person is obese, overweight, a healthy weight, or underweight. ?BMI is useful for: ?Identifying a weight problem that may be related to a medical condition or may increase the risk for medical problems. ?Promoting changes, such as changes in diet and exercise, to help reach a healthy weight. BMI screening can be repeated to see if these changes are working. ?How is BMI calculated? ?BMI involves measuring your weight in relation to your height. Both height and weight are measured, and the BMI is calculated from those numbers. This can be done either in [place] ([place] or metric measurements. Note that charts and online BMI calculators are available to help you find your BMI quickly and easily without having to do these calculations yourself. ?To calculate your BMI in [place] ([place] measurements: ? ?Measure your weight in pounds (lb). ?Multiply the number of pounds by 703. ?For example, for a person who weighs 180 lb, multiply that number by 703, which equals 126,540. ?Measure your height in inches. Then multiply that number by itself to get a measurement called "inches squared." ?For example, for a person who is 70 inches tall, the "inches squared" measurement is 70 inches x 70 inches, which equals 4,900 inches squared. ?Divide the total from step 2 (number of lb x 703) by the total from step 3 (inches squared): 126,540 ? 4,900 = 25.8. This is your BMI. ?To  calculate your BMI in metric measurements: ?Measure your weight in kilograms (kg). ?Measure your height in meters (m). Then multiply that number by itself to get a measurement called "meters squared." ?For example, for a person who is 1.75 m tall, the "meters squared" measurement is 1.75 m x 1.75 m, which is equal to 3.1 meters squared. ?Divide the number of kilograms (your weight) by the meters squared number. In this example: 70 ? 3.1 = 22.6. This is your BMI. ?What do the results mean? ?BMI charts are used to identify whether you are underweight, normal weight, overweight, or obese. The following guidelines will be used: ?Underweight: BMI less than 18.5. ?Normal weight: BMI between 18.5 and 24.9. ?Overweight: BMI between 25 and 29.9. ?Obese: BMI of 30 or above. ?Keep these notes in mind: ?Weight includes both fat and muscle, so someone with a muscular build, such as an athlete, may have a BMI that is higher than 24.9. In cases like these, BMI is not an accurate measure of body fat. ?To determine if excess body fat is the cause of a BMI of 25 or higher, further assessments may need to be done by a health care provider. ?BMI is usually interpreted in the same way for men and women. ?Where to find more information ?For more information about BMI, including tools to quickly calculate your BMI, go to these websites: ?Centers for Disease Control and Prevention: http://www.wolf.info/ ?American Heart Association: www.heart.org ?National Heart, Lung, and Blood Institute: https://wilson-eaton.com/ ?Summary ?Body mass index (BMI) is a number that is calculated  from a person's weight and height. ?BMI may help estimate how much of a person's weight is composed of fat. BMI can help identify those who may be at higher risk for certain medical problems. ?BMI can be measured using English measurements or metric measurements. ?BMI charts are used to identify whether you are underweight, normal weight, overweight, or obese. ?This information is not  intended to replace advice given to you by your health care provider. Make sure you discuss any questions you have with your health care provider. ?Document Revised: 06/11/2019 Document Reviewed: 04/18/2019 ?Elsevier Patient Education ? [place]. ?Diabetes Mellitus Basics ? ?Diabetes mellitus, or diabetes, is a long-term (chronic) disease. It occurs when the body does not properly use sugar (glucose) that is released from food after you eat. ?Diabetes mellitus may be caused by one or both of these problems: ?Your pancreas does not make enough of a hormone called insulin. ?Your body does not react in a normal way to the insulin that it makes. ?Insulin lets glucose enter cells in your body. This gives you energy. If you have diabetes, glucose cannot get into cells. This causes high blood glucose (hyperglycemia). ?How to treat and manage diabetes ?You may need to take insulin or other diabetes medicines daily to keep your glucose in balance. If you are prescribed insulin, you will learn how to give yourself insulin by injection. You may need to adjust the amount of insulin you take based on the foods that you eat. ?You will need to check your blood glucose levels using a glucose monitor as told by your health care provider. The readings can help determine if you have low or high blood glucose. ?Generally, you should have these blood glucose levels: ?Before meals (preprandial): 80-130 mg/dL (4.4-7.2 mmol/L). ?After meals (postprandial): below 180 mg/dL (10 mmol/L). ?Hemoglobin A1c (HbA1c) level: less than 7%. ?Your health care provider will set treatment goals for you. ?Keep all follow-up visits. This is important. ?Follow these instructions at home: ?Diabetes medicines ?Take your diabetes medicines every day as told by your health care provider. List your diabetes medicines here: ?Name of medicine: ______________________________ ?Amount (dose): _______________ Time (a.m./p.m.): _______________ Notes:  ___________________________________ ?Name of medicine: ______________________________ ?Amount (dose): _______________ Time (a.m./p.m.): _______________ Notes: ___________________________________ ?Name of medicine: ______________________________ ?Amount (dose): _______________ Time (a.m./p.m.): _______________ Notes: ___________________________________ ?Insulin ?If you use insulin, list the types of insulin you use here: ?Insulin type: ______________________________ ?Amount (dose): _______________ Time (a.m./p.m.): _______________Notes: ___________________________________ ?Insulin type: ______________________________ ?Amount (dose): _______________ Time (a.m./p.m.): _______________ Notes: ___________________________________ ?Insulin type: ______________________________ ?Amount (dose): _______________ Time (a.m./p.m.): _______________ Notes: ___________________________________ ?Insulin type: ______________________________ ?Amount (dose): _______________ Time (a.m./p.m.): _______________ Notes: ___________________________________ ?Insulin type: ______________________________ ?Amount (dose): _______________ Time (a.m./p.m.): _______________ Notes: ___________________________________ ?Managing blood glucose ? ?Check your blood glucose levels using a glucose monitor as told by your health care provider. ?Write down the times that you check your glucose levels here: ?Time: _______________ Notes: ___________________________________ ?Time: _______________ Notes: ___________________________________ ?Time: _______________ Notes: ___________________________________ ?Time: _______________ Notes: ___________________________________ ?Time: _______________ Notes: ___________________________________ ?Time: _______________ Notes: ___________________________________ ? ?Low blood glucose ?Low blood glucose (hypoglycemia) is when glucose is at or below 70 mg/dL (3.9 mmol/L). Symptoms may include: ?Feeling: ?Hungry. ?Sweaty and  clammy. ?Irritable or easily upset. ?Dizzy. ?Sleepy. ?Having: ?A fast heartbeat. ?A headache. ?A change in your vision. ?Numbness around the mouth, lips, or tongue. ?Having trouble with: ?Moving (coordination). ?Sleeping. ?

## 2022-01-13 NOTE — Assessment & Plan Note (Signed)
Nausea caused by medication [Ozempic].  Zofran 4 mg tablet by mouth as needed,  Rx sent to pharmacy ?

## 2022-01-13 NOTE — Assessment & Plan Note (Signed)
Increased Ozempic from 0.5 to 1 mg weekly.  Blood sugar 1 25-130, current A1c 6.4, patient is tolerating medication well except for mild nausea.  Zofran ordered 4 mg tablet as needed.  Follow-up in 4 weeks ?

## 2022-01-13 NOTE — Assessment & Plan Note (Signed)
Patient continues to experience pain right upper knee.  Completed referral to pain management. ?

## 2022-01-13 NOTE — Progress Notes (Addendum)
? ?Established Patient Office Visit ? ?Subjective:  ?Patient ID: [name], female    DOB: 1985-12-25  Age: 36 y.o. MRN: [id number] ? ?CC:  ?Chief Complaint  ?Patient presents with  ? Diabetes  ? Pain Management  ? ? ?HPI ?[name] presents for Pain ? ?She reports right knee pain. was not an injury that may have caused the pain.  Patient is a recent below the knee amputee.  The pain started a few months ago and is staying constant. The pain does not radiate . The pain is described as aching, is 6/10 in intensity, occurring constantly. Symptoms are worse in the: morning, mid-day, afternoon, evening  ?Aggravating factors: none Relieving factors: medication prescription pain medication oxycodone 5 mg tablet by mouth 4 times daily as needed. Marland Kitchen  ?She reports therapeutic effect of medication ? ?Nausea precipitated by medication i.e. Ozempic for diabetes and weight loss management.  Patient increased dose of 1.5 to 1 mg weekly no other symptoms associated with current concern ? ?Past Medical History:  ?Diagnosis Date  ? Chronic hypertension   ? 12-16-2020  documented in epic since 2017 pt dx hypertension (not during pregnancy);  pt currently is not and has not been taking any bp med,  bp at [facility] 12-15-2020 165/ 104  ? Missed ab 12/15/2020  ? Type 2 diabetes mellitus ([place])   ? 12-16-2020  pt stated only during pregnancy took medication, at [facility] 12-15-2020 blood glucose 353 pt was given metformin bid and pt stated did start taking it  ? Wears glasses   ? ? ?Past Surgical History:  ?Procedure Laterality Date  ? BELOW KNEE LEG AMPUTATION Right   ? CESAREAN SECTION N/A 12/25/2018  ? Procedure: CESAREAN SECTION;  Surgeon: [name], DO;  Location: [facility];  Service: Obstetrics;  Laterality: N/A;  ? TONSILLECTOMY AND ADENOIDECTOMY  age 40  ? ? ?Family History  ?Problem Relation Age of Onset  ? Diabetes Mother   ? Hypertension Mother   ? Hypertension Maternal Grandmother   ? Diabetes Maternal Aunt   ? Diabetes  Maternal Uncle   ? ? ?Social History  ? ?Socioeconomic History  ? Marital status: Single  ?  Spouse name: Not on file  ? Number of children: Not on file  ? Years of education: Not on file  ? Highest education level: Not on file  ?Occupational History  ? Not on file  ?Tobacco Use  ? Smoking status: Never  ? Smokeless tobacco: Never  ?Vaping Use  ? Vaping Use: Never used  ?Substance and Sexual Activity  ? Alcohol use: No  ? Drug use: Never  ? Sexual activity: Yes  ?  Birth control/protection: None  ?Other Topics Concern  ? Not on file  ?Social History Narrative  ? Not on file  ? ?Social Determinants of Health  ? ?Financial Resource Strain: Not on file  ?Food Insecurity: Not on file  ?Transportation Needs: Not on file  ?Physical Activity: Not on file  ?Stress: Not on file  ?Social Connections: Not on file  ?Intimate Partner Violence: Not on file  ? ? ?Outpatient Medications Prior to Visit  ?Medication Sig Dispense Refill  ? Blood Glucose Monitoring Suppl (GLUCOCOM BLOOD GLUCOSE MONITOR) DEVI See admin instructions.    ? gabapentin (NEURONTIN) 300 MG capsule Take by mouth.    ? glucose blood (PRECISION QID TEST) test strip Use 4 (four) times daily    ? insulin glargine (LANTUS SOLOSTAR) 100 UNIT/ML Solostar Pen Inject into the  skin.    ? magnesium oxide (MAG-OX) 400 MG tablet Take by mouth.    ? NIFEdipine (PROCARDIA-XL/NIFEDICAL-XL) 30 MG 24 hr tablet Take 1 tablet (30 mg total) by mouth daily. Can increase to twice a day as needed for symptomatic contractions 30 tablet 2  ? NOVOLOG FLEXPEN 100 UNIT/ML FlexPen SMARTSIG:7 Unit(s) SUB-Q 3 Times Daily    ? oxyCODONE (OXY IR/ROXICODONE) 5 MG immediate release tablet Take 5 mg by mouth 4 (four) times daily as needed.    ? Semaglutide,0.25 or 0.5MG/DOS, (OZEMPIC, 0.25 OR 0.5 MG/DOSE,) 2 MG/1.5ML SOPN Inject 0.5 mg into the skin once a week. DX:E11.65 3 mL 3  ? metFORMIN (GLUCOPHAGE-XR) 500 MG 24 hr tablet Take by mouth. (Patient not taking: Reported on 01/13/2022)    ? ?No  facility-administered medications prior to visit.  ? ? ?Allergies  ?Allergen Reactions  ? Almond Oil Hives  ? Almond (Diagnostic) Hives  ? Bee Venom Hives  ? Limonene Hives  ? ? ?ROS ?Review of Systems  ?Constitutional: Negative.   ?HENT: Negative.    ?Eyes: Negative.   ?Respiratory: Negative.    ?Cardiovascular: Negative.   ?Gastrointestinal: Negative.   ?Genitourinary: Negative.   ?Musculoskeletal: Negative.   ?Skin: Negative.  Negative for rash.  ?Psychiatric/Behavioral:  The patient is not nervous/anxious.   ?All other systems reviewed and are negative. ? ?  ?Objective:  ?  ?Physical Exam ?Vitals and nursing note reviewed.  ?Constitutional:   ?   Appearance: Normal appearance. She is obese.  ?HENT:  ?   Head: Normocephalic.  ?   Right Ear: External ear normal.  ?   Left Ear: External ear normal.  ?   Nose: Nose normal.  ?Eyes:  ?   Conjunctiva/sclera: Conjunctivae normal.  ?Cardiovascular:  ?   Rate and Rhythm: Normal rate and regular rhythm.  ?   Pulses: Normal pulses.  ?   Heart sounds: Normal heart sounds.  ?Pulmonary:  ?   Effort: Pulmonary effort is normal.  ?   Breath sounds: Normal breath sounds.  ?Abdominal:  ?   General: Bowel sounds are normal.  ?Musculoskeletal:     ?   General: Normal range of motion.  ?Skin: ?   General: Skin is warm.  ?   Findings: No rash.  ?Neurological:  ?   General: No focal deficit present.  ?   Mental Status: She is alert and oriented to person, place, and time.  ?Psychiatric:     ?   Mood and Affect: Mood normal.     ?   Behavior: Behavior normal.  ? ? ?BP 101/68   Pulse (!) 125   Temp 98.7 ?F (37.1 ?C)   Ht '5\' 7"'  (1.702 m) Comment: patient unable to stand for measurement  Wt 236 lb (107 kg) Comment: previous, patient unable to stand for measurement  LMP 12/10/2021   SpO2 98%   BMI 36.96 kg/m?  ?Wt Readings from Last 3 Encounters:  ?01/13/22 236 lb (107 kg)  ?11/30/21 236 lb 15.9 oz (107.5 kg)  ?12/31/20 213 lb (96.6 kg)  ? ? ? ?Health Maintenance Due  ?Topic Date Due   ? FOOT EXAM  Never done  ? OPHTHALMOLOGY EXAM  Never done  ? URINE MICROALBUMIN  Never done  ? PAP SMEAR-Modifier  06/20/2021  ? ? ?There are no preventive care reminders to display for this patient. ? ?Lab Results  ?Component Value Date  ? TSH 1.450 06/20/2018  ? ?Lab Results  ?Component Value Date  ?  WBC 8.6 01/09/2022  ? HGB 11.9 01/09/2022  ? HCT 36.0 01/09/2022  ? MCV 87 01/09/2022  ? PLT 380 01/09/2022  ? ?Lab Results  ?Component Value Date  ? NA 138 01/09/2022  ? K 4.4 01/09/2022  ? CO2 26 01/09/2022  ? GLUCOSE 80 01/09/2022  ? BUN 14 01/09/2022  ? CREATININE 0.72 01/09/2022  ? BILITOT 0.3 01/09/2022  ? ALKPHOS 64 01/09/2022  ? AST 14 01/09/2022  ? ALT 11 01/09/2022  ? PROT 7.5 01/09/2022  ? ALBUMIN 3.6 (L) 01/09/2022  ? CALCIUM 9.8 01/09/2022  ? ANIONGAP 7 12/15/2020  ? EGFR 112 01/09/2022  ? ?Lab Results  ?Component Value Date  ? CHOL 191 01/09/2022  ? ?Lab Results  ?Component Value Date  ? HDL 35 (L) 01/09/2022  ? ?Lab Results  ?Component Value Date  ? LDLCALC 111 (H) 01/09/2022  ? ?Lab Results  ?Component Value Date  ? TRIG 260 (H) 01/09/2022  ? ?Lab Results  ?Component Value Date  ? CHOLHDL 5.5 (H) 01/09/2022  ? ?Lab Results  ?Component Value Date  ? HGBA1C 6.4 (H) 01/09/2022  ? ? ?  ?Assessment & Plan:  ? ?Problem List Items Addressed This Visit   ? ?  ? Endocrine  ? Type 2 diabetes mellitus with hyperglycemia (HCC)  ?  Increased Ozempic from 0.5 to 1 mg weekly.  Blood sugar 1 25-130, current A1c 6.4, patient is tolerating medication well except for mild nausea.  Zofran ordered 4 mg tablet as needed.  Follow-up in 4 weeks ? ?  ?  ? Relevant Medications  ? metFORMIN (GLUCOPHAGE-XR) 500 MG 24 hr tablet  ? Semaglutide, 1 MG/DOSE, 4 MG/3ML SOPN  ?  ? Other  ? Chronic pain of right knee  ?  Patient continues to experience pain right upper knee.  Completed referral to pain management. ? ?  ?  ? Relevant Orders  ? Amb Ref to Medical Weight Management  ? Nausea - Primary  ?  Nausea caused by medication  [Ozempic].  Zofran 4 mg tablet by mouth as needed,  Rx sent to pharmacy ? ?  ?  ? Relevant Medications  ? ondansetron (ZOFRAN) 4 MG tablet  ? ? ?Meds ordered this encounter  ?Medications  ? ondansetron (ZOFRAN) 4 MG tabl

## 2022-01-21 DIAGNOSIS — I10 Essential (primary) hypertension: Secondary | ICD-10-CM | POA: Insufficient documentation

## 2022-01-21 DIAGNOSIS — E119 Type 2 diabetes mellitus without complications: Secondary | ICD-10-CM | POA: Insufficient documentation

## 2022-01-22 ENCOUNTER — Other Ambulatory Visit: Payer: Self-pay | Admitting: Nurse Practitioner

## 2022-01-27 ENCOUNTER — Encounter: Payer: Medicaid Other | Attending: Physician Assistant | Admitting: Physician Assistant

## 2022-01-27 DIAGNOSIS — E1143 Type 2 diabetes mellitus with diabetic autonomic (poly)neuropathy: Secondary | ICD-10-CM | POA: Diagnosis not present

## 2022-01-27 DIAGNOSIS — L97812 Non-pressure chronic ulcer of other part of right lower leg with fat layer exposed: Secondary | ICD-10-CM | POA: Insufficient documentation

## 2022-01-27 DIAGNOSIS — T8781 Dehiscence of amputation stump: Secondary | ICD-10-CM | POA: Diagnosis not present

## 2022-01-27 DIAGNOSIS — I10 Essential (primary) hypertension: Secondary | ICD-10-CM | POA: Diagnosis not present

## 2022-01-27 DIAGNOSIS — Z89511 Acquired absence of right leg below knee: Secondary | ICD-10-CM | POA: Insufficient documentation

## 2022-01-27 DIAGNOSIS — E1140 Type 2 diabetes mellitus with diabetic neuropathy, unspecified: Secondary | ICD-10-CM | POA: Insufficient documentation

## 2022-01-27 DIAGNOSIS — E11622 Type 2 diabetes mellitus with other skin ulcer: Secondary | ICD-10-CM | POA: Insufficient documentation

## 2022-01-27 DIAGNOSIS — Y835 Amputation of limb(s) as the cause of abnormal reaction of the patient, or of later complication, without mention of misadventure at the time of the procedure: Secondary | ICD-10-CM | POA: Diagnosis not present

## 2022-01-27 NOTE — Progress Notes (Signed)
RIATA, IKEDA ([id number]) ?Visit Report for 01/27/2022 ?Chief Complaint Document Details ?Patient Name: [name], [name] ?Date of Service: 01/27/2022 8:45 AM ?Medical Record Number: [id number] ?Patient Account Number: [id number] ?Date of Birth/Sex: 1985/11/19 (36 y.o. F) ?Treating RN: Yevonne Pax ?Primary Care Provider: Lynnell Chad Other Clinician: ?Referring Provider: Valentina Shaggy ?Treating Provider/Extender: Allen Derry ?Weeks in Treatment: 0 ?Information Obtained from: Patient ?Chief Complaint ?Right BKA Amputation Site Disruption ?Electronic Signature(s) ?Signed: 01/27/2022 9:24:27 AM By: Lenda Kelp PA-C ?Entered By: Lenda Kelp on 01/27/2022 09:24:27 ?ANNIYAH, MOOD ([id number]) ?-------------------------------------------------------------------------------- ?HPI Details ?Patient Name: [name] ?Date of Service: 01/27/2022 8:45 AM ?Medical Record Number: [id number] ?Patient Account Number: [id number] ?Date of Birth/Sex: [birth year] (36 y.o. F) ?Treating RN: Yevonne Pax ?Primary Care Provider: Lynnell Chad Other Clinician: ?Referring Provider: Valentina Shaggy ?Treating Provider/Extender: Allen Derry ?Weeks in Treatment: 0 ?History of Present Illness ?HPI Description: 01-27-2022 upon evaluation today patient appears to be doing somewhat poorly in regard to her right below-knee amputation. ?This unfortunately developed a abscess internally following the initial amputation which was January 2023. Subsequently she was getting very ?close to starting to work on the prosthesis when she had an area that somewhat bulged out ended up being an abscess and the incision and ?drainage was performed April 22. Subsequently she did have antibiotic beads placed in a wound VAC after this was cleared out. Her most recent ?reported hemoglobin A1c was 6.4. With that being said the patient tells me that her blood sugars seem to be under pretty good control as best ?she can tell at this point. She also tells me that the wound  VAC seems to be functioning appropriately although upon evaluation the did not appear ?to be anything tucked into the region of undermining and depth in the roughly 9-12 o'clock location based on what we saw today. This is also ?where the antibiotic beads are and I can see those in place. I do believe that she is getting need to have the wound VAC tucked down into this ?location. ?Patient does have a history of diabetes mellitus type 2, diabetic neuropathy, and hypertension. This initial amputation occurred as a result of her ?having sewing needles that actually went into her heel she did not know about it started having trouble with her heel and ended up with a ?significant infection which was stated to be necrotizing and she subsequently had to have emergency amputation. ?Electronic Signature(s) ?Signed: 01/27/2022 11:08:45 AM By: Lenda Kelp PA-C ?Entered By: Lenda Kelp on 01/27/2022 11:08:45 ?KORAYMA, HAGWOOD ([id number]) ?-------------------------------------------------------------------------------- ?Physical Exam Details ?Patient Name: [name] ?Date of Service: 01/27/2022 8:45 AM ?Medical Record Number: [id number] ?Patient Account Number: [id number] ?Date of Birth/Sex: [birth year] (36 y.o. F) ?Treating RN: Yevonne Pax ?Primary Care Provider: Lynnell Chad Other Clinician: ?Referring Provider: Valentina Shaggy ?Treating Provider/Extender: Allen Derry ?Weeks in Treatment: 0 ?Constitutional ?patient is hypertensive.. pulse regular and within target range for patient.Marland Kitchen respirations regular, non-labored and within target range for patient.. ?temperature within target range for patient.. Well-nourished and well-hydrated in no acute distress. ?Eyes ?conjunctiva clear no eyelid edema noted. pupils equal round and reactive to light and accommodation. ?Ears, Nose, Mouth, and Throat ?no gross abnormality of ear auricles or external auditory canals. normal hearing noted during conversation. mucus membranes  moist. ?Respiratory ?normal breathing without difficulty. ?Psychiatric ?this patient is able to make decisions and demonstrates good insight into disease process. Alert and Oriented x 3. pleasant and cooperative. ?Notes ?Upon inspection patient's wound bed actually showed signs of doing decently well there  is some need for probable debridement at some point ?coming forward but right now this seems to be doing okay. I do not think that I would not jump in any sharp debridement at this point the ?antibiotic beads are still in place and have not completely dissolved as of yet. Obviously we will keep those in place and allow them to continue to ?do what they are doing she is also on oral Keflex based on what she tells me today. Again patient does have an amputation of the right leg below ?the knee and this is where the surgical dehiscence is at this point. ?Electronic Signature(s) ?Signed: 01/27/2022 11:09:36 AM By: Lenda Kelp PA-C ?Entered By: Lenda Kelp on 01/27/2022 11:09:35 ?GURNOOR, SLOOP ([id number]) ?-------------------------------------------------------------------------------- ?Physician Orders Details ?Patient Name: [name] ?Date of Service: 01/27/2022 8:45 AM ?Medical Record Number: [id number] ?Patient Account Number: [id number] ?Date of Birth/Sex: 09-08-1986 (36 y.o. F) ?Treating RN: Yevonne Pax ?Primary Care Provider: Lynnell Chad Other Clinician: ?Referring Provider: Valentina Shaggy ?Treating Provider/Extender: Allen Derry ?Weeks in Treatment: 0 ?Verbal / Phone Orders: No ?Diagnosis Coding ?ICD-10 Coding ?Code Description ?E11.622 Type 2 diabetes mellitus with other skin ulcer ?T81.31XA Disruption of external operation (surgical) wound, not elsewhere classified, initial encounter ?E33.295 Non-pressure chronic ulcer of other part of right lower leg with fat layer exposed ?E11.43 Type 2 diabetes mellitus with diabetic autonomic (poly)neuropathy ?I10 Essential (primary) hypertension ?Follow-up  Appointments ?o Return Appointment in 1 week. ?o Nurse Visit as needed - beginning of week ?Bathing/ Shower/ Hygiene ?o No tub bath. ?Negative Pressure Wound Therapy ?o Wound VAC settings at continuous pressure. Use foam to wound cavity. Please order WHITE foam to fill any ?tunnel/s and/or undermining when necessary. Change VAC dressing 2 X WEEK. Change canister as indicated when full. ?o Other: - we t to dry if wound vac malfuntcions ?Wound Treatment ?Electronic Signature(s) ?Signed: 01/27/2022 1:52:28 PM By: Yevonne Pax RN ?Signed: 01/27/2022 4:06:41 PM By: Lenda Kelp PA-C ?Entered By: Yevonne Pax on 01/27/2022 09:33:02 ?AROUSH, CHASSE ([id number]) ?-------------------------------------------------------------------------------- ?Problem List Details ?Patient Name: DWANNA, GOSHERT ?Date of Service: 01/27/2022 8:45 AM ?Medical Record Number: [id number] ?Patient Account Number: [id number] ?Date of Birth/Sex: 1986/03/02 (36 y.o. F) ?Treating RN: Yevonne Pax ?Primary Care Provider: Lynnell Chad Other Clinician: ?Referring Provider: Valentina Shaggy ?Treating Provider/Extender: Allen Derry ?Weeks in Treatment: 0 ?Active Problems ?ICD-10 ?Encounter ?Code Description Active Date MDM ?Diagnosis ?E11.622 Type 2 diabetes mellitus with other skin ulcer 01/27/2022 No Yes ?T81.31XA Disruption of external operation (surgical) wound, not elsewhere 01/27/2022 No Yes ?classified, initial encounter ?A35.573 Non-pressure chronic ulcer of other part of right lower leg with fat layer 01/27/2022 No Yes ?exposed ?E11.43 Type 2 diabetes mellitus with diabetic autonomic (poly)neuropathy 01/27/2022 No Yes ?I10 Essential (primary) hypertension 01/27/2022 No Yes ?Inactive Problems ?Resolved Problems ?Electronic Signature(s) ?Signed: 01/27/2022 9:23:58 AM By: Lenda Kelp PA-C ?Entered By: Lenda Kelp on 01/27/2022 09:23:57 ?HADIYA, SPOERL  ([id number]) ?-------------------------------------------------------------------------------- ?Progress Note Details ?Patient Name: MINDEL, FRISCIA ?Date of Service: 01/27/2022 8:45 AM ?Medical Record Number: [id number] ?Patient Account Number: [id number] ?Date of Birth/Sex: 1986-09-25 (36 y.o. F)

## 2022-01-27 NOTE — Progress Notes (Signed)
DEMESHA, BOORMAN ([id number]) ?Visit Report for 01/27/2022 ?Abuse Risk Screen Details ?Patient Name: [name], [name] ?Date of Service: 01/27/2022 8:45 AM ?Medical Record Number: [id number] ?Patient Account Number: [id number] ?Date of Birth/Sex: 1986/08/03 (35 y.o. F) ?Treating RN: Yevonne Pax ?Primary Care Yenty Bloch: Lynnell Chad Other Clinician: ?Referring Aneesh Faller: Valentina Shaggy ?Treating Brockton Mckesson/Extender: Allen Derry ?Weeks in Treatment: 0 ?Abuse Risk Screen Items ?Answer ?ABUSE RISK SCREEN: ?Has anyone close to you tried to hurt or harm you recentlyo No ?Do you feel uncomfortable with anyone in your familyo No ?Has anyone forced you do things that you didnot want to doo No ?Electronic Signature(s) ?Signed: 01/27/2022 1:52:28 PM By: Yevonne Pax RN ?Entered ByYevonne Pax on 01/27/2022 08:58:41 ?[name], [name] ([id number]) ?-------------------------------------------------------------------------------- ?Activities of Daily Living Details ?Patient Name: [name], [name] ?Date of Service: 01/27/2022 8:45 AM ?Medical Record Number: [id number] ?Patient Account Number: [id number] ?Date of Birth/Sex: [birth year] (35 y.o. F) ?Treating RN: Yevonne Pax ?Primary Care Elon Eoff: Lynnell Chad Other Clinician: ?Referring Ryin Ambrosius: Valentina Shaggy ?Treating Gregary Blackard/Extender: Allen Derry ?Weeks in Treatment: 0 ?Activities of Daily Living Items ?Answer ?Activities of Daily Living (Please select one for each item) ?Drive Automobile Not Able ?Take Medications Completely Able ?Use Telephone Completely Able ?Care for Appearance Completely Able ?Use Toilet Completely Able ?Bath / Shower Completely Able ?Dress Self Completely Able ?Feed Self Completely Able ?Walk Completely Able ?Get In / Out Bed Completely Able ?Housework Completely Able ?Prepare Meals Completely Able ?Handle Money Completely Able ?Shop for Self Completely Able ?Electronic Signature(s) ?Signed: 01/27/2022 1:52:28 PM By: Yevonne Pax RN ?Entered By: Yevonne Pax on 01/27/2022  08:59:09 ?[name], [name] ([id number]) ?-------------------------------------------------------------------------------- ?Education Screening Details ?Patient Name: [name], [name] ?Date of Service: 01/27/2022 8:45 AM ?Medical Record Number: [id number] ?Patient Account Number: [id number] ?Date of Birth/Sex: [birth year] (35 y.o. F) ?Treating RN: Yevonne Pax ?Primary Care Ebb Carelock: Lynnell Chad Other Clinician: ?Referring Lauryn Lizardi: Valentina Shaggy ?Treating Emil Weigold/Extender: Allen Derry ?Weeks in Treatment: 0 ?Primary Learner Assessed: Patient ?Learning Preferences/Education Level/Primary Language ?Learning Preference: Explanation ?Highest Education Level: High School ?Preferred Language: English ?Cognitive Barrier ?Language Barrier: No ?Translator Needed: No ?Memory Deficit: No ?Emotional Barrier: No ?Cultural/Religious Beliefs Affecting Medical Care: No ?Physical Barrier ?Impaired Vision: Yes Glasses ?Impaired Hearing: No ?Decreased Hand dexterity: No ?Knowledge/Comprehension ?Knowledge Level: Medium ?Comprehension Level: High ?Ability to understand written instructions: High ?Ability to understand verbal instructions: High ?Motivation ?Anxiety Level: Anxious ?Cooperation: Cooperative ?Education Importance: Acknowledges Need ?Interest in Health Problems: Asks Questions ?Perception: Coherent ?Willingness to Engage in Self-Management ?High ?Activities: ?Readiness to Engage in Self-Management ?High ?Activities: ?Electronic Signature(s) ?Signed: 01/27/2022 1:52:28 PM By: Yevonne Pax RN ?Entered ByYevonne Pax on 01/27/2022 08:59:34 ?[name], [name] ([id number]) ?-------------------------------------------------------------------------------- ?Fall Risk Assessment Details ?Patient Name: [name], [name] ?Date of Service: 01/27/2022 8:45 AM ?Medical Record Number: [id number] ?Patient Account Number: [id number] ?Date of Birth/Sex: 12/05/1985 (35 y.o. F) ?Treating RN: Yevonne Pax ?Primary Care Addisyn Leclaire: Lynnell Chad Other  Clinician: ?Referring Darlin Stenseth: Valentina Shaggy ?Treating Nyliah Nierenberg/Extender: Allen Derry ?Weeks in Treatment: 0 ?Fall Risk Assessment Items ?Have you had 2 or more falls in the last 12 monthso 0 No ?Have you had any fall that resulted in injury in the last 12 monthso 0 No ?FALLS RISK SCREEN ?History of falling - immediate or within 3 months 0 No ?Secondary diagnosis (Do you have 2 or more medical diagnoseso) 0 No ?Ambulatory aid ?None/bed rest/wheelchair/nurse 0 No ?Crutches/cane/walker 0 No ?Furniture 0 No ?Intravenous therapy Access/Saline/Heparin Lock 0 No ?Gait/Transferring ?Normal/ bed rest/ wheelchair 0 No ?Weak (short steps with or without shuffle, stooped but able to lift  head while walking, may ?0 No ?seek support from furniture) ?Impaired (short steps with shuffle, may have difficulty arising from chair, head down, impaired ?0 No ?balance) ?Mental Status ?Oriented to own ability 0 No ?Electronic Signature(s) ?Signed: 01/27/2022 1:52:28 PM By: Yevonne Pax RN ?Entered By: Yevonne Pax on 01/27/2022 08:59:40 ?[name], [name] ([id number]) ?-------------------------------------------------------------------------------- ?Foot Assessment Details ?Patient Name: [name], [name] ?Date of Service: 01/27/2022 8:45 AM ?Medical Record Number: [id number] ?Patient Account Number: [id number] ?Date of Birth/Sex: 07-Sep-1986 (35 y.o. F) ?Treating RN: Yevonne Pax ?Primary Care Faizon Capozzi: Lynnell Chad Other Clinician: ?Referring Laveda Demedeiros: Valentina Shaggy ?Treating Lowen Barringer/Extender: Allen Derry ?Weeks in Treatment: 0 ?Foot Assessment Items ?[x]  Unable to perform right foot assessment due to amputation ?Site Locations ?+ = Sensation present, - = Sensation absent, C = Callus, U = Ulcer ?R = Redness, W = Warmth, M = Maceration, PU = Pre-ulcerative lesion ?F = Fissure, S = Swelling, D = Dryness ?Assessment ?Right: Left: ?Other Deformity: No ?Prior Foot Ulcer: No ?Prior Amputation: No ?Charcot Joint: No ?Ambulatory Status:  Non-ambulatory ?Assistance Device: Wheelchair ?Gait: Steady ?Electronic Signature(s) ?Signed: 01/27/2022 1:52:28 PM By: 01/29/2022 RN ?Entered By: Yevonne Pax on 01/27/2022 09:00:19 ?[name], [name] ([name]) ?-------------------------------------------------------------------------------- ?Nutrition Risk Screening Details ?Patient Name: [name], [name] ?Date of Service: 01/27/2022 8:45 AM ?Medical Record Number: 01/29/2022 ?Patient Account Number: [id number] ?Date of Birth/Sex: 21-Feb-1986 (35 y.o. F) ?Treating RN: 10-05-1973 ?Primary Care Avila Albritton: Yevonne Pax Other Clinician: ?Referring Thad Osoria: Lynnell Chad ?Treating Jalicia Roszak/Extender: Valentina Shaggy ?Weeks in Treatment: 0 ?Height (in): 67 ?Weight (lbs): 219 ?Body Mass Index (BMI): 34.3 ?Nutrition Risk Screening Items ?Score Screening ?NUTRITION RISK SCREEN: ?I have an illness or condition that made me change the kind and/or amount of food I eat 0 No ?I eat fewer than two meals per day 0 No ?I eat few fruits and vegetables, or milk products 0 No ?I have three or more drinks of beer, liquor or wine almost every day 0 No ?I have tooth or mouth problems that make it hard for me to eat 0 No ?I don't always have enough money to buy the food I need 0 No ?I eat alone most of the time 0 No ?I take three or more different prescribed or over-the-counter drugs a day 1 Yes ?Without wanting to, I have lost or gained 10 pounds in the last six months 0 No ?I am not always physically able to shop, cook and/or feed myself 0 No ?Nutrition Protocols ?Good Risk Protocol 0 No interventions needed ?Moderate Risk Protocol ?High Risk Proctocol ?Risk Level: Good Risk ?Score: 1 ?Electronic Signature(s) ?Signed: 01/27/2022 1:52:28 PM By: 01/29/2022 RN ?Entered ByYevonne Pax on 01/27/2022 08:59:53 ?

## 2022-01-27 NOTE — Progress Notes (Addendum)
[name], [name] ([id number]) ?Visit Report for 01/27/2022 ?Allergy List Details ?Patient Name: [name], [name] ?Date of Service: 01/27/2022 8:45 AM ?Medical Record Number: [id number] ?Patient Account Number: [id number] ?Date of Birth/Sex: 04/24/1986 (35 y.o. F) ?Treating RN: Yevonne Pax ?Primary Care Shaniece Bussa: Lynnell Chad Other Clinician: ?Referring Kaylana Fenstermacher: Lynnell Chad ?Treating Nikolaus Pienta/Extender: Allen Derry ?Weeks in Treatment: 0 ?Allergies ?Active Allergies ?almond ?bee venom protein (honey bee) ?lemonene ?Type: Allergen ?Allergy Notes ?Electronic Signature(s) ?Signed: 01/27/2022 1:52:28 PM By: Yevonne Pax RN ?Entered ByYevonne Pax on 01/27/2022 08:55:48 ?[name], [name] ([id number]) ?-------------------------------------------------------------------------------- ?Arrival Information Details ?Patient Name: [name], [name] ?Date of Service: 01/27/2022 8:45 AM ?Medical Record Number: [id number] ?Patient Account Number: [id number] ?Date of Birth/Sex: [birth year] (35 y.o. F) ?Treating RN: Yevonne Pax ?Primary Care Aven Christen: Lynnell Chad Other Clinician: ?Referring Tamana Hatfield: Lynnell Chad ?Treating Duard Spiewak/Extender: Allen Derry ?Weeks in Treatment: 0 ?Visit Information ?Patient Arrived: Wheel Chair ?Arrival Time: 08:52 ?Accompanied By: self ?Transfer Assistance: None ?Patient Identification Verified: Yes ?Secondary Verification Process Completed: Yes ?Patient Requires Transmission-Based Precautions: No ?Patient Has Alerts: No ?Electronic Signature(s) ?Signed: 01/27/2022 1:52:28 PM By: Yevonne Pax RN ?Entered By: Yevonne Pax on 01/27/2022 08:52:51 ?[name], [name] ([id number]) ?-------------------------------------------------------------------------------- ?Clinic Level of Care Assessment Details ?Patient Name: [name], [name] ?Date of Service: 01/27/2022 8:45 AM ?Medical Record Number: [id number] ?Patient Account Number: [id number] ?Date of Birth/Sex: Feb 22, 1986 (35 y.o. F) ?Treating RN: Yevonne Pax ?Primary Care  Oluwatobi Visser: Lynnell Chad Other Clinician: ?Referring Thayer Embleton: Lynnell Chad ?Treating Sayvion Vigen/Extender: Allen Derry ?Weeks in Treatment: 0 ?Clinic Level of Care Assessment Items ?TOOL 4 Quantity Score ?X - Use when only an EandM is performed on FOLLOW-UP visit 1 0 ?ASSESSMENTS - Nursing Assessment / Reassessment ?X - Reassessment of Co-morbidities (includes updates in patient status) 1 10 ?X- 1 5 ?Reassessment of Adherence to Treatment Plan ?ASSESSMENTS - Wound and Skin Assessment / Reassessment ?X - Simple Wound Assessment / Reassessment - one wound 1 5 ?X- 1 5 ?Complex Wound Assessment / Reassessment - multiple wounds ?X- 1 10 ?Dermatologic / Skin Assessment (not related to wound area) ?ASSESSMENTS - Focused Assessment ?[]  - Circumferential Edema Measurements - multi extremities 0 ?[]  - 0 ?Nutritional Assessment / Counseling / Intervention ?[]  - 0 ?Lower Extremity Assessment (monofilament, tuning fork, pulses) ?[]  - 0 ?Peripheral Arterial Disease Assessment (using hand held doppler) ?ASSESSMENTS - Ostomy and/or Continence Assessment and Care ?[]  - Incontinence Assessment and Management 0 ?[]  - 0 ?Ostomy Care Assessment and Management (repouching, etc.) ?PROCESS - Coordination of Care ?X - Simple Patient / Family Education for ongoing care 1 15 ?[]  - 0 ?Complex (extensive) Patient / Family Education for ongoing care ?X- 1 10 ?Staff obtains Consents, Records, Test Results / Process Orders ?[]  - 0 ?Staff telephones HHA, Nursing Homes / Clarify orders / etc ?[]  - 0 ?Routine Transfer to another Facility (non-emergent condition) ?[]  - 0 ?Routine Hospital Admission (non-emergent condition) ?X- 1 15 ?New Admissions / / Ordering NPWT, Apligraf, etc. ?[]  - 0 ?Emergency Hospital Admission (emergent condition) ?X- 1 10 ?Simple Discharge Coordination ?[]  - 0 ?Complex (extensive) Discharge Coordination ?PROCESS - Special Needs ?[]  - Pediatric / Minor Patient Management 0 ?[]  - 0 ?Isolation Patient  Management ?[]  - 0 ?Hearing / Language / Visual special needs ?[]  - 0 ?Assessment of Community assistance (transportation, D/C planning, etc.) ?[]  - 0 ?Additional assistance / Altered mentation ?[]  - 0 ?Support Surface(s) Assessment (bed, cushion, seat, etc.) ?INTERVENTIONS - Wound Cleansing / Measurement ?[name], [name] ( ) ?X- 1 5 ?Simple Wound Cleansing - one wound ?[]  - 0 ?  Complex Wound Cleansing - multiple wounds ?X- 1 5 ?Wound Imaging (photographs - any number of wounds) ?[]  - 0 ?Wound Tracing (instead of photographs) ?X- 1 5 ?Simple Wound Measurement - one wound ?[]  - 0 ?Complex Wound Measurement - multiple wounds ?INTERVENTIONS - Wound Dressings ?[]  - Small Wound Dressing one or multiple wounds 0 ?X- 1 15 ?Medium Wound Dressing one or multiple wounds ?[]  - 0 ?Large Wound Dressing one or multiple wounds ?[]  - 0 ?Application of Medications - topical ?[]  - 0 ?Application of Medications - injection ?INTERVENTIONS - Miscellaneous ?[]  - External ear exam 0 ?[]  - 0 ?Specimen Collection (cultures, biopsies, blood, body fluids, etc.) ?[]  - 0 ?Specimen(s) / Culture(s) sent or taken to Lab for analysis ?[]  - 0 ?Patient Transfer (multiple staff / / Similar devices) ?[]  - 0 ?Simple Staple / Suture removal (25 or less) ?[]  - 0 ?Complex Staple / Suture removal (26 or more) ?[]  - 0 ?Hypo / Hyperglycemic Management (close monitor of Blood Glucose) ?[]  - 0 ?Ankle / Brachial Index (ABI) - do not check if billed separately ?X- 1 5 ?Vital Signs ?Has the patient been seen at the hospital within the last three years: Yes ?Total Score: 120 ?Level Of Care: New/Established - Level ?4 ?Electronic Signature(s) ?Signed: 01/27/2022 1:52:28 PM By: RN ?Entered By: on 01/27/2022 09:34:13 ?[name], [name] ( ) ?-------------------------------------------------------------------------------- ?Encounter Discharge Information Details ?Patient Name: [name], [name] ?Date of Service: 01/27/2022 8:45  AM ?Medical Record Number: ?Patient Account Number: Nurse, adult ?Date of Birth/Sex: 07-Aug-1986 (35 y.o. F) ?Treating RN: ?Primary Care Christoffer Currier: Other Clinician: ?Referring Romen Yutzy: 01/29/2022 ?Treating Anneli Bing/Extender: Yevonne Pax ?Weeks in Treatment: 0 ?Encounter Discharge Information Items ?Discharge Condition: Stable ?Ambulatory Status: Wheelchair ?Discharge Destination: Home ?Transportation: Private Auto ?Accompanied By: self ?Schedule Follow-up Appointment: Yes ?Clinical Summary of Care: Patient Declined ?Electronic Signature(s) ?Signed: 01/27/2022 1:52:28 PM By: 01/29/2022 RN ?Entered By: [name] on 01/27/2022 09:35:07 ?[name], [name] (01/29/2022) ?-------------------------------------------------------------------------------- ?Lower Extremity Assessment Details ?Patient Name: [name], SCHAAD ?Date of Service: 01/27/2022 8:45 AM ?Medical Record Number: 09/17/1986 ?Patient Account Number: [id number] ?Date of Birth/Sex: September 05, 1986 (35 y.o. F) ?Treating RN: Lynnell Chad ?Primary Care Abbi Mancini: Allen Derry Other Clinician: ?Referring Shawnte Winton: 01/29/2022 ?Treating Ara Grandmaison/Extender: Yevonne Pax ?Weeks in Treatment: 0 ?Electronic Signature(s) ?Signed: 01/27/2022 1:52:28 PM By: 01/29/2022 RN ?Entered By: [name] on 01/27/2022 09:14:07 ?LATANGELA, MCCOMAS (01/29/2022) ?-------------------------------------------------------------------------------- ?Multi Wound Chart Details ?Patient Name: DONNI, OGLESBY ?Date of Service: 01/27/2022 8:45 AM ?Medical Record Number: [birth year]/1987 ?Patient Account Number: [id number] ?Date of Birth/Sex: July 07, 1986 (35 y.o. F) ?Treating RN: Lynnell Chad ?Primary Care Charod Slawinski: Allen Derry Other Clinician: ?Referring Larysa Pall: 01/29/2022 ?Treating Luara Faye/Extender: Yevonne Pax ?Weeks in Treatment: 0 ?Vital Signs ?Height(in): 67 ?Pulse(bpm): 112 ?Weight(lbs): 219 ?Blood Pressure(mmHg): 153/100 ?Body Mass Index(BMI):  34.3 ?Temperature(??F): 98.4 ?Respiratory Rate(breaths/min): 18 ?Photos: [N/A:N/A] ?Wound Location: Right Amputation Site - Below Knee N/A N/A ?Wounding Event: Gradually Appeared N/A N/A ?Primary Etiology: Abscess N/A N/A ?

## 2022-01-30 ENCOUNTER — Inpatient Hospital Stay: Payer: Medicaid Other | Admitting: Nurse Practitioner

## 2022-01-30 ENCOUNTER — Encounter: Payer: Medicaid Other | Attending: Nurse Practitioner

## 2022-01-30 DIAGNOSIS — X58XXXA Exposure to other specified factors, initial encounter: Secondary | ICD-10-CM | POA: Diagnosis not present

## 2022-01-30 DIAGNOSIS — T8131XA Disruption of external operation (surgical) wound, not elsewhere classified, initial encounter: Secondary | ICD-10-CM | POA: Insufficient documentation

## 2022-01-30 DIAGNOSIS — L97812 Non-pressure chronic ulcer of other part of right lower leg with fat layer exposed: Secondary | ICD-10-CM | POA: Diagnosis not present

## 2022-01-30 DIAGNOSIS — Z89511 Acquired absence of right leg below knee: Secondary | ICD-10-CM | POA: Diagnosis not present

## 2022-01-30 DIAGNOSIS — E11622 Type 2 diabetes mellitus with other skin ulcer: Secondary | ICD-10-CM | POA: Insufficient documentation

## 2022-01-30 DIAGNOSIS — I10 Essential (primary) hypertension: Secondary | ICD-10-CM | POA: Insufficient documentation

## 2022-01-30 DIAGNOSIS — E1143 Type 2 diabetes mellitus with diabetic autonomic (poly)neuropathy: Secondary | ICD-10-CM | POA: Diagnosis not present

## 2022-01-30 NOTE — Progress Notes (Signed)
[name] ([id number]) ?Visit Report for 01/30/2022 ?Arrival Information Details ?Patient Name: [name], [name] ?Date of Service: 01/30/2022 8:00 AM ?Medical Record Number: [id number] ?Patient Account Number: [id number] ?Date of Birth/Sex: 03-07-1986 (36 y.o. F) ?Treating RN: Hansel Feinstein ?Primary Care Nikola Blackston: Lynnell Chad Other Clinician: ?Referring Jeraline Marcinek: Lynnell Chad ?Treating Sachiko Methot/Extender: Allen Derry ?Weeks in Treatment: 0 ?Visit Information History Since Last Visit ?Added or deleted any medications: No ?Patient Arrived: Ambulatory ?Had a fall or experienced change in No ?Arrival Time: 08:00 ?activities of daily living that may affect ?Accompanied By: self ?risk of falls: ?Transfer Assistance: None ?Hospitalized since last visit: No ?Patient Identification Verified: Yes ?Has Dressing in Place as Prescribed: Yes ?Secondary Verification Process Completed: Yes ?Pain Present Now: No ?Patient Requires Transmission-Based Precautions: No ?Patient Has Alerts: No ?Electronic Signature(s) ?Signed: 01/30/2022 8:38:10 AM By: Hansel Feinstein ?Entered ByHansel Feinstein on 01/30/2022 08:32:03 ?[name] ([id number]) ?-------------------------------------------------------------------------------- ?Encounter Discharge Information Details ?Patient Name: [name], [name] ?Date of Service: 01/30/2022 8:00 AM ?Medical Record Number: [id number] ?Patient Account Number: [id number] ?Date of Birth/Sex: [birth year] (36 y.o. F) ?Treating RN: Hansel Feinstein ?Primary Care Kendrik Mcshan: Lynnell Chad Other Clinician: ?Referring Shaneece Stockburger: Lynnell Chad ?Treating Klyde Banka/Extender: Allen Derry ?Weeks in Treatment: 0 ?Encounter Discharge Information Items ?Discharge Condition: Stable ?Ambulatory Status: Wheelchair ?Discharge Destination: Home ?Transportation: Private Auto ?Accompanied By: self ?Schedule Follow-up Appointment: Yes ?Clinical Summary of Care: ?Electronic Signature(s) ?Signed: 01/30/2022 8:38:10 AM By: Hansel Feinstein ?Entered ByHansel Feinstein on 01/30/2022 08:37:36 ?[name] ([id number]) ?-------------------------------------------------------------------------------- ?Negative Pressure Wound Therapy Maintenance (NPWT) Details ?Patient Name: [name], [name] ?Date of Service: 01/30/2022 8:00 AM ?Medical Record Number: [id number] ?Patient Account Number: [id number] ?Date of Birth/Sex: [birth year] (36 y.o. F) ?Treating RN: Hansel Feinstein ?Primary Care Yuleimy Kretz: Lynnell Chad Other Clinician: ?Referring Lania Zawistowski: Lynnell Chad ?Treating Gian Ybarra/Extender: Allen Derry ?Weeks in Treatment: 0 ?NPWT Maintenance Performed for: Wound #1 Right Amputation Site - Below Knee ?Performed By: Hansel Feinstein, RN ?Type: Other ?Coverage Size (sq cm): 34.4 ?Pressure Type: Constant ?Pressure Setting: 125 mmHG ?Drain Type: None ?Sponge/Dressing Type: Foam, Black ?Date Initiated: 01/23/2022 ?Dressing Removed: Yes ?Quantity of Sponges/Gauze Removed: 1 ?Canister Changed: Yes ?Canister Exudate Volume: 50 ?Dressing Reapplied: No ?Quantity of Sponges/Gauze Inserted: 1 ?Respones To Treatment: tolerated well ?Days On NPWT: 8 ?Electronic Signature(s) ?Signed: 01/30/2022 8:38:10 AM By: Hansel Feinstein ?Entered ByHansel Feinstein on 01/30/2022 08:36:49 ?[name], [name] ([id number]) ?-------------------------------------------------------------------------------- ?Wound Assessment Details ?Patient Name: [name], [name] ?Date of Service: 01/30/2022 8:00 AM ?Medical Record Number: [id number] ?Patient Account Number: [id number] ?Date of Birth/Sex: Nov 02, 1985 (36 y.o. F) ?Treating RN: Hansel Feinstein ?Primary Care Caffie Sotto: Lynnell Chad Other Clinician: ?Referring Samaad Hashem: Lynnell Chad ?Treating Dawnell Bryant/Extender: Allen Derry ?Weeks in Treatment: 0 ?Wound Status ?Wound Number: 1 ?Primary Etiology: Abscess ?Wound Location: Right Amputation Site - Below Knee ?Wound Status: Open ?Wounding Event: Gradually Appeared ?Comorbid History: Hypertension, Type II Diabetes ?Date Acquired: 01/14/2022 ?Weeks Of Treatment:  0 ?Clustered Wound: No ?Wound Measurements ?Length: (cm) 4.3 ?Width: (cm) 8 ?Depth: (cm) 3.9 ?Area: (cm?) 27.018 ?Volume: (cm?) 105.369 ?% Reduction in Area: 0% ?% Reduction in Volume: 0% ?Epithelialization: None ?Wound Description ?Classification: Full Thickness With Exposed Support Structures ?Exudate Amount: Medium ?Exudate Type: Serosanguineous ?Exudate Color: red, brown ?Foul Odor After Cleansing: No ?Slough/Fibrino Yes ?Wound Bed ?Granulation Amount: Medium (34-66%) Exposed Structure ?Granulation Quality: Red, Pink ?Fascia Exposed: No ?Necrotic Amount: Medium (34-66%) ?Fat Layer (Subcutaneous Tissue) Exposed: Yes ?Necrotic Quality: Adherent Slough ?Tendon Exposed: No ?Muscle Exposed: Yes ?Necrosis of Muscle: No ?Joint Exposed: No ?Bone Exposed: No ?Treatment Notes ?Wound #1 (Amputation Site -  Below Knee) Wound Laterality: Right ?Cleanser ?Peri-Wound Care ?Topical ?Primary Dressing ?Secondary Dressing ?Secured With ?Compression Wrap ?Compression Stockings ?Add-Ons ?Electronic Signature(s) ?Signed: 01/30/2022 8:38:10 AM By: Hansel Feinstein ?Entered ByHansel Feinstein on 01/30/2022 08:32:21 ?

## 2022-02-02 DIAGNOSIS — E11622 Type 2 diabetes mellitus with other skin ulcer: Secondary | ICD-10-CM | POA: Diagnosis not present

## 2022-02-02 NOTE — Progress Notes (Signed)
[name], [name] ([id number]) ?Visit Report for 02/02/2022 ?Arrival Information Details ?Patient Name: [name], [name] ?Date of Service: 02/02/2022 8:00 AM ?Medical Record Number: [id number] ?Patient Account Number: [id number] ?Date of Birth/Sex: 05-05-1986 (35 y.o. F) ?Treating RN: Hansel Feinstein ?Primary Care Margeart Allender: Lynnell Chad Other Clinician: ?Referring Dae Antonucci: Lynnell Chad ?Treating Gertie Broerman/Extender: Allen Derry ?Weeks in Treatment: 0 ?Visit Information History Since Last Visit ?Added or deleted any medications: No ?Patient Arrived: Wheel Chair ?Had a fall or experienced change in No ?Arrival Time: 08:02 ?activities of daily living that may affect ?Accompanied By: self ?risk of falls: ?Transfer Assistance: EasyPivot Patient ?Hospitalized since last visit: No ?Lift ?Has Dressing in Place as Prescribed: Yes ?Patient Identification Verified: Yes ?Pain Present Now: No ?Secondary Verification Process Completed: Yes ?Patient Requires Transmission-Based No ?Precautions: ?Patient Has Alerts: Yes ?Patient Alerts: DIABETIC ?Electronic Signature(s) ?Signed: 02/02/2022 4:00:30 PM By: Hansel Feinstein ?Entered ByHansel Feinstein on 02/02/2022 08:09:48 ?[name], [name] ([id number]) ?-------------------------------------------------------------------------------- ?Encounter Discharge Information Details ?Patient Name: [name], [name] ?Date of Service: 02/02/2022 8:00 AM ?Medical Record Number: [id number] ?Patient Account Number: [id number] ?Date of Birth/Sex: [birth year] (35 y.o. F) ?Treating RN: Hansel Feinstein ?Primary Care Darian Cansler: Lynnell Chad Other Clinician: ?Referring Monquie Fulgham: Lynnell Chad ?Treating Iseah Plouff/Extender: Allen Derry ?Weeks in Treatment: 0 ?Encounter Discharge Information Items ?Discharge Condition: Stable ?Ambulatory Status: Wheelchair ?Discharge Destination: Home ?Transportation: Private Auto ?Accompanied By: self ?Schedule Follow-up Appointment: Yes ?Clinical Summary of Care: ?Electronic Signature(s) ?Signed: 02/02/2022  4:00:30 PM By: Hansel Feinstein ?Entered ByHansel Feinstein on 02/02/2022 08:33:42 ?[name], [name] ([id number]) ?-------------------------------------------------------------------------------- ?Negative Pressure Wound Therapy Maintenance (NPWT) Details ?Patient Name: [name], [name] ?Date of Service: 02/02/2022 8:00 AM ?Medical Record Number: [id number] ?Patient Account Number: [id number] ?Date of Birth/Sex: 1986-03-01 (35 y.o. F) ?Treating RN: Hansel Feinstein ?Primary Care Jalien Weakland: Lynnell Chad Other Clinician: ?Referring Atianna Haidar: Lynnell Chad ?Treating Tiann Saha/Extender: Allen Derry ?Weeks in Treatment: 0 ?NPWT Maintenance Performed for: Wound #1 Right Amputation Site - Below Knee ?Performed By: Hansel Feinstein, RN ?Type: Other ?Coverage Size (sq cm): 34.4 ?Pressure Type: Constant ?Pressure Setting: 125 mmHG ?Drain Type: None ?Sponge/Dressing Type: Foam, Black ?Date Initiated: 01/23/2022 ?Dressing Removed: Yes ?Quantity of Sponges/Gauze Removed: 1 ?Canister Changed: Yes ?Canister Exudate Volume: 50 ?Dressing Reapplied: Yes ?Quantity of Sponges/Gauze Inserted: 1 ?Respones To Treatment: tolerated well ?Days On NPWT: 11 ?Electronic Signature(s) ?Signed: 02/02/2022 4:00:30 PM By: Hansel Feinstein ?Entered ByHansel Feinstein on 02/02/2022 08:32:33 ?[name], [name] ([id number]) ?-------------------------------------------------------------------------------- ?Wound Assessment Details ?Patient Name: [name], [name] ?Date of Service: 02/02/2022 8:00 AM ?Medical Record Number: [id number] ?Patient Account Number: [id number] ?Date of Birth/Sex: 02-Sep-1986 (35 y.o. F) ?Treating RN: Hansel Feinstein ?Primary Care Brinton Brandel: Lynnell Chad Other Clinician: ?Referring Yavuz Kirby: Lynnell Chad ?Treating Leilynn Pilat/Extender: Allen Derry ?Weeks in Treatment: 0 ?Wound Status ?Wound Number: 1 ?Primary Etiology: Abscess ?Wound Location: Right Amputation Site - Below Knee ?Wound Status: Open ?Wounding Event: Gradually Appeared ?Comorbid History: Hypertension, Type II  Diabetes ?Date Acquired: 01/14/2022 ?Weeks Of Treatment: 0 ?Clustered Wound: No ?Wound Measurements ?Length: (cm) 4.3 ?Width: (cm) 8 ?Depth: (cm) 3.9 ?Area: (cm?) 27.018 ?Volume: (cm?) 105.369 ?% Reduction in Area: 0% ?% Reduction in Volume: 0% ?Epithelialization: None ?Wound Description ?Classification: Full Thickness With Exposed Support Structures ?Exudate Amount: Medium ?Exudate Type: Serosanguineous ?Exudate Color: red, brown ?Foul Odor After Cleansing: No ?Slough/Fibrino Yes ?Wound Bed ?Granulation Amount: Medium (34-66%) Exposed Structure ?Granulation Quality: Red, Pink ?Fascia Exposed: No ?Necrotic Amount: Medium (34-66%) ?Fat Layer (Subcutaneous Tissue) Exposed: Yes ?Necrotic Quality: Adherent Slough ?Tendon Exposed: No ?Muscle Exposed: Yes ?Necrosis of Muscle: No ?Joint Exposed: No ?Bone Exposed: No ?Treatment  Notes ?Wound #1 (Amputation Site - Below Knee) Wound Laterality: Right ?Cleanser ?Peri-Wound Care ?Topical ?Primary Dressing ?Secondary Dressing ?Secured With ?Compression Wrap ?Compression Stockings ?Add-Ons ?Electronic Signature(s) ?Signed: 02/02/2022 4:00:30 PM By: Hansel Feinstein ?Entered ByHansel Feinstein on 02/02/2022 08:31:52 ?

## 2022-02-03 NOTE — Progress Notes (Signed)
JILLAINE, HAMADA ([id number]) ?Visit Report for 02/02/2022 ?Physician Orders Details ?Patient Name: [name], [name] ?Date of Service: 02/02/2022 8:00 AM ?Medical Record Number: [id number] ?Patient Account Number: [id number] ?Date of Birth/Sex: 1985/10/18 (35 y.o. F) ?Treating RN: Donnamarie Poag ?Primary Care Provider: Jac Canavan Other Clinician: ?Referring Provider: Jac Canavan ?Treating Provider/Extender: Jeri Cos ?Weeks in Treatment: 0 ?Verbal / Phone Orders: No ?Diagnosis Coding ?Follow-up Appointments ?o Return Appointment in 1 week. ?o Nurse Visit as needed - beginning of week ?Bathing/ Shower/ Hygiene ?o No tub bath. ?Negative Pressure Wound Therapy ?o Wound VAC settings at 115mmHg continuous pressure. Use foam to wound cavity. Please order WHITE foam to fill any ?tunnel/s and/or undermining when necessary. Change VAC dressing 2 X WEEK. Change canister as indicated when full. ?o Number of foam/gauze pieces used in the dressing = ?o Other: - wet to dry if wound vac malfunctions ?Wound Treatment ?Electronic Signature(s) ?Signed: 02/02/2022 4:00:30 PM By: Donnamarie Poag ?Signed: 02/03/2022 4:41:50 PM By: Worthy Keeler PA-C ?Entered ByDonnamarie Poag on 02/02/2022 08:33:25 ?[name], [name] ([id number]) ?-------------------------------------------------------------------------------- ?SuperBill Details ?Patient Name: [name], [name] ?Date of Service: 02/02/2022 ?Medical Record Number: [id number] ?Patient Account Number: [id number] ?Date of Birth/Sex: 1986/03/31 (35 y.o. F) ?Treating RN: Donnamarie Poag ?Primary Care Provider: Jac Canavan Other Clinician: ?Referring Provider: Jac Canavan ?Treating Provider/Extender: Jeri Cos ?Weeks in Treatment: 0 ?Diagnosis Coding ?ICD-10 Codes ?Code Description ?E11.622 Type 2 diabetes mellitus with other skin ulcer ?T81.31XA Disruption of external operation (surgical) wound, not elsewhere classified, initial encounter ?[id number] Non-pressure chronic ulcer of other part of right  lower leg with fat layer exposed ?E11.43 Type 2 diabetes mellitus with diabetic autonomic (poly)neuropathy ?I10 Essential (primary) hypertension ?Facility Procedures ?CPT4 Code: [id number] ?Description: [id number] - WOUND VAC-50 SQ CM OR LESS ?Modifier: ?Quantity: 1 ?CPT4 Code: ?Description: ICD-10 Diagnosis Description L97.812 Non-pressure chronic ulcer of other part of right lower leg with fat layer T81.31XA Disruption of external operation (surgical) wound, not elsewhere classifie ?Modifier: exposed d, initial encounter ?Quantity: ?Physician Procedures ?CPT4 Code: [id number] ?Description: 97605 - WC PHYS TX WOUND VAC < 50 SQ CM ?Modifier: ?Quantity: 1 ?CPT4 Code: ?Description: ICD-10 Diagnosis Description L97.812 Non-pressure chronic ulcer of other part of right lower leg with fat layer e T81.31XA Disruption of external operation (surgical) wound, not elsewhere classified, ?Modifier: xposed initial encounter ?Quantity: ?Electronic Signature(s) ?Signed: 02/02/2022 4:00:30 PM By: Donnamarie Poag ?Signed: 02/03/2022 4:41:50 PM By: Worthy Keeler PA-C ?Entered ByDonnamarie Poag on 02/02/2022 08:33:56 ?

## 2022-02-06 ENCOUNTER — Encounter: Payer: Medicaid Other | Admitting: Internal Medicine

## 2022-02-06 ENCOUNTER — Ambulatory Visit: Payer: Self-pay | Admitting: Internal Medicine

## 2022-02-06 DIAGNOSIS — E11622 Type 2 diabetes mellitus with other skin ulcer: Secondary | ICD-10-CM | POA: Diagnosis not present

## 2022-02-07 NOTE — Progress Notes (Signed)
ERSEL, WADLEIGH ([id number]) ?Visit Report for 02/06/2022 ?Physical Exam Details ?Patient Name: [name], [name] ?Date of Service: 02/06/2022 8:00 AM ?Medical Record Number: [id number] ?Patient Account Number: [id number] ?Date of Birth/Sex: 1985-10-06 (35 y.o. F) ?Treating RN: Yevonne Pax ?Primary Care Provider: Lynnell Chad Other Clinician: ?Referring Provider: Lynnell Chad ?Treating Provider/Extender: Maxwell Caul ?Weeks in Treatment: 1 ?Constitutional ?Patient is hypertensive.Marland Kitchen Heart rate Tachycardic.Marland Kitchen Respirations regular, non-labored and within target range.. Temperature is normal and within ?the target range for the patient.Marland Kitchen appears in no distress. Does not appear to be systemically unwell. ?Cardiovascular ?Right popliteal and femoral pulse are palpable.Marland Kitchen ?Notes ?Wound exam; right BKA amputation site. Vast majority of the surface of this has healthy granulation however superiorly there is a deeper aspect. ?At the base of this there is exposed antibiotic beads tunnel at roughly 10:00 2-1/2 cm. There is no palpable bone. There is still some drainage. No ?surrounding tenderness, no crepitus. Appears to have lymphedema in the right leg but even given this there is no evidence of infection. Her ?vascular assessment was normal. ?Electronic Signature(s) ?Signed: 02/07/2022 6:42:33 AM By: Baltazar Najjar MD ?Entered By: Baltazar Najjar on 02/06/2022 08:44:41 ?GAZELLE, TOWE ([id number]) ?-------------------------------------------------------------------------------- ?Physician Orders Details ?Patient Name: MAYERLI, KIRST ?Date of Service: 02/06/2022 8:00 AM ?Medical Record Number: [id number] ?Patient Account Number: [id number] ?Date of Birth/Sex: 09-05-1986 (35 y.o. F) ?Treating RN: Yevonne Pax ?Primary Care Provider: Lynnell Chad Other Clinician: ?Referring Provider: Lynnell Chad ?Treating Provider/Extender: Maxwell Caul ?Weeks in Treatment: 1 ?Verbal / Phone Orders: No ?Diagnosis Coding ?ICD-10 Coding ?Code  Description ?E11.622 Type 2 diabetes mellitus with other skin ulcer ?T81.31XA Disruption of external operation (surgical) wound, not elsewhere classified, initial encounter ?B34.193 Non-pressure chronic ulcer of other part of right lower leg with fat layer exposed ?E11.43 Type 2 diabetes mellitus with diabetic autonomic (poly)neuropathy ?I10 Essential (primary) hypertension ?Follow-up Appointments ?o Return Appointment in 1 week. ?o Nurse Visit as needed - thursday ?Bathing/ Shower/ Hygiene ?o No tub bath. ?Negative Pressure Wound Therapy ?o Wound VAC settings at continuous pressure. Use foam to wound cavity. Please order WHITE foam to fill any ?tunnel/s and/or undermining when necessary. Change VAC dressing 2 X WEEK. Change canister as indicated when full. ?o Number of foam/gauze pieces used in the dressing = - 2 ?o Other: - wet to dry if wound vac malfunctions ?Wound Treatment ?Electronic Signature(s) ?Signed: 02/06/2022 9:58:12 AM By: Yevonne Pax RN ?Signed: 02/07/2022 6:42:33 AM By: Baltazar Najjar MD ?Entered ByYevonne Pax on 02/06/2022 09:58:12 ?MELANI, BRISBANE ([id number]) ?-------------------------------------------------------------------------------- ?Problem List Details ?Patient Name: MADDYX, VALLIE ?Date of Service: 02/06/2022 8:00 AM ?Medical Record Number: [id number] ?Patient Account Number: [id number] ?Date of Birth/Sex: 02/23/1986 (35 y.o. F) ?Treating RN: Yevonne Pax ?Primary Care Provider: Lynnell Chad Other Clinician: ?Referring Provider: Lynnell Chad ?Treating Provider/Extender: Maxwell Caul ?Weeks in Treatment: 1 ?Active Problems ?ICD-10 ?Encounter ?Code Description Active Date MDM ?Diagnosis ?E11.622 Type 2 diabetes mellitus with other skin ulcer 01/27/2022 No Yes ?T81.31XA Disruption of external operation (surgical) wound, not elsewhere 01/27/2022 No Yes ?classified, initial encounter ?S34.196 Non-pressure chronic ulcer of other part of right lower leg with fat layer  01/27/2022 No Yes ?exposed ?E11.43 Type 2 diabetes mellitus with diabetic autonomic (poly)neuropathy 01/27/2022 No Yes ?I10 Essential (primary) hypertension 01/27/2022 No Yes ?Inactive Problems ?Resolved Problems ?Electronic Signature(s) ?Signed: 02/07/2022 6:42:33 AM By: Baltazar Najjar MD ?Entered By: Baltazar Najjar on 02/06/2022 08:42:15 ?[place], PAONE ([id number]) ?-------------------------------------------------------------------------------- ?Progress Note Details ?Patient Name: ELIRA, COLASANTI ?Date of Service: 02/06/2022 8:00 AM ?Medical Record Number: [id number] ?  Patient Account Number: [id number] ?Date of Birth/Sex: 1986/02/09 (35 y.o. F) ?Treating RN: Yevonne Pax ?Primary Care Provider: Lynnell Chad Other Clinician: ?Referring Provider: Lynnell Chad ?Treating Provider/Extender: Maxwell Caul ?Weeks in Treatment: 1 ?Subjective ?Objective ?Constitutional ?Patient is hypertensive.Marland Kitchen Heart rate Tachycardic.Marland Kitchen Respirations regular, non-labored and within target range.. Temperature is normal and within ?the target range for the patient.Marland Kitchen appears in no distress. Does not appear to be systemically unwell. ?Vitals Time Taken: 8:17 AM, Height: 67 in, Weight: 219 lbs, BMI: 34.3, Temperature: 98.25 ??F, Pulse: 112 bpm, Respiratory Rate: 18 ?breaths/min, Blood Pressure: 150/101 mmHg. ?Cardiovascular ?Right popliteal and femoral pulse are palpable.Marland Kitchen ?General Notes: Wound exam; right BKA amputation site. Vast majority of the surface of this has healthy granulation however superiorly there is a ?deeper aspect. At the base of this there is exposed antibiotic beads tunnel at roughly 10:00 2-1/2 cm. There is no palpable bone. There is still ?some drainage. No surrounding tenderness, no crepitus. Appears to have lymphedema in the right leg but even given this there is no evidence of ?infection. Her vascular assessment was normal. ?Integumentary (Hair, Skin) ?Wound #1 status is Open. Original cause of wound was Gradually  Appeared. The date acquired was: 01/14/2022. The wound has been in treatment ?1 weeks. The wound is located on the Right Amputation Site - Below Knee. The wound measures 5cm length x 8cm width x 2.9cm depth; ?31.416cm^2 area and 91.106cm^3 volume. There is muscle and Fat Layer (Subcutaneous Tissue) exposed. There is no tunneling or undermining ?noted. There is a medium amount of serosanguineous drainage noted. There is medium (34-66%) red, pink granulation within the wound bed. ?There is a medium (34-66%) amount of necrotic tissue within the wound bed including Adherent Slough. ?Assessment ?Active Problems ?ICD-10 ?Type 2 diabetes mellitus with other skin ulcer ?Disruption of external operation (surgical) wound, not elsewhere classified, initial encounter ?Non-pressure chronic ulcer of other part of right lower leg with fat layer exposed ?Type 2 diabetes mellitus with diabetic autonomic (poly)neuropathy ?Essential (primary) hypertension ?Plan ?1. We are using the wound VAC ?2. I did not think that anything was worth culturing. The patient is on Keflex prescribed by her surgeons ?3. Follows up with her surgeon/orthopedics at [facility]. They are the ones who dealt with her abscess. ?4. I wonder whether this requires additional imaging. There is a tunnel with some scant serous drainage but no palpable bone nevertheless I am ?uncertain whether she needs an additional CT scan and would defer to the surgeons on 5/23. Noteworthy that the patient says she had a CT scan ?DONISE, WOODLE ([id number]) ?but I cannot see record of it in care everywhere ?5. If there were cultures of the abscess done at the time of surgery I cannot see those either ?Electronic Signature(s) ?Signed: 02/07/2022 6:42:33 AM By: Baltazar Najjar MD ?Entered By: Baltazar Najjar on 02/06/2022 08:46:28 ?LARA, PALINKAS ([id number]) ?-------------------------------------------------------------------------------- ?SuperBill Details ?Patient Name: ISABELLE, MATT ?Date  of Service: 02/06/2022 ?Medical Record Number: [id number] ?Patient Account Number: [id number] ?Date of Birth/Sex: Jan 29, 1986 (35 y.o. F) ?Treating RN: Yevonne Pax ?Primary Care Provider: Lynnell Chad Other Clinici

## 2022-02-08 NOTE — Progress Notes (Signed)
WINOLA, DRUM ([id number]) ?Visit Report for 02/06/2022 ?Arrival Information Details ?Patient Name: [name], [name] ?Date of Service: 02/06/2022 8:00 AM ?Medical Record Number: [id number] ?Patient Account Number: [id number] ?Date of Birth/Sex: 12/05/1985 (35 y.o. F) ?Treating RN: Yevonne Pax ?Primary Care Anaid Haney: Lynnell Chad Other Clinician: ?Referring Charlisa Cham: Lynnell Chad ?Treating Tru Rana/Extender: Maxwell Caul ?Weeks in Treatment: 1 ?Visit Information History Since Last Visit ?All ordered tests and consults were completed: No ?Patient Arrived: Wheel Chair ?Added or deleted any medications: No ?Arrival Time: 08:13 ?Any new allergies or adverse reactions: No ?Accompanied By: self ?Had a fall or experienced change in No ?Transfer Assistance: None ?activities of daily living that may affect ?Patient Identification Verified: Yes ?risk of falls: ?Secondary Verification Process Completed: Yes ?Signs or symptoms of abuse/neglect since last visito No ?Patient Requires Transmission-Based Precautions: No ?Hospitalized since last visit: No ?Patient Has Alerts: Yes ?Implantable device outside of the clinic excluding No ?Patient Alerts: DIABETIC ?cellular tissue based products placed in the center ?since last visit: ?Has Dressing in Place as Prescribed: Yes ?Pain Present Now: No ?Electronic Signature(s) ?Signed: 02/08/2022 3:40:11 PM By: Yevonne Pax RN ?Entered By: Yevonne Pax on 02/06/2022 08:16:41 ?[name], [name] ([id number]) ?-------------------------------------------------------------------------------- ?Clinic Level of Care Assessment Details ?Patient Name: [name], [name] ?Date of Service: 02/06/2022 8:00 AM ?Medical Record Number: [id number] ?Patient Account Number: [id number] ?Date of Birth/Sex: 10-19-1985 (35 y.o. F) ?Treating RN: Yevonne Pax ?Primary Care Jadien Lehigh: Lynnell Chad Other Clinician: ?Referring Kayn Haymore: Lynnell Chad ?Treating Scheryl Sanborn/Extender: Maxwell Caul ?Weeks in Treatment: 1 ?Clinic  Level of Care Assessment Items ?TOOL 1 Quantity Score ?[]  - Use when EandM and Procedure is performed on INITIAL visit 0 ?ASSESSMENTS - Nursing Assessment / Reassessment ?[]  - General Physical Exam (combine w/ comprehensive assessment (listed just below) when performed on new ?0 ?pt. evals) ?[]  - 0 ?Comprehensive Assessment (HX, ROS, Risk Assessments, Wounds Hx, etc.) ?ASSESSMENTS - Wound and Skin Assessment / Reassessment ?[]  - Dermatologic / Skin Assessment (not related to wound area) 0 ?ASSESSMENTS - Ostomy and/or Continence Assessment and Care ?[]  - Incontinence Assessment and Management 0 ?[]  - 0 ?Ostomy Care Assessment and Management (repouching, etc.) ?PROCESS - Coordination of Care ?[]  - Simple Patient / Family Education for ongoing care 0 ?[]  - 0 ?Complex (extensive) Patient / Family Education for ongoing care ?[]  - 0 ?Staff obtains Consents, Records, Test Results / Process Orders ?[]  - 0 ?Staff telephones HHA, Nursing Homes / Clarify orders / etc ?[]  - 0 ?Routine Transfer to another Facility (non-emergent condition) ?[]  - 0 ?Routine Hospital Admission (non-emergent condition) ?[]  - 0 ?New Admissions / / Ordering NPWT, Apligraf, etc. ?[]  - 0 ?Emergency Hospital Admission (emergent condition) ?PROCESS - Special Needs ?[]  - Pediatric / Minor Patient Management 0 ?[]  - 0 ?Isolation Patient Management ?[]  - 0 ?Hearing / Language / Visual special needs ?[]  - 0 ?Assessment of Community assistance (transportation, D/C planning, etc.) ?[]  - 0 ?Additional assistance / Altered mentation ?[]  - 0 ?Support Surface(s) Assessment (bed, cushion, seat, etc.) ?INTERVENTIONS - Miscellaneous ?[]  - External ear exam 0 ?[]  - 0 ?Patient Transfer (multiple staff / / Similar devices) ?[]  - 0 ?Simple Staple / Suture removal (25 or less) ?[]  - 0 ?Complex Staple / Suture removal (26 or more) ?[]  - 0 ?Hypo/Hyperglycemic Management (do not check if billed separately) ?[]  - 0 ?Ankle / Brachial Index  (ABI) - do not check if billed separately ?Has the patient been seen at the hospital within the last three years: Yes ?Total Score:  0 ?Level Of Care: ____ ?HOA, BRIGGS ([id number]) ?Electronic Signature(s) ?Signed: 02/08/2022 3:40:11 PM By: Yevonne Pax RN ?Entered By: Yevonne Pax on 02/06/2022 09:59:00 ?[name], [name] ([id number]) ?-------------------------------------------------------------------------------- ?Encounter Discharge Information Details ?Patient Name: [name], [name] ?Date of Service: 02/06/2022 8:00 AM ?Medical Record Number: [id number] ?Patient Account Number: [id number] ?Date of Birth/Sex: January 19, 1986 (35 y.o. F) ?Treating RN: Yevonne Pax ?Primary Care Judiann Celia: Lynnell Chad Other Clinician: ?Referring Garcia Dalzell: Lynnell Chad ?Treating [place]/Extender: Maxwell Caul ?Weeks in Treatment: 1 ?Encounter Discharge Information Items ?Discharge Condition: Stable ?Ambulatory Status: Wheelchair ?Discharge Destination: Home ?Transportation: Private Auto ?Accompanied By: self ?Schedule Follow-up Appointment: Yes ?Clinical Summary of Care: Patient Declined ?Electronic Signature(s) ?Signed: 02/06/2022 10:00:12 AM By: Yevonne Pax RN ?Entered By: Yevonne Pax on 02/06/2022 10:00:12 ?[name], [name] ([id number]) ?-------------------------------------------------------------------------------- ?Lower Extremity Assessment Details ?Patient Name: [name], [name] ?Date of Service: 02/06/2022 8:00 AM ?Medical Record Number: [id number] ?Patient Account Number: [id number] ?Date of Birth/Sex: 1985-11-29 (35 y.o. F) ?Treating RN: Yevonne Pax ?Primary Care Stpehen Petitjean: Lynnell Chad Other Clinician: ?Referring Tameyah Koch: Lynnell Chad ?Treating Lillyth Spong/Extender: Maxwell Caul ?Weeks in Treatment: 1 ?Electronic Signature(s) ?Signed: 02/08/2022 3:40:11 PM By: Yevonne Pax RN ?Entered By: Yevonne Pax on 02/06/2022 08:25:35 ?[name], [name]  ([id number]) ?-------------------------------------------------------------------------------- ?Multi Wound Chart Details ?Patient Name: [name], [name] ?Date of Service: 02/06/2022 8:00 AM ?Medical Record Number: [id number] ?Patient Account Number: [id number] ?Date of Birth/Sex: [birth year] (35 y.o. F) ?Treating RN: Yevonne Pax ?Primary Care Alara Daniel: Lynnell Chad Other Clinician: ?Referring Aletha Allebach: Lynnell Chad ?Treating Randa Riss/Extender: Maxwell Caul ?Weeks in Treatment: 1 ?Vital Signs ?Height(in): 67 ?Pulse(bpm): 112 ?Weight(lbs): 219 ?Blood Pressure(mmHg): 150/101 ?Body Mass Index(BMI): 34.3 ?Temperature(??F): 98.25 ?Respiratory Rate(breaths/min): 18 ?Photos: [N/A:N/A] ?Wound Location: Right Amputation Site - Below Knee N/A N/A ?Wounding Event: Gradually Appeared N/A N/A ?Primary Etiology: Abscess N/A N/A ?Comorbid History: Hypertension, Type II Diabetes N/A N/A ?Date Acquired: 01/14/2022 N/A N/A ?Weeks of Treatment: 1 N/A N/A ?Wound Status: Open N/A N/A ?Wound Recurrence: No N/A N/A ?Measurements L x W x D (cm) 5x8x2.9 N/A N/A ?Area (cm?) : 31.416 N/A N/A ?Volume (cm?) : 91.106 N/A N/A ?% Reduction in Area: -16.30% N/A N/A ?% Reduction in Volume: 13.50% N/A N/A ?Classification: Full Thickness With Exposed N/A N/A ?Support Structures ?Exudate Amount: Medium N/A N/A ?Exudate Type: Serosanguineous N/A N/A ?Exudate Color: red, brown N/A N/A ?Granulation Amount: Medium (34-66%) N/A N/A ?Granulation Quality: Red, Pink N/A N/A ?Necrotic Amount: Medium (34-66%) N/A N/A ?Exposed Structures: ?Fat Layer (Subcutaneous Tissue): N/A N/A ?Yes ?Muscle: Yes ?Fascia: No ?Tendon: No ?Joint: No ?Bone: No ?Epithelialization: None N/A N/A ?Treatment Notes ?Electronic Signature(s) ?Signed: 02/08/2022 3:40:11 PM By: Yevonne Pax RN ?Entered By: Yevonne Pax on 02/06/2022 08:27:51 ?[name], [name] ([id number]) ?-------------------------------------------------------------------------------- ?Multi-Disciplinary Care Plan  Details ?Patient Name: [name], [name] ?Date of Service: 02/06/2022 8:00 AM ?Medical Record Number: [id number] ?Patient Account Number: [id number] ?Date of Birth/Sex: [birth year] (35 y.o. F) ?Treating RN: Yevonne Pax ?Primary Care Tiffiney Sparrow: Lynnell Chad Other Clinician: ?Referring Keiondra Brookover: Lynnell Chad

## 2022-02-09 DIAGNOSIS — E11622 Type 2 diabetes mellitus with other skin ulcer: Secondary | ICD-10-CM | POA: Diagnosis not present

## 2022-02-09 NOTE — Progress Notes (Signed)
[name] ([id number]) ?Visit Report for 02/09/2022 ?Physician Orders Details ?Patient Name: [name], [name] ?Date of Service: 02/09/2022 8:00 AM ?Medical Record Number: [id number] ?Patient Account Number: [id number] ?Date of Birth/Sex: 06/13/1986 (35 y.o. F) ?Treating RN: Yevonne Pax ?Primary Care Provider: Lynnell Chad Other Clinician: ?Referring Provider: Lynnell Chad ?Treating Provider/Extender: Allen Derry ?Weeks in Treatment: 1 ?Verbal / Phone Orders: No ?Diagnosis Coding ?Follow-up Appointments ?o Return Appointment in 1 week. ?o Nurse Visit as needed - thursday ?Bathing/ Shower/ Hygiene ?o No tub bath. ?Negative Pressure Wound Therapy ?o Wound VAC settings at continuous pressure. Use foam to wound cavity. Please order WHITE foam to fill any ?tunnel/s and/or undermining when necessary. Change VAC dressing 2 X WEEK. Change canister as indicated when full. ?o Number of foam/gauze pieces used in the dressing = - 2 ?o Other: - wet to dry if wound vac malfunctions ?Wound Treatment ?Patient Medications ?Allergies: almond, bee venom protein (honey bee), lemonene ?Notifications Medication Indication Start End ?Levaquin 02/09/2022 ?DOSE 1 - oral 750 mg tablet - 1 tablet oral taken 1 time per day for 14 days ?Electronic Signature(s) ?Signed: 02/09/2022 9:40:59 AM By: Lenda Kelp PA-C ?Previous Signature: 02/09/2022 8:42:31 AM Version By: Yevonne Pax RN ?Entered By: Lenda Kelp on 02/09/2022 09:40:59 ?LAROSA, RHINES ([id number]) ?-------------------------------------------------------------------------------- ?SuperBill Details ?Patient Name: [name] ?Date of Service: 02/09/2022 ?Medical Record Number: [id number] ?Patient Account Number: [id number] ?Date of Birth/Sex: 02-07-1986 (35 y.o. F) ?Treating RN: Yevonne Pax ?Primary Care Provider: Lynnell Chad Other Clinician: ?Referring Provider: Lynnell Chad ?Treating Provider/Extender: Allen Derry ?Weeks in Treatment: 1 ?Diagnosis  Coding ?ICD-10 Codes ?Code Description ?E11.622 Type 2 diabetes mellitus with other skin ulcer ?T81.31XA Disruption of external operation (surgical) wound, not elsewhere classified, initial encounter ?G29.528 Non-pressure chronic ulcer of other part of right lower leg with fat layer exposed ?E11.43 Type 2 diabetes mellitus with diabetic autonomic (poly)neuropathy ?I10 Essential (primary) hypertension ?Facility Procedures ?CPT4 Code: 41324401 ?Description: 02725 - WOUND VAC-50 SQ CM OR LESS ?Modifier: ?Quantity: 1 ?Electronic Signature(s) ?Signed: 02/09/2022 8:43:38 AM By: Yevonne Pax RN ?Entered By: Yevonne Pax on 02/09/2022 08:43:37 ?

## 2022-02-09 NOTE — Progress Notes (Signed)
MANDEE, PLUTA ([id number]) ?Visit Report for 02/09/2022 ?Arrival Information Details ?Patient Name: [name], [name] ?Date of Service: 02/09/2022 8:00 AM ?Medical Record Number: [id number] ?Patient Account Number: [id number] ?Date of Birth/Sex: 10-30-1985 (36 y.o. F) ?Treating RN: Yevonne Pax ?Primary Care Karagan Lehr: Lynnell Chad Other Clinician: ?Referring Wilferd Ritson: Lynnell Chad ?Treating Jannae Fagerstrom/Extender: Allen Derry ?Weeks in Treatment: 1 ?Visit Information History Since Last Visit ?All ordered tests and consults were completed: No ?Patient Arrived: Wheel Chair ?Added or deleted any medications: No ?Arrival Time: 08:00 ?Any new allergies or adverse reactions: No ?Accompanied By: self ?Had a fall or experienced change in No ?Transfer Assistance: None ?activities of daily living that may affect ?Patient Identification Verified: Yes ?risk of falls: ?Secondary Verification Process Completed: Yes ?Signs or symptoms of abuse/neglect since last visito No ?Patient Requires Transmission-Based Precautions: No ?Hospitalized since last visit: No ?Patient Has Alerts: Yes ?Implantable device outside of the clinic excluding No ?Patient Alerts: DIABETIC ?cellular tissue based products placed in the center ?since last visit: ?Has Dressing in Place as Prescribed: Yes ?Pain Present Now: No ?Electronic Signature(s) ?Signed: 02/09/2022 8:39:36 AM By: Yevonne Pax RN ?Entered By: Yevonne Pax on 02/09/2022 08:39:36 ?AJIAH, MCGLINN ([id number]) ?-------------------------------------------------------------------------------- ?Encounter Discharge Information Details ?Patient Name: [name], KEINATH ?Date of Service: 02/09/2022 8:00 AM ?Medical Record Number: [id number] ?Patient Account Number: [id number] ?Date of Birth/Sex: 1986/06/18 (36 y.o. F) ?Treating RN: Yevonne Pax ?Primary Care Avonelle Viveros: Lynnell Chad Other Clinician: ?Referring Korra Christine: Lynnell Chad ?Treating Osceola Holian/Extender: Allen Derry ?Weeks in Treatment: 1 ?Encounter  Discharge Information Items ?Discharge Condition: Stable ?Ambulatory Status: Wheelchair ?Discharge Destination: Home ?Transportation: Private Auto ?Accompanied By: self ?Schedule Follow-up Appointment: Yes ?Clinical Summary of Care: Patient Declined ?Electronic Signature(s) ?Signed: 02/09/2022 8:42:54 AM By: Yevonne Pax RN ?Entered By: Yevonne Pax on 02/09/2022 08:42:53 ?KENDRE, SIRES ([id number]) ?-------------------------------------------------------------------------------- ?Wound Assessment Details ?Patient Name: [name] ?Date of Service: 02/09/2022 8:00 AM ?Medical Record Number: [id number] ?Patient Account Number: [id number] ?Date of Birth/Sex: April 29, 1986 (36 y.o. F) ?Treating RN: Yevonne Pax ?Primary Care Kemyah Buser: Lynnell Chad Other Clinician: ?Referring Kathrynn Backstrom: Lynnell Chad ?Treating Vernon Ariel/Extender: Allen Derry ?Weeks in Treatment: 1 ?Wound Status ?Wound Number: 1 ?Primary Etiology: Abscess ?Wound Location: Right Amputation Site - Below Knee ?Wound Status: Open ?Wounding Event: Gradually Appeared ?Comorbid History: Hypertension, Type II Diabetes ?Date Acquired: 01/14/2022 ?Weeks Of Treatment: 1 ?Clustered Wound: No ?Wound Measurements ?Length: (cm) 5 ?Width: (cm) 8 ?Depth: (cm) 2.9 ?Area: (cm?) 31.416 ?Volume: (cm?) 91.106 ?% Reduction in Area: -16.3% ?% Reduction in Volume: 13.5% ?Epithelialization: None ?Wound Description ?Classification: Full Thickness With Exposed Support Structures ?Exudate Amount: Medium ?Exudate Type: Serosanguineous ?Exudate Color: red, brown ?Foul Odor After Cleansing: No ?Slough/Fibrino Yes ?Wound Bed ?Granulation Amount: Medium (34-66%) Exposed Structure ?Granulation Quality: Red, Pink ?Fascia Exposed: No ?Necrotic Amount: Medium (34-66%) ?Fat Layer (Subcutaneous Tissue) Exposed: Yes ?Necrotic Quality: Adherent Slough ?Tendon Exposed: No ?Muscle Exposed: Yes ?Necrosis of Muscle: No ?Joint Exposed: No ?Bone Exposed: No ?Treatment Notes ?Wound #1 (Amputation Site -  Below Knee) Wound Laterality: Right ?Cleanser ?Peri-Wound Care ?Topical ?Primary Dressing ?Secondary Dressing ?Secured With ?Compression Wrap ?Compression Stockings ?Add-Ons ?Electronic Signature(s) ?Signed: 02/09/2022 8:41:33 AM By: Yevonne Pax RN ?Entered By: Yevonne Pax on 02/09/2022 08:41:33 ?

## 2022-02-10 ENCOUNTER — Encounter: Payer: Self-pay | Admitting: Nurse Practitioner

## 2022-02-10 ENCOUNTER — Telehealth: Payer: Self-pay | Admitting: Nurse Practitioner

## 2022-02-10 ENCOUNTER — Ambulatory Visit: Payer: Medicaid Other | Admitting: Nurse Practitioner

## 2022-02-10 VITALS — BP 122/82 | HR 113 | Temp 96.7°F | Resp 20 | Ht 67.0 in | Wt 192.0 lb

## 2022-02-10 DIAGNOSIS — R110 Nausea: Secondary | ICD-10-CM | POA: Diagnosis not present

## 2022-02-10 DIAGNOSIS — E781 Pure hyperglyceridemia: Secondary | ICD-10-CM

## 2022-02-10 MED ORDER — OMEGA-3 FATTY ACIDS 1000 MG PO CAPS: 2.0000 g | ORAL_CAPSULE | Freq: Every day | ORAL | 1 refills | Status: DC

## 2022-02-10 MED ORDER — OMEGA-3-ACID ETHYL ESTERS 1 G PO CAPS: 2.0000 g | ORAL_CAPSULE | Freq: Two times a day (BID) | ORAL | 3 refills | Status: DC

## 2022-02-10 MED ORDER — ONDANSETRON HCL 4 MG PO TABS: 4.0000 mg | ORAL_TABLET | Freq: Three times a day (TID) | ORAL | 0 refills | Status: DC | PRN

## 2022-02-10 NOTE — Progress Notes (Signed)
? ?Established Patient Office Visit ? ?Subjective   ?Patient ID: [name], female    DOB: 03-May-1986  Age: 36 y.o. MRN: [id number] ? ?Chief Complaint  ?Patient presents with  ? Medical Management of Chronic Issues  ?  4 week follow up   ? ? ?HPI ?Patient is following up 4 weeks after changing Ozempic dose. Patient is reporting worsing GI symptoms with nausea and vomiting. Patient will prefer to stay on current dose 1 mg for the next 4 weeks and follow up to discuse going up in [place] dose and then eventually on maintenance doses. ? ?Patient Active Problem List  ? Diagnosis Date Noted  ? Chronic pain of right knee 01/13/2022  ? Nausea 01/13/2022  ? Morbid obesity ([place]) 11/30/2021  ? Type 2 diabetes mellitus with hyperglycemia ([place]) 11/30/2021  ? Non-healing amputation site [facility]) 10/12/2021  ? Necrotizing fasciitis of ankle and foot ([place]) 10/05/2021  ? Hypokalemia 10/02/2021  ? Hypoalbuminemia 09/26/2021  ? Positive for macroalbuminuria 09/26/2021  ? Acute blood loss anemia 09/22/2021  ? ATN (acute tubular necrosis) ([place]) 09/22/2021  ? AKI (acute kidney injury) ([place]) 09/20/2021  ? Hyponatremia 09/19/2021  ? Diabetes mellitus type 2, noninsulin dependent ([place]) 09/19/2021  ? Chronic hypertension 12/16/2020  ? Missed abortion 12/16/2020  ? Status post cesarean delivery 12/15/2020  ? Establishing care with new doctor, encounter for 12/22/2018  ? Pap smear abnormality of cervix/human papillomavirus (HPV) positive 06/25/2018  ? BMI 33.0-33.9,adult 03/30/2016  ? ?Past Medical History:  ?Diagnosis Date  ? Chronic hypertension   ? 12-16-2020  documented in epic since 2017 pt dx hypertension (not during pregnancy);  pt currently is not and has not been taking any bp med,  bp at [facility] 12-15-2020 165/ 104  ? Missed ab 12/15/2020  ? Type 2 diabetes mellitus ([place])   ? 12-16-2020  pt stated only during pregnancy took medication, at [facility] 12-15-2020 blood glucose 353 pt was given metformin bid and pt stated did start taking it  ?  Wears glasses   ? ?Past Surgical History:  ?Procedure Laterality Date  ? BELOW KNEE LEG AMPUTATION Right   ? CESAREAN SECTION N/A 12/25/2018  ? Procedure: CESAREAN SECTION;  Surgeon: [name], DO;  Location: [facility];  Service: Obstetrics;  Laterality: N/A;  ? TONSILLECTOMY AND ADENOIDECTOMY  age 45  ? ?Social History  ? ?Tobacco Use  ? Smoking status: Never  ? Smokeless tobacco: Never  ?Vaping Use  ? Vaping Use: Never used  ?Substance Use Topics  ? Alcohol use: No  ? Drug use: Never  ? ?Social History  ? ?Socioeconomic History  ? Marital status: Single  ?  Spouse name: Not on file  ? Number of children: Not on file  ? Years of education: Not on file  ? Highest education level: Not on file  ?Occupational History  ? Not on file  ?Tobacco Use  ? Smoking status: Never  ? Smokeless tobacco: Never  ?Vaping Use  ? Vaping Use: Never used  ?Substance and Sexual Activity  ? Alcohol use: No  ? Drug use: Never  ? Sexual activity: Yes  ?  Birth control/protection: None  ?Other Topics Concern  ? Not on file  ?Social History Narrative  ? Not on file  ? ?Social Determinants of Health  ? ?Financial Resource Strain: Not on file  ?Food Insecurity: Not on file  ?Transportation Needs: Not on file  ?Physical Activity: Not on file  ?Stress: Not on file  ?Social Connections:  Not on file  ?Intimate Partner Violence: Not on file  ? ?Family Status  ?Relation Name Status  ? Mother  Alive  ? MGM  (Not Specified)  ? Mat Aunt  (Not Specified)  ? Mat Uncle  (Not Specified)  ? Father  Deceased  ? ?Family History  ?Problem Relation Age of Onset  ? Diabetes Mother   ? Hypertension Mother   ? Hypertension Maternal Grandmother   ? Diabetes Maternal Aunt   ? Diabetes Maternal Uncle   ? ?Allergies  ?Allergen Reactions  ? Almond Oil Hives  ? Almond (Diagnostic) Hives  ? Bee Venom Hives  ? Limonene Hives  ? ?  ? ?Review of Systems  ?Constitutional: Negative.   ?HENT: Negative.    ?Eyes: Negative.   ?Respiratory: Negative.    ?Cardiovascular:  Negative.   ?Gastrointestinal:  Positive for nausea.  ?Genitourinary: Negative.   ?Musculoskeletal: Negative.   ?Skin: Negative.   ?All other systems reviewed and are negative. ? ?  ?Objective:  ?  ? ?BP 122/82   Pulse (!) 113   Temp (!) 96.7 ?F (35.9 ?C)   Resp 20   Ht _0  (1.702 m)   Wt 192 lb (87.1 kg)   LMP 01/16/2022 (Exact Date)   SpO2 100%   Breastfeeding No   BMI 30.07 kg/m?  ?BP Readings from Last 3 Encounters:  ?02/10/22 122/82  ?01/13/22 101/68  ?11/30/21 125/86  ? ?Wt Readings from Last 3 Encounters:  ?02/10/22 192 lb (87.1 kg)  ?01/13/22 236 lb (107 kg)  ?11/30/21 236 lb 15.9 oz (107.5 kg)  ? ?  ? ?Physical Exam ?Vitals and nursing note reviewed.  ?Constitutional:   ?   Appearance: She is obese.  ?HENT:  ?   Head: Normocephalic.  ?   Right Ear: External ear normal.  ?   Left Ear: External ear normal.  ?   Nose: Nose normal.  ?   Mouth/Throat:  ?   Mouth: Mucous membranes are moist.  ?Eyes:  ?   Conjunctiva/sclera: Conjunctivae normal.  ?Cardiovascular:  ?   Rate and Rhythm: Normal rate and regular rhythm.  ?   Pulses: Normal pulses.  ?   Heart sounds: Normal heart sounds.  ?Pulmonary:  ?   Effort: Pulmonary effort is normal.  ?   Breath sounds: Normal breath sounds.  ?Abdominal:  ?   General: Bowel sounds are normal.  ?Musculoskeletal:     ?   General: Normal range of motion.  ?Neurological:  ?   General: No focal deficit present.  ?   Mental Status: She is alert and oriented to person, place, and time.  ?Psychiatric:     ?   Behavior: Behavior normal.  ? ? ? ?No results found for any visits on 02/10/22. ? ?Last CBC ?Lab Results  ?Component Value Date  ? WBC 8.6 01/09/2022  ? HGB 11.9 01/09/2022  ? HCT 36.0 01/09/2022  ? MCV 87 01/09/2022  ? MCH 28.6 01/09/2022  ? RDW 14.5 01/09/2022  ? PLT 380 01/09/2022  ? ?Last metabolic panel ?Lab Results  ?Component Value Date  ? GLUCOSE 80 01/09/2022  ? NA 138 01/09/2022  ? K 4.4 01/09/2022  ? CL 101 01/09/2022  ? CO2 26 01/09/2022  ? BUN 14 01/09/2022   ? CREATININE 0.72 01/09/2022  ? EGFR 112 01/09/2022  ? CALCIUM 9.8 01/09/2022  ? PROT 7.5 01/09/2022  ? ALBUMIN 3.6 (L) 01/09/2022  ? LABGLOB 3.9 01/09/2022  ? AGRATIO 0.9 (L) 01/09/2022  ?  BILITOT 0.3 01/09/2022  ? ALKPHOS 64 01/09/2022  ? AST 14 01/09/2022  ? ALT 11 01/09/2022  ? ANIONGAP 7 12/15/2020  ? ?Last lipids ?Lab Results  ?Component Value Date  ? CHOL 191 01/09/2022  ? HDL 35 (L) 01/09/2022  ? LDLCALC 111 (H) 01/09/2022  ? TRIG 260 (H) 01/09/2022  ? CHOLHDL 5.5 (H) 01/09/2022  ? ?Last hemoglobin A1c ?Lab Results  ?Component Value Date  ? HGBA1C 6.4 (H) 01/09/2022  ? ?Last thyroid functions ?Lab Results  ?Component Value Date  ? TSH 1.450 06/20/2018  ? ?Last vitamin D ?No results found for: 25OHVITD2, 25OHVITD3, VD25OH ?  ? ?The ASCVD Risk score (Arnett DK, et al., 2019) failed to calculate for the following reasons: ?  The 2019 ASCVD risk score is only valid for ages 63 to 34 ? ?  ?Assessment & Plan:  ? ?Problem List Items Addressed This Visit   ? ?  ? Other  ? Morbid obesity ([place])  ?  Patient is consistently loosing weight, and will continue , life style changes ] ? ?  ?  ? Nausea  ? Relevant Medications  ? ondansetron (ZOFRAN) 4 MG tablet  ? ?Other Visit Diagnoses   ? ? High triglycerides    -  Primary  ? Relevant Medications  ? omega-3 acid ethyl esters (LOVAZA) 1 g capsule  ? ?  ? ? ?Return in about 2 months (around 04/12/2022) for chronic disease management.  ? ? ?Ivy Lynn, NP ? ?

## 2022-02-10 NOTE — Patient Instructions (Signed)
Diabetes Mellitus Basics  Diabetes mellitus, or diabetes, is a long-term (chronic) disease. It occurs when the body does not properly use sugar (glucose) that is released from food after you eat. Diabetes mellitus may be caused by one or both of these problems: Your pancreas does not make enough of a hormone called insulin. Your body does not react in a normal way to the insulin that it makes. Insulin lets glucose enter cells in your body. This gives you energy. If you have diabetes, glucose cannot get into cells. This causes high blood glucose (hyperglycemia). How to treat and manage diabetes You may need to take insulin or other diabetes medicines daily to keep your glucose in balance. If you are prescribed insulin, you will learn how to give yourself insulin by injection. You may need to adjust the amount of insulin you take based on the foods that you eat. You will need to check your blood glucose levels using a glucose monitor as told by your health care provider. The readings can help determine if you have low or high blood glucose. Generally, you should have these blood glucose levels: Before meals (preprandial): 80-130 mg/dL (4.4-7.2 mmol/L). After meals (postprandial): below 180 mg/dL (10 mmol/L). Hemoglobin A1c (HbA1c) level: less than 7%. Your health care provider will set treatment goals for you. Keep all follow-up visits. This is important. Follow these instructions at home: Diabetes medicines Take your diabetes medicines every day as told by your health care provider. List your diabetes medicines here: Name of medicine: ______________________________ Amount (dose): _______________ Time (a.m./p.m.): _______________ Notes: ___________________________________ Name of medicine: ______________________________ Amount (dose): _______________ Time (a.m./p.m.): _______________ Notes: ___________________________________ Name of medicine: ______________________________ Amount (dose):  _______________ Time (a.m./p.m.): _______________ Notes: ___________________________________ Insulin If you use insulin, list the types of insulin you use here: Insulin type: ______________________________ Amount (dose): _______________ Time (a.m./p.m.): _______________Notes: ___________________________________ Insulin type: ______________________________ Amount (dose): _______________ Time (a.m./p.m.): _______________ Notes: ___________________________________ Insulin type: ______________________________ Amount (dose): _______________ Time (a.m./p.m.): _______________ Notes: ___________________________________ Insulin type: ______________________________ Amount (dose): _______________ Time (a.m./p.m.): _______________ Notes: ___________________________________ Insulin type: ______________________________ Amount (dose): _______________ Time (a.m./p.m.): _______________ Notes: ___________________________________ Managing blood glucose  Check your blood glucose levels using a glucose monitor as told by your health care provider. Write down the times that you check your glucose levels here: Time: _______________ Notes: ___________________________________ Time: _______________ Notes: ___________________________________ Time: _______________ Notes: ___________________________________ Time: _______________ Notes: ___________________________________ Time: _______________ Notes: ___________________________________ Time: _______________ Notes: ___________________________________  Low blood glucose Low blood glucose (hypoglycemia) is when glucose is at or below 70 mg/dL (3.9 mmol/L). Symptoms may include: Feeling: Hungry. Sweaty and clammy. Irritable or easily upset. Dizzy. Sleepy. Having: A fast heartbeat. A headache. A change in your vision. Numbness around the mouth, lips, or tongue. Having trouble with: Moving (coordination). Sleeping. Treating low blood glucose To treat low blood  glucose, eat or drink something containing sugar right away. If you can think clearly and swallow safely, follow the 15:15 rule: Take 15 grams of a fast-acting carb (carbohydrate), as told by your health care provider. Some fast-acting carbs are: Glucose tablets: take 3-4 tablets. Hard candy: eat 3-5 pieces. Fruit juice: drink 4 oz (120 mL). Regular (not diet) soda: drink 4-6 oz (120-180 mL). Honey or sugar: eat 1 Tbsp (15 mL). Check your blood glucose levels 15 minutes after you take the carb. If your glucose is still at or below 70 mg/dL (3.9 mmol/L), take 15 grams of a carb again. If your glucose does not go above 70 mg/dL (3.9 mmol/L) after   3 tries, get help right away. After your glucose goes back to normal, eat a meal or a snack within 1 hour. Treating very low blood glucose If your glucose is at or below 54 mg/dL (3 mmol/L), you have very low blood glucose (severe hypoglycemia). This is an emergency. Do not wait to see if the symptoms will go away. Get medical help right away. Call your local emergency services (911 in the [place] Do not drive yourself to the hospital. Questions to ask your health care provider Should I talk with a diabetes educator? What equipment will I need to care for myself at home? What diabetes medicines do I need? When should I take them? How often do I need to check my blood glucose levels? What number can I call if I have questions? When is my follow-up visit? Where can I find a support group for people with diabetes? Where to find more information American Diabetes Association: www.diabetes.org Association of Diabetes Care and Education Specialists: www.diabeteseducator.org Contact a health care provider if: Your blood glucose is at or above 240 mg/dL (13.3 mmol/L) for 2 days in a row. You have been sick or have had a fever for 2 days or more, and you are not getting better. You have any of these problems for more than 6 hours: You cannot eat or  drink. You feel nauseous. You vomit. You have diarrhea. Get help right away if: Your blood glucose is lower than 54 mg/dL (3 mmol/L). You get confused. You have trouble thinking clearly. You have trouble breathing. These symptoms may represent a serious problem that is an emergency. Do not wait to see if the symptoms will go away. Get medical help right away. Call your local emergency services (911 in the [place] Do not drive yourself to the hospital. Summary Diabetes mellitus is a chronic disease that occurs when the body does not properly use sugar (glucose) that is released from food after you eat. Take insulin and diabetes medicines as told. Check your blood glucose every day, as often as told. Keep all follow-up visits. This is important. This information is not intended to replace advice given to you by your health care provider. Make sure you discuss any questions you have with your health care provider. Document Revised: 01/20/2020 Document Reviewed: 01/20/2020 Elsevier Patient Education  2023 [employer]  

## 2022-02-10 NOTE — Telephone Encounter (Signed)
PA for lovaza came in. Patient must try and fail  ?fenofibrate tablet (generic for Tricor? ?gemfibrozil tablet (generic for Lopid? ?before they will consider lovaza ?

## 2022-02-10 NOTE — Telephone Encounter (Signed)
No worries, I'll send a different medication. thank you ?

## 2022-02-10 NOTE — Addendum Note (Signed)
Addended by: Ivy Lynn on: 02/10/2022 03:06 PM ? ? Modules accepted: Orders ? ?

## 2022-02-10 NOTE — Assessment & Plan Note (Signed)
Patient is consistently loosing weight, and will continue , life style changes ] ?

## 2022-02-13 ENCOUNTER — Encounter: Payer: Medicaid Other | Admitting: Physician Assistant

## 2022-02-13 DIAGNOSIS — E11622 Type 2 diabetes mellitus with other skin ulcer: Secondary | ICD-10-CM | POA: Diagnosis not present

## 2022-02-13 IMAGING — US US OB COMP LESS 14 WK
1 series · 15 of 28 positions shown · non-contrast
Comparison: None.

CLINICAL DATA: Vaginal bleeding.

EXAM:
OBSTETRIC <14 WK ULTRASOUND
TECHNIQUE: Transabdominal ultrasound was performed for evaluation of the
gestation as well as the maternal uterus and adnexal regions.

[Series 1: us ob comp less 14 wk · 60 acquisitions, 15 frames shown]
[im 1/60]
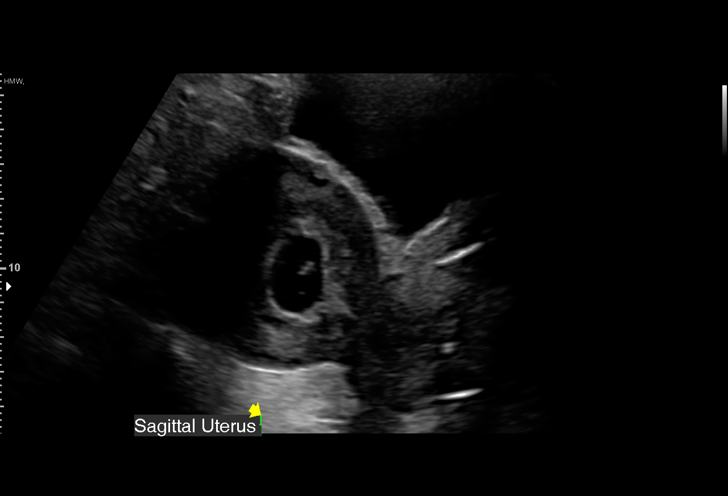
[im 5/60]
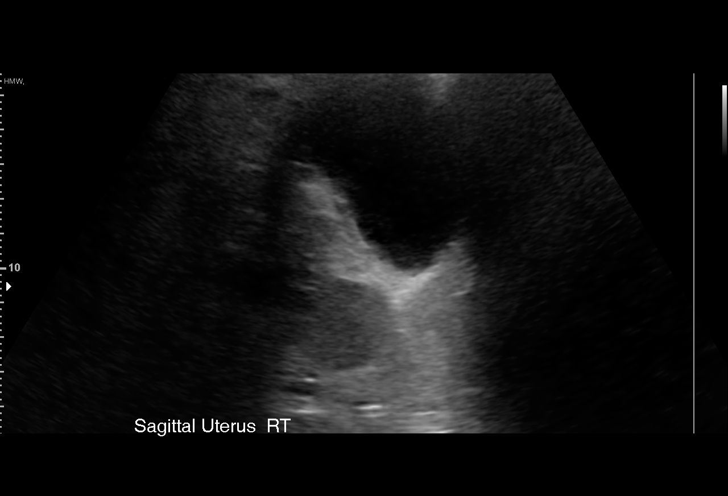
[im 9/60]
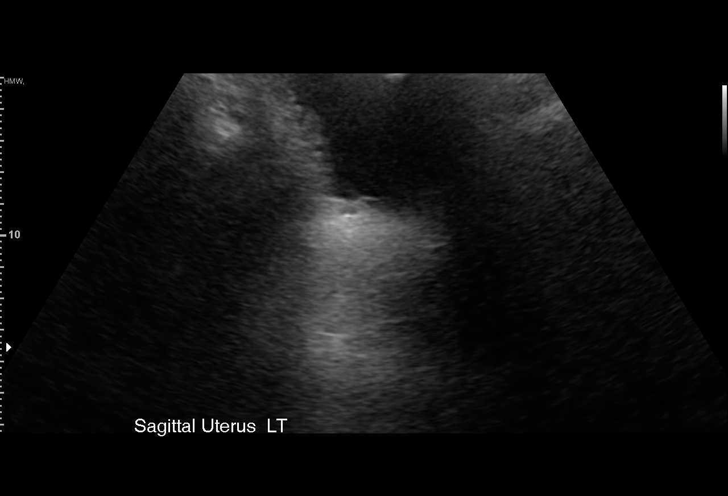
[im 14/60]
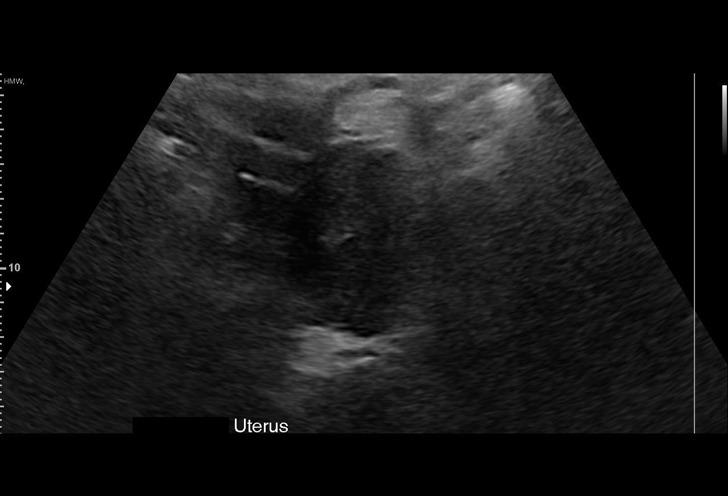
[im 18/60]
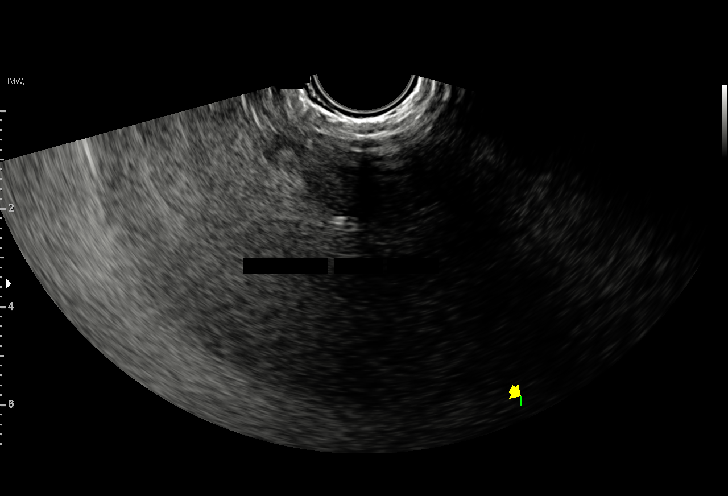
[im 22/60]
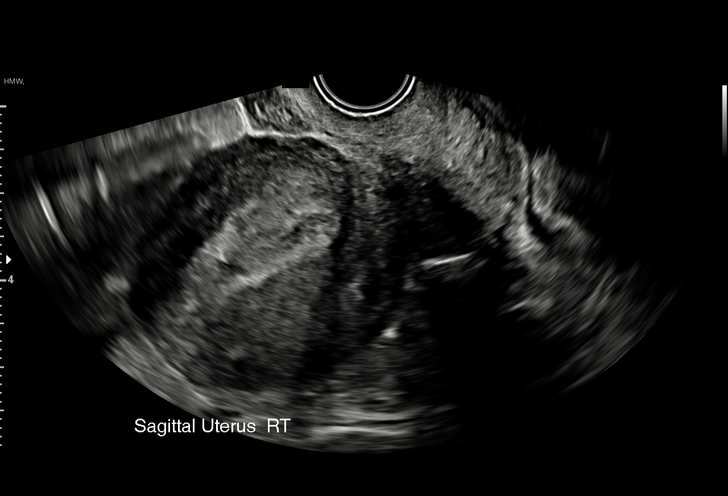
[im 27/60]
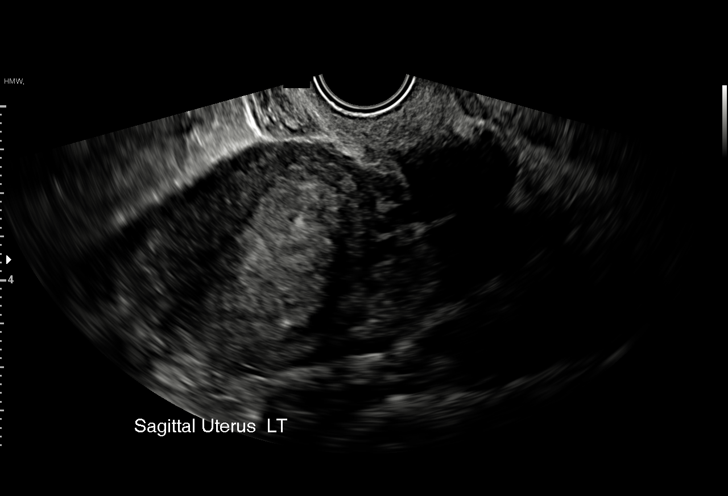
[im 31/60]
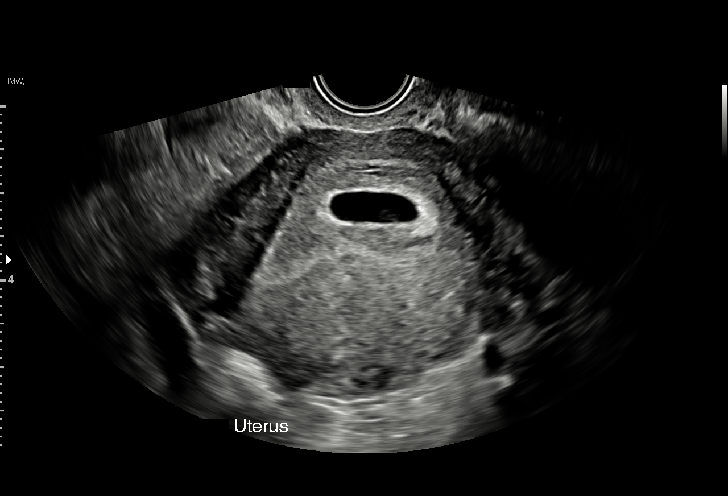
[im 33/60]
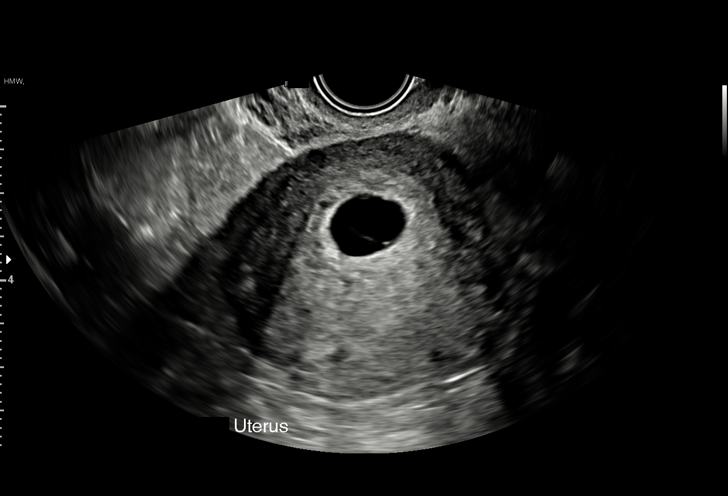
[im 38/60]
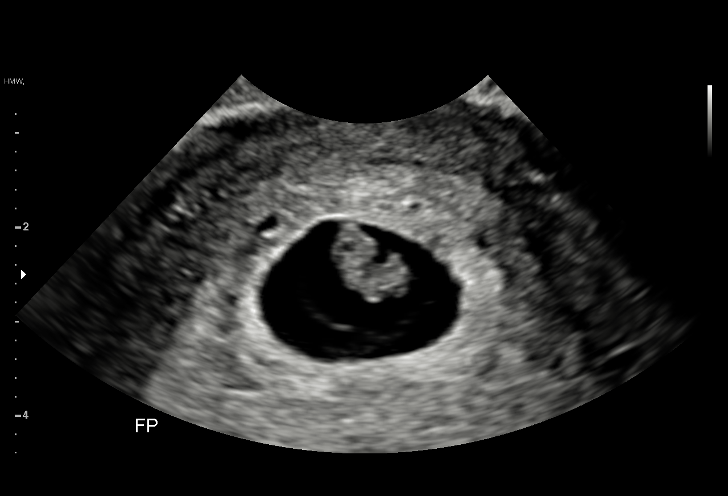
[im 42/60]
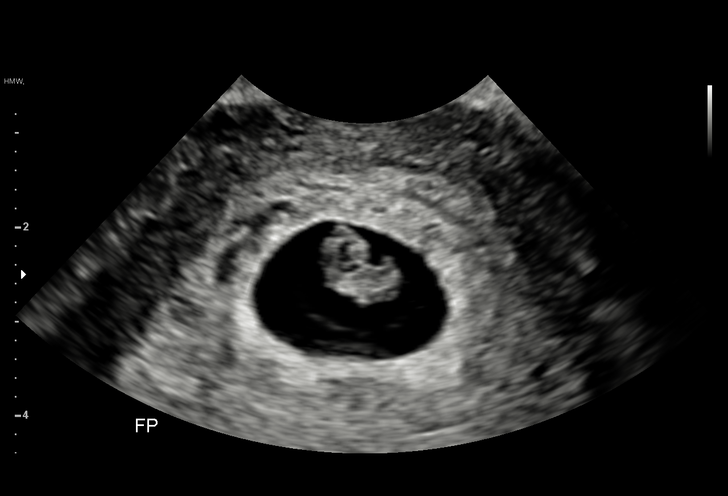
[im 46/60]
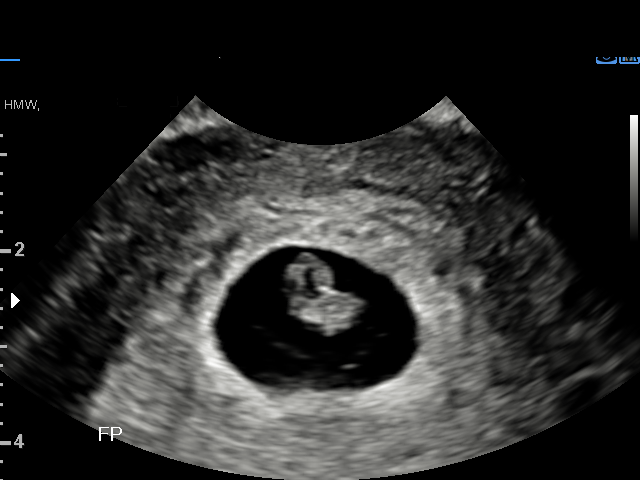
[im 51/60]
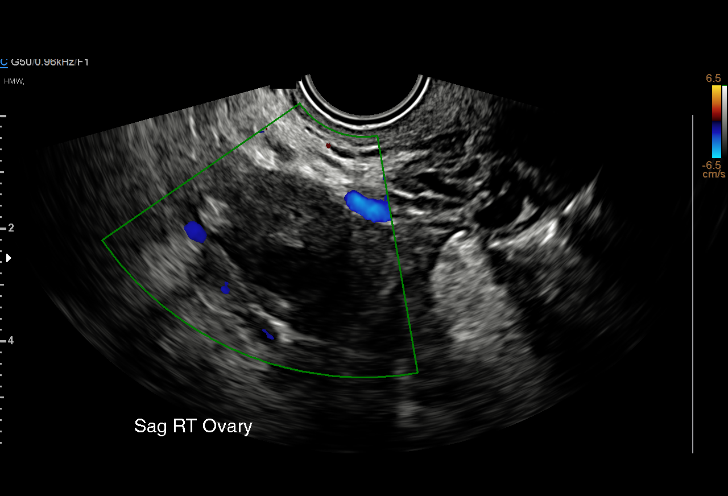
[im 55/60]
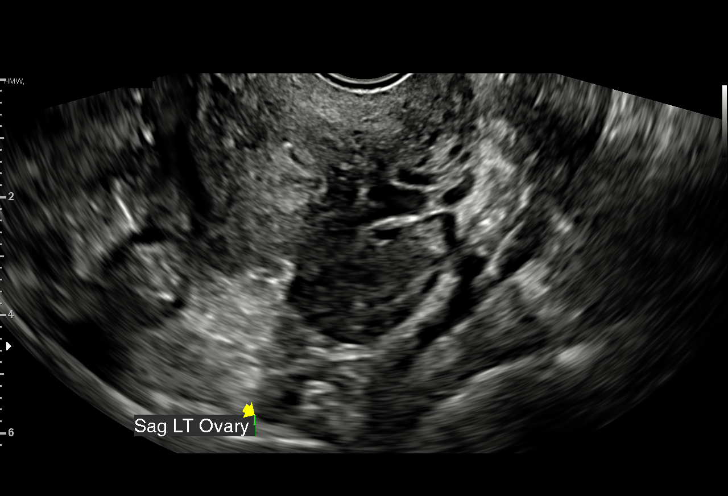
[im 60/60]
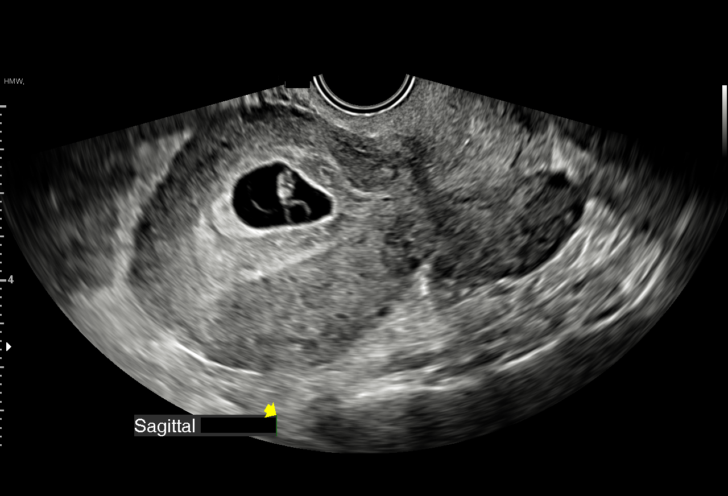

[15 of 28 positions shown; findings below may reference images not displayed]

FINDINGS: Intrauterine gestational sac: Single

Yolk sac:  Visualized.

Embryo:  Visualized.

Cardiac Activity: Not Visualized.

Heart Rate: N/A bpm

CRL:   9.7 mm   7 w 0 d                  US EDC: August 03, 2021

Subchorionic hemorrhage:  None visualized.

Maternal uterus/adnexae: The bilateral ovaries are visualized and
are normal in appearance.
IMPRESSION: Single, nonviable intrauterine pregnancy at approximately 7 weeks
and 0 days gestation by ultrasound evaluation.

## 2022-02-13 NOTE — Progress Notes (Addendum)
CHRISHONDA, LEFTHAND ([id number]) ?Visit Report for 02/13/2022 ?Chief Complaint Document Details ?Patient Name: [name], [name] ?Date of Service: 02/13/2022 8:00 AM ?Medical Record Number: [id number] ?Patient Account Number: [id number] ?Date of Birth/Sex: [birth year] (35 y.o. F) ?Treating RN: Carlene Coria ?Primary Care Provider: Jac Canavan Other Clinician: ?Referring Provider: Jac Canavan ?Treating Provider/Extender: Jeri Cos ?Weeks in Treatment: 2 ?Information Obtained from: Patient ?Chief Complaint ?Right BKA Amputation Site Disruption ?Electronic Signature(s) ?Signed: 02/13/2022 8:19:43 AM By: Worthy Keeler PA-C ?Entered By: Worthy Keeler on 02/13/2022 08:19:43 ?[name], [name] ([id number]) ?-------------------------------------------------------------------------------- ?HPI Details ?Patient Name: [name], [name] ?Date of Service: 02/13/2022 8:00 AM ?Medical Record Number: [id number] ?Patient Account Number: [id number] ?Date of Birth/Sex: 02-Nov-1985 (35 y.o. F) ?Treating RN: Carlene Coria ?Primary Care Provider: Jac Canavan Other Clinician: ?Referring Provider: Jac Canavan ?Treating Provider/Extender: Jeri Cos ?Weeks in Treatment: 2 ?History of Present Illness ?HPI Description: 01-27-2022 upon evaluation today patient appears to be doing somewhat poorly in regard to her right below-knee amputation. ?This unfortunately developed a abscess internally following the initial amputation which was January 2023. Subsequently she was getting very ?close to starting to work on the prosthesis when she had an area that somewhat bulged out ended up being an abscess and the incision and ?drainage was performed April 22. Subsequently she did have antibiotic beads placed in a wound VAC after this was cleared out. Her most recent ?reported hemoglobin A1c was 6.4. With that being said the patient tells me that her blood sugars seem to be under pretty good control as best ?she can tell at this point. She also tells me that the  wound VAC seems to be functioning appropriately although upon evaluation the did not appear ?to be anything tucked into the region of undermining and depth in the roughly 9-12 o'clock location based on what we saw today. This is also ?where the antibiotic beads are and I can see those in place. I do believe that she is getting need to have the wound VAC tucked down into this ?location. ?Patient does have a history of diabetes mellitus type 2, diabetic neuropathy, and hypertension. This initial amputation occurred as a result of her ?having sewing needles that actually went into her heel she did not know about it started having trouble with her heel and ended up with a ?significant infection which was stated to be necrotizing and she subsequently had to have emergency amputation. ?02-13-2022 upon evaluation today patient appears to be doing well with regard to her wound. She has been tolerating the dressing changes ?without complication. The wound VAC does seem to be doing a great job. Fortunately I do not see any evidence of active infection locally or ?systemically at this time which is great news. She still has some of the antibiotic beads in the base of the wound. ?Electronic Signature(s) ?Signed: 02/13/2022 8:44:54 AM By: Worthy Keeler PA-C ?Entered By: Worthy Keeler on 02/13/2022 08:44:54 ?[name], [name] ([id number]) ?-------------------------------------------------------------------------------- ?Physical Exam Details ?Patient Name: [name], [name] ?Date of Service: 02/13/2022 8:00 AM ?Medical Record Number: [id number] ?Patient Account Number: [id number] ?Date of Birth/Sex: [birth year] (35 y.o. F) ?Treating RN: Carlene Coria ?Primary Care Provider: Jac Canavan Other Clinician: ?Referring Provider: Jac Canavan ?Treating Provider/Extender: Jeri Cos ?Weeks in Treatment: 2 ?Constitutional ?Well-nourished and well-hydrated in no acute distress. ?Respiratory ?normal breathing without  difficulty. ?Psychiatric ?this patient is able to make decisions and demonstrates good insight into disease process. Alert and Oriented x 3. pleasant and cooperative. ?Notes ?Upon inspection patient's wound bed actually showed signs of  good healing I do feel like the wound VAC is doing a good job here. With that being ?said the patient does have antibiotic beads still noted in the base of the wound although these are getting much smaller. Overall I do believe that ?we are on the right track here and I discussed that with the patient as well. I do believe that adding the Levaquin which I did based on the color of ?the drainage is also benefit for her. She started that on Thursday hopefully the drainage color will start improving to some degree. ?Electronic Signature(s) ?Signed: 02/13/2022 8:45:26 AM By: Worthy Keeler PA-C ?Entered By: Worthy Keeler on 02/13/2022 08:45:26 ?[name], [name] ([id number]) ?-------------------------------------------------------------------------------- ?Physician Orders Details ?Patient Name: [name], [name] ?Date of Service: 02/13/2022 8:00 AM ?Medical Record Number: [id number] ?Patient Account Number: [id number] ?Date of Birth/Sex: 10-Apr-1986 (35 y.o. F) ?Treating RN: Carlene Coria ?Primary Care Provider: Jac Canavan Other Clinician: ?Referring Provider: Jac Canavan ?Treating Provider/Extender: Jeri Cos ?Weeks in Treatment: 2 ?Verbal / Phone Orders: No ?Diagnosis Coding ?ICD-10 Coding ?Code Description ?E11.622 Type 2 diabetes mellitus with other skin ulcer ?T81.31XA Disruption of external operation (surgical) wound, not elsewhere classified, initial encounter ?[id number] Non-pressure chronic ulcer of other part of right lower leg with fat layer exposed ?E11.43 Type 2 diabetes mellitus with diabetic autonomic (poly)neuropathy ?I10 Essential (primary) hypertension ?Follow-up Appointments ?o Return Appointment in 1 week. ?o Nurse Visit as needed - thursday ?Bathing/ Shower/  Hygiene ?o No tub bath. ?Negative Pressure Wound Therapy ?o Wound VAC settings at 15mmHg continuous pressure. Use foam to wound cavity. Please order WHITE foam to fill any ?tunnel/s and/or undermining when necessary. Change VAC dressing 2 X WEEK. Change canister as indicated when full. ?o Number of foam/gauze pieces used in the dressing = - 2 ?o Other: - wet to dry if wound vac malfunctions ?Wound Treatment ?Electronic Signature(s) ?Signed: 02/13/2022 5:23:27 PM By: Worthy Keeler PA-C ?Signed: 02/14/2022 10:35:39 AM By: Carlene Coria RN ?Entered By: Carlene Coria on 02/13/2022 08:31:06 ?[name], [name] ([id number]) ?-------------------------------------------------------------------------------- ?Problem List Details ?Patient Name: [name], [name] ?Date of Service: 02/13/2022 8:00 AM ?Medical Record Number: [id number] ?Patient Account Number: [id number] ?Date of Birth/Sex: Dec 20, 1985 (35 y.o. F) ?Treating RN: Carlene Coria ?Primary Care Provider: Jac Canavan Other Clinician: ?Referring Provider: Jac Canavan ?Treating Provider/Extender: Jeri Cos ?Weeks in Treatment: 2 ?Active Problems ?ICD-10 ?Encounter ?Code Description Active Date MDM ?Diagnosis ?E11.622 Type 2 diabetes mellitus with other skin ulcer 01/27/2022 No Yes ?T81.31XA Disruption of external operation (surgical) wound, not elsewhere 01/27/2022 No Yes ?classified, initial encounter ?[id number] Non-pressure chronic ulcer of other part of right lower leg with fat layer 01/27/2022 No Yes ?exposed ?E11.43 Type 2 diabetes mellitus with diabetic autonomic (poly)neuropathy 01/27/2022 No Yes ?I10 Essential (primary) hypertension 01/27/2022 No Yes ?Inactive Problems ?Resolved Problems ?Electronic Signature(s) ?Signed: 02/13/2022 8:19:37 AM By: Worthy Keeler PA-C ?Entered By: Worthy Keeler on 02/13/2022 08:19:37 ?[name], [name] ([id number]) ?-------------------------------------------------------------------------------- ?Progress Note Details ?Patient Name:  [name], [name] ?Date of Service: 02/13/2022 8:00 AM ?Medical Record Number: [id number] ?Patient Account Number: [id number] ?Date of Birth/Sex: 12-08-1985 (35 y.o. F) ?Treating RN: Carlene Coria ?Primary Care Provider: Jac Canavan Other Clinici

## 2022-02-14 NOTE — Progress Notes (Signed)
SCARLETTROSE, COSTILOW ([id number]) ?Visit Report for 02/13/2022 ?Arrival Information Details ?Patient Name: [name], [name] ?Date of Service: 02/13/2022 8:00 AM ?Medical Record Number: [id number] ?Patient Account Number: [id number] ?Date of Birth/Sex: [birth year] (35 y.o. F) ?Treating RN: Yevonne Pax ?Primary Care Kato Wieczorek: Lynnell Chad Other Clinician: ?Referring Kelsea Mousel: Lynnell Chad ?Treating Lilli Dewald/Extender: Allen Derry ?Weeks in Treatment: 2 ?Visit Information History Since Last Visit ?All ordered tests and consults were completed: No ?Patient Arrived: Wheel Chair ?Added or deleted any medications: No ?Arrival Time: 08:01 ?Any new allergies or adverse reactions: No ?Accompanied By: self ?Had a fall or experienced change in No ?Transfer Assistance: None ?activities of daily living that may affect ?Patient Identification Verified: Yes ?risk of falls: ?Secondary Verification Process Completed: Yes ?Signs or symptoms of abuse/neglect since last visito No ?Patient Requires Transmission-Based Precautions: No ?Hospitalized since last visit: No ?Patient Has Alerts: Yes ?Implantable device outside of the clinic excluding No ?Patient Alerts: DIABETIC ?cellular tissue based products placed in the center ?since last visit: ?Has Dressing in Place as Prescribed: Yes ?Pain Present Now: No ?Electronic Signature(s) ?Signed: 02/14/2022 10:35:39 AM By: Yevonne Pax RN ?Entered By: Yevonne Pax on 02/13/2022 08:14:48 ?LYNIA, LANDRY ([id number]) ?-------------------------------------------------------------------------------- ?Clinic Level of Care Assessment Details ?Patient Name: [name], [name] ?Date of Service: 02/13/2022 8:00 AM ?Medical Record Number: [id number] ?Patient Account Number: [id number] ?Date of Birth/Sex: 06-14-1986 (35 y.o. F) ?Treating RN: Yevonne Pax ?Primary Care Cameryn Schum: Lynnell Chad Other Clinician: ?Referring Norva Bowe: Lynnell Chad ?Treating Kemp Gomes/Extender: Allen Derry ?Weeks in Treatment: 2 ?Clinic Level of  Care Assessment Items ?TOOL 1 Quantity Score ?[]  - Use when EandM and Procedure is performed on INITIAL visit 0 ?ASSESSMENTS - Nursing Assessment / Reassessment ?[]  - General Physical Exam (combine w/ comprehensive assessment (listed just below) when performed on new ?0 ?pt. evals) ?[]  - 0 ?Comprehensive Assessment (HX, ROS, Risk Assessments, Wounds Hx, etc.) ?ASSESSMENTS - Wound and Skin Assessment / Reassessment ?[]  - Dermatologic / Skin Assessment (not related to wound area) 0 ?ASSESSMENTS - Ostomy and/or Continence Assessment and Care ?[]  - Incontinence Assessment and Management 0 ?[]  - 0 ?Ostomy Care Assessment and Management (repouching, etc.) ?PROCESS - Coordination of Care ?[]  - Simple Patient / Family Education for ongoing care 0 ?[]  - 0 ?Complex (extensive) Patient / Family Education for ongoing care ?[]  - 0 ?Staff obtains Consents, Records, Test Results / Process Orders ?[]  - 0 ?Staff telephones HHA, Nursing Homes / Clarify orders / etc ?[]  - 0 ?Routine Transfer to another Facility (non-emergent condition) ?[]  - 0 ?Routine Hospital Admission (non-emergent condition) ?[]  - 0 ?New Admissions / / Ordering NPWT, Apligraf, etc. ?[]  - 0 ?Emergency Hospital Admission (emergent condition) ?PROCESS - Special Needs ?[]  - Pediatric / Minor Patient Management 0 ?[]  - 0 ?Isolation Patient Management ?[]  - 0 ?Hearing / Language / Visual special needs ?[]  - 0 ?Assessment of Community assistance (transportation, D/C planning, etc.) ?[]  - 0 ?Additional assistance / Altered mentation ?[]  - 0 ?Support Surface(s) Assessment (bed, cushion, seat, etc.) ?INTERVENTIONS - Miscellaneous ?[]  - External ear exam 0 ?[]  - 0 ?Patient Transfer (multiple staff / / Similar devices) ?[]  - 0 ?Simple Staple / Suture removal (25 or less) ?[]  - 0 ?Complex Staple / Suture removal (26 or more) ?[]  - 0 ?Hypo/Hyperglycemic Management (do not check if billed separately) ?[]  - 0 ?Ankle / Brachial Index (ABI) - do  not check if billed separately ?Has the patient been seen at the hospital within the last three years: Yes ?Total Score: 0 ?Level  Of Care: ____ ?[name], [name] ([id number]) ?Electronic Signature(s) ?Signed: 02/14/2022 10:35:39 AM By: Yevonne Pax RN ?Entered By: Yevonne Pax on 02/13/2022 08:31:44 ?[name], [name] ([id number]) ?-------------------------------------------------------------------------------- ?Encounter Discharge Information Details ?Patient Name: [name], [name] ?Date of Service: 02/13/2022 8:00 AM ?Medical Record Number: [id number] ?Patient Account Number: [id number] ?Date of Birth/Sex: 04-13-1986 (35 y.o. F) ?Treating RN: Yevonne Pax ?Primary Care Lia Vigilante: Lynnell Chad Other Clinician: ?Referring Thamara Leger: Lynnell Chad ?Treating Brettney Ficken/Extender: Allen Derry ?Weeks in Treatment: 2 ?Encounter Discharge Information Items ?Discharge Condition: Stable ?Ambulatory Status: Wheelchair ?Discharge Destination: Home ?Transportation: Private Auto ?Accompanied By: self ?Schedule Follow-up Appointment: Yes ?Clinical Summary of Care: Patient Declined ?Electronic Signature(s) ?Signed: 02/14/2022 10:35:39 AM By: Yevonne Pax RN ?Entered By: Yevonne Pax on 02/13/2022 08:34:24 ?[name], [name] ([id number]) ?-------------------------------------------------------------------------------- ?Lower Extremity Assessment Details ?Patient Name: [name], [name] ?Date of Service: 02/13/2022 8:00 AM ?Medical Record Number: [id number] ?Patient Account Number: [id number] ?Date of Birth/Sex: 02-15-1986 (35 y.o. F) ?Treating RN: Yevonne Pax ?Primary Care Analee Montee: Lynnell Chad Other Clinician: ?Referring Adalay Azucena: Lynnell Chad ?Treating Leyani Gargus/Extender: Allen Derry ?Weeks in Treatment: 2 ?Electronic Signature(s) ?Signed: 02/14/2022 10:35:39 AM By: Yevonne Pax RN ?Entered By: Yevonne Pax on 02/13/2022 08:16:42 ?[name], [name] ([id number]) ?-------------------------------------------------------------------------------- ?Multi Wound  Chart Details ?Patient Name: [name], [name] ?Date of Service: 02/13/2022 8:00 AM ?Medical Record Number: [id number] ?Patient Account Number: [id number] ?Date of Birth/Sex: Feb 16, 1986 (35 y.o. F) ?Treating RN: Yevonne Pax ?Primary Care Avril Busser: Lynnell Chad Other Clinician: ?Referring Rockell Faulks: Lynnell Chad ?Treating Alayza Pieper/Extender: Allen Derry ?Weeks in Treatment: 2 ?Vital Signs ?Height(in): 67 ?Pulse(bpm): 137 ?Weight(lbs): 219 ?Blood Pressure(mmHg): 127/86 ?Body Mass Index(BMI): 34.3 ?Temperature(??F): 98.2 ?Respiratory Rate(breaths/min): 18 ?Photos: [N/A:N/A] ?Wound Location: Right Amputation Site - Below Knee N/A N/A ?Wounding Event: Gradually Appeared N/A N/A ?Primary Etiology: Abscess N/A N/A ?Comorbid History: Hypertension, Type II Diabetes N/A N/A ?Date Acquired: 01/14/2022 N/A N/A ?Weeks of Treatment: 2 N/A N/A ?Wound Status: Open N/A N/A ?Wound Recurrence: No N/A N/A ?Measurements L x W x D (cm) 6x8.6x3.4 N/A N/A ?Area (cm?) : 40.527 N/A N/A ?Volume (cm?) : 137.79 N/A N/A ?% Reduction in Area: -50.00% N/A N/A ?% Reduction in Volume: -30.80% N/A N/A ?Classification: Full Thickness With Exposed N/A N/A ?Support Structures ?Exudate Amount: Medium N/A N/A ?Exudate Type: Serosanguineous N/A N/A ?Exudate Color: red, brown N/A N/A ?Granulation Amount: Medium (34-66%) N/A N/A ?Granulation Quality: Red, Pink N/A N/A ?Necrotic Amount: Medium (34-66%) N/A N/A ?Exposed Structures: ?Fat Layer (Subcutaneous Tissue): N/A N/A ?Yes ?Muscle: Yes ?Fascia: No ?Tendon: No ?Joint: No ?Bone: No ?Epithelialization: None N/A N/A ?Treatment Notes ?Electronic Signature(s) ?Signed: 02/14/2022 10:35:39 AM By: Yevonne Pax RN ?Entered By: Yevonne Pax on 02/13/2022 08:17:13 ?[name], [name] ([id number]) ?-------------------------------------------------------------------------------- ?Multi-Disciplinary Care Plan Details ?Patient Name: [name], [name] ?Date of Service: 02/13/2022 8:00 AM ?Medical Record Number: [id number] ?Patient  Account Number: [id number] ?Date of Birth/Sex: 07-17-1986 (35 y.o. F) ?Treating RN: Yevonne Pax ?Primary Care Gopal Malter: Lynnell Chad Other Clinician: ?Referring Wynonna Fitzhenry: Lynnell Chad ?Treating Provide

## 2022-02-16 DIAGNOSIS — E11622 Type 2 diabetes mellitus with other skin ulcer: Secondary | ICD-10-CM | POA: Diagnosis not present

## 2022-02-16 NOTE — Progress Notes (Signed)
EVERLENA, MACKLEY ([id number]) Visit Report for 02/16/2022 Arrival Information Details Patient Name: [name], [name] Date of Service: 02/16/2022 8:00 AM Medical Record Number: [id number] Patient Account Number: [id number] Date of Birth/Sex: Apr 19, 1986 (36 y.o. F) Treating RN: Yevonne Pax Primary Care Rodger Giangregorio: Lynnell Chad Other Clinician: Referring Jaqua Ching: Lynnell Chad Treating Maylene Crocker/Extender: Rowan Blase in Treatment: 2 Visit Information History Since Last Visit All ordered tests and consults were completed: No Patient Arrived: Wheel Chair Added or deleted any medications: No Arrival Time: 08:00 Any new allergies or adverse reactions: No Transfer Assistance: None Had a fall or experienced change in No Patient Identification Verified: Yes activities of daily living that may affect Secondary Verification Process Completed: Yes risk of falls: Patient Requires Transmission-Based Precautions: No Signs or symptoms of abuse/neglect since last visito No Patient Has Alerts: Yes Hospitalized since last visit: No Patient Alerts: DIABETIC Implantable device outside of the clinic excluding No cellular tissue based products placed in the center since last visit: Has Dressing in Place as Prescribed: Yes Pain Present Now: No Electronic Signature(s) Signed: 02/16/2022 8:42:12 AM By: Yevonne Pax RN Entered By: Yevonne Pax on 02/16/2022 08:42:12 [name] ([id number]) -------------------------------------------------------------------------------- Clinic Level of Care Assessment Details Patient Name: [name] Date of Service: 02/16/2022 8:00 AM Medical Record Number: [id number] Patient Account Number: [id number] Date of Birth/Sex: January 21, 1986 (36 y.o. F) Treating RN: Yevonne Pax Primary Care Chiron Campione: Lynnell Chad Other Clinician: Referring Kirby Cortese: Lynnell Chad Treating Salvador Coupe/Extender: Rowan Blase in Treatment: 2 Clinic Level of Care Assessment  Items TOOL 1 Quantity Score []  - Use when EandM and Procedure is performed on INITIAL visit 0 ASSESSMENTS - Nursing Assessment / Reassessment []  - General Physical Exam (combine w/ comprehensive assessment (listed just below) when performed on new 0 pt. evals) []  - 0 Comprehensive Assessment (HX, ROS, Risk Assessments, Wounds Hx, etc.) ASSESSMENTS - Wound and Skin Assessment / Reassessment []  - Dermatologic / Skin Assessment (not related to wound area) 0 ASSESSMENTS - Ostomy and/or Continence Assessment and Care []  - Incontinence Assessment and Management 0 []  - 0 Ostomy Care Assessment and Management (repouching, etc.) PROCESS - Coordination of Care []  - Simple Patient / Family Education for ongoing care 0 []  - 0 Complex (extensive) Patient / Family Education for ongoing care []  - 0 Staff obtains , Records, Test Results / Process Orders []  - 0 Staff telephones HHA, Nursing Homes / Clarify orders / etc []  - 0 Routine Transfer to another Facility (non-emergent condition) []  - 0 Routine Hospital Admission (non-emergent condition) []  - 0 New Admissions / / Ordering NPWT, Apligraf, etc. []  - 0 Emergency Hospital Admission (emergent condition) PROCESS - Special Needs []  - Pediatric / Minor Patient Management 0 []  - 0 Isolation Patient Management []  - 0 Hearing / Language / Visual special needs []  - 0 Assessment of Community assistance (transportation, D/C planning, etc.) []  - 0 Additional assistance / Altered mentation []  - 0 Support Surface(s) Assessment (bed, cushion, seat, etc.) INTERVENTIONS - Miscellaneous []  - External ear exam 0 []  - 0 Patient Transfer (multiple staff / / Similar devices) []  - 0 Simple Staple / Suture removal (25 or less) []  - 0 Complex Staple / Suture removal (26 or more) []  - 0 Hypo/Hyperglycemic Management (do not check if billed separately) []  - 0 Ankle / Brachial Index (ABI) - do not check if  billed separately Has the patient been seen at the hospital within the last three years: Yes Total Score: 0 Level Of Care: ____  [name], [name] ([id number]) Electronic Signature(s) Unsigned Entered By: Yevonne Pax on 02/16/2022 08:44:34 Signature(s): Date(s): [name] ([id number]) -------------------------------------------------------------------------------- Encounter Discharge Information Details Patient Name: [name], [name] Date of Service: 02/16/2022 8:00 AM Medical Record Number: [id number] Patient Account Number: [id number] Date of Birth/Sex: Apr 04, 1986 (35 y.o. F) Treating RN: Yevonne Pax Primary Care Guiseppe Flanagan: Lynnell Chad Other Clinician: Referring Sharmayne Jablon: Lynnell Chad Treating Jonel Weldon/Extender: Rowan Blase in Treatment: 2 Encounter Discharge Information Items Discharge Condition: Stable Ambulatory Status: Walker Discharge Destination: Home Transportation: Other Accompanied By: self Schedule Follow-up Appointment: Yes Clinical Summary of Care: Patient Declined Electronic Signature(s) Signed: 02/16/2022 8:44:27 AM By: Yevonne Pax RN Entered By: Yevonne Pax on 02/16/2022 08:44:27 [name] ([id number]) -------------------------------------------------------------------------------- Negative Pressure Wound Therapy Maintenance (NPWT) Details Patient Name: [name], [name] Date of Service: 02/16/2022 8:00 AM Medical Record Number: [id number] Patient Account Number: [id number] Date of Birth/Sex: 05/31/1986 (35 y.o. F) Treating RN: Yevonne Pax Primary Care Kiwan Gadsden: Lynnell Chad Other Clinician: Referring Maira Christon: Lynnell Chad Treating Carmilla Granville/Extender: Rowan Blase in Treatment: 2 NPWT Maintenance Performed for: Wound #1 Right Amputation Site - Below Knee Performed By: Yevonne Pax, RN Type: Other Coverage Size (sq cm): 51.6 Pressure Type: Constant Pressure Setting: 125 mmHG Drain Type: None Primary Contact: Silver Sponge/Dressing  Type: Foam, Black Date Initiated: 01/23/2022 Dressing Removed: No Quantity of Sponges/Gauze Removed: 3 Canister Changed: No Dressing Reapplied: No Quantity of Sponges/Gauze Inserted: 3 Days On NPWT: 25 Electronic Signature(s) Signed: 02/16/2022 8:43:51 AM By: Yevonne Pax RN Entered By: Yevonne Pax on 02/16/2022 08:43:51 [name] ([id number]) -------------------------------------------------------------------------------- Wound Assessment Details Patient Name: [name] Date of Service: 02/16/2022 8:00 AM Medical Record Number: [id number] Patient Account Number: [id number] Date of Birth/Sex: 1986/08/25 (36 y.o. F) Treating RN: Yevonne Pax Primary Care Smera Guyette: Lynnell Chad Other Clinician: Referring Raif Chachere: Lynnell Chad Treating Dirk Vanaman/Extender: Rowan Blase in Treatment: 2 Wound Status Wound Number: 1 Primary Etiology: Abscess Wound Location: Right Amputation Site - Below Knee Wound Status: Open Wounding Event: Gradually Appeared Comorbid History: Hypertension, Type II Diabetes Date Acquired: 01/14/2022 Weeks Of Treatment: 2 Clustered Wound: No Wound Measurements Length: (cm) 6 Width: (cm) 8.6 Depth: (cm) 3.4 Area: (cm) 40.527 Volume: (cm) 137.79 % Reduction in Area: -50% % Reduction in Volume: -30.8% Epithelialization: None Tunneling: No Undermining: No Wound Description Classification: Full Thickness With Exposed Support Structures Exudate Amount: Medium Exudate Type: Serosanguineous Exudate Color: red, brown Foul Odor After Cleansing: No Slough/Fibrino Yes Wound Bed Granulation Amount: Large (67-100%) Exposed Structure Granulation Quality: Red, Pink Fascia Exposed: No Necrotic Amount: Small (1-33%) Fat Layer (Subcutaneous Tissue) Exposed: Yes Necrotic Quality: Adherent Slough Tendon Exposed: No Muscle Exposed: Yes Necrosis of Muscle: No Joint Exposed: No Bone Exposed: No Treatment Notes Wound #1 (Amputation Site - Below Knee)  Wound Laterality: Right Cleanser Peri-Wound Care Topical Primary Dressing Secondary Dressing Secured With Compression Wrap Compression Stockings Add-Ons Electronic Signature(s) Signed: 02/16/2022 8:43:07 AM By: Yevonne Pax RN Entered By: Yevonne Pax on 02/16/2022 08:43:07

## 2022-02-18 NOTE — Progress Notes (Signed)
COLINDA, ZYLA ([id number]) Visit Report for 02/16/2022 Physician Orders Details Patient Name: [name], [name] Date of Service: 02/16/2022 8:00 AM Medical Record Number: [id number] Patient Account Number: [id number] Date of Birth/Sex: Feb 09, 1986 (36 y.o. F) Treating RN: Carlene Coria Primary Care Provider: Jac Canavan Other Clinician: Referring Provider: Jac Canavan Treating Provider/Extender: Skipper Cliche in Treatment: 2 Verbal / Phone Orders: No Diagnosis Coding Follow-up Appointments o Return Appointment in 1 week. o Nurse Visit as needed - thursday Bathing/ Shower/ Hygiene o No tub bath. Negative Pressure Wound Therapy o Wound VAC settings at 131mmHg continuous pressure. Use foam to wound cavity. Please order WHITE foam to fill any tunnel/s and/or undermining when necessary. Change VAC dressing 2 X WEEK. Change canister as indicated when full. o Number of foam/gauze pieces used in the dressing = - 2 o Other: - wet to dry if wound vac malfunctions Wound Treatment Electronic Signature(s) Signed: 02/16/2022 8:44:06 AM By: Carlene Coria RN Signed: 02/16/2022 5:51:16 PM By: Worthy Keeler PA-C Entered By: Carlene Coria on 02/16/2022 08:44:06 [name] ([id number]) -------------------------------------------------------------------------------- SuperBill Details Patient Name: [name] Date of Service: 02/16/2022 Medical Record Number: [id number] Patient Account Number: [id number] Date of Birth/Sex: 1985-11-12 (35 y.o. 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

## 2022-02-20 ENCOUNTER — Encounter: Payer: Medicaid Other | Admitting: Physician Assistant

## 2022-02-20 ENCOUNTER — Ambulatory Visit: Payer: Medicaid Other | Admitting: Physician Assistant

## 2022-02-20 DIAGNOSIS — E11622 Type 2 diabetes mellitus with other skin ulcer: Secondary | ICD-10-CM | POA: Diagnosis not present

## 2022-02-20 NOTE — Progress Notes (Signed)
MARCY, SOOKDEO ([id number]) Visit Report for 02/20/2022 Arrival Information Details Patient Name: [name], [name] Date of Service: 02/20/2022 8:00 AM Medical Record Number: [id number] Patient Account Number: [id number] Date of Birth/Sex: [birth year] (36 y.o. F) Treating RN: Yevonne Pax Primary Care Cade Olberding: Lynnell Chad Other Clinician: Referring Rhylin Venters: Lynnell Chad Treating Brandom Kerwin/Extender: Rowan Blase in Treatment: 3 Visit Information History Since Last Visit All ordered tests and consults were completed: No Patient Arrived: Wheel Chair Added or deleted any medications: No Arrival Time: 07:58 Any new allergies or adverse reactions: No Accompanied By: self Had a fall or experienced change in No Transfer Assistance: None activities of daily living that may affect Patient Identification Verified: Yes risk of falls: Secondary Verification Process Completed: Yes Signs or symptoms of abuse/neglect since last visito No Patient Requires Transmission-Based Precautions: No Hospitalized since last visit: No Patient Has Alerts: Yes Implantable device outside of the clinic excluding No Patient Alerts: DIABETIC cellular tissue based products placed in the center since last visit: Has Dressing in Place as Prescribed: Yes Pain Present Now: No Electronic Signature(s) Signed: 02/20/2022 4:02:04 PM By: Yevonne Pax RN Entered By: Yevonne Pax on 02/20/2022 08:04:40 [name] ([id number]) -------------------------------------------------------------------------------- Clinic Level of Care Assessment Details Patient Name: [name] Date of Service: 02/20/2022 8:00 AM Medical Record Number: [id number] Patient Account Number: [id number] Date of Birth/Sex: Dec 31, 1985 (35 y.o. F) Treating RN: Yevonne Pax Primary Care Kristie Bracewell: Lynnell Chad Other Clinician: Referring Kaion Tisdale: Lynnell Chad Treating Dareld Mcauliffe/Extender: Rowan Blase in Treatment: 3 Clinic Level of  Care Assessment Items TOOL 4 Quantity Score  - Use when only an EandM is performed on FOLLOW-UP visit 0 ASSESSMENTS - Nursing Assessment / Reassessment  - Reassessment of Co-morbidities (includes updates in patient status) 0  - 0 Reassessment of Adherence to Treatment Plan ASSESSMENTS - Wound and Skin Assessment / Reassessment  - Simple Wound Assessment / Reassessment - one wound 0  - 0 Complex Wound Assessment / Reassessment - multiple wounds  - 0 Dermatologic / Skin Assessment (not related to wound area) ASSESSMENTS - Focused Assessment  - Circumferential Edema Measurements - multi extremities 0  - 0 Nutritional Assessment / Counseling / Intervention  - 0 Lower Extremity Assessment (monofilament, tuning fork, pulses)  - 0 Peripheral Arterial Disease Assessment (using hand held doppler) ASSESSMENTS - Ostomy and/or Continence Assessment and Care  - Incontinence Assessment and Management 0  - 0 Ostomy Care Assessment and Management (repouching, etc.) PROCESS - Coordination of Care  - Simple Patient / Family Education for ongoing care 0  - 0 Complex (extensive) Patient / Family Education for ongoing care  - 0 Staff obtains Chiropractor, Records, Test Results / Process Orders  - 0 Staff telephones HHA, Nursing Homes / Clarify orders / etc  - 0 Routine Transfer to another Facility (non-emergent condition)  - 0 Routine Hospital Admission (non-emergent condition)  - 0 New Admissions / Manufacturing engineer / Ordering NPWT, Apligraf, etc.  - 0 Emergency Hospital Admission (emergent condition)  - 0 Simple Discharge Coordination  - 0 Complex (extensive) Discharge Coordination PROCESS - Special Needs  - Pediatric / Minor Patient Management 0  - 0 Isolation Patient Management  - 0 Hearing / Language / Visual special needs  - 0 Assessment of Community assistance (transportation, D/C planning, etc.)  - 0 Additional  assistance / Altered mentation  - 0 Support Surface(s) Assessment (bed, cushion, seat, etc.) INTERVENTIONS - Wound Cleansing / Measurement Slayton, Maritza ([id number])  - 0 Simple Wound Cleansing - one wound  - 0  Complex Wound Cleansing - multiple wounds []  - 0 Wound Imaging (photographs - any number of wounds) []  - 0 Wound Tracing (instead of photographs) []  - 0 Simple Wound Measurement - one wound []  - 0 Complex Wound Measurement - multiple wounds INTERVENTIONS - Wound Dressings []  - Small Wound Dressing one or multiple wounds 0 []  - 0 Medium Wound Dressing one or multiple wounds []  - 0 Large Wound Dressing one or multiple wounds []  - 0 Application of Medications - topical []  - 0 Application of Medications - injection INTERVENTIONS - Miscellaneous []  - External ear exam 0 []  - 0 Specimen Collection (cultures, biopsies, blood, body fluids, etc.) []  - 0 Specimen(s) / Culture(s) sent or taken to Lab for analysis []  - 0 Patient Transfer (multiple staff / / Similar devices) []  - 0 Simple Staple / Suture removal (25 or less) []  - 0 Complex Staple / Suture removal (26 or more) []  - 0 Hypo / Hyperglycemic Management (close monitor of Blood Glucose) []  - 0 Ankle / Brachial Index (ABI) - do not check if billed separately []  - 0 Vital Signs Has the patient been seen at the hospital within the last three years: Yes Total Score: 0 Level Of Care: ____ Electronic Signature(s) Signed: 02/20/2022 4:02:04 PM By: RN Entered By: on 02/20/2022 08:23:33 ( ) -------------------------------------------------------------------------------- Encounter Discharge Information Details Patient Name: Date of Service: 02/20/2022 8:00 AM Medical Record Number: Patient Account Number: Date of Birth/Sex: Oct 08, 1985 (35 y.o. F) Treating RN: Nurse, adult Primary Care Arpan Eskelson: Other  Clinician: Referring Emmarie Sannes: Treating Zalika Tieszen/Extender: in Treatment: 3 Encounter Discharge Information Items Discharge Condition: Stable Ambulatory Status: Wheelchair Discharge Destination: Home Transportation: Private Auto Accompanied By: self Schedule Follow-up Appointment: Yes Clinical Summary of Care: Patient Declined Electronic Signature(s) Signed: 02/20/2022 4:02:04 PM By: RN Entered By: 02/22/2022 on 02/20/2022 08:24:32 Yevonne Pax (02/22/2022) -------------------------------------------------------------------------------- Lower Extremity Assessment Details Patient Name: [name] Date of Service: 02/20/2022 8:00 AM Medical Record Number: [name] Patient Account Number: 02/22/2022 Date of Birth/Sex: 1986-03-25 (35 y.o. F) Treating RN: 09/17/1986 Primary Care Louis Ivery: 10-05-1973 Other Clinician: Referring Anubis Fundora: Yevonne Pax Treating Uzma Hellmer/Extender: Lynnell Chad in Treatment: 3 Electronic Signature(s) Signed: 02/20/2022 4:02:04 PM By: Rowan Blase RN Entered By: 02/22/2022 on 02/20/2022 08:17:10 Yevonne Pax (02/22/2022) -------------------------------------------------------------------------------- Multi Wound Chart Details Patient Name: [name] Date of Service: 02/20/2022 8:00 AM Medical Record Number: [name] Patient Account Number: 02/22/2022 Date of Birth/Sex: 04-07-1986 (35 y.o. F) Treating RN: 09/17/1986 Primary Care Velvet Moomaw: 10-05-1973 Other Clinician: Referring Kristofer Schaffert: Yevonne Pax Treating Nestor Wieneke/Extender: Lynnell Chad in Treatment: 3 Vital Signs Height(in): 67 Pulse(bpm): 128 Weight(lbs): 219 Blood Pressure(mmHg): 126/80 Body Mass Index(BMI): 34.3 Temperature(F): 98.4 Respiratory Rate(breaths/min): 18 Photos: [N/A:N/A] Wound Location: Right Amputation Site - Below Knee N/A N/A Wounding Event: Gradually Appeared N/A N/A Primary Etiology:  Abscess N/A N/A Comorbid History: Hypertension, Type II Diabetes N/A N/A Date Acquired: 01/14/2022 N/A N/A Weeks of Treatment: 3 N/A N/A Wound Status: Open N/A N/A Wound Recurrence: No N/A N/A Measurements L x W x D (cm) 6x8.5x3.4 N/A N/A Area (cm) : 40.055 N/A N/A Volume (cm) : 136.188 N/A N/A % Reduction in Area: -48.30% N/A N/A % Reduction in Volume: -29.20% N/A N/A Classification: Full Thickness With Exposed N/A N/A Support Structures Exudate Amount: Medium N/A N/A Exudate Type: Serosanguineous N/A N/A Exudate Color: red, brown N/A N/A Granulation Amount: Large (  67-100%) N/A N/A Granulation Quality: Red, Pink N/A N/A Necrotic Amount: Small (1-33%) N/A N/A Exposed Structures: Fat Layer (Subcutaneous Tissue): N/A N/A Yes Muscle: Yes Fascia: No Tendon: No Joint: No Bone: No Epithelialization: None N/A N/A Treatment Notes Electronic Signature(s) Signed: 02/20/2022 4:02:04 PM By: Yevonne PaxEpps, Carrie RN Entered By: Yevonne PaxEpps, Carrie on 02/20/2022 08:17:48 Dewitt RotaHYLTON, Kandyce ([id number]) -------------------------------------------------------------------------------- Multi-Disciplinary Care Plan Details Patient Name: Dewitt RotaHYLTON, Tyiana Date of Service: 02/20/2022 8:00 AM Medical Record Number: [id number] Patient Account Number: [id number] Date of Birth/Sex: 12-Apr-1986 (35 y.o. F) Treating RN: Yevonne PaxEpps, Carrie Primary Care Roxy Mastandrea: Lynnell ChadIjaola, Onyeje Other Clinician: Referring Twyla Dais: Lynnell ChadIjaola, Onyeje Treating Tyrea Froberg/Extender: Rowan BlaseStone, Hoyt Weeks in Treatment: 3 Active Inactive Wound/Skin Impairment Nursing Diagnoses: Knowledge deficit related to ulceration/compromised skin integrity Goals: Patient/caregiver will verbalize understanding of skin care regimen Date Initiated: 01/27/2022 Target Resolution Date: 02/26/2022 Goal Status: Active Ulcer/skin breakdown will have a volume reduction of 30% by week 4 Date Initiated: 01/27/2022 Target Resolution Date: 03/29/2022 Goal Status: Active Ulcer/skin  breakdown will have a volume reduction of 50% by week 8 Date Initiated: 01/27/2022 Target Resolution Date: 04/28/2022 Goal Status: Active Ulcer/skin breakdown will have a volume reduction of 80% by week 12 Date Initiated: 01/27/2022 Target Resolution Date: 05/29/2022 Goal Status: Active Ulcer/skin breakdown will heal within 14 weeks Date Initiated: 01/27/2022 Target Resolution Date: 06/29/2022 Goal Status: Active Interventions: Assess patient/caregiver ability to obtain necessary supplies Assess patient/caregiver ability to perform ulcer/skin care regimen upon admission and as needed Assess ulceration(s) every visit Notes: Electronic Signature(s) Signed: 02/20/2022 4:02:04 PM By: Yevonne PaxEpps, Carrie RN Entered By: Yevonne PaxEpps, Carrie on 02/20/2022 08:17:33 Dewitt RotaHYLTON, Karelly ([id number]) -------------------------------------------------------------------------------- Negative Pressure Wound Therapy Maintenance (NPWT) Details Patient Name: Dewitt RotaHYLTON, Estefani Date of Service: 02/20/2022 8:00 AM Medical Record Number: [id number] Patient Account Number: [id number] Date of Birth/Sex: 12-Apr-1986 (36 y.o. F) Treating RN: Yevonne PaxEpps, Carrie Primary Care Terrance Usery: Lynnell ChadIjaola, Onyeje Other Clinician: Referring Hristopher Missildine: Lynnell ChadIjaola, Onyeje Treating Zurri Rudden/Extender: Rowan BlaseStone, Hoyt Weeks in Treatment: 3 NPWT Maintenance Performed for: Wound #1 Right Amputation Site - Below Knee Performed By: Yevonne PaxEpps, Carrie, RN Type: Other Coverage Size (sq cm): 51 Pressure Type: Constant Pressure Setting: 125 mmHG Drain Type: None Primary Contact: Other : Prisma Sponge/Dressing Type: Foam, Black Date Initiated: 01/23/2022 Dressing Removed: No Quantity of Sponges/Gauze Removed: 3 Canister Changed: No Dressing Reapplied: No Quantity of Sponges/Gauze Inserted: 3 Respones To Treatment: pt tolerated well Days On NPWT: 29 Post Procedure Diagnosis Same as Pre-procedure Electronic Signature(s) Signed: 02/20/2022 4:02:04 PM By: Yevonne PaxEpps, Carrie RN Entered  By: Yevonne PaxEpps, Carrie on 02/20/2022 08:22:55 Dewitt RotaHYLTON, Hamsini ([id number]) -------------------------------------------------------------------------------- Pain Assessment Details Patient Name: Dewitt RotaHYLTON, Omesha Date of Service: 02/20/2022 8:00 AM Medical Record Number: [id number] Patient Account Number: [id number] Date of Birth/Sex: 12-Apr-1986 (35 y.o. F) Treating RN: Yevonne PaxEpps, Carrie Primary Care Jannie Doyle: Lynnell ChadIjaola, Onyeje Other Clinician: Referring Darlena Koval: Lynnell ChadIjaola, Onyeje Treating Ruba Outen/Extender: Rowan BlaseStone, Hoyt Weeks in Treatment: 3 Active Problems Location of Pain Severity and Description of Pain Patient Has Paino No Site Locations Pain Management and Medication Current Pain Management: Electronic Signature(s) Signed: 02/20/2022 4:02:04 PM By: Yevonne PaxEpps, Carrie RN Entered By: Yevonne PaxEpps, Carrie on 02/20/2022 08:05:08 Dewitt RotaHYLTON, Maeola ([id number]) -------------------------------------------------------------------------------- Patient/Caregiver Education Details Patient Name: Dewitt RotaHYLTON, Joreen Date of Service: 02/20/2022 8:00 AM Medical Record Number: [id number] Patient Account Number: [id number] Date of Birth/Gender: 12-Apr-1986 (35 y.o. F) Treating RN: Yevonne PaxEpps, Carrie Primary Care Physician: Lynnell ChadIjaola, Onyeje Other Clinician: Referring Physician: Lynnell ChadIjaola, Onyeje Treating Physician/Extender: Rowan BlaseStone, Hoyt Weeks in Treatment: 3 Education Assessment Education Provided To: Patient Education Topics Provided Wound/Skin Impairment: Methods: Explain/Verbal Responses: State content correctly Electronic Signature(s) Signed:  02/20/2022 4:02:04 PM By: Yevonne Pax RN Entered By: Yevonne Pax on 02/20/2022 08:23:58 [name] ([id number]) -------------------------------------------------------------------------------- Wound Assessment Details Patient Name: [name] Date of Service: 02/20/2022 8:00 AM Medical Record Number: [id number] Patient Account Number: [id number] Date of Birth/Sex: [birth year] (35 y.o.  F) Treating RN: Yevonne Pax Primary Care Fraya Ueda: Lynnell Chad Other Clinician: Referring Tamantha Saline: Lynnell Chad Treating Tniya Bowditch/Extender: Rowan Blase in Treatment: 3 Wound Status Wound Number: 1 Primary Etiology: Abscess Wound Location: Right Amputation Site - Below Knee Wound Status: Open Wounding Event: Gradually Appeared Comorbid History: Hypertension, Type II Diabetes Date Acquired: 01/14/2022 Weeks Of Treatment: 3 Clustered Wound: No Photos Wound Measurements Length: (cm) 6 Width: (cm) 8.5 Depth: (cm) 3.4 Area: (cm) 40.055 Volume: (cm) 136.188 % Reduction in Area: -48.3% % Reduction in Volume: -29.2% Epithelialization: None Tunneling: No Undermining: No Wound Description Classification: Full Thickness With Exposed Support Structures Exudate Amount: Medium Exudate Type: Serosanguineous Exudate Color: red, brown Foul Odor After Cleansing: No Slough/Fibrino Yes Wound Bed Granulation Amount: Large (67-100%) Exposed Structure Granulation Quality: Red, Pink Fascia Exposed: No Necrotic Amount: Small (1-33%) Fat Layer (Subcutaneous Tissue) Exposed: Yes Necrotic Quality: Adherent Slough Tendon Exposed: No Muscle Exposed: Yes Necrosis of Muscle: No Joint Exposed: No Bone Exposed: No Treatment Notes Wound #1 (Amputation Site - Below Knee) Wound Laterality: Right Cleanser Peri-Wound Care Topical Leaman, Ysabella ([id number]) Primary Dressing Prisma 4.34 (in) Discharge Instruction: Moisten w/normal saline or sterile water; Cover wound as directed. Do not remove from wound bed. Secondary Dressing Secured With Compression Wrap Compression Stockings Add-Ons Electronic Signature(s) Signed: 02/20/2022 4:02:04 PM By: Yevonne Pax RN Entered By: Yevonne Pax on 02/20/2022 08:16:27 [name] ([id number]) -------------------------------------------------------------------------------- Vitals Details Patient Name: [name] Date of Service:  02/20/2022 8:00 AM Medical Record Number: [id number] Patient Account Number: [id number] Date of Birth/Sex: [birth year] (35 y.o. F) Treating RN: Yevonne Pax Primary Care Brihanna Devenport: Lynnell Chad Other Clinician: Referring Jorryn Casagrande: Lynnell Chad Treating Trayson Stitely/Extender: Rowan Blase in Treatment: 3 Vital Signs Time Taken: 08:04 Temperature (F): 98.4 Height (in): 67 Pulse (bpm): 128 Weight (lbs): 219 Respiratory Rate (breaths/min): 18 Body Mass Index (BMI): 34.3 Blood Pressure (mmHg): 126/80 Reference Range: 80 - 120 mg / dl Electronic Signature(s) Signed: 02/20/2022 4:02:04 PM By: Yevonne Pax RN Entered By: Yevonne Pax on 02/20/2022 08:04:59

## 2022-02-20 NOTE — Progress Notes (Addendum)
[name] ([id number]) Visit Report for 02/20/2022 Chief Complaint Document Details Patient Name: [name] Date of Service: 02/20/2022 8:00 AM Medical Record Number: [id number] Patient Account Number: [id number] Date of Birth/Sex: [birth year] (36 y.o. F) Treating RN: [name] Primary Care Provider: Jac Howell Other Clinician: Referring Provider: Jac Howell Treating Provider/Extender: [name] in Treatment: 3 Information Obtained from: Patient Chief Complaint Right BKA Amputation Site Disruption Electronic Signature(s) Signed: 02/20/2022 8:14:35 AM By: Allison Keeler PA-C Entered By: [name] on 02/20/2022 08:14:35 [name] ([id number]) -------------------------------------------------------------------------------- HPI Details Patient Name: [name] Date of Service: 02/20/2022 8:00 AM Medical Record Number: [id number] Patient Account Number: [id number] Date of Birth/Sex: 23-Feb-1986 (36 y.o. F) Treating RN: [name] Primary Care Provider: Jac Howell Other Clinician: Referring Provider: Jac Howell Treating Provider/Extender: [name] in Treatment: 3 History of Present Illness HPI Description: 01-27-2022 upon evaluation today patient appears to be doing somewhat poorly in regard to her right below-knee amputation. This unfortunately developed a abscess internally following the initial amputation which was January 2023. Subsequently she was getting very close to starting to work on the prosthesis when she had an area that somewhat bulged out ended up being an abscess and the incision and drainage was performed April 22. Subsequently she did have antibiotic beads placed in a wound VAC after this was cleared out. Her most recent reported hemoglobin A1c was 6.4. With that being said the patient tells me that her blood sugars seem to be under pretty good control as best she can tell at this point. She also tells me that the  wound VAC seems to be functioning appropriately although upon evaluation the did not appear to be anything tucked into the region of undermining and depth in the roughly 9-12 o'clock location based on what we saw today. This is also where the antibiotic beads are and I can see those in place. I do believe that she is getting need to have the wound VAC tucked down into this location. Patient does have a history of diabetes mellitus type 2, diabetic neuropathy, and hypertension. This initial amputation occurred as a result of her having sewing needles that actually went into her heel she did not know about it started having trouble with her heel and ended up with a significant infection which was stated to be necrotizing and she subsequently had to have emergency amputation. 02-13-2022 upon evaluation today patient appears to be doing well with regard to her wound. She has been tolerating the dressing changes without complication. The wound VAC does seem to be doing a great job. Fortunately I do not see any evidence of active infection locally or systemically at this time which is great news. She still has some of the antibiotic beads in the base of the wound. 02-20-2022 upon evaluation today patient appears to be doing well currently. There does not appear to be any evidence of active infection locally nor systemically which is great news and overall I am extremely pleased with where we stand today. I do believe that the wound VAC is doing a good job here and the color of the drainage is improved again were unsure as to whether this may have been the antibiotic beads or if there was something such as Pseudomonas causing the greenish discoloration to the drainage either way she is doing significantly better. Electronic Signature(s) Signed: 02/20/2022 9:00:57 AM By: Allison Keeler PA-C Entered By: [name] on 02/20/2022 09:00:57 [name]  ([id number]) -------------------------------------------------------------------------------- Physical  Exam Details Patient Name: [name], [name] Date of Service: 02/20/2022 8:00 AM Medical Record Number: [id number] Patient Account Number: [id number] Date of Birth/Sex: [birth year] (36 y.o. F) Treating RN: [name] Primary Care Provider: Jac Howell Other Clinician: Referring Provider: Jac Howell Treating Provider/Extender: [name] in Treatment: 3 Constitutional Well-nourished and well-hydrated in no acute distress. Respiratory normal breathing without difficulty. Psychiatric this patient is able to make decisions and demonstrates good insight into disease process. Alert and Oriented x 3. pleasant and cooperative. Notes Upon inspection patient's wound bed actually showed signs of good granulation and epithelization at this point. Fortunately there does not appear to be any evidence of infection locally nor systemically which is great news and overall I am extremely pleased with where we stand currently. Electronic Signature(s) Signed: 02/20/2022 9:01:25 AM By: Allison Keeler PA-C Entered By: [name] on 02/20/2022 09:01:25 [name] ([id number]) -------------------------------------------------------------------------------- Physician Orders Details Patient Name: [name] Date of Service: 02/20/2022 8:00 AM Medical Record Number: [id number] Patient Account Number: [id number] Date of Birth/Sex: 02-13-1986 (36 y.o. F) Treating RN: [name] Primary Care Provider: Jac Howell Other Clinician: Referring Provider: Jac Howell Treating Provider/Extender: [name] in Treatment: 3 Verbal / Phone Orders: No Diagnosis Coding ICD-10 Coding Code Description E11.622 Type 2 diabetes mellitus with other skin ulcer T81.31XA Disruption of external operation (surgical) wound, not elsewhere classified, initial encounter L97.812 Non-pressure  chronic ulcer of other part of right lower leg with fat layer exposed E11.43 Type 2 diabetes mellitus with diabetic autonomic (poly)neuropathy I10 Essential (primary) hypertension Follow-up Appointments o Return Appointment in 1 week. o Nurse Visit as needed - thursday Bathing/ Shower/ Hygiene o No tub bath. Negative Pressure Wound Therapy o Wound VAC settings at 154mmHg continuous pressure. Use foam to wound cavity. Please order WHITE foam to fill any tunnel/s and/or undermining when necessary. Change VAC dressing 2 X WEEK. Change canister as indicated when full. o Number of foam/gauze pieces used in the dressing = - 3 o Other: - wet to dry if wound vac malfunctions Wound Treatment Wound #1 - Amputation Site - Below Knee Wound Laterality: Right Primary Dressing: Prisma 4.34 (in) Discharge Instructions: Moisten w/normal saline or sterile water; Cover wound as directed. Do not remove from wound bed. Electronic Signature(s) Signed: 02/20/2022 4:02:04 PM By: Allison Coria RN Signed: 02/20/2022 4:26:20 PM By: Allison Keeler PA-C Entered By: [name] on 02/20/2022 08:23:16 [name] ([id number]) -------------------------------------------------------------------------------- Problem List Details Patient Name: [name] Date of Service: 02/20/2022 8:00 AM Medical Record Number: [id number] Patient Account Number: [id number] Date of Birth/Sex: 07/23/1986 (35 y.o. F) Treating RN: [name] Primary Care Provider: Jac Howell Other Clinician: Referring Provider: Jac Howell Treating Provider/Extender: [name] in Treatment: 3 Active Problems ICD-10 Encounter Code Description Active Date MDM Diagnosis E11.622 Type 2 diabetes mellitus with other skin ulcer 01/27/2022 No Yes T81.31XA Disruption of external operation (surgical) wound, not elsewhere 01/27/2022 No Yes classified, initial encounter L97.812 Non-pressure chronic ulcer of other part of right  lower leg with fat layer 01/27/2022 No Yes exposed E11.43 Type 2 diabetes mellitus with diabetic autonomic (poly)neuropathy 01/27/2022 No Yes I10 Essential (primary) hypertension 01/27/2022 No Yes Inactive Problems Resolved Problems Electronic Signature(s) Signed: 02/20/2022 8:14:32 AM By: Allison Keeler PA-C Entered By: [name] on 02/20/2022 08:14:31 [name] ([id number]) -------------------------------------------------------------------------------- Progress Note Details Patient Name: [name] Date of Service: 02/20/2022 8:00 AM Medical Record Number: [id number] Patient Account Number: [id number] Date of Birth/Sex: [birth year] (35 y.o. F) Treating  RN: [name] Primary Care Provider: Jac Howell Other Clinician: Referring Provider: Jac Howell Treating Provider/Extender: [name] in Treatment: 3 Subjective Chief Complaint Information obtained from Patient Right BKA Amputation Site Disruption History of Present Illness (HPI) 01-27-2022 upon evaluation today patient appears to be doing somewhat poorly in regard to her right below-knee amputation. This unfortunately developed a abscess internally following the initial amputation which was January 2023. Subsequently she was getting very close to starting to work on the prosthesis when she had an area that somewhat bulged out ended up being an abscess and the incision and drainage was performed April 22. Subsequently she did have antibiotic beads placed in a wound VAC after this was cleared out. Her most recent reported hemoglobin A1c was 6.4. With that being said the patient tells me that her blood sugars seem to be under pretty good control as best she can tell at this point. She also tells me that the wound VAC seems to be functioning appropriately although upon evaluation the did not appear to be anything tucked into the region of undermining and depth in the roughly 9-12 o'clock location based on what  we saw today. This is also where the antibiotic beads are and I can see those in place. I do believe that she is getting need to have the wound VAC tucked down into this location. Patient does have a history of diabetes mellitus type 2, diabetic neuropathy, and hypertension. This initial amputation occurred as a result of her having sewing needles that actually went into her heel she did not know about it started having trouble with her heel and ended up with a significant infection which was stated to be necrotizing and she subsequently had to have emergency amputation. 02-13-2022 upon evaluation today patient appears to be doing well with regard to her wound. She has been tolerating the dressing changes without complication. The wound VAC does seem to be doing a great job. Fortunately I do not see any evidence of active infection locally or systemically at this time which is great news. She still has some of the antibiotic beads in the base of the wound. 02-20-2022 upon evaluation today patient appears to be doing well currently. There does not appear to be any evidence of active infection locally nor systemically which is great news and overall I am extremely pleased with where we stand today. I do believe that the wound VAC is doing a good job here and the color of the drainage is improved again were unsure as to whether this may have been the antibiotic beads or if there was something such as Pseudomonas causing the greenish discoloration to the drainage either way she is doing significantly better. Objective Constitutional Well-nourished and well-hydrated in no acute distress. Vitals Time Taken: 8:04 AM, Height: 67 in, Weight: 219 lbs, BMI: 34.3, Temperature: 98.4 F, Pulse: 128 bpm, Respiratory Rate: 18 breaths/min, Blood Pressure: 126/80 mmHg. Respiratory normal breathing without difficulty. Psychiatric this patient is able to make decisions and demonstrates good insight into disease  process. Alert and Oriented x 3. pleasant and cooperative. General Notes: Upon inspection patient's wound bed actually showed signs of good granulation and epithelization at this point. Fortunately there does not appear to be any evidence of infection locally nor systemically which is great news and overall I am extremely pleased with where we stand currently. Integumentary (Hair, Skin) Wound #1 status is Open. Original cause of wound was Gradually Appeared. The date acquired was: 01/14/2022. The wound has  been in treatment 3 weeks. The wound is located on the Right Amputation Site - Below Knee. The wound measures 6cm length x 8.5cm width x 3.4cm depth; 40.055cm^2 area and 136.188cm^3 volume. There is muscle and Fat Layer (Subcutaneous Tissue) exposed. There is no tunneling or undermining noted. There is a medium amount of serosanguineous drainage noted. There is large (67-100%) red, pink granulation within the wound bed. There is a small (1-33%) amount of necrotic tissue within the wound bed including Adherent Slough. RENA, BARRILLEAUX ([id number]) Assessment Active Problems ICD-10 Type 2 diabetes mellitus with other skin ulcer Disruption of external operation (surgical) wound, not elsewhere classified, initial encounter Non-pressure chronic ulcer of other part of right lower leg with fat layer exposed Type 2 diabetes mellitus with diabetic autonomic (poly)neuropathy Essential (primary) hypertension Plan Follow-up Appointments: Return Appointment in 1 week. Nurse Visit as needed - thursday Bathing/ Shower/ Hygiene: No tub bath. Negative Pressure Wound Therapy: Wound VAC settings at 175mmHg continuous pressure. Use foam to wound cavity. Please order WHITE foam to fill any tunnel/s and/or undermining when necessary. Change VAC dressing 2 X WEEK. Change canister as indicated when full. Number of foam/gauze pieces used in the dressing = - 3 Other: - wet to dry if wound vac malfunctions WOUND  #1: - Amputation Site - Below Knee Wound Laterality: Right Primary Dressing: Prisma 4.34 (in) Discharge Instructions: Moisten w/normal saline or sterile water; Cover wound as directed. Do not remove from wound bed. 1. I am going to suggest that we going continue with the wound care measures as before and the patient is in agreement with plan. This includes the use of the silver collagen which I do believe is doing a good job. Which we again to be adding to the base of the wound applying the wound VAC over top. 2. I am also can recommend that we have the patient continue to monitor for any signs of worsening or infection. If anything changes he should let me know as soon as possible as well. We will see patient back for reevaluation in 1 week here in the clinic. If anything worsens or changes patient will contact our office for additional recommendations. Electronic Signature(s) Signed: 02/20/2022 9:02:35 AM By: Allison Keeler PA-C Previous Signature: 02/20/2022 9:01:43 AM Version By: Allison Keeler PA-C Entered By: [name] on 02/20/2022 09:02:35 [name] ([id number]) -------------------------------------------------------------------------------- SuperBill Details Patient Name: [name] Date of Service: 02/20/2022 Medical Record Number: [id number] Patient Account Number: [id number] Date of Birth/Sex: [birth year] (35 y.o. F) Treating RN: [name] Primary Care Provider: Jac Howell Other Clinician: Referring Provider: Jac Howell Treating Provider/Extender: [name] in Treatment: 3 Diagnosis Coding ICD-10 Codes Code Description E11.622 Type 2 diabetes mellitus with other skin ulcer T81.31XA Disruption of external operation (surgical) wound, not elsewhere classified, initial encounter L97.812 Non-pressure chronic ulcer of other part of right lower leg with fat layer exposed E11.43 Type 2 diabetes mellitus with diabetic autonomic (poly)neuropathy I10  Essential (primary) hypertension Facility Procedures CPT4 Code: [id number] Description: 97606 - WOUND VAC-GREATER TH 50 SQ CM Modifier: Quantity: 1 Physician Procedures CPT4 Code: [id number] Description: 99214 - WC PHYS LEVEL 4 - EST PT Modifier: Quantity: 1 CPT4 Code: Description: ICD-10 Diagnosis Description E11.622 Type 2 diabetes mellitus with other skin ulcer T81.31XA Disruption of external operation (surgical) wound, not elsewhere classifi L97.812 Non-pressure chronic ulcer of other part of right lower leg with  fat laye E11.43 Type 2 diabetes mellitus with diabetic autonomic (poly)neuropathy Modifier: ed, initial encounter r  exposed Quantity: Electronic Signature(s) Signed: 02/20/2022 9:03:03 AM By: Allison Keeler PA-C Entered By: [name] on 02/20/2022 09:03:03

## 2022-02-22 DIAGNOSIS — E11622 Type 2 diabetes mellitus with other skin ulcer: Secondary | ICD-10-CM | POA: Diagnosis not present

## 2022-02-23 ENCOUNTER — Other Ambulatory Visit: Payer: Self-pay | Admitting: Nurse Practitioner

## 2022-02-23 ENCOUNTER — Ambulatory Visit: Payer: Medicaid Other

## 2022-02-23 DIAGNOSIS — E1165 Type 2 diabetes mellitus with hyperglycemia: Secondary | ICD-10-CM

## 2022-02-23 NOTE — Progress Notes (Signed)
ADLEY, CASTELLO ([id number]) Visit Report for 02/22/2022 Physician Orders Details Patient Name: [name], [name] Date of Service: 02/22/2022 3:10 PM Medical Record Number: [id number] Patient Account Number: [id number] Date of Birth/Sex: Dec 18, 1985 (36 y.o. F) Treating RN: Yevonne Pax Primary Care Provider: Lynnell Chad Other Clinician: Referring Provider: Lynnell Chad Treating Provider/Extender: Tilda Franco in Treatment: 3 Verbal / Phone Orders: No Diagnosis Coding Follow-up Appointments o Return Appointment in 1 week. o Nurse Visit as needed - thursday Bathing/ Shower/ Hygiene o No tub bath. Negative Pressure Wound Therapy o Wound VAC settings at continuous pressure. Use foam to wound cavity. Please order WHITE foam to fill any tunnel/s and/or undermining when necessary. Change VAC dressing 2 X WEEK. Change canister as indicated when full. o Number of foam/gauze pieces used in the dressing = - 3 o Other: - wet to dry if wound vac malfunctions Wound Treatment Wound #1 - Amputation Site - Below Knee Wound Laterality: Right Primary Dressing: Prisma 4.34 (in) Discharge Instructions: Moisten w/normal saline or sterile water; Cover wound as directed. Do not remove from wound bed. Electronic Signature(s) Signed: 02/23/2022 7:58:17 AM By: Yevonne Pax RN Signed: 02/23/2022 4:52:14 PM By: Geralyn Corwin DO Entered By: Yevonne Pax on 02/23/2022 07:58:16 [name] ([id number]) -------------------------------------------------------------------------------- SuperBill Details Patient Name: [name] Date of Service: 02/22/2022 Medical Record Number: [id number] Patient Account Number: [id number] Date of Birth/Sex: [birth year] (35 y.o. F) Treating RN: Yevonne Pax Primary Care Provider: Lynnell Chad Other Clinician: Referring Provider: Lynnell Chad Treating Provider/Extender: Tilda Franco in Treatment: 3 Diagnosis Coding ICD-10  Codes Code Description E11.622 Type 2 diabetes mellitus with other skin ulcer T81.31XA Disruption of external operation (surgical) wound, not elsewhere classified, initial encounter L97.812 Non-pressure chronic ulcer of other part of right lower leg with fat layer exposed E11.43 Type 2 diabetes mellitus with diabetic autonomic (poly)neuropathy I10 Essential (primary) hypertension Facility Procedures CPT4 Code: [id number] Description: 97606 - WOUND VAC-GREATER TH 50 SQ CM Modifier: Quantity: 1 Electronic Signature(s) Signed: 02/23/2022 7:58:50 AM By: Yevonne Pax RN Signed: 02/23/2022 4:52:14 PM By: Geralyn Corwin DO Entered By: Yevonne Pax on 02/23/2022 07:58:50

## 2022-02-23 NOTE — Progress Notes (Signed)
Allison, Howell ([id number]) Visit Report for 02/22/2022 Arrival Information Details Patient Name: [name], [name] Date of Service: 02/22/2022 3:10 PM Medical Record Number: [id number] Patient Account Number: [id number] Date of Birth/Sex: [birth year] (36 y.o. F) Treating RN: Yevonne Pax Primary Care Cheston Coury: Lynnell Chad Other Clinician: Referring Parrish Bonn: Lynnell Chad Treating Zaila Crew/Extender: Tilda Franco in Treatment: 3 Visit Information History Since Last Visit All ordered tests and consults were completed: No Patient Arrived: Ambulatory Added or deleted any medications: No Arrival Time: 14:55 Any new allergies or adverse reactions: No Accompanied By: self Had a fall or experienced change in No Transfer Assistance: None activities of daily living that may affect Patient Identification Verified: Yes risk of falls: Secondary Verification Process Completed: Yes Signs or symptoms of abuse/neglect since last visito No Patient Requires Transmission-Based Precautions: No Hospitalized since last visit: No Patient Has Alerts: Yes Implantable device outside of the clinic excluding No Patient Alerts: DIABETIC cellular tissue based products placed in the center since last visit: Has Dressing in Place as Prescribed: Yes Pain Present Now: No Electronic Signature(s) Signed: 02/23/2022 7:54:14 AM By: Yevonne Pax RN Entered By: Yevonne Pax on 02/23/2022 07:54:14 [name] ([id number]) -------------------------------------------------------------------------------- Clinic Level of Care Assessment Details Patient Name: [name] Date of Service: 02/22/2022 3:10 PM Medical Record Number: [id number] Patient Account Number: [id number] Date of Birth/Sex: 1986/09/13 (35 y.o. F) Treating RN: Yevonne Pax Primary Care Quantisha Marsicano: Lynnell Chad Other Clinician: Referring Mishawn Didion: Lynnell Chad Treating Naseem Adler/Extender: Tilda Franco in Treatment: 3 Clinic  Level of Care Assessment Items TOOL 1 Quantity Score []  - Use when EandM and Procedure is performed on INITIAL visit 0 ASSESSMENTS - Nursing Assessment / Reassessment []  - General Physical Exam (combine w/ comprehensive assessment (listed just below) when performed on new 0 pt. evals) []  - 0 Comprehensive Assessment (HX, ROS, Risk Assessments, Wounds Hx, etc.) ASSESSMENTS - Wound and Skin Assessment / Reassessment []  - Dermatologic / Skin Assessment (not related to wound area) 0 ASSESSMENTS - Ostomy and/or Continence Assessment and Care []  - Incontinence Assessment and Management 0 []  - 0 Ostomy Care Assessment and Management (repouching, etc.) PROCESS - Coordination of Care []  - Simple Patient / Family Education for ongoing care 0 []  - 0 Complex (extensive) Patient / Family Education for ongoing care []  - 0 Staff obtains , Records, Test Results / Process Orders []  - 0 Staff telephones HHA, Nursing Homes / Clarify orders / etc []  - 0 Routine Transfer to another Facility (non-emergent condition) []  - 0 Routine Hospital Admission (non-emergent condition) []  - 0 New Admissions / / Ordering NPWT, Apligraf, etc. []  - 0 Emergency Hospital Admission (emergent condition) PROCESS - Special Needs []  - Pediatric / Minor Patient Management 0 []  - 0 Isolation Patient Management []  - 0 Hearing / Language / Visual special needs []  - 0 Assessment of Community assistance (transportation, D/C planning, etc.) []  - 0 Additional assistance / Altered mentation []  - 0 Support Surface(s) Assessment (bed, cushion, seat, etc.) INTERVENTIONS - Miscellaneous []  - External ear exam 0 []  - 0 Patient Transfer (multiple staff / / Similar devices) []  - 0 Simple Staple / Suture removal (25 or less) []  - 0 Complex Staple / Suture removal (26 or more) []  - 0 Hypo/Hyperglycemic Management (do not check if billed separately) []  - 0 Ankle / Brachial Index  (ABI) - do not check if billed separately Has the patient been seen at the hospital within the last three years: Yes Total Score: 0 Level Of  Care: ____ [name] ([id number]) Electronic Signature(s) Unsigned Entered By: Yevonne Pax on 02/23/2022 07:58:42 Signature(s): Date(s): [name] ([id number]) -------------------------------------------------------------------------------- Encounter Discharge Information Details Patient Name: [name] Date of Service: 02/22/2022 3:10 PM Medical Record Number: [id number] Patient Account Number: [id number] Date of Birth/Sex: [birth year] (36 y.o. F) Treating RN: Yevonne Pax Primary Care Earle Troiano: Lynnell Chad Other Clinician: Referring Bentlie Catanzaro: Lynnell Chad Treating Elston Aldape/Extender: Tilda Franco in Treatment: 3 Encounter Discharge Information Items Discharge Condition: Stable Ambulatory Status: Wheelchair Discharge Destination: Home Transportation: Private Auto Accompanied By: self Schedule Follow-up Appointment: Yes Clinical Summary of Care: Electronic Signature(s) Signed: 02/23/2022 7:58:37 AM By: Yevonne Pax RN Entered By: Yevonne Pax on 02/23/2022 07:58:37 [name] ([id number]) -------------------------------------------------------------------------------- Negative Pressure Wound Therapy Maintenance (NPWT) Details Patient Name: Allison, Howell Date of Service: 02/22/2022 3:10 PM Medical Record Number: [id number] Patient Account Number: [id number] Date of Birth/Sex: 1986-02-27 (36 y.o. F) Treating RN: Yevonne Pax Primary Care Satvik Parco: Lynnell Chad Other Clinician: Referring Torrez Renfroe: Lynnell Chad Treating Marea Reasner/Extender: Tilda Franco in Treatment: 3 NPWT Maintenance Performed for: Wound #1 Right Amputation Site - Below Knee Performed By: Yevonne Pax, RN Type: Other Coverage Size (sq cm): 51 Pressure Type: Constant Pressure Setting: 125 mmHG Drain Type: None Sponge/Dressing  Type: Foam, Black Date Initiated: 01/23/2022 Dressing Removed: No Quantity of Sponges/Gauze Removed: 3 Canister Changed: No Dressing Reapplied: No Quantity of Sponges/Gauze Inserted: 3 Days On NPWT: 31 Electronic Signature(s) Signed: 02/23/2022 7:55:39 AM By: Yevonne Pax RN Entered By: Yevonne Pax on 02/23/2022 07:55:38 [name] ([id number]) -------------------------------------------------------------------------------- Wound Assessment Details Patient Name: [name] Date of Service: 02/22/2022 3:10 PM Medical Record Number: [id number] Patient Account Number: [id number] Date of Birth/Sex: 26-Feb-1986 (36 y.o. F) Treating RN: Yevonne Pax Primary Care Mylah Baynes: Lynnell Chad Other Clinician: Referring Luisdavid Hamblin: Lynnell Chad Treating Envy Meno/Extender: Tilda Franco in Treatment: 3 Wound Status Wound Number: 1 Primary Etiology: Abscess Wound Location: Right Amputation Site - Below Knee Wound Status: Open Wounding Event: Gradually Appeared Comorbid History: Hypertension, Type II Diabetes Date Acquired: 01/14/2022 Weeks Of Treatment: 3 Clustered Wound: No Wound Measurements Length: (cm) 6 Width: (cm) 8.5 Depth: (cm) 3.4 Area: (cm) 40.055 Volume: (cm) 136.188 % Reduction in Area: -48.3% % Reduction in Volume: -29.2% Epithelialization: None Tunneling: No Undermining: No Wound Description Classification: Full Thickness With Exposed Support Structures Exudate Amount: Medium Exudate Type: Serosanguineous Exudate Color: red, brown Foul Odor After Cleansing: No Slough/Fibrino Yes Wound Bed Granulation Amount: Large (67-100%) Exposed Structure Granulation Quality: Red, Pink Fascia Exposed: No Necrotic Amount: Small (1-33%) Fat Layer (Subcutaneous Tissue) Exposed: Yes Tendon Exposed: No Muscle Exposed: Yes Necrosis of Muscle: No Joint Exposed: No Bone Exposed: No Treatment Notes Wound #1 (Amputation Site - Below Knee) Wound Laterality:  Right Cleanser Peri-Wound Care Topical Primary Dressing Prisma 4.34 (in) Discharge Instruction: Moisten w/normal saline or sterile water; Cover wound as directed. Do not remove from wound bed. Secondary Dressing Secured With Compression Wrap Compression Stockings Add-Ons Electronic Signature(s) Signed: 02/23/2022 7:54:48 AM By: Yevonne Pax RN Stevan Born, Daleen ([id number]) Entered By: Yevonne Pax on 02/23/2022 07:54:48

## 2022-02-24 DIAGNOSIS — E11622 Type 2 diabetes mellitus with other skin ulcer: Secondary | ICD-10-CM | POA: Diagnosis not present

## 2022-02-24 NOTE — Progress Notes (Signed)
KIYANNA, BIEGLER ([id number]) Visit Report for 02/24/2022 Arrival Information Details Patient Name: [name], [name] Date of Service: 02/24/2022 2:00 PM Medical Record Number: [id number] Patient Account Number: [id number] Date of Birth/Sex: 13-Jun-1986 (36 y.o. F) Treating RN: Yevonne Pax Primary Care Hadessah Grennan: Lynnell Chad Other Clinician: Referring Steele Ledonne: Lynnell Chad Treating Jenayah Antu/Extender: Rowan Blase in Treatment: 4 Visit Information History Since Last Visit All ordered tests and consults were completed: No Patient Arrived: Ambulatory Added or deleted any medications: No Arrival Time: 14:00 Any new allergies or adverse reactions: No Accompanied By: self Had a fall or experienced change in No Transfer Assistance: None activities of daily living that may affect Patient Identification Verified: Yes risk of falls: Secondary Verification Process Completed: Yes Signs or symptoms of abuse/neglect since last visito No Patient Requires Transmission-Based Precautions: No Hospitalized since last visit: No Patient Has Alerts: Yes Implantable device outside of the clinic excluding No Patient Alerts: DIABETIC cellular tissue based products placed in the center since last visit: Has Dressing in Place as Prescribed: Yes Pain Present Now: No Electronic Signature(s) Signed: 02/24/2022 4:32:07 PM By: Yevonne Pax RN Entered By: Yevonne Pax on 02/24/2022 16:32:07 [name] ([id number]) -------------------------------------------------------------------------------- Clinic Level of Care Assessment Details Patient Name: [name] Date of Service: 02/24/2022 2:00 PM Medical Record Number: [id number] Patient Account Number: [id number] Date of Birth/Sex: 06/15/1986 (35 y.o. F) Treating RN: Yevonne Pax Primary Care Kairy Folsom: Lynnell Chad Other Clinician: Referring Mehtaab Mayeda: Lynnell Chad Treating Malvika Tung/Extender: Rowan Blase in Treatment: 4 Clinic Level of  Care Assessment Items TOOL 1 Quantity Score []  - Use when EandM and Procedure is performed on INITIAL visit 0 ASSESSMENTS - Nursing Assessment / Reassessment []  - General Physical Exam (combine w/ comprehensive assessment (listed just below) when performed on new 0 pt. evals) []  - 0 Comprehensive Assessment (HX, ROS, Risk Assessments, Wounds Hx, etc.) ASSESSMENTS - Wound and Skin Assessment / Reassessment []  - Dermatologic / Skin Assessment (not related to wound area) 0 ASSESSMENTS - Ostomy and/or Continence Assessment and Care []  - Incontinence Assessment and Management 0 []  - 0 Ostomy Care Assessment and Management (repouching, etc.) PROCESS - Coordination of Care []  - Simple Patient / Family Education for ongoing care 0 []  - 0 Complex (extensive) Patient / Family Education for ongoing care []  - 0 Staff obtains , Records, Test Results / Process Orders []  - 0 Staff telephones HHA, Nursing Homes / Clarify orders / etc []  - 0 Routine Transfer to another Facility (non-emergent condition) []  - 0 Routine Hospital Admission (non-emergent condition) []  - 0 New Admissions / / Ordering NPWT, Apligraf, etc. []  - 0 Emergency Hospital Admission (emergent condition) PROCESS - Special Needs []  - Pediatric / Minor Patient Management 0 []  - 0 Isolation Patient Management []  - 0 Hearing / Language / Visual special needs []  - 0 Assessment of Community assistance (transportation, D/C planning, etc.) []  - 0 Additional assistance / Altered mentation []  - 0 Support Surface(s) Assessment (bed, cushion, seat, etc.) INTERVENTIONS - Miscellaneous []  - External ear exam 0 []  - 0 Patient Transfer (multiple staff / / Similar devices) []  - 0 Simple Staple / Suture removal (25 or less) []  - 0 Complex Staple / Suture removal (26 or more) []  - 0 Hypo/Hyperglycemic Management (do not check if billed separately) []  - 0 Ankle / Brachial Index (ABI) - do  not check if billed separately Has the patient been seen at the hospital within the last three years: Yes Total Score: 0 Level Of  Care: ____ [name] ([id number]) Electronic Signature(s) Signed: 02/24/2022 4:46:53 PM By: Yevonne Pax RN Entered By: Yevonne Pax on 02/24/2022 16:34:28 [name] ([id number]) -------------------------------------------------------------------------------- Encounter Discharge Information Details Patient Name: [name] Date of Service: 02/24/2022 2:00 PM Medical Record Number: [id number] Patient Account Number: [id number] Date of Birth/Sex: 1986-05-17 (35 y.o. F) Treating RN: Yevonne Pax Primary Care Heyward Douthit: Lynnell Chad Other Clinician: Referring Joren Rehm: Lynnell Chad Treating Sheletha Bow/Extender: Rowan Blase in Treatment: 4 Encounter Discharge Information Items Discharge Condition: Stable Ambulatory Status: Wheelchair Discharge Destination: Home Transportation: Private Auto Accompanied By: self Schedule Follow-up Appointment: Yes Clinical Summary of Care: Patient Declined Electronic Signature(s) Signed: 02/24/2022 4:34:21 PM By: Yevonne Pax RN Entered By: Yevonne Pax on 02/24/2022 16:34:21 [name] ([id number]) -------------------------------------------------------------------------------- Negative Pressure Wound Therapy Maintenance (NPWT) Details Patient Name: Allison, Howell Date of Service: 02/24/2022 2:00 PM Medical Record Number: [id number] Patient Account Number: [id number] Date of Birth/Sex: [birth year] (35 y.o. F) Treating RN: Yevonne Pax Primary Care Vanesa Renier: Lynnell Chad Other Clinician: Referring Fatmata Legere: Lynnell Chad Treating Rosette Bellavance/Extender: Rowan Blase in Treatment: 4 NPWT Maintenance Performed for: Wound #1 Right Amputation Site - Below Knee Performed By: Yevonne Pax, RN Type: Other Coverage Size (sq cm): 51 Pressure Type: Constant Pressure Setting: 125 mmHG Drain Type:  None Sponge/Dressing Type: Foam, Black Date Initiated: 01/23/2022 Dressing Removed: No Quantity of Sponges/Gauze Removed: 3 Canister Changed: No Dressing Reapplied: No Quantity of Sponges/Gauze Inserted: 3 Days On NPWT: 33 Electronic Signature(s) Signed: 02/24/2022 4:33:35 PM By: Yevonne Pax RN Entered By: Yevonne Pax on 02/24/2022 16:33:35 [name] ([id number]) -------------------------------------------------------------------------------- Wound Assessment Details Patient Name: [name] Date of Service: 02/24/2022 2:00 PM Medical Record Number: [id number] Patient Account Number: [id number] Date of Birth/Sex: [birth year] (35 y.o. F) Treating RN: Yevonne Pax Primary Care Deveion Denz: Lynnell Chad Other Clinician: Referring Phong Isenberg: Lynnell Chad Treating Candi Profit/Extender: Rowan Blase in Treatment: 4 Wound Status Wound Number: 1 Primary Etiology: Abscess Wound Location: Right Amputation Site - Below Knee Wound Status: Open Wounding Event: Gradually Appeared Comorbid History: Hypertension, Type II Diabetes Date Acquired: 01/14/2022 Weeks Of Treatment: 4 Clustered Wound: No Wound Measurements Length: (cm) 6 Width: (cm) 8.5 Depth: (cm) 3.4 Area: (cm) 40.055 Volume: (cm) 136.188 % Reduction in Area: -48.3% % Reduction in Volume: -29.2% Epithelialization: None Tunneling: No Undermining: No Wound Description Classification: Full Thickness With Exposed Support Structures Exudate Amount: Medium Exudate Type: Serosanguineous Exudate Color: red, brown Foul Odor After Cleansing: No Slough/Fibrino Yes Wound Bed Granulation Amount: Large (67-100%) Exposed Structure Granulation Quality: Red, Pink Fascia Exposed: No Necrotic Amount: Small (1-33%) Fat Layer (Subcutaneous Tissue) Exposed: Yes Tendon Exposed: No Muscle Exposed: Yes Necrosis of Muscle: No Joint Exposed: No Bone Exposed: No Treatment Notes Wound #1 (Amputation Site - Below Knee) Wound  Laterality: Right Cleanser Peri-Wound Care Topical Primary Dressing Prisma 4.34 (in) Discharge Instruction: Moisten w/normal saline or sterile water; Cover wound as directed. Do not remove from wound bed. Secondary Dressing Secured With Compression Wrap Compression Stockings Add-Ons Electronic Signature(s) Signed: 02/24/2022 4:32:35 PM By: Yevonne Pax RN Stevan Born, Kaelin ([id number]) Entered By: Yevonne Pax on 02/24/2022 16:32:34

## 2022-02-24 NOTE — Progress Notes (Signed)
YARIAH, SELVEY ([id number]) Visit Report for 02/24/2022 Physician Orders Details Patient Name: [name], [name] Date of Service: 02/24/2022 2:00 PM Medical Record Number: [id number] Patient Account Number: [id number] Date of Birth/Sex: Sep 04, 1986 (36 y.o. F) Treating RN: Yevonne Pax Primary Care Provider: Lynnell Chad Other Clinician: Referring Provider: Lynnell Chad Treating Provider/Extender: Rowan Blase in Treatment: 4 Verbal / Phone Orders: No Diagnosis Coding Follow-up Appointments o Return Appointment in 1 week. o Nurse Visit as needed - thursday Bathing/ Shower/ Hygiene o No tub bath. Negative Pressure Wound Therapy o Wound VAC settings at continuous pressure. Use foam to wound cavity. Please order WHITE foam to fill any tunnel/s and/or undermining when necessary. Change VAC dressing 2 X WEEK. Change canister as indicated when full. o Number of foam/gauze pieces used in the dressing = - 3 o Other: - wet to dry if wound vac malfunctions Wound Treatment Wound #1 - Amputation Site - Below Knee Wound Laterality: Right Primary Dressing: Prisma 4.34 (in) Discharge Instructions: Moisten w/normal saline or sterile water; Cover wound as directed. Do not remove from wound bed. Electronic Signature(s) Signed: 02/24/2022 4:33:57 PM By: Yevonne Pax RN Signed: 02/24/2022 4:54:58 PM By: Lenda Kelp PA-C Entered By: Yevonne Pax on 02/24/2022 16:33:57 [name] ([id number]) -------------------------------------------------------------------------------- SuperBill Details Patient Name: [name] Date of Service: 02/24/2022 Medical Record Number: [id number] Patient Account Number: [id number] Date of Birth/Sex: 30-Mar-1986 (35 y.o. F) Treating RN: Yevonne Pax Primary Care Provider: Lynnell Chad Other Clinician: Referring Provider: Lynnell Chad Treating Provider/Extender: Rowan Blase in Treatment: 4 Diagnosis Coding ICD-10 Codes Code  Description E11.622 Type 2 diabetes mellitus with other skin ulcer T81.31XA Disruption of external operation (surgical) wound, not elsewhere classified, initial encounter L97.812 Non-pressure chronic ulcer of other part of right lower leg with fat layer exposed E11.43 Type 2 diabetes mellitus with diabetic autonomic (poly)neuropathy I10 Essential (primary) hypertension Facility Procedures CPT4 Code: 88416606 Description: 97606 - WOUND VAC-GREATER TH 50 SQ CM Modifier: Quantity: 1 Electronic Signature(s) Signed: 02/24/2022 4:34:35 PM By: Yevonne Pax RN Signed: 02/24/2022 4:54:58 PM By: Lenda Kelp PA-C Entered By: Yevonne Pax on 02/24/2022 16:34:34

## 2022-02-27 ENCOUNTER — Other Ambulatory Visit: Payer: Self-pay | Admitting: Nurse Practitioner

## 2022-02-28 DIAGNOSIS — E11622 Type 2 diabetes mellitus with other skin ulcer: Secondary | ICD-10-CM | POA: Diagnosis not present

## 2022-02-28 NOTE — Progress Notes (Signed)
Allison, Howell ([id number]) Visit Report for 02/28/2022 Arrival Information Details Patient Name: [name], [name] Date of Service: 02/28/2022 8:00 AM Medical Record Number: [id number] Patient Account Number: [id number] Date of Birth/Sex: [birth year] (36 y.o. F) Treating RN: Angelina Pih Primary Care Shahid Flori: Lynnell Chad Other Clinician: Referring Timotheus Salm: Lynnell Chad Treating Dalaya Suppa/Extender: Rowan Blase in Treatment: 4 Visit Information History Since Last Visit Added or deleted any medications: No Patient Arrived: Wheel Chair Any new allergies or adverse reactions: No Arrival Time: 07:58 Had a fall or experienced change in No Accompanied By: self activities of daily living that may affect Transfer Assistance: Manual risk of falls: Patient Requires Transmission-Based Precautions: No Hospitalized since last visit: No Patient Has Alerts: Yes Has Dressing in Place as Prescribed: Yes Patient Alerts: DIABETIC Pain Present Now: No Electronic Signature(s) Signed: 02/28/2022 4:32:40 PM By: Angelina Pih Entered By: Angelina Pih on 02/28/2022 08:01:30 [name] ([id number]) -------------------------------------------------------------------------------- Clinic Level of Care Assessment Details Patient Name: [name] Date of Service: 02/28/2022 8:00 AM Medical Record Number: [id number] Patient Account Number: [id number] Date of Birth/Sex: 09-May-1986 (35 y.o. F) Treating RN: Angelina Pih Primary Care Jedadiah Abdallah: Lynnell Chad Other Clinician: Referring Christin Mccreedy: Lynnell Chad Treating Evi Mccomb/Extender: Rowan Blase in Treatment: 4 Clinic Level of Care Assessment Items TOOL 1 Quantity Score []  - Use when EandM and Procedure is performed on INITIAL visit 0 ASSESSMENTS - Nursing Assessment / Reassessment []  - General Physical Exam (combine w/ comprehensive assessment (listed just below) when performed on new 0 pt. evals) []  - 0 Comprehensive  Assessment (HX, ROS, Risk Assessments, Wounds Hx, etc.) ASSESSMENTS - Wound and Skin Assessment / Reassessment []  - Dermatologic / Skin Assessment (not related to wound area) 0 ASSESSMENTS - Ostomy and/or Continence Assessment and Care []  - Incontinence Assessment and Management 0 []  - 0 Ostomy Care Assessment and Management (repouching, etc.) PROCESS - Coordination of Care []  - Simple Patient / Family Education for ongoing care 0 []  - 0 Complex (extensive) Patient / Family Education for ongoing care []  - 0 Staff obtains , Records, Test Results / Process Orders []  - 0 Staff telephones HHA, Nursing Homes / Clarify orders / etc []  - 0 Routine Transfer to another Facility (non-emergent condition) []  - 0 Routine Hospital Admission (non-emergent condition) []  - 0 New Admissions / / Ordering NPWT, Apligraf, etc. []  - 0 Emergency Hospital Admission (emergent condition) PROCESS - Special Needs []  - Pediatric / Minor Patient Management 0 []  - 0 Isolation Patient Management []  - 0 Hearing / Language / Visual special needs []  - 0 Assessment of Community assistance (transportation, D/C planning, etc.) []  - 0 Additional assistance / Altered mentation []  - 0 Support Surface(s) Assessment (bed, cushion, seat, etc.) INTERVENTIONS - Miscellaneous []  - External ear exam 0 []  - 0 Patient Transfer (multiple staff / / Similar devices) []  - 0 Simple Staple / Suture removal (25 or less) []  - 0 Complex Staple / Suture removal (26 or more) []  - 0 Hypo/Hyperglycemic Management (do not check if billed separately) []  - 0 Ankle / Brachial Index (ABI) - do not check if billed separately Has the patient been seen at the hospital within the last three years: Yes Total Score: 0 Level Of Care: ____ ( ) Electronic Signature(s) Signed: 02/28/2022 4:32:40 PM By: Entered By: on 02/28/2022  16:27:33 Chiropractor ( ) -------------------------------------------------------------------------------- Encounter Discharge Information Details Patient Name: Date of Service: 02/28/2022 8:00 AM Medical Record Number: Patient Account  Number: [id number] Date of Birth/Sex: 1986/06/30 (35 y.o. F) Treating RN: Angelina Pih Primary Care Randee Huston: Lynnell Chad Other Clinician: Referring Tanja Gift: Lynnell Chad Treating Kennedy Bohanon/Extender: Rowan Blase in Treatment: 4 Encounter Discharge Information Items Discharge Condition: Stable Ambulatory Status: Wheelchair Discharge Destination: Home Transportation: Private Auto Accompanied By: self Schedule Follow-up Appointment: Yes Clinical Summary of Care: Electronic Signature(s) Signed: 02/28/2022 4:27:13 PM By: Angelina Pih Entered By: Angelina Pih on 02/28/2022 16:27:13 [name] ([id number]) -------------------------------------------------------------------------------- Negative Pressure Wound Therapy Maintenance (NPWT) Details Patient Name: Allison, Howell Date of Service: 02/28/2022 8:00 AM Medical Record Number: [id number] Patient Account Number: [id number] Date of Birth/Sex: 16-Jul-1986 (35 y.o. F) Treating RN: Angelina Pih Primary Care Adger Cantera: Lynnell Chad Other Clinician: Referring Danielys Madry: Lynnell Chad Treating Nayra Coury/Extender: Rowan Blase in Treatment: 4 NPWT Maintenance Performed for: Wound #1 Right Amputation Site - Below Knee Performed By: Angelina Pih, RN Type: Other Coverage Size (sq cm): 51 Pressure Type: Constant Pressure Setting: 125 mmHG Drain Type: None Sponge/Dressing Type: Foam, Black Date Initiated: 01/23/2022 Dressing Removed: No Quantity of Sponges/Gauze Removed: 2 Canister Changed: Yes Dressing Reapplied: Yes Quantity of Sponges/Gauze Inserted: 3 Days On NPWT: 37 Notes patient tolerated wound vac change Electronic  Signature(s) Signed: 02/28/2022 4:25:05 PM By: Angelina Pih Entered By: Angelina Pih on 02/28/2022 16:25:05 [name] ([id number]) -------------------------------------------------------------------------------- Wound Assessment Details Patient Name: [name] Date of Service: 02/28/2022 8:00 AM Medical Record Number: [id number] Patient Account Number: [id number] Date of Birth/Sex: [birth year] (36 y.o. F) Treating RN: Angelina Pih Primary Care Maecy Podgurski: Lynnell Chad Other Clinician: Referring Thorn Demas: Lynnell Chad Treating Tahnee Cifuentes/Extender: Rowan Blase in Treatment: 4 Wound Status Wound Number: 1 Primary Etiology: Abscess Wound Location: Right Amputation Site - Below Knee Wound Status: Open Wounding Event: Gradually Appeared Comorbid History: Hypertension, Type II Diabetes Date Acquired: 01/14/2022 Weeks Of Treatment: 4 Clustered Wound: No Wound Measurements Length: (cm) 6 Width: (cm) 8.5 Depth: (cm) 3.4 Area: (cm) 40.055 Volume: (cm) 136.188 % Reduction in Area: -48.3% % Reduction in Volume: -29.2% Epithelialization: None Tunneling: No Undermining: No Wound Description Classification: Full Thickness With Exposed Support Structures Exudate Amount: Medium Exudate Type: Serosanguineous Exudate Color: red, brown Foul Odor After Cleansing: No Slough/Fibrino Yes Wound Bed Granulation Amount: Large (67-100%) Exposed Structure Granulation Quality: Red, Pink Fascia Exposed: No Necrotic Amount: Small (1-33%) Fat Layer (Subcutaneous Tissue) Exposed: Yes Tendon Exposed: No Muscle Exposed: Yes Necrosis of Muscle: No Joint Exposed: No Bone Exposed: No Treatment Notes Wound #1 (Amputation Site - Below Knee) Wound Laterality: Right Cleanser Normal Saline Discharge Instruction: Wash your hands with soap and water. Remove old dressing, discard into plastic bag and place into trash. Cleanse the wound with Normal Saline prior to applying a clean  dressing using gauze sponges, not tissues or cotton balls. Do not scrub or use excessive force. Pat dry using gauze sponges, not tissue or cotton balls. Soap and Water Discharge Instruction: Gently cleanse wound with antibacterial soap, rinse and pat dry prior to dressing wounds Peri-Wound Care Topical Primary Dressing Prisma 4.34 (in) Discharge Instruction: Moisten w/normal saline or sterile water; Cover wound as directed. Do not remove from wound bed. Packed dry in undermining area of wound Secondary Dressing Secured With MADAI, NUCCIO ([id number]) Compression Wrap Compression Stockings Add-Ons Electronic Signature(s) Signed: 02/28/2022 4:32:40 PM By: Angelina Pih Entered By: Angelina Pih on 02/28/2022 08:03:25

## 2022-02-28 NOTE — Progress Notes (Signed)
LOUIS, LICONA ([id number]) Visit Report for 02/28/2022 Physician Orders Details Patient Name: [name], [name] Date of Service: 02/28/2022 8:00 AM Medical Record Number: [id number] Patient Account Number: [id number] Date of Birth/Sex: [birth year] (36 y.o. F) Treating RN: Levora Dredge Primary Care Provider: Jac Canavan Other Clinician: Referring Provider: Jac Canavan Treating Provider/Extender: Skipper Cliche in Treatment: 4 Verbal / Phone Orders: No Diagnosis Coding Follow-up Appointments o Return Appointment in 1 week. o Nurse Visit as needed - thursday Bathing/ Shower/ Hygiene o No tub bath. Negative Pressure Wound Therapy o Wound VAC settings at 124mmHg continuous pressure. Use foam to wound cavity. Please order WHITE foam to fill any tunnel/s and/or undermining when necessary. Change VAC dressing 2 X WEEK. Change canister as indicated when full. o Number of foam/gauze pieces used in the dressing = - 3 o Other: - wet to dry if wound vac malfunctions Wound Treatment Wound #1 - Amputation Site - Below Knee Wound Laterality: Right Cleanser: Normal Saline 3 x Per Week/30 Days Discharge Instructions: Wash your hands with soap and water. Remove old dressing, discard into plastic bag and place into trash. Cleanse the wound with Normal Saline prior to applying a clean dressing using gauze sponges, not tissues or cotton balls. Do not scrub or use excessive force. Pat dry using gauze sponges, not tissue or cotton balls. Cleanser: Soap and Water 3 x Per Week/30 Days Discharge Instructions: Gently cleanse wound with antibacterial soap, rinse and pat dry prior to dressing wounds Primary Dressing: Prisma 4.34 (in) 3 x Per Week/30 Days Discharge Instructions: Moisten w/normal saline or sterile water; Cover wound as directed. Do not remove from wound bed. Packed dry in undermining area of wound Electronic Signature(s) Signed: 02/28/2022 4:26:53 PM By: Levora Dredge Signed:  02/28/2022 5:18:35 PM By: Worthy Keeler PA-C Previous Signature: 02/28/2022 4:26:32 PM Version By: Levora Dredge Previous Signature: 02/28/2022 12:09:48 PM Version By: Worthy Keeler PA-C Entered By: Levora Dredge on 02/28/2022 16:26:53 [name] ([id number]) -------------------------------------------------------------------------------- SuperBill Details Patient Name: [name] Date of Service: 02/28/2022 Medical Record Number: [id number] Patient Account Number: [id number] Date of Birth/Sex: 04-27-1986 (36 y.o. F) Treating RN: Levora Dredge Primary Care Provider: Jac Canavan Other Clinician: Referring Provider: Jac Canavan Treating Provider/Extender: Skipper Cliche in Treatment: 4 Diagnosis Coding ICD-10 Codes Code Description E11.622 Type 2 diabetes mellitus with other skin ulcer T81.31XA Disruption of external operation (surgical) wound, not elsewhere classified, initial encounter L97.812 Non-pressure chronic ulcer of other part of right lower leg with fat layer exposed E11.43 Type 2 diabetes mellitus with diabetic autonomic (poly)neuropathy I10 Essential (primary) hypertension Facility Procedures CPT4 Code: [id number] Description: [id number] - WOUND VAC-GREATER TH 50 SQ CM Modifier: Quantity: 1 CPT4 Code: Description: ICD-10 Diagnosis Description T81.31XA Disruption of external operation (surgical) wound, not elsewhere classified Modifier: , initial encounter Quantity: Physician Procedures CPT4 [id number] Description: [id number] - WC PHYS [place] WOUND VAC>50 SQ CM Modifier: Quantity: 1 CPT4 Code: Description: ICD-10 Diagnosis Description T81.31XA Disruption of external operation (surgical) wound, not elsewhere classified Modifier: , initial encounter Quantity: Electronic Signature(s) Signed: 02/28/2022 4:29:51 PM By: Levora Dredge Signed: 02/28/2022 5:18:35 PM By: Worthy Keeler PA-C Entered By: Levora Dredge on 02/28/2022 16:29:51

## 2022-03-02 ENCOUNTER — Encounter: Payer: Medicaid Other | Attending: Physician Assistant

## 2022-03-02 DIAGNOSIS — I10 Essential (primary) hypertension: Secondary | ICD-10-CM | POA: Diagnosis not present

## 2022-03-02 DIAGNOSIS — T8131XA Disruption of external operation (surgical) wound, not elsewhere classified, initial encounter: Secondary | ICD-10-CM | POA: Diagnosis not present

## 2022-03-02 DIAGNOSIS — E11622 Type 2 diabetes mellitus with other skin ulcer: Secondary | ICD-10-CM | POA: Insufficient documentation

## 2022-03-02 DIAGNOSIS — E1143 Type 2 diabetes mellitus with diabetic autonomic (poly)neuropathy: Secondary | ICD-10-CM | POA: Insufficient documentation

## 2022-03-02 DIAGNOSIS — Y835 Amputation of limb(s) as the cause of abnormal reaction of the patient, or of later complication, without mention of misadventure at the time of the procedure: Secondary | ICD-10-CM | POA: Diagnosis not present

## 2022-03-02 DIAGNOSIS — L97812 Non-pressure chronic ulcer of other part of right lower leg with fat layer exposed: Secondary | ICD-10-CM | POA: Insufficient documentation

## 2022-03-02 DIAGNOSIS — Z89511 Acquired absence of right leg below knee: Secondary | ICD-10-CM | POA: Diagnosis not present

## 2022-03-02 NOTE — Progress Notes (Signed)
KAIJAH, ABTS ([id number]) Visit Report for 03/02/2022 Physician Orders Details Patient Name: [name], [name] Date of Service: 03/02/2022 8:00 AM Medical Record Number: [id number] Patient Account Number: [id number] Date of Birth/Sex: 08-22-1986 (36 y.o. F) Treating RN: Angelina Pih Primary Care Provider: Lynnell Chad Other Clinician: Referring Provider: Lynnell Chad Treating Provider/Extender: Rowan Blase in Treatment: 4 Verbal / Phone Orders: No Diagnosis Coding Follow-up Appointments o Return Appointment in 1 week. o Nurse Visit as needed - thursday Bathing/ Shower/ Hygiene o No tub bath. Negative Pressure Wound Therapy o Wound VAC settings at continuous pressure. Use foam to wound cavity. Please order WHITE foam to fill any tunnel/s and/or undermining when necessary. Change VAC dressing 2 X WEEK. Change canister as indicated when full. o Number of foam/gauze pieces used in the dressing = - 1 piece black foam used, prisma packed dry in wound area with depth o Other: - wet to dry if wound vac malfunctions Wound Treatment Wound #1 - Amputation Site - Below Knee Wound Laterality: Right Cleanser: Normal Saline 3 x Per Week/30 Days Discharge Instructions: Wash your hands with soap and water. Remove old dressing, discard into plastic bag and place into trash. Cleanse the wound with Normal Saline prior to applying a clean dressing using gauze sponges, not tissues or cotton balls. Do not scrub or use excessive force. Pat dry using gauze sponges, not tissue or cotton balls. Cleanser: Soap and Water 3 x Per Week/30 Days Discharge Instructions: Gently cleanse wound with antibacterial soap, rinse and pat dry prior to dressing wounds Primary Dressing: Prisma 4.34 (in) 3 x Per Week/30 Days Discharge Instructions: Moisten w/normal saline or sterile water; Cover wound as directed. Do not remove from wound bed. Packed dry in undermining area of wound Electronic  Signature(s) Signed: 03/02/2022 11:01:08 AM By: Angelina Pih Signed: 03/02/2022 4:22:20 PM By: Lenda Kelp PA-C Entered By: Angelina Pih on 03/02/2022 11:01:08 [name] ([id number]) -------------------------------------------------------------------------------- SuperBill Details Patient Name: [name] Date of Service: 03/02/2022 Medical Record Number: [id number] Patient Account Number: [id number] Date of Birth/Sex: 04/10/1986 (35 y.o. F) Treating RN: Angelina Pih Primary Care Provider: Lynnell Chad Other Clinician: Referring Provider: Lynnell Chad Treating Provider/Extender: Rowan Blase in Treatment: 4 Diagnosis Coding ICD-10 Codes Code Description E11.622 Type 2 diabetes mellitus with other skin ulcer T81.31XA Disruption of external operation (surgical) wound, not elsewhere classified, initial encounter L97.812 Non-pressure chronic ulcer of other part of right lower leg with fat layer exposed E11.43 Type 2 diabetes mellitus with diabetic autonomic (poly)neuropathy I10 Essential (primary) hypertension Facility Procedures CPT4 Code: [id number] Description: 97606 - WOUND VAC-GREATER TH 50 SQ CM Modifier: Quantity: 1 CPT4 Code: Description: ICD-10 Diagnosis Description T81.31XA Disruption of external operation (surgical) wound, not elsewhere classified E11.622 Type 2 diabetes mellitus with other skin ulcer Modifier: , initial encounter Quantity: Physician Procedures CPT4 Code: [id number] Description: 97606 - WC PHYS [place] WOUND VAC>50 SQ CM Modifier: Quantity: 1 CPT4 Code: Description: ICD-10 Diagnosis Description T81.31XA Disruption of external operation (surgical) wound, not elsewhere classified E11.622 Type 2 diabetes mellitus with other skin ulcer Modifier: , initial encounter Quantity: Electronic Signature(s) Signed: 03/02/2022 11:03:04 AM By: Angelina Pih Signed: 03/02/2022 4:22:20 PM By: Lenda Kelp PA-C Entered By: Angelina Pih on 03/02/2022  11:03:03

## 2022-03-02 NOTE — Progress Notes (Signed)
LAROSE, BATRES ([id number]) Visit Report for 03/02/2022 Arrival Information Details Patient Name: [name], [name] Date of Service: 03/02/2022 8:00 AM Medical Record Number: [id number] Patient Account Number: [id number] Date of Birth/Sex: 09/19/1986 (36 y.o. F) Treating RN: Angelina Pih Primary Care Asriel Westrup: Lynnell Chad Other Clinician: Referring Dell Briner: Lynnell Chad Treating Imanii Gosdin/Extender: Rowan Blase in Treatment: 4 Visit Information History Since Last Visit Added or deleted any medications: No Patient Arrived: Wheel Chair Any new allergies or adverse reactions: No Arrival Time: 08:05 Had a fall or experienced change in No Accompanied By: self activities of daily living that may affect Transfer Assistance: None risk of falls: Patient Identification Verified: Yes Hospitalized since last visit: No Secondary Verification Process Completed: Yes Has Dressing in Place as Prescribed: Yes Patient Requires Transmission-Based Precautions: No Pain Present Now: No Patient Has Alerts: Yes Patient Alerts: DIABETIC Electronic Signature(s) Signed: 03/02/2022 3:50:43 PM By: Angelina Pih Entered By: Angelina Pih on 03/02/2022 08:07:03 [name] ([id number]) -------------------------------------------------------------------------------- Clinic Level of Care Assessment Details Patient Name: [name] Date of Service: 03/02/2022 8:00 AM Medical Record Number: [id number] Patient Account Number: [id number] Date of Birth/Sex: 1986-08-20 (35 y.o. F) Treating RN: Angelina Pih Primary Care Kitana Gage: Lynnell Chad Other Clinician: Referring Ajit Errico: Lynnell Chad Treating Brandn Mcgath/Extender: Rowan Blase in Treatment: 4 Clinic Level of Care Assessment Items TOOL 1 Quantity Score []  - Use when EandM and Procedure is performed on INITIAL visit 0 ASSESSMENTS - Nursing Assessment / Reassessment []  - General Physical Exam (combine w/ comprehensive assessment  (listed just below) when performed on new 0 pt. evals) []  - 0 Comprehensive Assessment (HX, ROS, Risk Assessments, Wounds Hx, etc.) ASSESSMENTS - Wound and Skin Assessment / Reassessment []  - Dermatologic / Skin Assessment (not related to wound area) 0 ASSESSMENTS - Ostomy and/or Continence Assessment and Care []  - Incontinence Assessment and Management 0 []  - 0 Ostomy Care Assessment and Management (repouching, etc.) PROCESS - Coordination of Care []  - Simple Patient / Family Education for ongoing care 0 []  - 0 Complex (extensive) Patient / Family Education for ongoing care []  - 0 Staff obtains , Records, Test Results / Process Orders []  - 0 Staff telephones HHA, Nursing Homes / Clarify orders / etc []  - 0 Routine Transfer to another Facility (non-emergent condition) []  - 0 Routine Hospital Admission (non-emergent condition) []  - 0 New Admissions / / Ordering NPWT, Apligraf, etc. []  - 0 Emergency Hospital Admission (emergent condition) PROCESS - Special Needs []  - Pediatric / Minor Patient Management 0 []  - 0 Isolation Patient Management []  - 0 Hearing / Language / Visual special needs []  - 0 Assessment of Community assistance (transportation, D/C planning, etc.) []  - 0 Additional assistance / Altered mentation []  - 0 Support Surface(s) Assessment (bed, cushion, seat, etc.) INTERVENTIONS - Miscellaneous []  - External ear exam 0 []  - 0 Patient Transfer (multiple staff / / Similar devices) []  - 0 Simple Staple / Suture removal (25 or less) []  - 0 Complex Staple / Suture removal (26 or more) []  - 0 Hypo/Hyperglycemic Management (do not check if billed separately) []  - 0 Ankle / Brachial Index (ABI) - do not check if billed separately Has the patient been seen at the hospital within the last three years: Yes Total Score: 0 Level Of Care: ____ ( ) Electronic Signature(s) Signed: 03/02/2022 3:50:43 PM  By: Entered By: on 03/02/2022 11:02:32 Chiropractor ( ) -------------------------------------------------------------------------------- Encounter Discharge Information Details Patient Name: Date of Service:  03/02/2022 8:00 AM Medical Record Number: [id number] Patient Account Number: [id number] Date of Birth/Sex: 04-27-1986 (35 y.o. F) Treating RN: Angelina Pih Primary Care Kirstan Fentress: Lynnell Chad Other Clinician: Referring Siani Utke: Lynnell Chad Treating Keoni Havey/Extender: Rowan Blase in Treatment: 4 Encounter Discharge Information Items Discharge Condition: Stable Ambulatory Status: Wheelchair Discharge Destination: Home Transportation: Private Auto Accompanied By: self Schedule Follow-up Appointment: Yes Clinical Summary of Care: Electronic Signature(s) Signed: 03/02/2022 11:01:44 AM By: Angelina Pih Entered By: Angelina Pih on 03/02/2022 11:01:44 [name] ([id number]) -------------------------------------------------------------------------------- Negative Pressure Wound Therapy Maintenance (NPWT) Details Patient Name: Allison, Howell Date of Service: 03/02/2022 8:00 AM Medical Record Number: [id number] Patient Account Number: [id number] Date of Birth/Sex: 28-Oct-1985 (35 y.o. F) Treating RN: Angelina Pih Primary Care Melford Tullier: Lynnell Chad Other Clinician: Referring Amarilis Belflower: Lynnell Chad Treating Casandra Dallaire/Extender: Rowan Blase in Treatment: 4 NPWT Maintenance Performed for: Wound #1 Right Amputation Site - Below Knee Performed By: Angelina Pih, RN Type: Other Coverage Size (sq cm): 51 Pressure Type: Constant Pressure Setting: 125 mmHG Drain Type: None Sponge/Dressing Type: Foam, Black Date Initiated: 01/23/2022 Dressing Removed: Yes Quantity of Sponges/Gauze Removed: 3 Canister Changed: Yes Dressing Reapplied: Yes Quantity of Sponges/Gauze Inserted: 1 Respones To Treatment:  tolerated well Days On NPWT: 39 Electronic Signature(s) Signed: 03/02/2022 10:56:05 AM By: Angelina Pih Entered By: Angelina Pih on 03/02/2022 10:56:04 [name] ([id number]) -------------------------------------------------------------------------------- Wound Assessment Details Patient Name: [name] Date of Service: 03/02/2022 8:00 AM Medical Record Number: [id number] Patient Account Number: [id number] Date of Birth/Sex: 02-Jun-1986 (36 y.o. F) Treating RN: Angelina Pih Primary Care Rockie Vawter: Lynnell Chad Other Clinician: Referring Ailynn Gow: Lynnell Chad Treating Fount Bahe/Extender: Rowan Blase in Treatment: 4 Wound Status Wound Number: 1 Primary Etiology: Abscess Wound Location: Right Amputation Site - Below Knee Wound Status: Open Wounding Event: Gradually Appeared Comorbid History: Hypertension, Type II Diabetes Date Acquired: 01/14/2022 Weeks Of Treatment: 4 Clustered Wound: No Wound Measurements Length: (cm) 6 Width: (cm) 8.5 Depth: (cm) 3.4 Area: (cm) 40.055 Volume: (cm) 136.188 % Reduction in Area: -48.3% % Reduction in Volume: -29.2% Epithelialization: None Tunneling: No Undermining: No Wound Description Classification: Full Thickness With Exposed Support Structures Exudate Amount: Medium Exudate Type: Serosanguineous Exudate Color: red, brown Foul Odor After Cleansing: No Slough/Fibrino Yes Wound Bed Granulation Amount: Large (67-100%) Exposed Structure Granulation Quality: Red, Pink Fascia Exposed: No Necrotic Amount: Small (1-33%) Fat Layer (Subcutaneous Tissue) Exposed: Yes Tendon Exposed: No Muscle Exposed: No Joint Exposed: No Bone Exposed: No Treatment Notes Wound #1 (Amputation Site - Below Knee) Wound Laterality: Right Cleanser Normal Saline Discharge Instruction: Wash your hands with soap and water. Remove old dressing, discard into plastic bag and place into trash. Cleanse the wound with Normal Saline prior to  applying a clean dressing using gauze sponges, not tissues or cotton balls. Do not scrub or use excessive force. Pat dry using gauze sponges, not tissue or cotton balls. Soap and Water Discharge Instruction: Gently cleanse wound with antibacterial soap, rinse and pat dry prior to dressing wounds Peri-Wound Care Topical Primary Dressing Prisma 4.34 (in) Discharge Instruction: Moisten w/normal saline or sterile water; Cover wound as directed. Do not remove from wound bed. Packed dry in undermining area of wound Secondary Dressing Secured With Compression SHYE, DOTY ([id number]) Compression Stockings Add-Ons Electronic Signature(s) Signed: 03/02/2022 3:50:43 PM By: Angelina Pih Entered By: Angelina Pih on 03/02/2022 08:38:11

## 2022-03-06 ENCOUNTER — Ambulatory Visit: Payer: BLUE CROSS/BLUE SHIELD | Admitting: Nurse Practitioner

## 2022-03-06 ENCOUNTER — Encounter: Payer: Medicaid Other | Admitting: Physician Assistant

## 2022-03-06 ENCOUNTER — Encounter: Payer: Self-pay | Admitting: Nurse Practitioner

## 2022-03-06 DIAGNOSIS — E11622 Type 2 diabetes mellitus with other skin ulcer: Secondary | ICD-10-CM | POA: Diagnosis not present

## 2022-03-06 NOTE — Progress Notes (Addendum)
Allison, Howell ([id number]) Visit Report for 03/06/2022 Chief Complaint Document Details Patient Name: [name], [name] Date of Service: 03/06/2022 12:30 PM Medical Record Number: [id number] Patient Account Number: [id number] Date of Birth/Sex: Jun 22, 1986 (36 y.o. F) Treating RN: Yevonne Pax Primary Care Provider: Lynnell Chad Other Clinician: Referring Provider: Lynnell Chad Treating Provider/Extender: Rowan Blase in Treatment: 5 Information Obtained from: Patient Chief Complaint Right BKA Amputation Site Disruption Electronic Signature(s) Signed: 03/06/2022 12:51:56 PM By: Lenda Kelp PA-C Entered By: Lenda Kelp on 03/06/2022 12:51:55 [name] ([id number]) -------------------------------------------------------------------------------- HPI Details Patient Name: [name] Date of Service: 03/06/2022 12:30 PM Medical Record Number: [id number] Patient Account Number: [id number] Date of Birth/Sex: 12/26/1985 (35 y.o. F) Treating RN: Yevonne Pax Primary Care Provider: Lynnell Chad Other Clinician: Referring Provider: Lynnell Chad Treating Provider/Extender: Rowan Blase in Treatment: 5 History of Present Illness HPI Description: 01-27-2022 upon evaluation today patient appears to be doing somewhat poorly in regard to her right below-knee amputation. This unfortunately developed a abscess internally following the initial amputation which was January 2023. Subsequently she was getting very close to starting to work on the prosthesis when she had an area that somewhat bulged out ended up being an abscess and the incision and drainage was performed April 22. Subsequently she did have antibiotic beads placed in a wound VAC after this was cleared out. Her most recent reported hemoglobin A1c was 6.4. With that being said the patient tells me that her blood sugars seem to be under pretty good control as best she can tell at this point. She also tells me that the  wound VAC seems to be functioning appropriately although upon evaluation the did not appear to be anything tucked into the region of undermining and depth in the roughly 9-12 o'clock location based on what we saw today. This is also where the antibiotic beads are and I can see those in place. I do believe that she is getting need to have the wound VAC tucked down into this location. Patient does have a history of diabetes mellitus type 2, diabetic neuropathy, and hypertension. This initial amputation occurred as a result of her having sewing needles that actually went into her heel she did not know about it started having trouble with her heel and ended up with a significant infection which was stated to be necrotizing and she subsequently had to have emergency amputation. 02-13-2022 upon evaluation today patient appears to be doing well with regard to her wound. She has been tolerating the dressing changes without complication. The wound VAC does seem to be doing a great job. Fortunately I do not see any evidence of active infection locally or systemically at this time which is great news. She still has some of the antibiotic beads in the base of the wound. 02-20-2022 upon evaluation today patient appears to be doing well currently. There does not appear to be any evidence of active infection locally nor systemically which is great news and overall I am extremely pleased with where we stand today. I do believe that the wound VAC is doing a good job here and the color of the drainage is improved again were unsure as to whether this may have been the antibiotic beads or if there was something such as Pseudomonas causing the greenish discoloration to the drainage either way she is doing significantly better. 03-06-2022 upon evaluation today patient appears to be doing well with regard to her wound. The only issue I see is that she does have  a little bit of a pocket of fluid that collected in the deeper  section everything else is actually measuring smaller and looks better. This was a point where I really did expect this was been a turnaround and start doing a lot better. I think we are especially seeing that right now. Electronic Signature(s) Signed: 03/06/2022 1:27:20 PM By: Lenda Kelp PA-C Entered By: Lenda Kelp on 03/06/2022 13:27:20 [name] ([id number]) -------------------------------------------------------------------------------- Physical Exam Details Patient Name: [name] Date of Service: 03/06/2022 12:30 PM Medical Record Number: [id number] Patient Account Number: [id number] Date of Birth/Sex: 03-16-1986 (35 y.o. F) Treating RN: Yevonne Pax Primary Care Provider: Lynnell Chad Other Clinician: Referring Provider: Lynnell Chad Treating Provider/Extender: Rowan Blase in Treatment: 5 Constitutional Well-nourished and well-hydrated in no acute distress. Respiratory normal breathing without difficulty. Psychiatric this patient is able to make decisions and demonstrates good insight into disease process. Alert and Oriented x 3. pleasant and cooperative. Notes Upon inspection patient's wound that we tried to tuck some collagen down into the opening area and see if we can get this to pull in a bit still is trapping fluid underneath this is not good. For that reason we will get a switch back to packing although were going to just use some Hydrofera Blue which is much thinner but still with a good tensile strength in order to fill the space and hopefully allow this to slowly granulate in. Overall the wound is looking significantly better. Electronic Signature(s) Signed: 03/06/2022 3:37:01 PM By: Lenda Kelp PA-C Entered By: Lenda Kelp on 03/06/2022 15:37:00 [name] ([id number]) -------------------------------------------------------------------------------- Physician Orders Details Patient Name: [name] Date of Service: 03/06/2022  12:30 PM Medical Record Number: [id number] Patient Account Number: [id number] Date of Birth/Sex: [birth year] (35 y.o. F) Treating RN: Yevonne Pax Primary Care Provider: Lynnell Chad Other Clinician: Referring Provider: Lynnell Chad Treating Provider/Extender: Rowan Blase in Treatment: 5 Verbal / Phone Orders: No Diagnosis Coding ICD-10 Coding Code Description E11.622 Type 2 diabetes mellitus with other skin ulcer T81.31XA Disruption of external operation (surgical) wound, not elsewhere classified, initial encounter L97.812 Non-pressure chronic ulcer of other part of right lower leg with fat layer exposed E11.43 Type 2 diabetes mellitus with diabetic autonomic (poly)neuropathy I10 Essential (primary) hypertension Follow-up Appointments o Return Appointment in 1 week. o Nurse Visit as needed - thursday Bathing/ Shower/ Hygiene o No tub bath. Negative Pressure Wound Therapy o Wound VAC settings at continuous pressure. Use foam to wound cavity. Please order WHITE foam to fill any tunnel/s and/or undermining when necessary. Change VAC dressing 2 X WEEK. Change canister as indicated when full. o Number of foam/gauze pieces used in the dressing = - hydrofera blue to wound bed and area with depth , then 1 piece black foam , o Other: - wet to dry if wound vac malfunctions Wound Treatment Wound #1 - Amputation Site - Below Knee Wound Laterality: Right Cleanser: Normal Saline 3 x Per Week/30 Days Discharge Instructions: Wash your hands with soap and water. Remove old dressing, discard into plastic bag and place into trash. Cleanse the wound with Normal Saline prior to applying a clean dressing using gauze sponges, not tissues or cotton balls. Do not scrub or use excessive force. Pat dry using gauze sponges, not tissue or cotton balls. Cleanser: Soap and Water 3 x Per Week/30 Days Discharge Instructions: Gently cleanse wound with antibacterial soap, rinse and pat  dry prior to dressing wounds Primary Dressing: Hydrofera Blue Ready Transfer Foam, 4x5 (  in/in) 3 x Per Week/30 Days Discharge Instructions: Apply Hydrofera Blue Ready to wound bed as directed Electronic Signature(s) Signed: 03/06/2022 4:19:54 PM By: Lenda Kelp PA-C Signed: 03/06/2022 5:11:25 PM By: Yevonne Pax RN Entered By: Yevonne Pax on 03/06/2022 13:03:07 [name] ([id number]) -------------------------------------------------------------------------------- Problem List Details Patient Name: [name] Date of Service: 03/06/2022 12:30 PM Medical Record Number: [id number] Patient Account Number: [id number] Date of Birth/Sex: 1985-12-16 (35 y.o. F) Treating RN: Yevonne Pax Primary Care Provider: Lynnell Chad Other Clinician: Referring Provider: Lynnell Chad Treating Provider/Extender: Rowan Blase in Treatment: 5 Active Problems ICD-10 Encounter Code Description Active Date MDM Diagnosis E11.622 Type 2 diabetes mellitus with other skin ulcer 01/27/2022 No Yes T81.31XA Disruption of external operation (surgical) wound, not elsewhere 01/27/2022 No Yes classified, initial encounter L97.812 Non-pressure chronic ulcer of other part of right lower leg with fat layer 01/27/2022 No Yes exposed E11.43 Type 2 diabetes mellitus with diabetic autonomic (poly)neuropathy 01/27/2022 No Yes I10 Essential (primary) hypertension 01/27/2022 No Yes Inactive Problems Resolved Problems Electronic Signature(s) Signed: 03/06/2022 12:51:53 PM By: Lenda Kelp PA-C Entered By: Lenda Kelp on 03/06/2022 12:51:53 [name] ([id number]) -------------------------------------------------------------------------------- Progress Note Details Patient Name: [name] Date of Service: 03/06/2022 12:30 PM Medical Record Number: [id number] Patient Account Number: [id number] Date of Birth/Sex: 1986-01-31 (35 y.o. F) Treating RN: Yevonne Pax Primary Care Provider: Lynnell Chad Other  Clinician: Referring Provider: Lynnell Chad Treating Provider/Extender: Rowan Blase in Treatment: 5 Subjective Chief Complaint Information obtained from Patient Right BKA Amputation Site Disruption History of Present Illness (HPI) 01-27-2022 upon evaluation today patient appears to be doing somewhat poorly in regard to her right below-knee amputation. This unfortunately developed a abscess internally following the initial amputation which was January 2023. Subsequently she was getting very close to starting to work on the prosthesis when she had an area that somewhat bulged out ended up being an abscess and the incision and drainage was performed April 22. Subsequently she did have antibiotic beads placed in a wound VAC after this was cleared out. Her most recent reported hemoglobin A1c was 6.4. With that being said the patient tells me that her blood sugars seem to be under pretty good control as best she can tell at this point. She also tells me that the wound VAC seems to be functioning appropriately although upon evaluation the did not appear to be anything tucked into the region of undermining and depth in the roughly 9-12 o'clock location based on what we saw today. This is also where the antibiotic beads are and I can see those in place. I do believe that she is getting need to have the wound VAC tucked down into this location. Patient does have a history of diabetes mellitus type 2, diabetic neuropathy, and hypertension. This initial amputation occurred as a result of her having sewing needles that actually went into her heel she did not know about it started having trouble with her heel and ended up with a significant infection which was stated to be necrotizing and she subsequently had to have emergency amputation. 02-13-2022 upon evaluation today patient appears to be doing well with regard to her wound. She has been tolerating the dressing changes without complication. The  wound VAC does seem to be doing a great job. Fortunately I do not see any evidence of active infection locally or systemically at this time which is great news. She still has some of the antibiotic beads in the base of the wound. 02-20-2022 upon  evaluation today patient appears to be doing well currently. There does not appear to be any evidence of active infection locally nor systemically which is great news and overall I am extremely pleased with where we stand today. I do believe that the wound VAC is doing a good job here and the color of the drainage is improved again were unsure as to whether this may have been the antibiotic beads or if there was something such as Pseudomonas causing the greenish discoloration to the drainage either way she is doing significantly better. 03-06-2022 upon evaluation today patient appears to be doing well with regard to her wound. The only issue I see is that she does have a little bit of a pocket of fluid that collected in the deeper section everything else is actually measuring smaller and looks better. This was a point where I really did expect this was been a turnaround and start doing a lot better. I think we are especially seeing that right now. Objective Constitutional Well-nourished and well-hydrated in no acute distress. Vitals Time Taken: 12:42 PM, Height: 67 in, Weight: 219 lbs, BMI: 34.3, Temperature: 98.1 F, Pulse: 111 bpm, Respiratory Rate: 18 breaths/min, Blood Pressure: 128/86 mmHg. Respiratory normal breathing without difficulty. Psychiatric this patient is able to make decisions and demonstrates good insight into disease process. Alert and Oriented x 3. pleasant and cooperative. General Notes: Upon inspection patient's wound that we tried to tuck some collagen down into the opening area and see if we can get this to pull in a bit still is trapping fluid underneath this is not good. For that reason we will get a switch back to packing although  were going to just use some Hydrofera Blue which is much thinner but still with a good tensile strength in order to fill the space and hopefully allow this to slowly granulate in. Overall the wound is looking significantly better. Integumentary (Hair, Skin) Wound #1 status is Open. Original cause of wound was Gradually Appeared. The date acquired was: 01/14/2022. The wound has been in treatment 5 weeks. The wound is located on the Right Amputation Site - Below Knee. The wound measures 5cm length x 8cm width x 2.4cm depth; [name], [name] ([id number]) 31.416cm^2 area and 75.398cm^3 volume. There is Fat Layer (Subcutaneous Tissue) exposed. There is no tunneling or undermining noted. There is a medium amount of serosanguineous drainage noted. There is large (67-100%) red, pink granulation within the wound bed. There is a small (1-33%) amount of necrotic tissue within the wound bed. 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 Nurse Visit as needed - thursday Bathing/ Shower/ Hygiene: No tub bath. Negative Pressure Wound Therapy: Wound VAC settings at continuous pressure. Use foam to wound cavity. Please order WHITE foam to fill any tunnel/s and/or undermining when necessary. Change VAC dressing 2 X WEEK. Change canister as indicated when full. Number of foam/gauze pieces used in the dressing = - hydrofera blue to wound bed and area with depth , then 1 piece black foam , Other: - wet to dry if wound vac malfunctions WOUND #1: - Amputation Site - Below Knee Wound Laterality: Right Cleanser: Normal Saline 3 x Per Week/30  Days Discharge Instructions: Wash your  hands with soap and water. Remove old dressing, discard into plastic bag and place into trash. Cleanse the wound with Normal Saline prior to applying a clean dressing using gauze sponges, not tissues or cotton balls. Do not scrub or use excessive force. Pat dry using gauze sponges, not tissue or cotton balls. Cleanser: Soap and Water 3 x Per Week/30 Days Discharge Instructions: Gently cleanse wound with antibacterial soap, rinse and pat dry prior to dressing wounds Primary Dressing: Hydrofera Blue Ready Transfer Foam, 4x5 (in/in) 3 x Per Week/30 Days Discharge Instructions: Apply Hydrofera Blue Ready to wound bed as directed 1. I would recommend that we go ahead and continue with the wound care measures as before and the patient is in agreement the plan. This includes the use of the Hydrofera Blue dressing into the base of the wound we will then follow this with the foam over top of the wound VAC. 2. I am also going to recommend that we have the patient continue with changing this 2 times per week which seems to be doing really well. We will see patient back for reevaluation in 1 week here in the clinic. If anything worsens or changes patient will contact our office for additional recommendations. Electronic Signature(s) Signed: 03/06/2022 3:37:41 PM By: Lenda KelpStone III, Azhane Eckart PA-C Entered By: Lenda KelpStone III, Bufford Helms on 03/06/2022 15:37:41 [name], [name] ([id number]) -------------------------------------------------------------------------------- SuperBill Details Patient Name: [name], [name] Date of Service: 03/06/2022 Medical Record Number: [id number] Patient Account Number: [id number] Date of Birth/Sex: Sep 24, 1986 (35 y.o. F) Treating RN: Yevonne PaxEpps, Carrie Primary Care Provider: Lynnell ChadIjaola, Onyeje Other Clinician: Referring Provider: Lynnell ChadIjaola, Onyeje Treating Provider/Extender: Rowan BlaseStone, Oaklyn Jakubek Weeks in Treatment: 5 Diagnosis Coding ICD-10 Codes Code Description E11.622 Type 2  diabetes mellitus with other skin ulcer T81.31XA Disruption of external operation (surgical) wound, not elsewhere classified, initial encounter L97.812 Non-pressure chronic ulcer of other part of right lower leg with fat layer exposed E11.43 Type 2 diabetes mellitus with diabetic autonomic (poly)neuropathy I10 Essential (primary) hypertension Facility Procedures CPT4 Code: [id number] Description: [id number] - WOUND VAC-50 SQ CM OR LESS Modifier: Quantity: 1 Physician Procedures CPT4 Code: [id number] Description: 99214 - WC PHYS LEVEL 4 - EST PT Modifier: Quantity: 1 CPT4 Code: Description: ICD-10 Diagnosis Description E11.622 Type 2 diabetes mellitus with other skin ulcer T81.31XA Disruption of external operation (surgical) wound, not elsewhere classifi L97.812 Non-pressure chronic ulcer of other part of right lower leg with  fat laye E11.43 Type 2 diabetes mellitus with diabetic autonomic (poly)neuropathy Modifier: ed, initial encounter r exposed Quantity: Electronic Signature(s) Signed: 03/06/2022 3:37:54 PM By: Lenda KelpStone III, Merial Moritz PA-C Entered By: Lenda KelpStone III, Delwyn Scoggin on 03/06/2022 15:37:54

## 2022-03-06 NOTE — Progress Notes (Signed)
MCKELL, RIECKE ([id number]) Visit Report for 03/06/2022 Arrival Information Details Patient Name: [name], [name] Date of Service: 03/06/2022 12:30 PM Medical Record Number: [id number] Patient Account Number: [id number] Date of Birth/Sex: [birth year] (36 y.o. F) Treating RN: Yevonne Pax Primary Care Felder Lebeda: Lynnell Chad Other Clinician: Referring Nirvan Laban: Lynnell Chad Treating Mashal Slavick/Extender: Rowan Blase in Treatment: 5 Visit Information History Since Last Visit All ordered tests and consults were completed: No Patient Arrived: Wheel Chair Added or deleted any medications: No Arrival Time: 12:38 Any new allergies or adverse reactions: No Accompanied By: self Had a fall or experienced change in No Transfer Assistance: None activities of daily living that may affect Patient Identification Verified: Yes risk of falls: Secondary Verification Process Completed: Yes Signs or symptoms of abuse/neglect since last visito No Patient Requires Transmission-Based Precautions: No Hospitalized since last visit: No Patient Has Alerts: Yes Implantable device outside of the clinic excluding No Patient Alerts: DIABETIC cellular tissue based products placed in the center since last visit: Has Dressing in Place as Prescribed: Yes Pain Present Now: No Electronic Signature(s) Signed: 03/06/2022 5:11:25 PM By: Yevonne Pax RN Entered By: Yevonne Pax on 03/06/2022 12:42:03 [name] ([id number]) -------------------------------------------------------------------------------- Clinic Level of Care Assessment Details Patient Name: [name] Date of Service: 03/06/2022 12:30 PM Medical Record Number: [id number] Patient Account Number: [id number] Date of Birth/Sex: 09/28/1986 (35 y.o. F) Treating RN: Yevonne Pax Primary Care Julion Gatt: Lynnell Chad Other Clinician: Referring Linh Hedberg: Lynnell Chad Treating Klever Twyford/Extender: Rowan Blase in Treatment: 5 Clinic Level of Care  Assessment Items TOOL 1 Quantity Score  - Use when EandM and Procedure is performed on INITIAL visit 0 ASSESSMENTS - Nursing Assessment / Reassessment  - General Physical Exam (combine w/ comprehensive assessment (listed just below) when performed on new 0 pt. evals)  - 0 Comprehensive Assessment (HX, ROS, Risk Assessments, Wounds Hx, etc.) ASSESSMENTS - Wound and Skin Assessment / Reassessment  - Dermatologic / Skin Assessment (not related to wound area) 0 ASSESSMENTS - Ostomy and/or Continence Assessment and Care  - Incontinence Assessment and Management 0  - 0 Ostomy Care Assessment and Management (repouching, etc.) PROCESS - Coordination of Care  - Simple Patient / Family Education for ongoing care 0  - 0 Complex (extensive) Patient / Family Education for ongoing care  - 0 Staff obtains Chiropractor, Records, Test Results / Process Orders  - 0 Staff telephones HHA, Nursing Homes / Clarify orders / etc  - 0 Routine Transfer to another Facility (non-emergent condition)  - 0 Routine Hospital Admission (non-emergent condition)  - 0 New Admissions / Manufacturing engineer / Ordering NPWT, Apligraf, etc.  - 0 Emergency Hospital Admission (emergent condition) PROCESS - Special Needs  - Pediatric / Minor Patient Management 0  - 0 Isolation Patient Management  - 0 Hearing / Language / Visual special needs  - 0 Assessment of Community assistance (transportation, D/C planning, etc.)  - 0 Additional assistance / Altered mentation  - 0 Support Surface(s) Assessment (bed, cushion, seat, etc.) INTERVENTIONS - Miscellaneous  - External ear exam 0  - 0 Patient Transfer (multiple staff / Nurse, adult / Similar devices)  - 0 Simple Staple / Suture removal (25 or less)  - 0 Complex Staple / Suture removal (26 or more)  - 0 Hypo/Hyperglycemic Management (do not check if billed separately)  - 0 Ankle / Brachial Index (ABI) - do not  check if billed separately Has the patient been seen at the hospital within the last three years: Yes Total Score: 0 Level  Of Care: ____ [name], [name] ([id number]) Electronic Signature(s) Signed: 03/06/2022 5:11:25 PM By: Yevonne PaxEpps, Carrie RN Entered By: Yevonne PaxEpps, Carrie on 03/06/2022 13:03:13 [name], [name] ([id number]) -------------------------------------------------------------------------------- Encounter Discharge Information Details Patient Name: [name], [name] Date of Service: 03/06/2022 12:30 PM Medical Record Number: [id number] Patient Account Number: [id number] Date of Birth/Sex: [birth year] (35 y.o. F) Treating RN: Yevonne PaxEpps, Carrie Primary Care Annison Birchard: Lynnell ChadIjaola, Onyeje Other Clinician: Referring Lilyahna Sirmon: Lynnell ChadIjaola, Onyeje Treating [place] Grayer/Extender: Rowan BlaseStone, Hoyt Weeks in Treatment: 5 Encounter Discharge Information Items Discharge Condition: Stable Ambulatory Status: Wheelchair Discharge Destination: Home Transportation: Private Auto Accompanied By: self Schedule Follow-up Appointment: Yes Clinical Summary of Care: Electronic Signature(s) Signed: 03/06/2022 5:11:25 PM By: Yevonne PaxEpps, Carrie RN Entered By: Yevonne PaxEpps, Carrie on 03/06/2022 13:03:57 [name], [name] ([id number]) -------------------------------------------------------------------------------- Lower Extremity Assessment Details Patient Name: [name], [name] Date of Service: 03/06/2022 12:30 PM Medical Record Number: [id number] Patient Account Number: [id number] Date of Birth/Sex: [birth year] (35 y.o. F) Treating RN: Yevonne PaxEpps, Carrie Primary Care Charda Janis: Lynnell ChadIjaola, Onyeje Other Clinician: Referring Elai Vanwyk: Lynnell ChadIjaola, Onyeje Treating Virgil Slinger/Extender: Rowan BlaseStone, Hoyt Weeks in Treatment: 5 Electronic Signature(s) Signed: 03/06/2022 5:11:25 PM By: Yevonne PaxEpps, Carrie RN Entered By: Yevonne PaxEpps, Carrie on 03/06/2022 12:51:13 [name], [name] ([id number]) -------------------------------------------------------------------------------- Multi Wound Chart Details Patient  Name: [name], [name] Date of Service: 03/06/2022 12:30 PM Medical Record Number: [id number] Patient Account Number: [id number] Date of Birth/Sex: [birth year] (35 y.o. F) Treating RN: Yevonne PaxEpps, Carrie Primary Care Jaedon Siler: Lynnell ChadIjaola, Onyeje Other Clinician: Referring Jaye Saal: Lynnell ChadIjaola, Onyeje Treating Aleigh Grunden/Extender: Rowan BlaseStone, Hoyt Weeks in Treatment: 5 Vital Signs Height(in): 67 Pulse(bpm): 111 Weight(lbs): 219 Blood Pressure(mmHg): 128/86 Body Mass Index(BMI): 34.3 Temperature(F): 98.1 Respiratory Rate(breaths/min): 18 Photos: [N/A:N/A] Wound Location: Right Amputation Site - Below Knee N/A N/A Wounding Event: Gradually Appeared N/A N/A Primary Etiology: Abscess N/A N/A Comorbid History: Hypertension, Type II Diabetes N/A N/A Date Acquired: 01/14/2022 N/A N/A Weeks of Treatment: 5 N/A N/A Wound Status: Open N/A N/A Wound Recurrence: No N/A N/A Measurements L x W x D (cm) 5x8x2.4 N/A N/A Area (cm) : 31.416 N/A N/A Volume (cm) : 75.398 N/A N/A % Reduction in Area: -16.30% N/A N/A % Reduction in Volume: 28.40% N/A N/A Classification: Full Thickness With Exposed N/A N/A Support Structures Exudate Amount: Medium N/A N/A Exudate Type: Serosanguineous N/A N/A Exudate Color: red, brown N/A N/A Granulation Amount: Large (67-100%) N/A N/A Granulation Quality: Red, Pink N/A N/A Necrotic Amount: Small (1-33%) N/A N/A Exposed Structures: Fat Layer (Subcutaneous Tissue): N/A N/A Yes Fascia: No Tendon: No Muscle: No Joint: No Bone: No Epithelialization: None N/A N/A Treatment Notes Electronic Signature(s) Signed: 03/06/2022 5:11:25 PM By: Yevonne PaxEpps, Carrie RN Entered By: Yevonne PaxEpps, Carrie on 03/06/2022 12:51:23 [name], [name] ([id number]) -------------------------------------------------------------------------------- Multi-Disciplinary Care Plan Details Patient Name: [name], [name] Date of Service: 03/06/2022 12:30 PM Medical Record Number: [id number] Patient Account Number: [id number] Date of  Birth/Sex: [birth year] (35 y.o. F) Treating RN: Yevonne PaxEpps, Carrie Primary Care Trixie Maclaren: Lynnell ChadIjaola, Onyeje Other Clinician: Referring Shaquita Fort: Lynnell ChadIjaola, Onyeje Treating Meztli Llanas/Extender: Rowan BlaseStone, Hoyt Weeks in Treatment: 5 Active Inactive Wound/Skin Impairment Nursing Diagnoses: Knowledge deficit related to ulceration/compromised skin integrity Goals: Patient/caregiver will verbalize understanding of skin care regimen Date Initiated: 01/27/2022 Target Resolution Date: 02/26/2022 Goal Status: Active Ulcer/skin breakdown will have a volume reduction of 30% by week 4 Date Initiated: 01/27/2022 Target Resolution Date: 03/29/2022 Goal Status: Active Ulcer/skin breakdown will have a volume reduction of 50% by week 8 Date Initiated: 01/27/2022 Target Resolution Date: 04/28/2022 Goal Status: Active Ulcer/skin breakdown will have a volume reduction of 80% by week 12 Date Initiated: 01/27/2022 Target Resolution Date: 05/29/2022 Goal Status:  Active Ulcer/skin breakdown will heal within 14 weeks Date Initiated: 01/27/2022 Target Resolution Date: 06/29/2022 Goal Status: Active Interventions: Assess patient/caregiver ability to obtain necessary supplies Assess patient/caregiver ability to perform ulcer/skin care regimen upon admission and as needed Assess ulceration(s) every visit Notes: Electronic Signature(s) Signed: 03/06/2022 5:11:25 PM By: Yevonne Pax RN Entered By: Yevonne Pax on 03/06/2022 12:51:17 [name] ([id number]) -------------------------------------------------------------------------------- Negative Pressure Wound Therapy Maintenance (NPWT) Details Patient Name: [name], [name] Date of Service: 03/06/2022 12:30 PM Medical Record Number: [id number] Patient Account Number: [id number] Date of Birth/Sex: 1985/11/06 (36 y.o. F) Treating RN: Yevonne Pax Primary Care Demarlo Riojas: Lynnell Chad Other Clinician: Referring Allisen Pidgeon: Lynnell Chad Treating Jaquelinne Glendening/Extender: Rowan Blase in  Treatment: 5 NPWT Maintenance Performed for: Wound #1 Right Amputation Site - Below Knee Performed By: Yevonne Pax, RN Type: Other Coverage Size (sq cm): 40 Pressure Type: Constant Pressure Setting: 125 mmHG Drain Type: None Sponge/Dressing Type: Foam, Black Date Initiated: 01/23/2022 Dressing Removed: No Quantity of Sponges/Gauze Removed: hydrofera blue and black foam Canister Changed: No Dressing Reapplied: No Quantity of Sponges/Gauze Inserted: 2 Days On NPWT: 43 Post Procedure Diagnosis Same as Pre-procedure Electronic Signature(s) Signed: 03/06/2022 5:11:25 PM By: Yevonne Pax RN Entered By: Yevonne Pax on 03/06/2022 13:01:29 [name] ([id number]) -------------------------------------------------------------------------------- Pain Assessment Details Patient Name: [name] Date of Service: 03/06/2022 12:30 PM Medical Record Number: [id number] Patient Account Number: [id number] Date of Birth/Sex: January 05, 1986 (36 y.o. F) Treating RN: Yevonne Pax Primary Care Hazyl Marseille: Lynnell Chad Other Clinician: Referring Elizer Bostic: Lynnell Chad Treating Ahad Colarusso/Extender: Rowan Blase in Treatment: 5 Active Problems Location of Pain Severity and Description of Pain Patient Has Paino No Site Locations Pain Management and Medication Current Pain Management: Electronic Signature(s) Signed: 03/06/2022 5:11:25 PM By: Yevonne Pax RN Entered By: Yevonne Pax on 03/06/2022 12:42:31 [name] ([id number]) -------------------------------------------------------------------------------- Patient/Caregiver Education Details Patient Name: [name] Date of Service: 03/06/2022 12:30 PM Medical Record Number: [id number] Patient Account Number: [id number] Date of Birth/Gender: October 31, 1985 (35 y.o. F) Treating RN: Yevonne Pax Primary Care Physician: Lynnell Chad Other Clinician: Referring Physician: Lynnell Chad Treating Physician/Extender: Rowan Blase in  Treatment: 5 Education Assessment Education Provided To: Patient Education Topics Provided Wound/Skin Impairment: Methods: Explain/Verbal Responses: State content correctly Electronic Signature(s) Signed: 03/06/2022 5:11:25 PM By: Yevonne Pax RN Entered By: Yevonne Pax on 03/06/2022 13:03:28 [name] ([id number]) -------------------------------------------------------------------------------- Wound Assessment Details Patient Name: [name] Date of Service: 03/06/2022 12:30 PM Medical Record Number: [id number] Patient Account Number: [id number] Date of Birth/Sex: November 26, 1985 (35 y.o. F) Treating RN: Yevonne Pax Primary Care Riad Wagley: Lynnell Chad Other Clinician: Referring Meko Masterson: Lynnell Chad Treating Jobina Maita/Extender: Rowan Blase in Treatment: 5 Wound Status Wound Number: 1 Primary Etiology: Abscess Wound Location: Right Amputation Site - Below Knee Wound Status: Open Wounding Event: Gradually Appeared Comorbid History: Hypertension, Type II Diabetes Date Acquired: 01/14/2022 Weeks Of Treatment: 5 Clustered Wound: No Photos Wound Measurements Length: (cm) 5 Width: (cm) 8 Depth: (cm) 2.4 Area: (cm) 31.416 Volume: (cm) 75.398 % Reduction in Area: -16.3% % Reduction in Volume: 28.4% Epithelialization: None Tunneling: No Undermining: No Wound Description Classification: Full Thickness With Exposed Support Structures Exudate Amount: Medium Exudate Type: Serosanguineous Exudate Color: red, brown Foul Odor After Cleansing: No Slough/Fibrino Yes Wound Bed Granulation Amount: Large (67-100%) Exposed Structure Granulation Quality: Red, Pink Fascia Exposed: No Necrotic Amount: Small (1-33%) Fat Layer (Subcutaneous Tissue) Exposed: Yes Tendon Exposed: No Muscle Exposed: No Joint Exposed: No Bone Exposed: No Treatment Notes Wound #1 (Amputation Site - Below Knee) Wound Laterality: Right  Cleanser Normal Saline Discharge Instruction: Wash your  hands with soap and water. Remove old dressing, discard into plastic bag and place into trash. Cleanse the wound with Normal Saline prior to applying a clean dressing using gauze sponges, not tissues or cotton balls. Do not scrub or use excessive force. Pat dry using gauze sponges, not tissue or cotton balls. Soap and Water [place], REMIE ([id number]) Discharge Instruction: Gently cleanse wound with antibacterial soap, rinse and pat dry prior to dressing wounds Peri-Wound Care Topical Primary Dressing Hydrofera Blue Ready Transfer Foam, 4x5 (in/in) Discharge Instruction: Apply Hydrofera Blue Ready to wound bed as directed Secondary Dressing Secured With Compression Wrap Compression Stockings Add-Ons Electronic Signature(s) Signed: 03/06/2022 5:11:25 PM By: Yevonne Pax RN Entered By: Yevonne Pax on 03/06/2022 12:51:03 [name] ([id number]) -------------------------------------------------------------------------------- Vitals Details Patient Name: [name] Date of Service: 03/06/2022 12:30 PM Medical Record Number: [id number] Patient Account Number: [id number] Date of Birth/Sex: [birth year] (35 y.o. F) Treating RN: Yevonne Pax Primary Care Proctor Antolin: Lynnell Chad Other Clinician: Referring Tushar Enns: Lynnell Chad Treating Brayon Bielefeld/Extender: Rowan Blase in Treatment: 5 Vital Signs Time Taken: 12:42 Temperature (F): 98.1 Height (in): 67 Pulse (bpm): 111 Weight (lbs): 219 Respiratory Rate (breaths/min): 18 Body Mass Index (BMI): 34.3 Blood Pressure (mmHg): 128/86 Reference Range: 80 - 120 mg / dl Electronic Signature(s) Signed: 03/06/2022 5:11:25 PM By: Yevonne Pax RN Entered By: Yevonne Pax on 03/06/2022 12:42:23

## 2022-03-08 ENCOUNTER — Encounter: Payer: Self-pay | Admitting: Nurse Practitioner

## 2022-03-08 ENCOUNTER — Ambulatory Visit: Payer: Medicaid Other | Admitting: Nurse Practitioner

## 2022-03-09 DIAGNOSIS — E11622 Type 2 diabetes mellitus with other skin ulcer: Secondary | ICD-10-CM | POA: Diagnosis not present

## 2022-03-09 NOTE — Progress Notes (Signed)
MAYCIE, LUERA ([id number]) Visit Report for 03/09/2022 Physician Orders Details Patient Name: [name], [name] Date of Service: 03/09/2022 8:00 AM Medical Record Number: [id number] Patient Account Number: [id number] Date of Birth/Sex: October 13, 1985 (36 y.o. F) Treating RN: Angelina Pih Primary Care Provider: Lynnell Chad Other Clinician: Referring Provider: Lynnell Chad Treating Provider/Extender: Rowan Blase in Treatment: 5 Verbal / Phone Orders: No Diagnosis Coding Follow-up Appointments o Return Appointment in 1 week. o Nurse Visit as needed - thursday Bathing/ Shower/ Hygiene o No tub bath. Negative Pressure Wound Therapy o Wound VAC settings at continuous pressure. Use foam to wound cavity. Please order WHITE foam to fill any tunnel/s and/or undermining when necessary. Change VAC dressing 2 X WEEK. Change canister as indicated when full. o Number of foam/gauze pieces used in the dressing = - hydrofera blue to wound bed and area with depth , then 1 piece black foam , o Other: - wet to dry if wound vac malfunctions Wound Treatment Wound #1 - Amputation Site - Below Knee Wound Laterality: Right Cleanser: Normal Saline 3 x Per Week/30 Days Discharge Instructions: Wash your hands with soap and water. Remove old dressing, discard into plastic bag and place into trash. Cleanse the wound with Normal Saline prior to applying a clean dressing using gauze sponges, not tissues or cotton balls. Do not scrub or use excessive force. Pat dry using gauze sponges, not tissue or cotton balls. Cleanser: Soap and Water 3 x Per Week/30 Days Discharge Instructions: Gently cleanse wound with antibacterial soap, rinse and pat dry prior to dressing wounds Primary Dressing: Hydrofera Blue Ready Transfer Foam, 4x5 (in/in) 3 x Per Week/30 Days Discharge Instructions: Apply Hydrofera Blue Ready to wound bed as directed Electronic Signature(s) Signed: 03/09/2022 11:02:28 AM By: Angelina Pih Signed: 03/09/2022 4:11:43 PM By: Lenda Kelp PA-C Entered By: Angelina Pih on 03/09/2022 11:02:27 [name] ([id number]) -------------------------------------------------------------------------------- SuperBill Details Patient Name: [name] Date of Service: 03/09/2022 Medical Record Number: [id number] Patient Account Number: [id number] Date of Birth/Sex: Feb 24, 1986 (35 y.o. F) Treating RN: Angelina Pih Primary Care Provider: Lynnell Chad Other Clinician: Referring Provider: Lynnell Chad Treating Provider/Extender: Rowan Blase in Treatment: 5 Diagnosis Coding ICD-10 Codes Code Description E11.622 Type 2 diabetes mellitus with other skin ulcer T81.31XA Disruption of external operation (surgical) wound, not elsewhere classified, initial encounter L97.812 Non-pressure chronic ulcer of other part of right lower leg with fat layer exposed E11.43 Type 2 diabetes mellitus with diabetic autonomic (poly)neuropathy I10 Essential (primary) hypertension Facility Procedures CPT4 Code: 24268341 Description: [phone] - WOUND VAC-50 SQ CM OR LESS Modifier: Quantity: 1 CPT4 Code: Description: ICD-10 Diagnosis Description T81.31XA Disruption of external operation (surgical) wound, not elsewhere classifie E11.622 Type 2 diabetes mellitus with other skin ulcer Modifier: d, initial encounter Quantity: Physician Procedures CPT4 Code: [id number] Description: 97605 - WC PHYS TX WOUND VAC < 50 SQ CM Modifier: Quantity: 1 CPT4 Code: Description: ICD-10 Diagnosis Description T81.31XA Disruption of external operation (surgical) wound, not elsewhere classified, E11.622 Type 2 diabetes mellitus with other skin ulcer Modifier: initial encounter Quantity: Electronic Signature(s) Signed: 03/09/2022 11:06:20 AM By: Angelina Pih Signed: 03/09/2022 4:11:43 PM By: Lenda Kelp PA-C Entered By: Angelina Pih on 03/09/2022 11:06:20

## 2022-03-09 NOTE — Progress Notes (Signed)
[name] ([id number]) Visit Report for 03/09/2022 Arrival Information Details Patient Name: Allison, Howell Date of Service: 03/09/2022 8:00 AM Medical Record Number: [id number] Patient Account Number: [id number] Date of Birth/Sex: August 02, 1986 (36 y.o. F) Treating RN: Levora Dredge Primary Care Deseree Zemaitis: Jac Canavan Other Clinician: Referring Lorelei Heikkila: Jac Canavan Treating Ein Rijo/Extender: Skipper Cliche in Treatment: 5 Visit Information History Since Last Visit Added or deleted any medications: No Patient Arrived: Wheel Chair Any new allergies or adverse reactions: No Arrival Time: 07:59 Had a fall or experienced change in No Accompanied By: self activities of daily living that may affect Transfer Assistance: None risk of falls: Patient Identification Verified: Yes Hospitalized since last visit: No Secondary Verification Process Completed: Yes Has Dressing in Place as Prescribed: Yes Patient Requires Transmission-Based Precautions: No Pain Present Now: No Patient Has Alerts: Yes Patient Alerts: DIABETIC Electronic Signature(s) Signed: 03/09/2022 1:44:13 PM By: Levora Dredge Entered By: Levora Dredge on 03/09/2022 08:01:47 [name] ([id number]) -------------------------------------------------------------------------------- Clinic Level of Care Assessment Details Patient Name: [name] Date of Service: 03/09/2022 8:00 AM Medical Record Number: [id number] Patient Account Number: [id number] Date of Birth/Sex: 12-09-1985 (36 y.o. F) Treating RN: Levora Dredge Primary Care Aolanis Crispen: Jac Canavan Other Clinician: Referring Jennae Hakeem: Jac Canavan Treating Tinisha Etzkorn/Extender: Skipper Cliche in Treatment: 5 Clinic Level of Care Assessment Items TOOL 1 Quantity Score []  - Use when EandM and Procedure is performed on INITIAL visit 0 ASSESSMENTS - Nursing Assessment / Reassessment []  - General Physical Exam (combine w/ comprehensive assessment  (listed just below) when performed on new 0 pt. evals) []  - 0 Comprehensive Assessment (HX, ROS, Risk Assessments, Wounds Hx, etc.) ASSESSMENTS - Wound and Skin Assessment / Reassessment []  - Dermatologic / Skin Assessment (not related to wound area) 0 ASSESSMENTS - Ostomy and/or Continence Assessment and Care []  - Incontinence Assessment and Management 0 []  - 0 Ostomy Care Assessment and Management (repouching, etc.) PROCESS - Coordination of Care []  - Simple Patient / Family Education for ongoing care 0 []  - 0 Complex (extensive) Patient / Family Education for ongoing care []  - 0 Staff obtains Programmer, systems, Records, Test Results / Process Orders []  - 0 Staff telephones HHA, Nursing Homes / Clarify orders / etc []  - 0 Routine Transfer to another Facility (non-emergent condition) []  - 0 Routine Hospital Admission (non-emergent condition) []  - 0 New Admissions / Biomedical engineer / Ordering NPWT, Apligraf, etc. []  - 0 Emergency Hospital Admission (emergent condition) PROCESS - Special Needs []  - Pediatric / Minor Patient Management 0 []  - 0 Isolation Patient Management []  - 0 Hearing / Language / Visual special needs []  - 0 Assessment of Community assistance (transportation, D/C planning, etc.) []  - 0 Additional assistance / Altered mentation []  - 0 Support Surface(s) Assessment (bed, cushion, seat, etc.) INTERVENTIONS - Miscellaneous []  - External ear exam 0 []  - 0 Patient Transfer (multiple staff / Civil Service fast streamer / Similar devices) []  - 0 Simple Staple / Suture removal (25 or less) []  - 0 Complex Staple / Suture removal (26 or more) []  - 0 Hypo/Hyperglycemic Management (do not check if billed separately) []  - 0 Ankle / Brachial Index (ABI) - do not check if billed separately Has the patient been seen at the hospital within the last three years: Yes Total Score: 0 Level Of Care: ____ [name] ([id number]) Electronic Signature(s) Signed: 03/09/2022 1:44:13 PM  By: Levora Dredge Entered By: Levora Dredge on 03/09/2022 11:06:07 [name] ([id number]) -------------------------------------------------------------------------------- Encounter Discharge Information Details Patient Name: [name] Date of Service:  03/09/2022 8:00 AM Medical Record Number: [id number] Patient Account Number: [id number] Date of Birth/Sex: [birth year] (36 y.o. F) Treating RN: Levora Dredge Primary Care Cornelious Bartolucci: Jac Canavan Other Clinician: Referring Lon Klippel: Jac Canavan Treating Nimisha Rathel/Extender: Skipper Cliche in Treatment: 5 Encounter Discharge Information Items Discharge Condition: Stable Ambulatory Status: Wheelchair Discharge Destination: Home Transportation: Private Auto Accompanied By: self Schedule Follow-up Appointment: Yes Clinical Summary of Care: Electronic Signature(s) Signed: 03/09/2022 11:03:01 AM By: Levora Dredge Entered By: Levora Dredge on 03/09/2022 11:03:01 [name] ([id number]) -------------------------------------------------------------------------------- Negative Pressure Wound Therapy Maintenance (NPWT) Details Patient Name: [name], [name] Date of Service: 03/09/2022 8:00 AM Medical Record Number: [id number] Patient Account Number: [id number] Date of Birth/Sex: 02/20/1986 (36 y.o. F) Treating RN: Levora Dredge Primary Care Ozias Dicenzo: Jac Canavan Other Clinician: Referring Kam Kushnir: Jac Canavan Treating Dion Sibal/Extender: Skipper Cliche in Treatment: 5 NPWT Maintenance Performed for: Wound #1 Right Amputation Site - Below Knee Performed By: Levora Dredge, RN Type: Other Coverage Size (sq cm): 40 Pressure Type: Constant Pressure Setting: 125 mmHG Drain Type: None Sponge/Dressing Type: Foam, Black Date Initiated: 01/23/2022 Dressing Removed: No Quantity of Sponges/Gauze Removed: 1 Canister Changed: No Dressing Reapplied: No Quantity of Sponges/Gauze Inserted: 1 Respones To Treatment:  tolerated well Days On NPWT: 46 Electronic Signature(s) Signed: 03/09/2022 11:02:13 AM By: Levora Dredge Entered By: Levora Dredge on 03/09/2022 11:02:12 [name] ([id number]) -------------------------------------------------------------------------------- Wound Assessment Details Patient Name: [name] Date of Service: 03/09/2022 8:00 AM Medical Record Number: [id number] Patient Account Number: [id number] Date of Birth/Sex: 02/05/1986 (36 y.o. F) Treating RN: Levora Dredge Primary Care Daveda Larock: Jac Canavan Other Clinician: Referring Shannel Zahm: Jac Canavan Treating Yvonda Fouty/Extender: Skipper Cliche in Treatment: 5 Wound Status Wound Number: 1 Primary Etiology: Abscess Wound Location: Right Amputation Site - Below Knee Wound Status: Open Wounding Event: Gradually Appeared Comorbid History: Hypertension, Type II Diabetes Date Acquired: 01/14/2022 Weeks Of Treatment: 5 Clustered Wound: No Wound Measurements Length: (cm) 5 Width: (cm) 8 Depth: (cm) 2.4 Area: (cm) 31.416 Volume: (cm) 75.398 % Reduction in Area: -16.3% % Reduction in Volume: 28.4% Epithelialization: None Tunneling: No Undermining: No Wound Description Classification: Full Thickness With Exposed Support Structures Exudate Amount: Medium Exudate Type: Serosanguineous Exudate Color: red, brown Foul Odor After Cleansing: No Slough/Fibrino Yes Wound Bed Granulation Amount: Large (67-100%) Exposed Structure Granulation Quality: Red, Pink Fascia Exposed: No Necrotic Amount: Small (1-33%) Fat Layer (Subcutaneous Tissue) Exposed: Yes Tendon Exposed: No Muscle Exposed: No Joint Exposed: No Bone Exposed: No Treatment Notes Wound #1 (Amputation Site - Below Knee) Wound Laterality: Right Cleanser Normal Saline Discharge Instruction: Wash your hands with soap and water. Remove old dressing, discard into plastic bag and place into trash. Cleanse the wound with Normal Saline prior to  applying a clean dressing using gauze sponges, not tissues or cotton balls. Do not scrub or use excessive force. Pat dry using gauze sponges, not tissue or cotton balls. Soap and Water Discharge Instruction: Gently cleanse wound with antibacterial soap, rinse and pat dry prior to dressing wounds Peri-Wound Care Topical Primary Dressing Hydrofera Blue Ready Transfer Foam, 4x5 (in/in) Discharge Instruction: Apply Hydrofera Blue Ready to wound bed as directed Secondary Dressing Secured With Compression [name], [name] ([id number]) Compression Stockings Add-Ons Electronic Signature(s) Signed: 03/09/2022 1:44:13 PM By: Levora Dredge Entered By: Levora Dredge on 03/09/2022 08:02:12

## 2022-03-13 DIAGNOSIS — E11622 Type 2 diabetes mellitus with other skin ulcer: Secondary | ICD-10-CM | POA: Diagnosis not present

## 2022-03-13 NOTE — Progress Notes (Signed)
RUTHANNE, MCNEISH ([id number]) Visit Report for 03/13/2022 Arrival Information Details Patient Name: [name], [name] Date of Service: 03/13/2022 8:00 AM Medical Record Number: [id number] Patient Account Number: [id number] Date of Birth/Sex: 11/24/1985 (36 y.o. F) Treating RN: Angelina Pih Primary Care Javan Gonzaga: Lynnell Chad Other Clinician: Referring Revis Whalin: Lynnell Chad Treating Ilsa Bonello/Extender: Rowan Blase in Treatment: 6 Visit Information History Since Last Visit Added or deleted any medications: No Patient Arrived: Wheel Chair Any new allergies or adverse reactions: No Arrival Time: 07:59 Had a fall or experienced change in No Accompanied By: self activities of daily living that may affect Transfer Assistance: None risk of falls: Patient Identification Verified: Yes Hospitalized since last visit: No Secondary Verification Process Completed: Yes Has Dressing in Place as Prescribed: Yes Patient Requires Transmission-Based Precautions: No Has Compression in Place as Prescribed: Yes Patient Has Alerts: Yes Pain Present Now: No Patient Alerts: DIABETIC Notes pt states noticed pink drainage in tubing starting on Saturday and then turned more red. PA Stone made aware Electronic Signature(s) Signed: 03/13/2022 9:41:34 AM By: Angelina Pih Entered By: Angelina Pih on 03/13/2022 09:41:33 [name] ([id number]) -------------------------------------------------------------------------------- Clinic Level of Care Assessment Details Patient Name: [name] Date of Service: 03/13/2022 8:00 AM Medical Record Number: [id number] Patient Account Number: [id number] Date of Birth/Sex: Feb 12, 1986 (35 y.o. F) Treating RN: Angelina Pih Primary Care Xzayvier Fagin: Lynnell Chad Other Clinician: Referring Loretta Kluender: Lynnell Chad Treating Jakeya Gherardi/Extender: Rowan Blase in Treatment: 6 Clinic Level of Care Assessment Items TOOL 1 Quantity Score []  - Use when EandM  and Procedure is performed on INITIAL visit 0 ASSESSMENTS - Nursing Assessment / Reassessment []  - General Physical Exam (combine w/ comprehensive assessment (listed just below) when performed on new 0 pt. evals) []  - 0 Comprehensive Assessment (HX, ROS, Risk Assessments, Wounds Hx, etc.) ASSESSMENTS - Wound and Skin Assessment / Reassessment []  - Dermatologic / Skin Assessment (not related to wound area) 0 ASSESSMENTS - Ostomy and/or Continence Assessment and Care []  - Incontinence Assessment and Management 0 []  - 0 Ostomy Care Assessment and Management (repouching, etc.) PROCESS - Coordination of Care []  - Simple Patient / Family Education for ongoing care 0 []  - 0 Complex (extensive) Patient / Family Education for ongoing care []  - 0 Staff obtains , Records, Test Results / Process Orders []  - 0 Staff telephones HHA, Nursing Homes / Clarify orders / etc []  - 0 Routine Transfer to another Facility (non-emergent condition) []  - 0 Routine Hospital Admission (non-emergent condition) []  - 0 New Admissions / / Ordering NPWT, Apligraf, etc. []  - 0 Emergency Hospital Admission (emergent condition) PROCESS - Special Needs []  - Pediatric / Minor Patient Management 0 []  - 0 Isolation Patient Management []  - 0 Hearing / Language / Visual special needs []  - 0 Assessment of Community assistance (transportation, D/C planning, etc.) []  - 0 Additional assistance / Altered mentation []  - 0 Support Surface(s) Assessment (bed, cushion, seat, etc.) INTERVENTIONS - Miscellaneous []  - External ear exam 0 []  - 0 Patient Transfer (multiple staff / / Similar devices) []  - 0 Simple Staple / Suture removal (25 or less) []  - 0 Complex Staple / Suture removal (26 or more) []  - 0 Hypo/Hyperglycemic Management (do not check if billed separately) []  - 0 Ankle / Brachial Index (ABI) - do not check if billed separately Has the patient been seen at the  hospital within the last three years: Yes Total Score: 0 Level Of Care: ____ ( ) Electronic Signature(s)  By: Angelina Pih on 03/13/2022 09:44:54 Signature(s): Date(s): [name] ([id number]) -------------------------------------------------------------------------------- Encounter Discharge Information Details Patient Name: Allison, Howell Date of Service: 03/13/2022 8:00 AM Medical Record Number: [id number] Patient Account Number: [id number] Date of Birth/Sex: Oct 06, 1985 (36 y.o. F) Treating RN: Angelina Pih Primary Care Rahim Astorga: Lynnell Chad Other Clinician: Referring Lakesia Dahle: Lynnell Chad Treating Cory Rama/Extender: Rowan Blase in Treatment: 6 Encounter Discharge Information Items Discharge Condition: Stable Ambulatory Status: Wheelchair Discharge Destination: Home Transportation: Private Auto Accompanied By: self Schedule Follow-up Appointment: Yes Clinical Summary of Care: Electronic Signature(s) Signed: 03/13/2022 9:44:43 AM By: Angelina Pih Entered By: Angelina Pih on 03/13/2022 09:44:42 [name] ([id number]) -------------------------------------------------------------------------------- Negative Pressure Wound Therapy Maintenance (NPWT) Details Patient Name: Allison, Howell Date of Service: 03/13/2022 8:00 AM Medical Record Number: [id number] Patient Account Number: [id number] Date of Birth/Sex: [birth year] (35 y.o. F) Treating RN: Angelina Pih Primary Care Cirilo Canner: Lynnell Chad Other Clinician: Referring Debany Vantol: Lynnell Chad Treating Heaven Wandell/Extender: Rowan Blase in Treatment: 6 NPWT Maintenance Performed for: Wound #1 Right Amputation Site - Below Knee Performed By: Angelina Pih, RN Type: Other Coverage Size (sq cm): 40 Pressure Type: Constant Pressure Setting: 125 mmHG Drain Type: None Sponge/Dressing Type: Foam, Black Date Initiated: 01/23/2022 Dressing Removed:  No Quantity of Sponges/Gauze Removed: 1 Canister Changed: No Dressing Reapplied: No Quantity of Sponges/Gauze Inserted: 1 Respones To Treatment: tolerated well Days On NPWT: 50 Notes HB used under foam and placed in depth area Electronic Signature(s) Signed: 03/13/2022 9:43:36 AM By: Angelina Pih Entered By: Angelina Pih on 03/13/2022 09:43:36 [name] ([id number]) -------------------------------------------------------------------------------- Wound Assessment Details Patient Name: [name] Date of Service: 03/13/2022 8:00 AM Medical Record Number: [id number] Patient Account Number: [id number] Date of Birth/Sex: [birth year] (36 y.o. F) Treating RN: Angelina Pih Primary Care Trenna Kiely: Lynnell Chad Other Clinician: Referring Kayshaun Polanco: Lynnell Chad Treating Kalika Smay/Extender: Rowan Blase in Treatment: 6 Wound Status Wound Number: 1 Primary Etiology: Abscess Wound Location: Right Amputation Site - Below Knee Wound Status: Open Wounding Event: Gradually Appeared Comorbid History: Hypertension, Type II Diabetes Date Acquired: 01/14/2022 Weeks Of Treatment: 6 Clustered Wound: No Wound Measurements Length: (cm) 5 Width: (cm) 8 Depth: (cm) 2.4 Area: (cm) 31.416 Volume: (cm) 75.398 % Reduction in Area: -16.3% % Reduction in Volume: 28.4% Epithelialization: None Tunneling: No Undermining: No Wound Description Classification: Full Thickness With Exposed Support Structures Exudate Amount: Medium Exudate Type: Serosanguineous Exudate Color: red, brown Foul Odor After Cleansing: No Slough/Fibrino Yes Wound Bed Granulation Amount: Large (67-100%) Exposed Structure Granulation Quality: Red, Pink Fascia Exposed: No Necrotic Amount: Small (1-33%) Fat Layer (Subcutaneous Tissue) Exposed: Yes Tendon Exposed: No Muscle Exposed: No Joint Exposed: No Bone Exposed: No Treatment Notes Wound #1 (Amputation Site - Below Knee) Wound Laterality:  Right Cleanser Peri-Wound Care Topical Primary Dressing Secondary Dressing Secured With Compression Wrap Compression Stockings Add-Ons Electronic Signature(s) Signed: 03/13/2022 9:42:27 AM By: Angelina Pih Entered By: Angelina Pih on 03/13/2022 09:42:27

## 2022-03-13 NOTE — Progress Notes (Signed)
YITTEL, RACHFORD ([id number]) Visit Report for 03/13/2022 Physician Orders Details Patient Name: [name], [name] Date of Service: 03/13/2022 8:00 AM Medical Record Number: [id number] Patient Account Number: [id number] Date of Birth/Sex: [birth year] (36 y.o. F) Treating RN: Levora Dredge Primary Care Provider: Jac Canavan Other Clinician: Referring Provider: Jac Canavan Treating Provider/Extender: Skipper Cliche in Treatment: 6 Verbal / Phone Orders: No Diagnosis Coding Follow-up Appointments o Return Appointment in 1 week. o Nurse Visit as needed - thursday Bathing/ Shower/ Hygiene o No tub bath. Negative Pressure Wound Therapy o Wound VAC settings at 130mmHg continuous pressure. Use foam to wound cavity. Please order WHITE foam to fill any tunnel/s and/or undermining when necessary. Change VAC dressing 2 X WEEK. Change canister as indicated when full. o Number of foam/gauze pieces used in the dressing = - hydrofera blue to wound bed and area with depth , then 1 piece black foam , o Other: - wet to dry if wound vac malfunctions Wound Treatment Electronic Signature(s) Signed: 03/13/2022 9:44:09 AM By: Levora Dredge Signed: 03/13/2022 3:46:28 PM By: Worthy Keeler PA-C Entered By: Levora Dredge on 03/13/2022 09:44:08 [name] ([id number]) -------------------------------------------------------------------------------- SuperBill Details Patient Name: [name] Date of Service: 03/13/2022 Medical Record Number: [id number] Patient Account Number: [id number] Date of Birth/Sex: 1986-03-03 (35 y.o. 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 E11.43 Type 2 diabetes mellitus with diabetic autonomic (poly)neuropathy I10 Essential (primary) hypertension Facility Procedures CPT4 Code: [id number] Description: [id number] - WOUND VAC-50 SQ CM OR LESS Modifier: Quantity: 1 CPT4 Code: Description: ICD-10 Diagnosis Description T81.31XA Disruption of external operation (surgical) wound, not elsewhere classifie E11.622 Type 2 diabetes mellitus with other skin ulcer Modifier: d, initial encounter Quantity: Physician Procedures CPT4 [id number] Description: [id number] - WC PHYS TX WOUND VAC < 50 SQ CM Modifier: Quantity: 1 CPT4 Code: Description: ICD-10 Diagnosis Description T81.31XA Disruption of external operation (surgical) wound, not elsewhere classified, E11.622 Type 2 diabetes mellitus with other skin ulcer Modifier: initial encounter Quantity: Electronic Signature(s) Signed: 03/13/2022 9:45:27 AM By: Levora Dredge Signed: 03/13/2022 3:46:28 PM By: Worthy Keeler PA-C Entered By: Levora Dredge on 03/13/2022 09:45:26

## 2022-03-16 DIAGNOSIS — E11622 Type 2 diabetes mellitus with other skin ulcer: Secondary | ICD-10-CM | POA: Diagnosis not present

## 2022-03-16 NOTE — Progress Notes (Signed)
[name], [name] ([id number]) Visit Report for 03/16/2022 Arrival Information Details Patient Name: [name], [name] Date of Service: 03/16/2022 8:00 AM Medical Record Number: [id number] Patient Account Number: [id number] Date of Birth/Sex: 10-29-1985 (36 y.o. F) Treating RN: Angelina Pih Primary Care Dayna Alia: Lynnell Chad Other Clinician: Referring Cynitha Berte: Lynnell Chad Treating Ry Moody/Extender: Rowan Blase in Treatment: 6 Visit Information History Since Last Visit Added or deleted any medications: No Patient Arrived: Wheel Chair Any new allergies or adverse reactions: No Arrival Time: 08:02 Had a fall or experienced change in No Accompanied By: self activities of daily living that may affect Transfer Assistance: None risk of falls: Patient Identification Verified: Yes Hospitalized since last visit: No Secondary Verification Process Completed: Yes Has Dressing in Place as Prescribed: Yes Patient Requires Transmission-Based Precautions: No Pain Present Now: No Patient Has Alerts: Yes Patient Alerts: DIABETIC Electronic Signature(s) Signed: 03/16/2022 3:08:30 PM By: Angelina Pih Entered By: Angelina Pih on 03/16/2022 08:04:09 [name] ([id number]) -------------------------------------------------------------------------------- Clinic Level of Care Assessment Details Patient Name: [name] Date of Service: 03/16/2022 8:00 AM Medical Record Number: [id number] Patient Account Number: [id number] Date of Birth/Sex: Jan 27, 1986 (36 y.o. F) Treating RN: Angelina Pih Primary Care Jadarion Halbig: Lynnell Chad Other Clinician: Referring Jarrell Armond: Lynnell Chad Treating Juliona Vales/Extender: Rowan Blase in Treatment: 6 Clinic Level of Care Assessment Items TOOL 1 Quantity Score []  - Use when EandM and Procedure is performed on INITIAL visit 0 ASSESSMENTS - Nursing Assessment / Reassessment []  - General Physical Exam (combine w/ comprehensive assessment  (listed just below) when performed on new 0 pt. evals) []  - 0 Comprehensive Assessment (HX, ROS, Risk Assessments, Wounds Hx, etc.) ASSESSMENTS - Wound and Skin Assessment / Reassessment []  - Dermatologic / Skin Assessment (not related to wound area) 0 ASSESSMENTS - Ostomy and/or Continence Assessment and Care []  - Incontinence Assessment and Management 0 []  - 0 Ostomy Care Assessment and Management (repouching, etc.) PROCESS - Coordination of Care []  - Simple Patient / Family Education for ongoing care 0 []  - 0 Complex (extensive) Patient / Family Education for ongoing care []  - 0 Staff obtains , Records, Test Results / Process Orders []  - 0 Staff telephones HHA, Nursing Homes / Clarify orders / etc []  - 0 Routine Transfer to another Facility (non-emergent condition) []  - 0 Routine Hospital Admission (non-emergent condition) []  - 0 New Admissions / / Ordering NPWT, Apligraf, etc. []  - 0 Emergency Hospital Admission (emergent condition) PROCESS - Special Needs []  - Pediatric / Minor Patient Management 0 []  - 0 Isolation Patient Management []  - 0 Hearing / Language / Visual special needs []  - 0 Assessment of Community assistance (transportation, D/C planning, etc.) []  - 0 Additional assistance / Altered mentation []  - 0 Support Surface(s) Assessment (bed, cushion, seat, etc.) INTERVENTIONS - Miscellaneous []  - External ear exam 0 []  - 0 Patient Transfer (multiple staff / / Similar devices) []  - 0 Simple Staple / Suture removal (25 or less) []  - 0 Complex Staple / Suture removal (26 or more) []  - 0 Hypo/Hyperglycemic Management (do not check if billed separately) []  - 0 Ankle / Brachial Index (ABI) - do not check if billed separately Has the patient been seen at the hospital within the last three years: Yes Total Score: 0 Level Of Care: ____ ( ) Electronic Signature(s) Signed: 03/16/2022 3:08:30 PM  By: Entered By: on 03/16/2022 11:27:01 Chiropractor ( ) -------------------------------------------------------------------------------- Encounter Discharge Information Details Patient Name: Date of Service:  03/16/2022 8:00 AM Medical Record Number: [id number] Patient Account Number: [id number] Date of Birth/Sex: October 15, 1985 (36 y.o. F) Treating RN: Angelina Pih Primary Care Konnar Ben: Lynnell Chad Other Clinician: Referring Leshia Kope: Lynnell Chad Treating Jahleah Mariscal/Extender: Rowan Blase in Treatment: 6 Encounter Discharge Information Items Discharge Condition: Stable Ambulatory Status: Wheelchair Discharge Destination: Home Transportation: Private Auto Accompanied By: self Schedule Follow-up Appointment: Yes Clinical Summary of Care: Electronic Signature(s) Signed: 03/16/2022 11:26:54 AM By: Angelina Pih Entered By: Angelina Pih on 03/16/2022 11:26:54 [name] ([id number]) -------------------------------------------------------------------------------- Negative Pressure Wound Therapy Maintenance (NPWT) Details Patient Name: [name], [name] Date of Service: 03/16/2022 8:00 AM Medical Record Number: [id number] Patient Account Number: [id number] Date of Birth/Sex: 1985/12/11 (36 y.o. F) Treating RN: Angelina Pih Primary Care Sabella Traore: Lynnell Chad Other Clinician: Referring Kenric Ginger: Lynnell Chad Treating Brion Hedges/Extender: Rowan Blase in Treatment: 6 NPWT Maintenance Performed for: Wound #1 Right Amputation Site - Below Knee Performed By: Angelina Pih, RN Type: Other Coverage Size (sq cm): 40 Pressure Type: Constant Pressure Setting: 125 mmHG Drain Type: None Sponge/Dressing Type: Foam, Black Date Initiated: 01/23/2022 Dressing Removed: No Quantity of Sponges/Gauze Removed: 1 Canister Changed: No Dressing Reapplied: No Quantity of Sponges/Gauze Inserted: 1 Respones To Treatment:  tolerated well Days On NPWT: 53 Electronic Signature(s) Signed: 03/16/2022 11:26:11 AM By: Angelina Pih Entered By: Angelina Pih on 03/16/2022 11:26:11 [name] ([id number]) -------------------------------------------------------------------------------- Wound Assessment Details Patient Name: [name] Date of Service: 03/16/2022 8:00 AM Medical Record Number: [id number] Patient Account Number: [id number] Date of Birth/Sex: 16-Jan-1986 (35 y.o. F) Treating RN: Angelina Pih Primary Care Siddhartha Hoback: Lynnell Chad Other Clinician: Referring Goldman Birchall: Lynnell Chad Treating Eliezer Khawaja/Extender: Rowan Blase in Treatment: 6 Wound Status Wound Number: 1 Primary Etiology: Abscess Wound Location: Right Amputation Site - Below Knee Wound Status: Open Wounding Event: Gradually Appeared Comorbid History: Hypertension, Type II Diabetes Date Acquired: 01/14/2022 Weeks Of Treatment: 6 Clustered Wound: No Wound Measurements Length: (cm) 5 Width: (cm) 8 Depth: (cm) 2.4 Area: (cm) 31.416 Volume: (cm) 75.398 % Reduction in Area: -16.3% % Reduction in Volume: 28.4% Epithelialization: None Tunneling: No Undermining: No Wound Description Classification: Full Thickness With Exposed Support Structures Exudate Amount: Medium Exudate Type: Serosanguineous Exudate Color: red, brown Foul Odor After Cleansing: No Slough/Fibrino Yes Wound Bed Granulation Amount: Large (67-100%) Exposed Structure Granulation Quality: Red, Pink Fascia Exposed: No Necrotic Amount: Small (1-33%) Fat Layer (Subcutaneous Tissue) Exposed: Yes Tendon Exposed: No Muscle Exposed: No Joint Exposed: No Bone Exposed: No Treatment Notes Wound #1 (Amputation Site - Below Knee) Wound Laterality: Right Cleanser Peri-Wound Care Topical Primary Dressing Secondary Dressing Secured With Compression Wrap Compression Stockings Add-Ons Electronic Signature(s) Signed: 03/16/2022 3:08:30 PM By: Angelina Pih Entered By: Angelina Pih on 03/16/2022 08:04:27

## 2022-03-16 NOTE — Progress Notes (Signed)
NAMYA, VOGES ([id number]) Visit Report for 03/16/2022 Physician Orders Details Patient Name: [name], [name] Date of Service: 03/16/2022 8:00 AM Medical Record Number: [id number] Patient Account Number: [id number] Date of Birth/Sex: April 30, 1986 (36 y.o. F) Treating RN: Angelina Pih Primary Care Provider: Lynnell Chad Other Clinician: Referring Provider: Lynnell Chad Treating Provider/Extender: Rowan Blase in Treatment: 6 Verbal / Phone Orders: No Diagnosis Coding Follow-up Appointments o Return Appointment in 1 week. o Nurse Visit as needed - thursday Bathing/ Shower/ Hygiene o No tub bath. Negative Pressure Wound Therapy o Wound VAC settings at continuous pressure. Use foam to wound cavity. Please order WHITE foam to fill any tunnel/s and/or undermining when necessary. Change VAC dressing 2 X WEEK. Change canister as indicated when full. o Number of foam/gauze pieces used in the dressing = - hydrofera blue to wound bed and area with depth , then 1 piece black foam , o Other: - wet to dry if wound vac malfunctions Wound Treatment Electronic Signature(s) Signed: 03/16/2022 11:26:34 AM By: Angelina Pih Signed: 03/16/2022 4:11:01 PM By: Lenda Kelp PA-C Entered By: Angelina Pih on 03/16/2022 11:26:33 [name] ([id number]) -------------------------------------------------------------------------------- SuperBill Details Patient Name: [name] Date of Service: 03/16/2022 Medical Record Number: [id number] Patient Account Number: [id number] Date of Birth/Sex: 20-Oct-1985 (35 y.o. F) Treating RN: Angelina Pih Primary Care Provider: Lynnell Chad Other Clinician: Referring Provider: Lynnell Chad Treating Provider/Extender: Rowan Blase in Treatment: 6 Diagnosis Coding ICD-10 Codes Code Description E11.622 Type 2 diabetes mellitus with other skin ulcer T81.31XA Disruption of external operation (surgical) wound, not elsewhere  classified, initial encounter L97.812 Non-pressure chronic ulcer of other part of right lower leg with fat layer exposed E11.43 Type 2 diabetes mellitus with diabetic autonomic (poly)neuropathy I10 Essential (primary) hypertension Facility Procedures CPT4 Code: 34742595 Description: [phone] - WOUND VAC-50 SQ CM OR LESS Modifier: Quantity: 1 CPT4 Code: Description: ICD-10 Diagnosis Description T81.31XA Disruption of external operation (surgical) wound, not elsewhere classifie E11.622 Type 2 diabetes mellitus with other skin ulcer Modifier: d, initial encounter Quantity: Physician Procedures CPT4 Code: 6433295 Description: 97605 - WC PHYS TX WOUND VAC < 50 SQ CM Modifier: Quantity: 1 CPT4 Code: Description: ICD-10 Diagnosis Description T81.31XA Disruption of external operation (surgical) wound, not elsewhere classified, E11.622 Type 2 diabetes mellitus with other skin ulcer Modifier: initial encounter Quantity: Electronic Signature(s) Signed: 03/16/2022 11:27:11 AM By: Angelina Pih Signed: 03/16/2022 4:11:01 PM By: Lenda Kelp PA-C Entered By: Angelina Pih on 03/16/2022 11:27:11

## 2022-03-20 ENCOUNTER — Encounter: Payer: Medicaid Other | Admitting: Physician Assistant

## 2022-03-20 DIAGNOSIS — E11622 Type 2 diabetes mellitus with other skin ulcer: Secondary | ICD-10-CM | POA: Diagnosis not present

## 2022-03-22 NOTE — Progress Notes (Signed)
[name], [name] ([id number]) Visit Report for 03/20/2022 Arrival Information Details Patient Name: Allison, Howell Date of Service: 03/20/2022 8:00 AM Medical Record Number: [id number] Patient Account Number: [id number] Date of Birth/Sex: [birth year] (36 y.o. F) Treating RN: Yevonne Pax Primary Care Jeray Shugart: Lynnell Chad Other Clinician: Referring Marlita Keil: Lynnell Chad Treating Kia Stavros/Extender: Rowan Blase in Treatment: 7 Visit Information History Since Last Visit All ordered tests and consults were completed: No Patient Arrived: Wheel Chair Added or deleted any medications: No Arrival Time: 08:06 Any new allergies or adverse reactions: No Accompanied By: self Had a fall or experienced change in No Transfer Assistance: None activities of daily living that may affect Patient Identification Verified: Yes risk of falls: Secondary Verification Process Completed: Yes Signs or symptoms of abuse/neglect since last visito No Patient Requires Transmission-Based Precautions: No Hospitalized since last visit: No Patient Has Alerts: Yes Implantable device outside of the clinic excluding No Patient Alerts: DIABETIC cellular tissue based products placed in the center since last visit: Has Dressing in Place as Prescribed: Yes Pain Present Now: No Electronic Signature(s) Signed: 03/22/2022 2:40:31 PM By: Yevonne Pax RN Entered By: Yevonne Pax on 03/20/2022 08:10:14 [name] ([id number]) -------------------------------------------------------------------------------- Clinic Level of Care Assessment Details Patient Name: [name] Date of Service: 03/20/2022 8:00 AM Medical Record Number: [id number] Patient Account Number: [id number] Date of Birth/Sex: [birth year] (35 y.o. F) Treating RN: Yevonne Pax Primary Care Judianne Seiple: Lynnell Chad Other Clinician: Referring Jeren Dufrane: Lynnell Chad Treating Prajwal Fellner/Extender: Rowan Blase in Treatment: 7 Clinic Level of  Care Assessment Items TOOL 4 Quantity Score []  - Use when only an EandM is performed on FOLLOW-UP visit 0 ASSESSMENTS - Nursing Assessment / Reassessment []  - Reassessment of Co-morbidities (includes updates in patient status) 0 []  - 0 Reassessment of Adherence to Treatment Plan ASSESSMENTS - Wound and Skin Assessment / Reassessment []  - Simple Wound Assessment / Reassessment - one wound 0 []  - 0 Complex Wound Assessment / Reassessment - multiple wounds []  - 0 Dermatologic / Skin Assessment (not related to wound area) ASSESSMENTS - Focused Assessment []  - Circumferential Edema Measurements - multi extremities 0 []  - 0 Nutritional Assessment / Counseling / Intervention []  - 0 Lower Extremity Assessment (monofilament, tuning fork, pulses) []  - 0 Peripheral Arterial Disease Assessment (using hand held doppler) ASSESSMENTS - Ostomy and/or Continence Assessment and Care []  - Incontinence Assessment and Management 0 []  - 0 Ostomy Care Assessment and Management (repouching, etc.) PROCESS - Coordination of Care []  - Simple Patient / Family Education for ongoing care 0 []  - 0 Complex (extensive) Patient / Family Education for ongoing care []  - 0 Staff obtains , Records, Test Results / Process Orders []  - 0 Staff telephones HHA, Nursing Homes / Clarify orders / etc []  - 0 Routine Transfer to another Facility (non-emergent condition) []  - 0 Routine Hospital Admission (non-emergent condition) []  - 0 New Admissions / / Ordering NPWT, Apligraf, etc. []  - 0 Emergency Hospital Admission (emergent condition) []  - 0 Simple Discharge Coordination []  - 0 Complex (extensive) Discharge Coordination PROCESS - Special Needs []  - Pediatric / Minor Patient Management 0 []  - 0 Isolation Patient Management []  - 0 Hearing / Language / Visual special needs []  - 0 Assessment of Community assistance (transportation, D/C planning, etc.) []  - 0 Additional  assistance / Altered mentation []  - 0 Support Surface(s) Assessment (bed, cushion, seat, etc.) INTERVENTIONS - Wound Cleansing / Measurement [name], [name] ( ) []  - 0 Simple Wound Cleansing - one wound []  - 0  Complex Wound Cleansing - multiple wounds []  - 0 Wound Imaging (photographs - any number of wounds) []  - 0 Wound Tracing (instead of photographs) []  - 0 Simple Wound Measurement - one wound []  - 0 Complex Wound Measurement - multiple wounds INTERVENTIONS - Wound Dressings []  - Small Wound Dressing one or multiple wounds 0 []  - 0 Medium Wound Dressing one or multiple wounds []  - 0 Large Wound Dressing one or multiple wounds []  - 0 Application of Medications - topical []  - 0 Application of Medications - injection INTERVENTIONS - Miscellaneous []  - External ear exam 0 []  - 0 Specimen Collection (cultures, biopsies, blood, body fluids, etc.) []  - 0 Specimen(s) / Culture(s) sent or taken to Lab for analysis []  - 0 Patient Transfer (multiple staff / / Similar devices) []  - 0 Simple Staple / Suture removal (25 or less) []  - 0 Complex Staple / Suture removal (26 or more) []  - 0 Hypo / Hyperglycemic Management (close monitor of Blood Glucose) []  - 0 Ankle / Brachial Index (ABI) - do not check if billed separately []  - 0 Vital Signs Has the patient been seen at the hospital within the last three years: Yes Total Score: 0 Level Of Care: ____ Electronic Signature(s) Signed: 03/22/2022 2:40:31 PM By: RN Entered By: on 03/20/2022 08:25:44 ( ) -------------------------------------------------------------------------------- Encounter Discharge Information Details Patient Name: Date of Service: 03/20/2022 8:00 AM Medical Record Number: Patient Account Number: Date of Birth/Sex: 1986/02/07 (36 y.o. F) Treating RN: Nurse, adult Primary Care Clista Rainford: Other  Clinician: Referring Babita Amaker: Treating Onell Mcmath/Extender: in Treatment: 7 Encounter Discharge Information Items Discharge Condition: Stable Ambulatory Status: Wheelchair Discharge Destination: Home Transportation: Private Auto Accompanied By: self Schedule Follow-up Appointment: Yes Clinical Summary of Care: Electronic Signature(s) Signed: 03/22/2022 2:40:31 PM By: RN Entered By: 03/24/2022 on 03/20/2022 08:27:39 Yevonne Pax (03/22/2022) -------------------------------------------------------------------------------- Lower Extremity Assessment Details Patient Name: [name] Date of Service: 03/20/2022 8:00 AM Medical Record Number: [name] Patient Account Number: 03/22/2022 Date of Birth/Sex: 05/22/1986 (35 y.o. F) Treating RN: 09/17/1986 Primary Care Somtochukwu Woollard: 20 Other Clinician: Referring Tahmir Kleckner: Yevonne Pax Treating Trayvon Trumbull/Extender: Lynnell Chad in Treatment: 7 Electronic Signature(s) Signed: 03/22/2022 2:40:31 PM By: Rowan Blase RN Entered By: 03/24/2022 on 03/20/2022 08:16:50 Yevonne Pax (03/22/2022) -------------------------------------------------------------------------------- Multi Wound Chart Details Patient Name: [name] Date of Service: 03/20/2022 8:00 AM Medical Record Number: [name] Patient Account Number: 03/22/2022 Date of Birth/Sex: 09-11-1986 (35 y.o. F) Treating RN: 09/17/1986 Primary Care Sultan Pargas: 10-05-1973 Other Clinician: Referring Evalie Hargraves: Yevonne Pax Treating Milik Gilreath/Extender: Lynnell Chad in Treatment: 7 Vital Signs Height(in): 67 Pulse(bpm): 109 Weight(lbs): 219 Blood Pressure(mmHg): 129/82 Body Mass Index(BMI): 34.3 Temperature(F): 97.4 Respiratory Rate(breaths/min): 18 Photos: [N/A:N/A] Wound Location: Right Amputation Site - Below Knee N/A N/A Wounding Event: Gradually Appeared N/A N/A Primary Etiology: Abscess N/A  N/A Comorbid History: Hypertension, Type II Diabetes N/A N/A Date Acquired: 01/14/2022 N/A N/A Weeks of Treatment: 7 N/A N/A Wound Status: Open N/A N/A Wound Recurrence: No N/A N/A Measurements L x W x D (cm) 4.4x7x1.9 N/A N/A Area (cm) : 24.19 N/A N/A Volume (cm) : 45.962 N/A N/A % Reduction in Area: 10.50% N/A N/A % Reduction in Volume: 56.40% N/A N/A Classification: Full Thickness With Exposed N/A N/A Support Structures Exudate Amount: Medium N/A N/A Exudate Type: Serosanguineous N/A N/A Exudate Color: red, brown N/A N/A Granulation Amount: Large (67-100%) N/A  N/A Granulation Quality: Red, Pink N/A N/A Necrotic Amount: Small (1-33%) N/A N/A Exposed Structures: Fat Layer (Subcutaneous Tissue): N/A N/A Yes Fascia: No Tendon: No Muscle: No Joint: No Bone: No Epithelialization: None N/A N/A Treatment Notes Electronic Signature(s) Signed: 03/22/2022 2:40:31 PM By: Yevonne Pax RN Entered By: Yevonne Pax on 03/20/2022 08:17:13 [name] ([id number]) -------------------------------------------------------------------------------- Multi-Disciplinary Care Plan Details Patient Name: [name] Date of Service: 03/20/2022 8:00 AM Medical Record Number: [id number] Patient Account Number: [id number] Date of Birth/Sex: 1986/05/19 (35 y.o. F) Treating RN: Yevonne Pax Primary Care Jalecia Leon: Lynnell Chad Other Clinician: Referring Rodolfo Notaro: Lynnell Chad Treating Caridad Silveira/Extender: Rowan Blase in Treatment: 7 Active Inactive Wound/Skin Impairment Nursing Diagnoses: Knowledge deficit related to ulceration/compromised skin integrity Goals: Patient/caregiver will verbalize understanding of skin care regimen Date Initiated: 01/27/2022 Target Resolution Date: 02/26/2022 Goal Status: Active Ulcer/skin breakdown will have a volume reduction of 30% by week 4 Date Initiated: 01/27/2022 Target Resolution Date: 03/29/2022 Goal Status: Active Ulcer/skin breakdown will have  a volume reduction of 50% by week 8 Date Initiated: 01/27/2022 Target Resolution Date: 04/28/2022 Goal Status: Active Ulcer/skin breakdown will have a volume reduction of 80% by week 12 Date Initiated: 01/27/2022 Target Resolution Date: 05/29/2022 Goal Status: Active Ulcer/skin breakdown will heal within 14 weeks Date Initiated: 01/27/2022 Target Resolution Date: 06/29/2022 Goal Status: Active Interventions: Assess patient/caregiver ability to obtain necessary supplies Assess patient/caregiver ability to perform ulcer/skin care regimen upon admission and as needed Assess ulceration(s) every visit Notes: Electronic Signature(s) Signed: 03/22/2022 2:40:31 PM By: Yevonne Pax RN Entered By: Yevonne Pax on 03/20/2022 08:17:04 [name] ([id number]) -------------------------------------------------------------------------------- Negative Pressure Wound Therapy Maintenance (NPWT) Details Patient Name: [name], [name] Date of Service: 03/20/2022 8:00 AM Medical Record Number: [id number] Patient Account Number: [id number] Date of Birth/Sex: 10-08-1985 (36 y.o. F) Treating RN: Yevonne Pax Primary Care Kasidee Voisin: Lynnell Chad Other Clinician: Referring Angle Karel: Lynnell Chad Treating Arno Cullers/Extender: Rowan Blase in Treatment: 7 NPWT Maintenance Performed for: Wound #1 Right Amputation Site - Below Knee Performed By: Yevonne Pax, RN Type: Other Coverage Size (sq cm): 30.8 Pressure Type: Constant Pressure Setting: 125 mmHG Drain Type: None Sponge/Dressing Type: Foam, Black Date Initiated: 01/23/2022 Dressing Removed: No Quantity of Sponges/Gauze Removed: hydrofera blue and black foam Canister Changed: No Dressing Reapplied: No Quantity of Sponges/Gauze Inserted: 2 Days On NPWT: 57 Post Procedure Diagnosis Same as Pre-procedure Electronic Signature(s) Signed: 03/22/2022 2:40:31 PM By: Yevonne Pax RN Entered By: Yevonne Pax on 03/20/2022 08:26:52 [name]  ([id number]) -------------------------------------------------------------------------------- Pain Assessment Details Patient Name: [name] Date of Service: 03/20/2022 8:00 AM Medical Record Number: [id number] Patient Account Number: [id number] Date of Birth/Sex: 1986-06-25 (35 y.o. F) Treating RN: Yevonne Pax Primary Care Paizley Ramella: Lynnell Chad Other Clinician: Referring Lossie Kalp: Lynnell Chad Treating Myca Perno/Extender: Rowan Blase in Treatment: 7 Active Problems Location of Pain Severity and Description of Pain Patient Has Paino No Site Locations Pain Management and Medication Current Pain Management: Electronic Signature(s) Signed: 03/22/2022 2:40:31 PM By: Yevonne Pax RN Entered By: Yevonne Pax on 03/20/2022 08:10:43 [name] ([id number]) -------------------------------------------------------------------------------- Patient/Caregiver Education Details Patient Name: [name] Date of Service: 03/20/2022 8:00 AM Medical Record Number: [id number] Patient Account Number: [id number] Date of Birth/Gender: [birth year] (35 y.o. F) Treating RN: Yevonne Pax Primary Care Physician: Lynnell Chad Other Clinician: Referring Physician: Lynnell Chad Treating Physician/Extender: Rowan Blase in Treatment: 7 Education Assessment Education Provided To: Patient Education Topics Provided Wound/Skin Impairment: Methods: Explain/Verbal Responses: State content correctly Electronic Signature(s) Signed: 03/22/2022 2:40:31 PM By: Yevonne Pax RN Entered By:  Yevonne Pax on 03/20/2022 08:27:09 [name], [name] ([id number]) -------------------------------------------------------------------------------- Wound Assessment Details Patient Name: [name], [name] Date of Service: 03/20/2022 8:00 AM Medical Record Number: [id number] Patient Account Number: [id number] Date of Birth/Sex: 1986/03/03 (35 y.o. F) Treating RN: Yevonne Pax Primary Care Clydie Dillen: Lynnell Chad Other Clinician: Referring Ozella Comins: Lynnell Chad Treating Toua Stites/Extender: Rowan Blase in Treatment: 7 Wound Status Wound Number: 1 Primary Etiology: Abscess Wound Location: Right Amputation Site - Below Knee Wound Status: Open Wounding Event: Gradually Appeared Comorbid History: Hypertension, Type II Diabetes Date Acquired: 01/14/2022 Weeks Of Treatment: 7 Clustered Wound: No Photos Wound Measurements Length: (cm) 4.4 Width: (cm) 7 Depth: (cm) 1.9 Area: (cm) 24.19 Volume: (cm) 45.962 % Reduction in Area: 10.5% % Reduction in Volume: 56.4% Epithelialization: None Tunneling: No Undermining: No Wound Description Classification: Full Thickness With Exposed Support Structures Exudate Amount: Medium Exudate Type: Serosanguineous Exudate Color: red, brown Foul Odor After Cleansing: No Slough/Fibrino Yes Wound Bed Granulation Amount: Large (67-100%) Exposed Structure Granulation Quality: Red, Pink Fascia Exposed: No Necrotic Amount: Small (1-33%) Fat Layer (Subcutaneous Tissue) Exposed: Yes Tendon Exposed: No Muscle Exposed: No Joint Exposed: No Bone Exposed: No Treatment Notes Wound #1 (Amputation Site - Below Knee) Wound Laterality: Right Cleanser Peri-Wound Care Topical Primary Dressing [name], [name] ([id number]) Secondary Dressing Secured With Compression Wrap Compression Stockings Add-Ons Electronic Signature(s) Signed: 03/22/2022 2:40:31 PM By: Yevonne Pax RN Entered By: Yevonne Pax on 03/20/2022 08:16:40 [name] ([id number]) -------------------------------------------------------------------------------- Vitals Details Patient Name: [name] Date of Service: 03/20/2022 8:00 AM Medical Record Number: [id number] Patient Account Number: [id number] Date of Birth/Sex: Feb 06, 1986 (35 y.o. F) Treating RN: Yevonne Pax Primary Care Emmons Toth: Lynnell Chad Other Clinician: Referring Jeret Goyer: Lynnell Chad Treating  Chevie Birkhead/Extender: Rowan Blase in Treatment: 7 Vital Signs Time Taken: 08:10 Temperature (F): 97.4 Height (in): 67 Pulse (bpm): 109 Weight (lbs): 219 Respiratory Rate (breaths/min): 18 Body Mass Index (BMI): 34.3 Blood Pressure (mmHg): 129/82 Reference Range: 80 - 120 mg / dl Electronic Signature(s) Signed: 03/22/2022 2:40:31 PM By: Yevonne Pax RN Entered By: Yevonne Pax on 03/20/2022 08:10:33

## 2022-03-22 NOTE — Progress Notes (Signed)
TEDRA, COPPERNOLL ([id number]) Visit Report for 03/20/2022 Chief Complaint Document Details Patient Name: [name], [name] Date of Service: 03/20/2022 8:00 AM Medical Record Number: [id number] Patient Account Number: [id number] Date of Birth/Sex: 01-21-1986 (36 y.o. F) Treating RN: Yevonne Pax Primary Care Provider: Lynnell Chad Other Clinician: Referring Provider: Lynnell Chad Treating Provider/Extender: Rowan Blase in Treatment: 7 Information Obtained from: Patient Chief Complaint Right BKA Amputation Site Disruption Electronic Signature(s) Signed: 03/22/2022 8:55:04 AM By: Lenda Kelp PA-C Entered By: Lenda Kelp on 03/20/2022 08:20:26 [name] ([id number]) -------------------------------------------------------------------------------- HPI Details Patient Name: [name] Date of Service: 03/20/2022 8:00 AM Medical Record Number: [id number] Patient Account Number: [id number] Date of Birth/Sex: 09-22-1986 (36 y.o. F) Treating RN: Yevonne Pax Primary Care Provider: Lynnell Chad Other Clinician: Referring Provider: Lynnell Chad Treating Provider/Extender: Rowan Blase in Treatment: 7 History of Present Illness HPI Description: 01-27-2022 upon evaluation today patient appears to be doing somewhat poorly in regard to her right below-knee amputation. This unfortunately developed a abscess internally following the initial amputation which was January 2023. Subsequently she was getting very close to starting to work on the prosthesis when she had an area that somewhat bulged out ended up being an abscess and the incision and drainage was performed April 22. Subsequently she did have antibiotic beads placed in a wound VAC after this was cleared out. Her most recent reported hemoglobin A1c was 6.4. With that being said the patient tells me that her blood sugars seem to be under pretty good control as best she can tell at this point. She also tells me that the  wound VAC seems to be functioning appropriately although upon evaluation the did not appear to be anything tucked into the region of undermining and depth in the roughly 9-12 o'clock location based on what we saw today. This is also where the antibiotic beads are and I can see those in place. I do believe that she is getting need to have the wound VAC tucked down into this location. Patient does have a history of diabetes mellitus type 2, diabetic neuropathy, and hypertension. This initial amputation occurred as a result of her having sewing needles that actually went into her heel she did not know about it started having trouble with her heel and ended up with a significant infection which was stated to be necrotizing and she subsequently had to have emergency amputation. 02-13-2022 upon evaluation today patient appears to be doing well with regard to her wound. She has been tolerating the dressing changes without complication. The wound VAC does seem to be doing a great job. Fortunately I do not see any evidence of active infection locally or systemically at this time which is great news. She still has some of the antibiotic beads in the base of the wound. 02-20-2022 upon evaluation today patient appears to be doing well currently. There does not appear to be any evidence of active infection locally nor systemically which is great news and overall I am extremely pleased with where we stand today. I do believe that the wound VAC is doing a good job here and the color of the drainage is improved again were unsure as to whether this may have been the antibiotic beads or if there was something such as Pseudomonas causing the greenish discoloration to the drainage either way she is doing significantly better. 03-06-2022 upon evaluation today patient appears to be doing well with regard to her wound. The only issue I see is that she does have  a little bit of a pocket of fluid that collected in the deeper  section everything else is actually measuring smaller and looks better. This was a point where I really did expect this was been a turnaround and start doing a lot better. I think we are especially seeing that right now. 03-20-2022 upon evaluation today patient appears to be doing well with regard to her wound in fact this is actually significantly improved compared to last time I saw her. Fortunately I do not see any evidence of active infection at this time. No fevers, chills, nausea, vomiting, or diarrhea. Electronic Signature(s) Signed: 03/20/2022 8:33:17 AM By: Lenda Kelp PA-C Entered By: Lenda Kelp on 03/20/2022 08:33:17 [name] ([id number]) -------------------------------------------------------------------------------- Physical Exam Details Patient Name: [name] Date of Service: 03/20/2022 8:00 AM Medical Record Number: [id number] Patient Account Number: [id number] Date of Birth/Sex: [birth year] (36 y.o. F) Treating RN: Yevonne Pax Primary Care Provider: Lynnell Chad Other Clinician: Referring Provider: Lynnell Chad Treating Provider/Extender: Rowan Blase in Treatment: 7 Constitutional Well-nourished and well-hydrated in no acute distress. Respiratory normal breathing without difficulty. Psychiatric this patient is able to make decisions and demonstrates good insight into disease process. Alert and Oriented x 3. pleasant and cooperative. Notes Upon inspection patient's wound bed actually showed signs of good granulation and epithelization at this point. She is actually making excellent progress and overall I am extremely pleased with where we stand today. Electronic Signature(s) Signed: 03/20/2022 8:33:31 AM By: Lenda Kelp PA-C Entered By: Lenda Kelp on 03/20/2022 08:33:31 [name] ([id number]) -------------------------------------------------------------------------------- Physician Orders Details Patient Name: [name] Date of Service: 03/20/2022 8:00 AM Medical Record Number: [id number] Patient Account Number: [id number] Date of Birth/Sex: 1986/03/31 (36 y.o. F) Treating RN: Yevonne Pax Primary Care Provider: Lynnell Chad Other Clinician: Referring Provider: Lynnell Chad Treating Provider/Extender: Rowan Blase in Treatment: 7 Verbal / Phone Orders: No Diagnosis Coding ICD-10 Coding Code Description E11.622 Type 2 diabetes mellitus with other skin ulcer T81.31XA Disruption of external operation (surgical) wound, not elsewhere classified, initial encounter L97.812 Non-pressure chronic ulcer of other part of right lower leg with fat layer exposed E11.43 Type 2 diabetes mellitus with diabetic autonomic (poly)neuropathy I10 Essential (primary) hypertension Follow-up Appointments o Return Appointment in 1 week. o Nurse Visit as needed - thursday Bathing/ Shower/ Hygiene o No tub bath. Negative Pressure Wound Therapy o Wound VAC settings at continuous pressure. Use foam to wound cavity. Please order WHITE foam to fill any tunnel/s and/or undermining when necessary. Change VAC dressing 2 X WEEK. Change canister as indicated when full. o Number of foam/gauze pieces used in the dressing = - hydrofera blue to wound bed and area with depth , then 1 piece black foam , o Other: - wet to dry if wound vac malfunctions Wound Treatment Electronic Signature(s) Signed: 03/22/2022 8:55:04 AM By: Lenda Kelp PA-C Signed: 03/22/2022 2:40:31 PM By: Yevonne Pax RN Entered By: Yevonne Pax on 03/20/2022 08:25:30 [name] ([id number]) -------------------------------------------------------------------------------- Problem List Details Patient Name: [name] Date of Service: 03/20/2022 8:00 AM Medical Record Number: [id number] Patient Account Number: [id number] Date of Birth/Sex: June 23, 1986 (36 y.o. F) Treating RN: Yevonne Pax Primary Care Provider: Lynnell Chad  Other Clinician: Referring Provider: Lynnell Chad Treating Provider/Extender: Rowan Blase in Treatment: 7 Active Problems ICD-10 Encounter Code Description Active Date MDM Diagnosis E11.622 Type 2 diabetes mellitus with other skin ulcer 01/27/2022 No Yes T81.31XA Disruption of external operation (surgical) wound, not elsewhere 01/27/2022 No Yes classified,  initial encounter L97.812 Non-pressure chronic ulcer of other part of right lower leg with fat layer 01/27/2022 No Yes exposed E11.43 Type 2 diabetes mellitus with diabetic autonomic (poly)neuropathy 01/27/2022 No Yes I10 Essential (primary) hypertension 01/27/2022 No Yes Inactive Problems Resolved Problems Electronic Signature(s) Signed: 03/22/2022 8:55:04 AM By: Lenda KelpStone III, Leomar Westberg PA-C Entered By: Lenda KelpStone III, Charlann Wayne on 03/20/2022 08:20:11 Allison RotaHYLTON, Mirayah ([id number]) -------------------------------------------------------------------------------- Progress Note Details Patient Name: Allison RotaHYLTON, Chrissi Date of Service: 03/20/2022 8:00 AM Medical Record Number: [id number] Patient Account Number: [id number] Date of Birth/Sex: [birth year] (36 y.o. F) Treating RN: Yevonne PaxEpps, Carrie Primary Care Provider: Lynnell ChadIjaola, Onyeje Other Clinician: Referring Provider: Lynnell ChadIjaola, Onyeje Treating Provider/Extender: Rowan BlaseStone, Kamdyn Covel Weeks in Treatment: 7 Subjective Chief Complaint Information obtained from Patient Right BKA Amputation Site Disruption History of Present Illness (HPI) 01-27-2022 upon evaluation today patient appears to be doing somewhat poorly in regard to her right below-knee amputation. This unfortunately developed a abscess internally following the initial amputation which was January 2023. Subsequently she was getting very close to starting to work on the prosthesis when she had an area that somewhat bulged out ended up being an abscess and the incision and drainage was performed April 22. Subsequently she did have antibiotic beads placed in a wound  VAC after this was cleared out. Her most recent reported hemoglobin A1c was 6.4. With that being said the patient tells me that her blood sugars seem to be under pretty good control as best she can tell at this point. She also tells me that the wound VAC seems to be functioning appropriately although upon evaluation the did not appear to be anything tucked into the region of undermining and depth in the roughly 9-12 o'clock location based on what we saw today. This is also where the antibiotic beads are and I can see those in place. I do believe that she is getting need to have the wound VAC tucked down into this location. Patient does have a history of diabetes mellitus type 2, diabetic neuropathy, and hypertension. This initial amputation occurred as a result of her having sewing needles that actually went into her heel she did not know about it started having trouble with her heel and ended up with a significant infection which was stated to be necrotizing and she subsequently had to have emergency amputation. 02-13-2022 upon evaluation today patient appears to be doing well with regard to her wound. She has been tolerating the dressing changes without complication. The wound VAC does seem to be doing a great job. Fortunately I do not see any evidence of active infection locally or systemically at this time which is great news. She still has some of the antibiotic beads in the base of the wound. 02-20-2022 upon evaluation today patient appears to be doing well currently. There does not appear to be any evidence of active infection locally nor systemically which is great news and overall I am extremely pleased with where we stand today. I do believe that the wound VAC is doing a good job here and the color of the drainage is improved again were unsure as to whether this may have been the antibiotic beads or if there was something such as Pseudomonas causing the greenish discoloration to the drainage  either way she is doing significantly better. 03-06-2022 upon evaluation today patient appears to be doing well with regard to her wound. The only issue I see is that she does have a little bit of a pocket of fluid that collected in the deeper section  everything else is actually measuring smaller and looks better. This was a point where I really did expect this was been a turnaround and start doing a lot better. I think we are especially seeing that right now. 03-20-2022 upon evaluation today patient appears to be doing well with regard to her wound in fact this is actually significantly improved compared to last time I saw her. Fortunately I do not see any evidence of active infection at this time. No fevers, chills, nausea, vomiting, or diarrhea. Objective Constitutional Well-nourished and well-hydrated in no acute distress. Vitals Time Taken: 8:10 AM, Height: 67 in, Weight: 219 lbs, BMI: 34.3, Temperature: 97.4 F, Pulse: 109 bpm, Respiratory Rate: 18 breaths/min, Blood Pressure: 129/82 mmHg. Respiratory normal breathing without difficulty. Psychiatric this patient is able to make decisions and demonstrates good insight into disease process. Alert and Oriented x 3. pleasant and cooperative. General Notes: Upon inspection patient's wound bed actually showed signs of good granulation and epithelization at this point. She is actually making excellent progress and overall I am extremely pleased with where we stand today. Integumentary (Hair, Skin) Bartling, Adelin ([id number]) Wound #1 status is Open. Original cause of wound was Gradually Appeared. The date acquired was: 01/14/2022. The wound has been in treatment 7 weeks. The wound is located on the Right Amputation Site - Below Knee. The wound measures 4.4cm length x 7cm width x 1.9cm depth; 24.19cm^2 area and 45.962cm^3 volume. There is Fat Layer (Subcutaneous Tissue) exposed. There is no tunneling or undermining noted. There is a medium amount of  serosanguineous drainage noted. There is large (67-100%) red, pink granulation within the wound bed. There is a small (1-33%) amount of necrotic tissue within the wound bed. 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 Nurse Visit as needed - thursday Bathing/ Shower/ Hygiene: No tub bath. Negative Pressure Wound Therapy: Wound VAC settings at continuous pressure. Use foam to wound cavity. Please order WHITE foam to fill any tunnel/s and/or undermining when necessary. Change VAC dressing 2 X WEEK. Change canister as indicated when full. Number of foam/gauze pieces used in the dressing = - hydrofera blue to wound bed and area with depth , then 1 piece black foam , Other: - wet to dry if wound vac malfunctions 1. I would recommend that we go ahead and continue with the wound care measures as before and the patient is in agreement the plan this includes the use of the [facility] into the deeper tunnel area which is definitely improving compared to last time I saw her this is a significant change. 2. Also can recommend that we continue with the wound VAC which I do believe is doing an awesome job for her. We will see patient back for reevaluation in 1 week here in the clinic. If anything worsens or changes patient will contact our office for additional recommendations. Electronic Signature(s) Signed: 03/20/2022 8:34:06 AM By: Lenda Kelp PA-C Entered By: Lenda Kelp on 03/20/2022 08:34:06 [name] ([id number]) -------------------------------------------------------------------------------- SuperBill  Details Patient Name: [name] Date of Service: 03/20/2022 Medical Record Number: [id number] Patient Account Number: [id number] Date of Birth/Sex: Dec 24, 1985 (36 y.o. F) Treating RN: Yevonne Pax Primary Care Provider: Lynnell Chad Other Clinician: Referring  Provider: Lynnell Chad Treating Provider/Extender: Rowan Blase in Treatment: 7 Diagnosis Coding ICD-10 Codes Code Description E11.622 Type 2 diabetes mellitus with other skin ulcer T81.31XA Disruption of external operation (surgical) wound, not elsewhere classified, initial encounter L97.812 Non-pressure chronic ulcer of other part of right lower leg with fat layer exposed E11.43 Type 2 diabetes mellitus with diabetic autonomic (poly)neuropathy I10 Essential (primary) hypertension Facility Procedures CPT4 Code: [id number] Description: [id number] - WOUND VAC-50 SQ CM OR LESS Modifier: Quantity: 1 Physician Procedures CPT4 Code: 1324401 Description: 99214 - WC PHYS LEVEL 4 - EST PT Modifier: Quantity: 1 CPT4 Code: Description: ICD-10 Diagnosis Description E11.622 Type 2 diabetes mellitus with other skin ulcer T81.31XA Disruption of external operation (surgical) wound, not elsewhere classifi L97.812 Non-pressure chronic ulcer of other part of right lower leg with  fat laye E11.43 Type 2 diabetes mellitus with diabetic autonomic (poly)neuropathy Modifier: ed, initial encounter r exposed Quantity: Electronic Signature(s) Signed: 03/20/2022 8:34:50 AM By: Lenda Kelp PA-C Entered By: Lenda Kelp on 03/20/2022 08:34:50

## 2022-03-23 DIAGNOSIS — E11622 Type 2 diabetes mellitus with other skin ulcer: Secondary | ICD-10-CM | POA: Diagnosis not present

## 2022-03-23 NOTE — Progress Notes (Signed)
SARAJANE, FAMBROUGH ([id number]) Visit Report for 03/23/2022 Arrival Information Details Patient Name: Allison, Howell Date of Service: 03/23/2022 8:00 AM Medical Record Number: [id number] Patient Account Number: [id number] Date of Birth/Sex: 1986-03-23 (36 y.o. F) Treating RN: Angelina Pih Primary Care Ajooni Karam: Lynnell Chad Other Clinician: Referring Geraldo Haris: Lynnell Chad Treating Raed Schalk/Extender: Rowan Blase in Treatment: 7 Visit Information History Since Last Visit Added or deleted any medications: No Patient Arrived: Wheel Chair Any new allergies or adverse reactions: No Arrival Time: 12:56 Had a fall or experienced change in No Accompanied By: self activities of daily living that may affect Transfer Assistance: None risk of falls: Patient Identification Verified: Yes Hospitalized since last visit: No Secondary Verification Process Completed: Yes Has Dressing in Place as Prescribed: Yes Patient Requires Transmission-Based Precautions: No Pain Present Now: No Patient Has Alerts: Yes Patient Alerts: DIABETIC Electronic Signature(s) Signed: 03/23/2022 12:56:20 PM By: Angelina Pih Entered By: Angelina Pih on 03/23/2022 12:56:20 [name] ([id number]) -------------------------------------------------------------------------------- Clinic Level of Care Assessment Details Patient Name: [name] Date of Service: 03/23/2022 8:00 AM Medical Record Number: [id number] Patient Account Number: [id number] Date of Birth/Sex: 05/24/1986 (35 y.o. F) Treating RN: Angelina Pih Primary Care Adri Schloss: Lynnell Chad Other Clinician: Referring Laiyla Slagel: Lynnell Chad Treating Andriy Sherk/Extender: Rowan Blase in Treatment: 7 Clinic Level of Care Assessment Items TOOL 1 Quantity Score []  - Use when EandM and Procedure is performed on INITIAL visit 0 ASSESSMENTS - Nursing Assessment / Reassessment []  - General Physical Exam (combine w/ comprehensive assessment  (listed just below) when performed on new 0 pt. evals) []  - 0 Comprehensive Assessment (HX, ROS, Risk Assessments, Wounds Hx, etc.) ASSESSMENTS - Wound and Skin Assessment / Reassessment []  - Dermatologic / Skin Assessment (not related to wound area) 0 ASSESSMENTS - Ostomy and/or Continence Assessment and Care []  - Incontinence Assessment and Management 0 []  - 0 Ostomy Care Assessment and Management (repouching, etc.) PROCESS - Coordination of Care []  - Simple Patient / Family Education for ongoing care 0 []  - 0 Complex (extensive) Patient / Family Education for ongoing care []  - 0 Staff obtains , Records, Test Results / Process Orders []  - 0 Staff telephones HHA, Nursing Homes / Clarify orders / etc []  - 0 Routine Transfer to another Facility (non-emergent condition) []  - 0 Routine Hospital Admission (non-emergent condition) []  - 0 New Admissions / / Ordering NPWT, Apligraf, etc. []  - 0 Emergency Hospital Admission (emergent condition) PROCESS - Special Needs []  - Pediatric / Minor Patient Management 0 []  - 0 Isolation Patient Management []  - 0 Hearing / Language / Visual special needs []  - 0 Assessment of Community assistance (transportation, D/C planning, etc.) []  - 0 Additional assistance / Altered mentation []  - 0 Support Surface(s) Assessment (bed, cushion, seat, etc.) INTERVENTIONS - Miscellaneous []  - External ear exam 0 []  - 0 Patient Transfer (multiple staff / / Similar devices) []  - 0 Simple Staple / Suture removal (25 or less) []  - 0 Complex Staple / Suture removal (26 or more) []  - 0 Hypo/Hyperglycemic Management (do not check if billed separately) []  - 0 Ankle / Brachial Index (ABI) - do not check if billed separately Has the patient been seen at the hospital within the last three years: Yes Total Score: 0 Level Of Care: ____ ( ) Electronic Signature(s) Unsigned Entered By:  on 03/23/2022 12:57:46 Signature(s): Date(s): ( ) -------------------------------------------------------------------------------- Encounter Discharge Information Details Patient Name: Chiropractor Date of Service: 03/23/2022 8:00 AM Medical  Record Number: [id number] Patient Account Number: [id number] Date of Birth/Sex: 05-Mar-1986 (35 y.o. F) Treating RN: Angelina Pih Primary Care Josejuan Hoaglin: Lynnell Chad Other Clinician: Referring Dodd Schmid: Lynnell Chad Treating Izabella Marcantel/Extender: Rowan Blase in Treatment: 7 Encounter Discharge Information Items Discharge Condition: Stable Ambulatory Status: Wheelchair Discharge Destination: Home Transportation: Private Auto Accompanied By: self Schedule Follow-up Appointment: Yes Clinical Summary of Care: Patient Declined Electronic Signature(s) Signed: 03/23/2022 12:57:40 PM By: Angelina Pih Entered By: Angelina Pih on 03/23/2022 12:57:40 [name] ([id number]) -------------------------------------------------------------------------------- Negative Pressure Wound Therapy Maintenance (NPWT) Details Patient Name: Allison, Howell Date of Service: 03/23/2022 8:00 AM Medical Record Number: [id number] Patient Account Number: [id number] Date of Birth/Sex: [birth year] (35 y.o. F) Treating RN: Angelina Pih Primary Care Ermina Oberman: Lynnell Chad Other Clinician: Referring Kasch Borquez: Lynnell Chad Treating Jenny Lai/Extender: Rowan Blase in Treatment: 7 NPWT Maintenance Performed for: Wound #1 Right Amputation Site - Below Knee Performed By: Angelina Pih, RN Type: Other Coverage Size (sq cm): 30.8 Pressure Type: Constant Pressure Setting: 125 mmHG Drain Type: None Sponge/Dressing Type: Foam, Black Date Initiated: 01/23/2022 Dressing Removed: No Quantity of Sponges/Gauze Removed: 1 Canister Changed: No Dressing Reapplied: No Quantity of Sponges/Gauze Inserted: 1 Respones To  Treatment: tolerated well Days On NPWT: 60 Electronic Signature(s) Signed: 03/23/2022 12:57:04 PM By: Angelina Pih Entered By: Angelina Pih on 03/23/2022 12:57:04 [name] ([id number]) -------------------------------------------------------------------------------- Wound Assessment Details Patient Name: [name] Date of Service: 03/23/2022 8:00 AM Medical Record Number: [id number] Patient Account Number: [id number] Date of Birth/Sex: 1986-08-27 (35 y.o. F) Treating RN: Angelina Pih Primary Care Biff Rutigliano: Lynnell Chad Other Clinician: Referring Laird Runnion: Lynnell Chad Treating Lazara Grieser/Extender: Rowan Blase in Treatment: 7 Wound Status Wound Number: 1 Primary Etiology: Abscess Wound Location: Right Amputation Site - Below Knee Wound Status: Open Wounding Event: Gradually Appeared Comorbid History: Hypertension, Type II Diabetes Date Acquired: 01/14/2022 Weeks Of Treatment: 7 Clustered Wound: No Wound Measurements Length: (cm) 4.4 Width: (cm) 7 Depth: (cm) 1.9 Area: (cm) 24.19 Volume: (cm) 45.962 % Reduction in Area: 10.5% % Reduction in Volume: 56.4% Epithelialization: None Tunneling: No Undermining: No Wound Description Classification: Full Thickness With Exposed Support Structures Exudate Amount: Medium Exudate Type: Serosanguineous Exudate Color: red, brown Foul Odor After Cleansing: No Slough/Fibrino Yes Wound Bed Granulation Amount: Large (67-100%) Exposed Structure Granulation Quality: Red, Pink Fascia Exposed: No Necrotic Amount: Small (1-33%) Fat Layer (Subcutaneous Tissue) Exposed: Yes Tendon Exposed: No Muscle Exposed: No Joint Exposed: No Bone Exposed: No Treatment Notes Wound #1 (Amputation Site - Below Knee) Wound Laterality: Right Cleanser Peri-Wound Care Topical Primary Dressing Secondary Dressing Secured With Compression Wrap Compression Stockings Add-Ons Electronic Signature(s) Signed: 03/23/2022 12:56:34 PM  By: Angelina Pih Entered By: Angelina Pih on 03/23/2022 12:56:34

## 2022-03-27 ENCOUNTER — Encounter: Payer: Medicaid Other | Admitting: Physician Assistant

## 2022-03-27 ENCOUNTER — Other Ambulatory Visit: Payer: Self-pay | Admitting: Nurse Practitioner

## 2022-03-27 DIAGNOSIS — R110 Nausea: Secondary | ICD-10-CM

## 2022-03-27 DIAGNOSIS — E1165 Type 2 diabetes mellitus with hyperglycemia: Secondary | ICD-10-CM

## 2022-03-27 DIAGNOSIS — E11622 Type 2 diabetes mellitus with other skin ulcer: Secondary | ICD-10-CM | POA: Diagnosis not present

## 2022-03-27 MED ORDER — NIFEDIPINE ER OSMOTIC RELEASE 30 MG PO TB24: 30.0000 mg | ORAL_TABLET | Freq: Every day | ORAL | 0 refills | Status: DC

## 2022-03-27 NOTE — Telephone Encounter (Signed)
Gabapentin 300MG  3x day - not on current med list  OZEMPIC, 3MG -pt will need to increase on Thursday to 3MG   ondansetron (ZOFRAN) 4 MG tablet - Last RF 02/10/22  NOVOLOG FLEXPEN 100 UNIT/ML FlexPen - Historical med   insulin glargine (LANTUS SOLOSTAR) 100 UNIT/ML Solostar Pen - Historical med Next OV not sched

## 2022-03-29 MED ORDER — NOVOLOG FLEXPEN RELION 100 UNIT/ML ~~LOC~~ SOPN: 5.0000 [IU] | PEN_INJECTOR | Freq: Three times a day (TID) | SUBCUTANEOUS | 1 refills | Status: DC

## 2022-03-29 MED ORDER — ONDANSETRON HCL 4 MG PO TABS: 4.0000 mg | ORAL_TABLET | Freq: Three times a day (TID) | ORAL | 0 refills | Status: DC | PRN

## 2022-03-29 MED ORDER — OZEMPIC (1 MG/DOSE) 4 MG/3ML ~~LOC~~ SOPN: PEN_INJECTOR | SUBCUTANEOUS | 0 refills | Status: DC

## 2022-03-29 MED ORDER — LANTUS SOLOSTAR 100 UNIT/ML ~~LOC~~ SOPN: 15.0000 [IU] | PEN_INJECTOR | Freq: Every day | SUBCUTANEOUS | 1 refills | Status: DC

## 2022-03-29 NOTE — Telephone Encounter (Signed)
Patient is waiting to hear about her refills and if she needs appt. Please call.

## 2022-03-30 DIAGNOSIS — E11622 Type 2 diabetes mellitus with other skin ulcer: Secondary | ICD-10-CM | POA: Diagnosis not present

## 2022-03-30 NOTE — Telephone Encounter (Signed)
Pt wants to make sure that JE addresses the medication refill for Gabapentin 300MG  3x day. Pt says that her vascular surgeon prescribed first and is asking for refill from her pcp. She is completely out. Pt aware JE is off today and will address tomorrow. Please call back

## 2022-03-30 NOTE — Progress Notes (Signed)
JACQUALINE, WEICHEL ([id number]) Visit Report for 03/30/2022 Arrival Information Details Patient Name: [name], [name] Date of Service: 03/30/2022 8:00 AM Medical Record Number: [id number] Patient Account Number: [id number] Date of Birth/Sex: December 15, 1985 (36 y.o. F) Treating RN: Angelina Pih Primary Care Odelle Kosier: Lynnell Chad Other Clinician: Referring Orena Cavazos: Lynnell Chad Treating Lerline Valdivia/Extender: Rowan Blase in Treatment: 8 Visit Information History Since Last Visit Added or deleted any medications: No Patient Arrived: Wheel Chair Any new allergies or adverse reactions: No Arrival Time: 08:02 Had a fall or experienced change in No Accompanied By: self activities of daily living that may affect Transfer Assistance: None risk of falls: Patient Identification Verified: Yes Hospitalized since last visit: No Secondary Verification Process Completed: Yes Has Dressing in Place as Prescribed: Yes Patient Requires Transmission-Based Precautions: No Pain Present Now: No Patient Has Alerts: Yes Electronic Signature(s) Signed: 03/30/2022 4:18:49 PM By: Angelina Pih Entered By: Angelina Pih on 03/30/2022 08:02:40 [name] ([id number]) -------------------------------------------------------------------------------- Clinic Level of Care Assessment Details Patient Name: [name] Date of Service: 03/30/2022 8:00 AM Medical Record Number: [id number] Patient Account Number: [id number] Date of Birth/Sex: July 25, 1986 (35 y.o. F) Treating RN: Angelina Pih Primary Care Cleon Signorelli: Lynnell Chad Other Clinician: Referring Clent Damore: Lynnell Chad Treating Clayvon Parlett/Extender: Rowan Blase in Treatment: 8 Clinic Level of Care Assessment Items TOOL 1 Quantity Score []  - Use when EandM and Procedure is performed on INITIAL visit 0 ASSESSMENTS - Nursing Assessment / Reassessment []  - General Physical Exam (combine w/ comprehensive assessment (listed just below) when  performed on new 0 pt. evals) []  - 0 Comprehensive Assessment (HX, ROS, Risk Assessments, Wounds Hx, etc.) ASSESSMENTS - Wound and Skin Assessment / Reassessment []  - Dermatologic / Skin Assessment (not related to wound area) 0 ASSESSMENTS - Ostomy and/or Continence Assessment and Care []  - Incontinence Assessment and Management 0 []  - 0 Ostomy Care Assessment and Management (repouching, etc.) PROCESS - Coordination of Care []  - Simple Patient / Family Education for ongoing care 0 []  - 0 Complex (extensive) Patient / Family Education for ongoing care []  - 0 Staff obtains , Records, Test Results / Process Orders []  - 0 Staff telephones HHA, Nursing Homes / Clarify orders / etc []  - 0 Routine Transfer to another Facility (non-emergent condition) []  - 0 Routine Hospital Admission (non-emergent condition) []  - 0 New Admissions / / Ordering NPWT, Apligraf, etc. []  - 0 Emergency Hospital Admission (emergent condition) PROCESS - Special Needs []  - Pediatric / Minor Patient Management 0 []  - 0 Isolation Patient Management []  - 0 Hearing / Language / Visual special needs []  - 0 Assessment of Community assistance (transportation, D/C planning, etc.) []  - 0 Additional assistance / Altered mentation []  - 0 Support Surface(s) Assessment (bed, cushion, seat, etc.) INTERVENTIONS - Miscellaneous []  - External ear exam 0 []  - 0 Patient Transfer (multiple staff / / Similar devices) []  - 0 Simple Staple / Suture removal (25 or less) []  - 0 Complex Staple / Suture removal (26 or more) []  - 0 Hypo/Hyperglycemic Management (do not check if billed separately) []  - 0 Ankle / Brachial Index (ABI) - do not check if billed separately Has the patient been seen at the hospital within the last three years: Yes Total Score: 0 Level Of Care: ____ ( ) Electronic Signature(s) Signed: 03/30/2022 4:18:49 PM By: Entered By: on 03/30/2022 12:10:42 Chiropractor ( ) -------------------------------------------------------------------------------- Encounter Discharge Information Details Patient Name: Date of Service: 03/30/2022 8:00 AM  Medical Record Number: [id number] Patient Account Number: [id number] Date of Birth/Sex: Feb 03, 1986 (36 y.o. F) Treating RN: Angelina Pih Primary Care Unknown Flannigan: Lynnell Chad Other Clinician: Referring Julien Berryman: Lynnell Chad Treating Abreanna Drawdy/Extender: Rowan Blase in Treatment: 8 Encounter Discharge Information Items Discharge Condition: Stable Ambulatory Status: Wheelchair Discharge Destination: Home Transportation: Private Auto Accompanied By: self Schedule Follow-up Appointment: Yes Clinical Summary of Care: Electronic Signature(s) Signed: 03/30/2022 12:10:37 PM By: Angelina Pih Entered By: Angelina Pih on 03/30/2022 12:10:37 [name] ([id number]) -------------------------------------------------------------------------------- Negative Pressure Wound Therapy Maintenance (NPWT) Details Patient Name: [name] Date of Service: 03/30/2022 8:00 AM Medical Record Number: [id number] Patient Account Number: [id number] Date of Birth/Sex: Mar 02, 1986 (35 y.o. F) Treating RN: Angelina Pih Primary Care Laelia Angelo: Lynnell Chad Other Clinician: Referring Zi Sek: Lynnell Chad Treating Marcianna Daily/Extender: Rowan Blase in Treatment: 8 NPWT Maintenance Performed for: Wound #1 Right Amputation Site - Below Knee Performed By: Angelina Pih, RN Type: Other Coverage Size (sq cm): 27.45 Pressure Type: Constant Pressure Setting: 125 mmHG Drain Type: None Sponge/Dressing Type: Foam, Black Date Initiated: 01/23/2022 Dressing Removed: No Quantity of Sponges/Gauze Removed: 1 Canister Changed: No Dressing Reapplied: No Quantity of Sponges/Gauze Inserted: 1 Respones To Treatment: tolerated  well Days On NPWT: 67 Electronic Signature(s) Signed: 03/30/2022 12:10:03 PM By: Angelina Pih Entered By: Angelina Pih on 03/30/2022 12:10:03 [name] ([id number]) -------------------------------------------------------------------------------- Wound Assessment Details Patient Name: [name] Date of Service: 03/30/2022 8:00 AM Medical Record Number: [id number] Patient Account Number: [id number] Date of Birth/Sex: 1986-01-21 (36 y.o. F) Treating RN: Angelina Pih Primary Care Charlane Westry: Lynnell Chad Other Clinician: Referring Krishang Reading: Lynnell Chad Treating Yovanna Cogan/Extender: Rowan Blase in Treatment: 8 Wound Status Wound Number: 1 Primary Etiology: Abscess Wound Location: Right Amputation Site - Below Knee Wound Status: Open Wounding Event: Gradually Appeared Comorbid History: Hypertension, Type II Diabetes Date Acquired: 01/14/2022 Weeks Of Treatment: 8 Clustered Wound: No Wound Measurements Length: (cm) 4.5 Width: (cm) 6.1 Depth: (cm) 1.1 Area: (cm) 21.559 Volume: (cm) 23.715 % Reduction in Area: 20.2% % Reduction in Volume: 77.5% Epithelialization: Small (1-33%) Wound Description Classification: Full Thickness With Exposed Support Structures Exudate Amount: Medium Exudate Type: Serosanguineous Exudate Color: red, brown Foul Odor After Cleansing: No Slough/Fibrino Yes Wound Bed Granulation Amount: Large (67-100%) Exposed Structure Granulation Quality: Red, Pink Fascia Exposed: No Necrotic Amount: None Present (0%) Fat Layer (Subcutaneous Tissue) Exposed: Yes Tendon Exposed: No Muscle Exposed: No Joint Exposed: No Bone Exposed: No Treatment Notes Wound #1 (Amputation Site - Below Knee) Wound Laterality: Right Cleanser Peri-Wound Care Topical Primary Dressing Secondary Dressing Secured With Compression Wrap Compression Stockings Add-Ons Electronic Signature(s) Signed: 03/30/2022 4:18:49 PM By: Angelina Pih Entered By:  Angelina Pih on 03/30/2022 08:02:59

## 2022-03-31 ENCOUNTER — Other Ambulatory Visit: Payer: Self-pay | Admitting: Nurse Practitioner

## 2022-03-31 DIAGNOSIS — Z89511 Acquired absence of right leg below knee: Secondary | ICD-10-CM

## 2022-03-31 MED ORDER — GABAPENTIN 300 MG PO CAPS: 300.0000 mg | ORAL_CAPSULE | Freq: Three times a day (TID) | ORAL | 3 refills | Status: DC

## 2022-03-31 NOTE — Telephone Encounter (Signed)
Please ignore my previous response, I sent in gabapentin based on vascular surgeons notes from 11/22/21

## 2022-03-31 NOTE — Telephone Encounter (Signed)
Pt aware Gabapentin has been sent to pharmacy

## 2022-03-31 NOTE — Telephone Encounter (Signed)
I don't have gabapentin on patients list, she can have pharmacy send a refill order based on prior order or contact vascular surgeons Nurse to send a refill, I have to have something to go by. Or else it will be a brand new prescription that I have no note or reason to back up my order.

## 2022-03-31 NOTE — Progress Notes (Signed)
SHARNI, NEGRON ([id number]) Visit Report for 03/30/2022 Physician Orders Details Patient Name: Allison, Howell Date of Service: 03/30/2022 8:00 AM Medical Record Number: [id number] Patient Account Number: [id number] Date of Birth/Sex: 06/27/1986 (36 y.o. F) Treating RN: Angelina Pih Primary Care Provider: Lynnell Chad Other Clinician: Referring Provider: Lynnell Chad Treating Provider/Extender: Rowan Blase in Treatment: 8 Verbal / Phone Orders: No Diagnosis Coding Follow-up Appointments o Return Appointment in 1 week. o Nurse Visit as needed - thursday Bathing/ Shower/ Hygiene o No tub bath. Negative Pressure Wound Therapy o Wound VAC settings at continuous pressure. Use foam to wound cavity. Please order WHITE foam to fill any tunnel/s and/or undermining when necessary. Change VAC dressing 2 X WEEK. Change canister as indicated when full. o Number of foam/gauze pieces used in the dressing = - hydrofera blue to wound bed and area with depth , then 1 piece black foam , o Other: - wet to dry if wound vac malfunctions Wound Treatment Electronic Signature(s) Signed: 03/30/2022 12:10:18 PM By: Angelina Pih Signed: 03/31/2022 4:59:17 PM By: Lenda Kelp PA-C Entered By: Angelina Pih on 03/30/2022 12:10:18 [name] ([id number]) -------------------------------------------------------------------------------- SuperBill Details Patient Name: [name] Date of Service: 03/30/2022 Medical Record Number: [id number] Patient Account Number: [id number] Date of Birth/Sex: 1985-12-01 (35 y.o. 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 E11.43 Type 2 diabetes mellitus with diabetic autonomic (poly)neuropathy I10 Essential (primary) hypertension Facility Procedures CPT4 Code: 85631497 Description: [phone] - WOUND VAC-50 SQ CM OR LESS Modifier: Quantity: 1 CPT4 Code: Description: ICD-10 Diagnosis Description L97.812 Non-pressure chronic ulcer of other part of right lower leg with fat layer E11.622 Type 2 diabetes mellitus with other skin ulcer Modifier: exposed Quantity: Physician Procedures CPT4 Code: 8588502 Description: 97605 - WC PHYS TX WOUND VAC < 50 SQ CM Modifier: Quantity: 1 CPT4 Code: Description: ICD-10 Diagnosis Description L97.812 Non-pressure chronic ulcer of other part of right lower leg with fat layer E11.622 Type 2 diabetes mellitus with other skin ulcer Modifier: exposed Quantity: Electronic Signature(s) Signed: 03/30/2022 12:10:55 PM By: Angelina Pih Signed: 03/31/2022 4:59:17 PM By: Lenda Kelp PA-C Entered By: Angelina Pih on 03/30/2022 12:10:55

## 2022-03-31 NOTE — Telephone Encounter (Signed)
Please advise from Vascular Notes 11/22/21

## 2022-04-03 ENCOUNTER — Encounter: Payer: Medicaid Other | Attending: Physician Assistant | Admitting: Physician Assistant

## 2022-04-03 DIAGNOSIS — X58XXXA Exposure to other specified factors, initial encounter: Secondary | ICD-10-CM | POA: Insufficient documentation

## 2022-04-03 DIAGNOSIS — Z89511 Acquired absence of right leg below knee: Secondary | ICD-10-CM | POA: Diagnosis not present

## 2022-04-03 DIAGNOSIS — E1140 Type 2 diabetes mellitus with diabetic neuropathy, unspecified: Secondary | ICD-10-CM | POA: Insufficient documentation

## 2022-04-03 DIAGNOSIS — E1151 Type 2 diabetes mellitus with diabetic peripheral angiopathy without gangrene: Secondary | ICD-10-CM | POA: Insufficient documentation

## 2022-04-03 DIAGNOSIS — T8781 Dehiscence of amputation stump: Secondary | ICD-10-CM | POA: Diagnosis not present

## 2022-04-03 DIAGNOSIS — E11622 Type 2 diabetes mellitus with other skin ulcer: Secondary | ICD-10-CM | POA: Insufficient documentation

## 2022-04-03 DIAGNOSIS — I10 Essential (primary) hypertension: Secondary | ICD-10-CM | POA: Diagnosis not present

## 2022-04-03 DIAGNOSIS — L97812 Non-pressure chronic ulcer of other part of right lower leg with fat layer exposed: Secondary | ICD-10-CM | POA: Insufficient documentation

## 2022-04-03 DIAGNOSIS — E1143 Type 2 diabetes mellitus with diabetic autonomic (poly)neuropathy: Secondary | ICD-10-CM | POA: Diagnosis not present

## 2022-04-03 NOTE — Progress Notes (Addendum)
JETAUN, COLBATH ([id number]) Visit Report for 04/03/2022 Chief Complaint Document Details Patient Name: Allison, Howell Date of Service: 04/03/2022 8:00 AM Medical Record Number: [id number] Patient Account Number: [id number] Date of Birth/Sex: September 27, 1986 (36 y.o. F) Treating RN: Yevonne Pax Primary Care Provider: Lynnell Chad Other Clinician: Referring Provider: Lynnell Chad Treating Provider/Extender: Rowan Blase in Treatment: 9 Information Obtained from: Patient Chief Complaint Right BKA Amputation Site Disruption Electronic Signature(s) Signed: 04/03/2022 8:19:55 AM By: Lenda Kelp PA-C Entered By: Lenda Kelp on 04/03/2022 08:19:54 [name] ([id number]) -------------------------------------------------------------------------------- HPI Details Patient Name: [name] Date of Service: 04/03/2022 8:00 AM Medical Record Number: [id number] Patient Account Number: [id number] Date of Birth/Sex: [birth year] (35 y.o. F) Treating RN: Yevonne Pax Primary Care Provider: Lynnell Chad Other Clinician: Referring Provider: Lynnell Chad Treating Provider/Extender: Rowan Blase in Treatment: 9 History of Present Illness HPI Description: 01-27-2022 upon evaluation today patient appears to be doing somewhat poorly in regard to her right below-knee amputation. This unfortunately developed a abscess internally following the initial amputation which was January 2023. Subsequently she was getting very close to starting to work on the prosthesis when she had an area that somewhat bulged out ended up being an abscess and the incision and drainage was performed April 22. Subsequently she did have antibiotic beads placed in a wound VAC after this was cleared out. Her most recent reported hemoglobin A1c was 6.4. With that being said the patient tells me that her blood sugars seem to be under pretty good control as best she can tell at this point. She also tells me that the wound  VAC seems to be functioning appropriately although upon evaluation the did not appear to be anything tucked into the region of undermining and depth in the roughly 9-12 o'clock location based on what we saw today. This is also where the antibiotic beads are and I can see those in place. I do believe that she is getting need to have the wound VAC tucked down into this location. Patient does have a history of diabetes mellitus type 2, diabetic neuropathy, and hypertension. This initial amputation occurred as a result of her having sewing needles that actually went into her heel she did not know about it started having trouble with her heel and ended up with a significant infection which was stated to be necrotizing and she subsequently had to have emergency amputation. 02-13-2022 upon evaluation today patient appears to be doing well with regard to her wound. She has been tolerating the dressing changes without complication. The wound VAC does seem to be doing a great job. Fortunately I do not see any evidence of active infection locally or systemically at this time which is great news. She still has some of the antibiotic beads in the base of the wound. 02-20-2022 upon evaluation today patient appears to be doing well currently. There does not appear to be any evidence of active infection locally nor systemically which is great news and overall I am extremely pleased with where we stand today. I do believe that the wound VAC is doing a good job here and the color of the drainage is improved again were unsure as to whether this may have been the antibiotic beads or if there was something such as Pseudomonas causing the greenish discoloration to the drainage either way she is doing significantly better. 03-06-2022 upon evaluation today patient appears to be doing well with regard to her wound. The only issue I see is that she does have  a little bit of a pocket of fluid that collected in the deeper section  everything else is actually measuring smaller and looks better. This was a point where I really did expect this was been a turnaround and start doing a lot better. I think we are especially seeing that right now. 03-20-2022 upon evaluation today patient appears to be doing well with regard to her wound in fact this is actually significantly improved compared to last time I saw her. Fortunately I do not see any evidence of active infection at this time. No fevers, chills, nausea, vomiting, or diarrhea. 03-27-2022 upon evaluation today patient appears to be doing excellent in regard to her wound. This is showing signs of improvement which is great news and overall I am extremely pleased with where we stand currently. I do not see any evidence of active infection locally or systemically which is great news. No fevers, chills, nausea, vomiting, or diarrhea. 04-03-2022 upon evaluation today patient's wound is showing signs of significant improvement. I am still very pleased with where we stand and how things are going. I think that she is really making excellent progress which is great news. I do not see any signs of active infection at this time. Electronic Signature(s) Signed: 04/03/2022 9:49:33 AM By: Lenda KelpStone III, Ravenne Wayment PA-C Entered By: Lenda KelpStone III, Deedra Pro on 04/03/2022 09:49:32 [name], Edell ([id number]) -------------------------------------------------------------------------------- Physical Exam Details Patient Name: [name], [name] Date of Service: 04/03/2022 8:00 AM Medical Record Number: [id number] Patient Account Number: [id number] Date of Birth/Sex: 06-12-1986 (35 y.o. F) Treating RN: Yevonne PaxEpps, Carrie Primary Care Provider: Lynnell ChadIjaola, Onyeje Other Clinician: Referring Provider: Lynnell ChadIjaola, Onyeje Treating Provider/Extender: Rowan BlaseStone, Clothilde Tippetts Weeks in Treatment: 9 Constitutional Well-nourished and well-hydrated in no acute distress. Respiratory normal breathing without difficulty. Psychiatric this patient is able  to make decisions and demonstrates good insight into disease process. Alert and Oriented x 3. pleasant and cooperative. Notes Upon inspection patient's wound bed actually showed signs of good granulation and epithelization at this point. Fortunately there does not appear to be any evidence of infection locally or systemically which is great news and overall I think cutting the Hydrofera Blue and a triangle down into the deeper area was very beneficial for her. Electronic Signature(s) Signed: 04/03/2022 9:50:01 AM By: Lenda KelpStone III, Owynn Mosqueda PA-C Entered By: Lenda KelpStone III, Evey Mcmahan on 04/03/2022 09:50:00 [name], [name] ([id number]) -------------------------------------------------------------------------------- Physician Orders Details Patient Name: [name], [name] Date of Service: 04/03/2022 8:00 AM Medical Record Number: [id number] Patient Account Number: [id number] Date of Birth/Sex: 06-12-1986 (35 y.o. F) Treating RN: Yevonne PaxEpps, Carrie Primary Care Provider: Lynnell ChadIjaola, Onyeje Other Clinician: Referring Provider: Lynnell ChadIjaola, Onyeje Treating Provider/Extender: Rowan BlaseStone, Nickey Canedo Weeks in Treatment: 9 Verbal / Phone Orders: No Diagnosis Coding ICD-10 Coding Code Description E11.622 Type 2 diabetes mellitus with other skin ulcer T81.31XA Disruption of external operation (surgical) wound, not elsewhere classified, initial encounter L97.812 Non-pressure chronic ulcer of other part of right lower leg with fat layer exposed E11.43 Type 2 diabetes mellitus with diabetic autonomic (poly)neuropathy I10 Essential (primary) hypertension Follow-up Appointments o Return Appointment in 1 week. o Nurse Visit as needed - thursday Bathing/ Shower/ Hygiene o No tub bath. Negative Pressure Wound Therapy o Wound VAC settings at 125mmHg continuous pressure. Use foam to wound cavity. Please order WHITE foam to fill any tunnel/s and/or undermining when necessary. Change VAC dressing 2 X WEEK. Change canister as indicated when  full. o Number of foam/gauze pieces used in the dressing = - hydrofera blue to wound bed and area with depth , then 1  piece black foam , o Other: - wet to dry if wound vac malfunctions Wound Treatment Electronic Signature(s) Signed: 04/03/2022 3:51:56 PM By: Lenda Kelp PA-C Signed: 04/03/2022 4:23:03 PM By: Yevonne Pax RN Entered By: Yevonne Pax on 04/03/2022 09:35:22 [name] ([id number]) -------------------------------------------------------------------------------- Problem List Details Patient Name: [name] Date of Service: 04/03/2022 8:00 AM Medical Record Number: [id number] Patient Account Number: [id number] Date of Birth/Sex: Jul 12, 1986 (35 y.o. F) Treating RN: Yevonne Pax Primary Care Provider: Lynnell Chad Other Clinician: Referring Provider: Lynnell Chad Treating Provider/Extender: Rowan Blase in Treatment: 9 Active Problems ICD-10 Encounter Code Description Active Date MDM Diagnosis E11.622 Type 2 diabetes mellitus with other skin ulcer 01/27/2022 No Yes T81.31XA Disruption of external operation (surgical) wound, not elsewhere 01/27/2022 No Yes classified, initial encounter L97.812 Non-pressure chronic ulcer of other part of right lower leg with fat layer 01/27/2022 No Yes exposed E11.43 Type 2 diabetes mellitus with diabetic autonomic (poly)neuropathy 01/27/2022 No Yes I10 Essential (primary) hypertension 01/27/2022 No Yes Inactive Problems Resolved Problems Electronic Signature(s) Signed: 04/03/2022 8:19:51 AM By: Lenda Kelp PA-C Entered By: Lenda Kelp on 04/03/2022 08:19:50 [name] ([id number]) -------------------------------------------------------------------------------- Progress Note Details Patient Name: [name] Date of Service: 04/03/2022 8:00 AM Medical Record Number: [id number] Patient Account Number: [id number] Date of Birth/Sex: [birth year] (35 y.o. F) Treating RN: Yevonne Pax Primary Care Provider: Lynnell Chad Other Clinician: Referring Provider: Lynnell Chad Treating Provider/Extender: Rowan Blase in Treatment: 9 Subjective Chief Complaint Information obtained from Patient Right BKA Amputation Site Disruption History of Present Illness (HPI) 01-27-2022 upon evaluation today patient appears to be doing somewhat poorly in regard to her right below-knee amputation. This unfortunately developed a abscess internally following the initial amputation which was January 2023. Subsequently she was getting very close to starting to work on the prosthesis when she had an area that somewhat bulged out ended up being an abscess and the incision and drainage was performed April 22. Subsequently she did have antibiotic beads placed in a wound VAC after this was cleared out. Her most recent reported hemoglobin A1c was 6.4. With that being said the patient tells me that her blood sugars seem to be under pretty good control as best she can tell at this point. She also tells me that the wound VAC seems to be functioning appropriately although upon evaluation the did not appear to be anything tucked into the region of undermining and depth in the roughly 9-12 o'clock location based on what we saw today. This is also where the antibiotic beads are and I can see those in place. I do believe that she is getting need to have the wound VAC tucked down into this location. Patient does have a history of diabetes mellitus type 2, diabetic neuropathy, and hypertension. This initial amputation occurred as a result of her having sewing needles that actually went into her heel she did not know about it started having trouble with her heel and ended up with a significant infection which was stated to be necrotizing and she subsequently had to have emergency amputation. 02-13-2022 upon evaluation today patient appears to be doing well with regard to her wound. She has been tolerating the dressing changes without  complication. The wound VAC does seem to be doing a great job. Fortunately I do not see any evidence of active infection locally or systemically at this time which is great news. She still has some of the antibiotic beads in the base of the wound. 02-20-2022 upon evaluation  today patient appears to be doing well currently. There does not appear to be any evidence of active infection locally nor systemically which is great news and overall I am extremely pleased with where we stand today. I do believe that the wound VAC is doing a good job here and the color of the drainage is improved again were unsure as to whether this may have been the antibiotic beads or if there was something such as Pseudomonas causing the greenish discoloration to the drainage either way she is doing significantly better. 03-06-2022 upon evaluation today patient appears to be doing well with regard to her wound. The only issue I see is that she does have a little bit of a pocket of fluid that collected in the deeper section everything else is actually measuring smaller and looks better. This was a point where I really did expect this was been a turnaround and start doing a lot better. I think we are especially seeing that right now. 03-20-2022 upon evaluation today patient appears to be doing well with regard to her wound in fact this is actually significantly improved compared to last time I saw her. Fortunately I do not see any evidence of active infection at this time. No fevers, chills, nausea, vomiting, or diarrhea. 03-27-2022 upon evaluation today patient appears to be doing excellent in regard to her wound. This is showing signs of improvement which is great news and overall I am extremely pleased with where we stand currently. I do not see any evidence of active infection locally or systemically which is great news. No fevers, chills, nausea, vomiting, or diarrhea. 04-03-2022 upon evaluation today patient's wound is showing  signs of significant improvement. I am still very pleased with where we stand and how things are going. I think that she is really making excellent progress which is great news. I do not see any signs of active infection at this time. Objective Constitutional Well-nourished and well-hydrated in no acute distress. Vitals Time Taken: 8:09 AM, Height: 67 in, Weight: 219 lbs, BMI: 34.3, Temperature: 98.4 F, Pulse: 105 bpm, Respiratory Rate: 18 breaths/min, Blood Pressure: 132/88 mmHg. Respiratory normal breathing without difficulty. [name], [name] ([id number]) Psychiatric this patient is able to make decisions and demonstrates good insight into disease process. Alert and Oriented x 3. pleasant and cooperative. General Notes: Upon inspection patient's wound bed actually showed signs of good granulation and epithelization at this point. Fortunately there does not appear to be any evidence of infection locally or systemically which is great news and overall I think cutting the Hydrofera Blue and a triangle down into the deeper area was very beneficial for her. Integumentary (Hair, Skin) Wound #1 status is Open. Original cause of wound was Gradually Appeared. The date acquired was: 01/14/2022. The wound has been in treatment 9 weeks. The wound is located on the Right Amputation Site - Below Knee. The wound measures 4cm length x 6cm width x 1cm depth; 18.85cm^2 area and 18.85cm^3 volume. There is Fat Layer (Subcutaneous Tissue) exposed. There is no tunneling or undermining noted. There is a medium amount of serosanguineous drainage noted. There is large (67-100%) red, pink granulation within the wound bed. There is no necrotic tissue within the wound bed. Assessment Active Problems ICD-10 Type 2 diabetes mellitus with other skin ulcer Disruption of external operation (surgical) wound, not elsewhere classified, initial encounter Non-pressure chronic ulcer of other part of right lower leg with fat  layer exposed Type 2 diabetes mellitus with diabetic autonomic (poly)neuropathy Essential (  primary) hypertension Plan Follow-up Appointments: Return Appointment in 1 week. Nurse Visit as needed - thursday Bathing/ Shower/ Hygiene: No tub bath. Negative Pressure Wound Therapy: Wound VAC settings at continuous pressure. Use foam to wound cavity. Please order WHITE foam to fill any tunnel/s and/or undermining when necessary. Change VAC dressing 2 X WEEK. Change canister as indicated when full. Number of foam/gauze pieces used in the dressing = - hydrofera blue to wound bed and area with depth , then 1 piece black foam , Other: - wet to dry if wound vac malfunctions 1. I would recommend that we continue with the wound VAC for the time being I think that still appropriate and the patient is in agreement with the plan. Hopefully were very close to discontinuation of the wound VAC usage. 2. Also can recommend we continue with the [facility] which is doing great to the wound bed. That might be even what we continue with once we go away from the wound VAC we will see how much drainage she has at that point. We will see patient back for reevaluation in 1 week here in the clinic. If anything worsens or changes patient will contact our office for additional recommendations. Electronic Signature(s) Signed: 04/03/2022 9:50:18 AM By: Lenda Kelp PA-C Entered By: Lenda Kelp on 04/03/2022 09:50:17 [name] ([id number]) -------------------------------------------------------------------------------- SuperBill Details Patient Name: [name] Date of Service: 04/03/2022 Medical Record Number: [id number] Patient Account Number: [id number] Date of Birth/Sex: [birth year] (35 y.o. F) Treating RN: Yevonne Pax Primary Care Provider: Lynnell Chad Other Clinician: Referring Provider: Lynnell Chad Treating Provider/Extender: Rowan Blase in Treatment: 9 Diagnosis  Coding ICD-10 Codes Code Description E11.622 Type 2 diabetes mellitus with other skin ulcer T81.31XA Disruption of external operation (surgical) wound, not elsewhere classified, initial encounter L97.812 Non-pressure chronic ulcer of other part of right lower leg with fat layer exposed E11.43 Type 2 diabetes mellitus with diabetic autonomic (poly)neuropathy I10 Essential (primary) hypertension Facility Procedures CPT4 Code: 08657846 Description: [phone] - WOUND VAC-50 SQ CM OR LESS Modifier: Quantity: 1 Physician Procedures CPT4 Code: [id number] Description: 99214 - WC PHYS LEVEL 4 - EST PT Modifier: Quantity: 1 CPT4 Code: Description: ICD-10 Diagnosis Description E11.622 Type 2 diabetes mellitus with other skin ulcer T81.31XA Disruption of external operation (surgical) wound, not elsewhere classifi L97.812 Non-pressure chronic ulcer of other part of right lower leg with  fat laye E11.43 Type 2 diabetes mellitus with diabetic autonomic (poly)neuropathy Modifier: ed, initial encounter r exposed Quantity: Electronic Signature(s) Signed: 04/03/2022 9:50:56 AM By: Lenda Kelp PA-C Entered By: Lenda Kelp on 04/03/2022 09:50:56

## 2022-04-03 NOTE — Progress Notes (Signed)
SAMAN, GIDDENS ([id number]) Visit Report for 04/03/2022 Arrival Information Details Patient Name: [name], [name] Date of Service: 04/03/2022 8:00 AM Medical Record Number: [id number] Patient Account Number: [id number] Date of Birth/Sex: 1985/10/03 (36 y.o. F) Treating RN: Yevonne Pax Primary Care Deem Marmol: Lynnell Chad Other Clinician: Referring Tierney Behl: Lynnell Chad Treating Abhinav Mayorquin/Extender: Rowan Blase in Treatment: 9 Visit Information History Since Last Visit All ordered tests and consults were completed: No Patient Arrived: Wheel Chair Added or deleted any medications: No Arrival Time: 08:04 Any new allergies or adverse reactions: No Accompanied By: self Had a fall or experienced change in No Transfer Assistance: None activities of daily living that may affect Patient Identification Verified: Yes risk of falls: Secondary Verification Process Completed: Yes Signs or symptoms of abuse/neglect since last visito No Patient Requires Transmission-Based Precautions: No Hospitalized since last visit: No Patient Has Alerts: Yes Implantable device outside of the clinic excluding No cellular tissue based products placed in the center since last visit: Has Dressing in Place as Prescribed: Yes Pain Present Now: No Electronic Signature(s) Signed: 04/03/2022 4:23:03 PM By: Yevonne Pax RN Entered By: Yevonne Pax on 04/03/2022 08:09:32 [name] ([id number]) -------------------------------------------------------------------------------- Clinic Level of Care Assessment Details Patient Name: [name] Date of Service: 04/03/2022 8:00 AM Medical Record Number: [id number] Patient Account Number: [id number] Date of Birth/Sex: 20-Aug-1986 (35 y.o. F) Treating RN: Yevonne Pax Primary Care Nadeem Romanoski: Lynnell Chad Other Clinician: Referring Juli Odom: Lynnell Chad Treating Iysha Mishkin/Extender: Rowan Blase in Treatment: 9 Clinic Level of Care Assessment Items TOOL 1  Quantity Score []  - Use when EandM and Procedure is performed on INITIAL visit 0 ASSESSMENTS - Nursing Assessment / Reassessment []  - General Physical Exam (combine w/ comprehensive assessment (listed just below) when performed on new 0 pt. evals) []  - 0 Comprehensive Assessment (HX, ROS, Risk Assessments, Wounds Hx, etc.) ASSESSMENTS - Wound and Skin Assessment / Reassessment []  - Dermatologic / Skin Assessment (not related to wound area) 0 ASSESSMENTS - Ostomy and/or Continence Assessment and Care []  - Incontinence Assessment and Management 0 []  - 0 Ostomy Care Assessment and Management (repouching, etc.) PROCESS - Coordination of Care []  - Simple Patient / Family Education for ongoing care 0 []  - 0 Complex (extensive) Patient / Family Education for ongoing care []  - 0 Staff obtains , Records, Test Results / Process Orders []  - 0 Staff telephones HHA, Nursing Homes / Clarify orders / etc []  - 0 Routine Transfer to another Facility (non-emergent condition) []  - 0 Routine Hospital Admission (non-emergent condition) []  - 0 New Admissions / / Ordering NPWT, Apligraf, etc. []  - 0 Emergency Hospital Admission (emergent condition) PROCESS - Special Needs []  - Pediatric / Minor Patient Management 0 []  - 0 Isolation Patient Management []  - 0 Hearing / Language / Visual special needs []  - 0 Assessment of Community assistance (transportation, D/C planning, etc.) []  - 0 Additional assistance / Altered mentation []  - 0 Support Surface(s) Assessment (bed, cushion, seat, etc.) INTERVENTIONS - Miscellaneous []  - External ear exam 0 []  - 0 Patient Transfer (multiple staff / / Similar devices) []  - 0 Simple Staple / Suture removal (25 or less) []  - 0 Complex Staple / Suture removal (26 or more) []  - 0 Hypo/Hyperglycemic Management (do not check if billed separately) []  - 0 Ankle / Brachial Index (ABI) - do not check if billed  separately Has the patient been seen at the hospital within the last three years: Yes Total Score: 0 Level Of Care: ____  [name], [name] ([id number]) Electronic Signature(s) Signed: 04/03/2022 4:23:03 PM By: Yevonne Pax RN Entered By: Yevonne Pax on 04/03/2022 09:35:28 [name] ([id number]) -------------------------------------------------------------------------------- Encounter Discharge Information Details Patient Name: [name] Date of Service: 04/03/2022 8:00 AM Medical Record Number: [id number] Patient Account Number: [id number] Date of Birth/Sex: 1986-07-31 (35 y.o. F) Treating RN: Yevonne Pax Primary Care Rafael Salway: Lynnell Chad Other Clinician: Referring Jorian Willhoite: Lynnell Chad Treating Jamesa Tedrick/Extender: Rowan Blase in Treatment: 9 Encounter Discharge Information Items Discharge Condition: Stable Ambulatory Status: Ambulatory Discharge Destination: Home Transportation: Private Auto Accompanied By: self Schedule Follow-up Appointment: Yes Clinical Summary of Care: Electronic Signature(s) Signed: 04/03/2022 4:23:03 PM By: Yevonne Pax RN Entered By: Yevonne Pax on 04/03/2022 09:36:20 [name] ([id number]) -------------------------------------------------------------------------------- Lower Extremity Assessment Details Patient Name: [name] Date of Service: 04/03/2022 8:00 AM Medical Record Number: [id number] Patient Account Number: [id number] Date of Birth/Sex: [birth year] (35 y.o. F) Treating RN: Yevonne Pax Primary Care Amauris Debois: Lynnell Chad Other Clinician: Referring Jenniferann Stuckert: Lynnell Chad Treating Damion Kant/Extender: Rowan Blase in Treatment: 9 Electronic Signature(s) Signed: 04/03/2022 4:23:03 PM By: Yevonne Pax RN Entered By: Yevonne Pax on 04/03/2022 08:17:54 [name] ([id number]) -------------------------------------------------------------------------------- Multi Wound Chart Details Patient Name: [name] Date of Service: 04/03/2022 8:00 AM Medical Record Number: [id number] Patient Account Number: [id number] Date of Birth/Sex: 07-09-1986 (35 y.o. F) Treating RN: Yevonne Pax Primary Care Yesmin Mutch: Lynnell Chad Other Clinician: Referring Gisselle Galvis: Lynnell Chad Treating Cecilia Nishikawa/Extender: Rowan Blase in Treatment: 9 Vital Signs Height(in): 67 Pulse(bpm): 105 Weight(lbs): 219 Blood Pressure(mmHg): 132/88 Body Mass Index(BMI): 34.3 Temperature(F): 98.4 Respiratory Rate(breaths/min): 18 Photos: [N/A:N/A] Wound Location: Right Amputation Site - Below Knee N/A N/A Wounding Event: Gradually Appeared N/A N/A Primary Etiology: Abscess N/A N/A Comorbid History: Hypertension, Type II Diabetes N/A N/A Date Acquired: 01/14/2022 N/A N/A Weeks of Treatment: 9 N/A N/A Wound Status: Open N/A N/A Wound Recurrence: No N/A N/A Measurements L x W x D (cm) 4x6x1 N/A N/A Area (cm) : 18.85 N/A N/A Volume (cm) : 18.85 N/A N/A % Reduction in Area: 30.20% N/A N/A % Reduction in Volume: 82.10% N/A N/A Classification: Full Thickness With Exposed N/A N/A Support Structures Exudate Amount: Medium N/A N/A Exudate Type: Serosanguineous N/A N/A Exudate Color: red, brown N/A N/A Granulation Amount: Large (67-100%) N/A N/A Granulation Quality: Red, Pink N/A N/A Necrotic Amount: None Present (0%) N/A N/A Exposed Structures: Fat Layer (Subcutaneous Tissue): N/A N/A Yes Fascia: No Tendon: No Muscle: No Joint: No Bone: No Epithelialization: Small (1-33%) N/A N/A Treatment Notes Electronic Signature(s) Signed: 04/03/2022 4:23:03 PM By: Yevonne Pax RN Entered By: Yevonne Pax on 04/03/2022 08:18:08 [name] ([id number]) -------------------------------------------------------------------------------- Multi-Disciplinary Care Plan Details Patient Name: [name] Date of Service: 04/03/2022 8:00 AM Medical Record Number: [id number] Patient Account Number: [id number] Date of  Birth/Sex: 1986/03/28 (35 y.o. F) Treating RN: Yevonne Pax Primary Care Maleko Greulich: Lynnell Chad Other Clinician: Referring Eda Magnussen: Lynnell Chad Treating Maeleigh Buschman/Extender: Rowan Blase in Treatment: 9 Active Inactive Wound/Skin Impairment Nursing Diagnoses: Knowledge deficit related to ulceration/compromised skin integrity Goals: Patient/caregiver will verbalize understanding of skin care regimen Date Initiated: 01/27/2022 Target Resolution Date: 02/26/2022 Goal Status: Active Ulcer/skin breakdown will have a volume reduction of 30% by week 4 Date Initiated: 01/27/2022 Target Resolution Date: 03/29/2022 Goal Status: Active Ulcer/skin breakdown will have a volume reduction of 50% by week 8 Date Initiated: 01/27/2022 Target Resolution Date: 04/28/2022 Goal Status: Active Ulcer/skin breakdown will have a volume reduction of 80% by week 12 Date Initiated: 01/27/2022 Target Resolution Date: 05/29/2022 Goal Status: Active  Ulcer/skin breakdown will heal within 14 weeks Date Initiated: 01/27/2022 Target Resolution Date: 06/29/2022 Goal Status: Active Interventions: Assess patient/caregiver ability to obtain necessary supplies Assess patient/caregiver ability to perform ulcer/skin care regimen upon admission and as needed Assess ulceration(s) every visit Notes: Electronic Signature(s) Signed: 04/03/2022 4:23:03 PM By: Yevonne Pax RN Entered By: Yevonne Pax on 04/03/2022 08:17:58 [name] ([id number]) -------------------------------------------------------------------------------- Negative Pressure Wound Therapy Maintenance (NPWT) Details Patient Name: [name], [name] Date of Service: 04/03/2022 8:00 AM Medical Record Number: [id number] Patient Account Number: [id number] Date of Birth/Sex: June 22, 1986 (37 y.o. F) Treating RN: Yevonne Pax Primary Care Josely Moffat: Lynnell Chad Other Clinician: Referring Gabe Glace: Lynnell Chad Treating Camilo Mander/Extender: Rowan Blase in  Treatment: 9 NPWT Maintenance Performed for: Wound #1 Right Amputation Site - Below Knee Performed By: Yevonne Pax, RN Type: Other Coverage Size (sq cm): 24 Pressure Type: Constant Pressure Setting: 125 mmHG Drain Type: None Sponge/Dressing Type: Foam, Black Date Initiated: 01/23/2022 Dressing Removed: No Quantity of Sponges/Gauze Removed: 1 piece of hydrofera blue and black foam Canister Changed: No Dressing Reapplied: No Quantity of Sponges/Gauze Inserted: 2 Days On NPWT: 71 Post Procedure Diagnosis Same as Pre-procedure Electronic Signature(s) Signed: 04/03/2022 4:23:03 PM By: Yevonne Pax RN Entered By: Yevonne Pax on 04/03/2022 09:34:59 [name] ([id number]) -------------------------------------------------------------------------------- Pain Assessment Details Patient Name: [name] Date of Service: 04/03/2022 8:00 AM Medical Record Number: [id number] Patient Account Number: [id number] Date of Birth/Sex: Jan 30, 1986 (35 y.o. F) Treating RN: Yevonne Pax Primary Care Hokulani Rogel: Lynnell Chad Other Clinician: Referring Kyung Muto: Lynnell Chad Treating Aerion Bagdasarian/Extender: Rowan Blase in Treatment: 9 Active Problems Location of Pain Severity and Description of Pain Patient Has Paino No Site Locations Pain Management and Medication Current Pain Management: Electronic Signature(s) Signed: 04/03/2022 4:23:03 PM By: Yevonne Pax RN Entered By: Yevonne Pax on 04/03/2022 08:10:04 [name] ([id number]) -------------------------------------------------------------------------------- Patient/Caregiver Education Details Patient Name: [name] Date of Service: 04/03/2022 8:00 AM Medical Record Number: [id number] Patient Account Number: [id number] Date of Birth/Gender: [birth year] (35 y.o. F) Treating RN: Yevonne Pax Primary Care Physician: Lynnell Chad Other Clinician: Referring Physician: Lynnell Chad Treating Physician/Extender: Rowan Blase  in Treatment: 9 Education Assessment Education Provided To: Patient Education Topics Provided Wound/Skin Impairment: Methods: Explain/Verbal Responses: State content correctly Electronic Signature(s) Signed: 04/03/2022 4:23:03 PM By: Yevonne Pax RN Entered By: Yevonne Pax on 04/03/2022 09:35:42 [name] ([id number]) -------------------------------------------------------------------------------- Wound Assessment Details Patient Name: [name] Date of Service: 04/03/2022 8:00 AM Medical Record Number: [id number] Patient Account Number: [id number] Date of Birth/Sex: April 12, 1986 (35 y.o. F) Treating RN: Yevonne Pax Primary Care Nic Lampe: Lynnell Chad Other Clinician: Referring Camry Robello: Lynnell Chad Treating Rakeen Gaillard/Extender: Rowan Blase in Treatment: 9 Wound Status Wound Number: 1 Primary Etiology: Abscess Wound Location: Right Amputation Site - Below Knee Wound Status: Open Wounding Event: Gradually Appeared Comorbid History: Hypertension, Type II Diabetes Date Acquired: 01/14/2022 Weeks Of Treatment: 9 Clustered Wound: No Photos Wound Measurements Length: (cm) 4 Width: (cm) 6 Depth: (cm) 1 Area: (cm) 18.85 Volume: (cm) 18.85 % Reduction in Area: 30.2% % Reduction in Volume: 82.1% Epithelialization: Small (1-33%) Tunneling: No Undermining: No Wound Description Classification: Full Thickness With Exposed Support Structures Exudate Amount: Medium Exudate Type: Serosanguineous Exudate Color: red, brown Foul Odor After Cleansing: No Slough/Fibrino Yes Wound Bed Granulation Amount: Large (67-100%) Exposed Structure Granulation Quality: Red, Pink Fascia Exposed: No Necrotic Amount: None Present (0%) Fat Layer (Subcutaneous Tissue) Exposed: Yes Tendon Exposed: No Muscle Exposed: No Joint Exposed: No Bone Exposed: No Treatment Notes Wound #1 (Amputation Site - Below  Knee) Wound Laterality: Right Cleanser Peri-Wound Care Topical Primary  Dressing [name], [name] ([id number]) Secondary Dressing Secured With Compression Wrap Compression Stockings Add-Ons Electronic Signature(s) Signed: 04/03/2022 4:23:03 PM By: Yevonne Pax RN Entered By: Yevonne Pax on 04/03/2022 08:17:40 [name] ([id number]) -------------------------------------------------------------------------------- Vitals Details Patient Name: [name] Date of Service: 04/03/2022 8:00 AM Medical Record Number: [id number] Patient Account Number: [id number] Date of Birth/Sex: 03/16/1986 (35 y.o. F) Treating RN: Yevonne Pax Primary Care Tabius Rood: Lynnell Chad Other Clinician: Referring Shayonna Ocampo: Lynnell Chad Treating Chanetta Moosman/Extender: Rowan Blase in Treatment: 9 Vital Signs Time Taken: 08:09 Temperature (F): 98.4 Height (in): 67 Pulse (bpm): 105 Weight (lbs): 219 Respiratory Rate (breaths/min): 18 Body Mass Index (BMI): 34.3 Blood Pressure (mmHg): 132/88 Reference Range: 80 - 120 mg / dl Electronic Signature(s) Signed: 04/03/2022 4:23:03 PM By: Yevonne Pax RN Entered By: Yevonne Pax on 04/03/2022 08:09:54

## 2022-04-06 ENCOUNTER — Ambulatory Visit: Payer: Medicaid Other

## 2022-04-07 DIAGNOSIS — E11622 Type 2 diabetes mellitus with other skin ulcer: Secondary | ICD-10-CM | POA: Diagnosis not present

## 2022-04-07 NOTE — Progress Notes (Signed)
BARRIE, SIGMUND ([id number]) Visit Report for 04/07/2022 Physician Orders Details Patient Name: [name], [name] Date of Service: 04/07/2022 8:00 AM Medical Record Number: [id number] Patient Account Number: [id number] Date of Birth/Sex: Nov 25, 1985 (36 y.o. F) Treating RN: Angelina Pih Primary Care Provider: Lynnell Chad Other Clinician: Referring Provider: Lynnell Chad Treating Provider/Extender: Rowan Blase in Treatment: 10 Verbal / Phone Orders: No Diagnosis Coding Follow-up Appointments o Return Appointment in 1 week. o Nurse Visit as needed - thursday Bathing/ Shower/ Hygiene o No tub bath. Negative Pressure Wound Therapy o Wound VAC settings at continuous pressure. Use foam to wound cavity. Please order WHITE foam to fill any tunnel/s and/or undermining when necessary. Change VAC dressing 2 X WEEK. Change canister as indicated when full. o Number of foam/gauze pieces used in the dressing = - hydrofera blue to wound bed and area with depth , then 1 piece black foam , o Other: - wet to dry if wound vac malfunctions Wound Treatment Electronic Signature(s) Signed: 04/07/2022 12:40:15 PM By: Angelina Pih Signed: 04/07/2022 5:04:18 PM By: Lenda Kelp PA-C Entered By: Angelina Pih on 04/07/2022 12:40:14 [name] ([id number]) -------------------------------------------------------------------------------- SuperBill Details Patient Name: [name] Date of Service: 04/07/2022 Medical Record Number: [id number] Patient Account Number: [id number] Date of Birth/Sex: March 22, 1986 (35 y.o. 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 E11.43 Type 2 diabetes mellitus with diabetic autonomic (poly)neuropathy I10 Essential (primary) hypertension Facility Procedures CPT4 Code: 32671245 Description: [phone] - WOUND VAC-50 SQ CM OR LESS Modifier: Quantity: 1 CPT4 Code: Description: ICD-10 Diagnosis Description T81.31XA Disruption of external operation (surgical) wound, not elsewhere classifie E11.622 Type 2 diabetes mellitus with other skin ulcer Modifier: d, initial encounter Quantity: Physician Procedures CPT4 Code: 3382505 Description: 97605 - WC PHYS TX WOUND VAC < 50 SQ CM Modifier: Quantity: 1 CPT4 Code: Description: ICD-10 Diagnosis Description T81.31XA Disruption of external operation (surgical) wound, not elsewhere classified, E11.622 Type 2 diabetes mellitus with other skin ulcer Modifier: initial encounter Quantity: Electronic Signature(s) Signed: 04/07/2022 12:41:04 PM By: Angelina Pih Signed: 04/07/2022 5:04:18 PM By: Lenda Kelp PA-C Entered By: Angelina Pih on 04/07/2022 12:41:04

## 2022-04-07 NOTE — Progress Notes (Signed)
LEAR, CARSTENS ([id number]) Visit Report for 04/07/2022 Arrival Information Details Patient Name: [name], [name] Date of Service: 04/07/2022 8:00 AM Medical Record Number: [id number] Patient Account Number: [id number] Date of Birth/Sex: 1986-08-22 (36 y.o. F) Treating RN: Angelina Pih Primary Care Jakiyah Stepney: Lynnell Chad Other Clinician: Referring Oswaldo Cueto: Lynnell Chad Treating Shayleen Eppinger/Extender: Rowan Blase in Treatment: 10 Visit Information History Since Last Visit Added or deleted any medications: No Patient Arrived: Wheel Chair Any new allergies or adverse reactions: No Arrival Time: 08:01 Had a fall or experienced change in No Accompanied By: self activities of daily living that may affect Transfer Assistance: None risk of falls: Patient Identification Verified: Yes Hospitalized since last visit: No Secondary Verification Process Completed: Yes Has Dressing in Place as Prescribed: Yes Patient Requires Transmission-Based Precautions: No Pain Present Now: No Patient Has Alerts: Yes Electronic Signature(s) Signed: 04/07/2022 4:30:05 PM By: Angelina Pih Entered By: Angelina Pih on 04/07/2022 08:02:51 [name] ([id number]) -------------------------------------------------------------------------------- Clinic Level of Care Assessment Details Patient Name: [name] Date of Service: 04/07/2022 8:00 AM Medical Record Number: [id number] Patient Account Number: [id number] Date of Birth/Sex: 19-Sep-1986 (35 y.o. F) Treating RN: Angelina Pih Primary Care Zamira Hickam: Lynnell Chad Other Clinician: Referring Ireene Ballowe: Lynnell Chad Treating Dylan Ruotolo/Extender: Rowan Blase in Treatment: 10 Clinic Level of Care Assessment Items TOOL 1 Quantity Score []  - Use when EandM and Procedure is performed on INITIAL visit 0 ASSESSMENTS - Nursing Assessment / Reassessment []  - General Physical Exam (combine w/ comprehensive assessment (listed just below) when  performed on new 0 pt. evals) []  - 0 Comprehensive Assessment (HX, ROS, Risk Assessments, Wounds Hx, etc.) ASSESSMENTS - Wound and Skin Assessment / Reassessment []  - Dermatologic / Skin Assessment (not related to wound area) 0 ASSESSMENTS - Ostomy and/or Continence Assessment and Care []  - Incontinence Assessment and Management 0 []  - 0 Ostomy Care Assessment and Management (repouching, etc.) PROCESS - Coordination of Care []  - Simple Patient / Family Education for ongoing care 0 []  - 0 Complex (extensive) Patient / Family Education for ongoing care []  - 0 Staff obtains , Records, Test Results / Process Orders []  - 0 Staff telephones HHA, Nursing Homes / Clarify orders / etc []  - 0 Routine Transfer to another Facility (non-emergent condition) []  - 0 Routine Hospital Admission (non-emergent condition) []  - 0 New Admissions / / Ordering NPWT, Apligraf, etc. []  - 0 Emergency Hospital Admission (emergent condition) PROCESS - Special Needs []  - Pediatric / Minor Patient Management 0 []  - 0 Isolation Patient Management []  - 0 Hearing / Language / Visual special needs []  - 0 Assessment of Community assistance (transportation, D/C planning, etc.) []  - 0 Additional assistance / Altered mentation []  - 0 Support Surface(s) Assessment (bed, cushion, seat, etc.) INTERVENTIONS - Miscellaneous []  - External ear exam 0 []  - 0 Patient Transfer (multiple staff / / Similar devices) []  - 0 Simple Staple / Suture removal (25 or less) []  - 0 Complex Staple / Suture removal (26 or more) []  - 0 Hypo/Hyperglycemic Management (do not check if billed separately) []  - 0 Ankle / Brachial Index (ABI) - do not check if billed separately Has the patient been seen at the hospital within the last three years: Yes Total Score: 0 Level Of Care: ____ ( ) Electronic Signature(s) Signed: 04/07/2022 4:30:05 PM By: Entered By: on 04/07/2022 12:40:42 Chiropractor ( ) -------------------------------------------------------------------------------- Encounter Discharge Information Details Patient Name: Date of Service: 04/07/2022 8:00 AM  Medical Record Number: [id number] Patient Account Number: [id number] Date of Birth/Sex: 01/10/1986 (36 y.o. F) Treating RN: Angelina Pih Primary Care Waleska Buttery: Lynnell Chad Other Clinician: Referring Breion Novacek: Lynnell Chad Treating Demitris Pokorny/Extender: Rowan Blase in Treatment: 10 Encounter Discharge Information Items Discharge Condition: Stable Ambulatory Status: Wheelchair Discharge Destination: Home Transportation: Private Auto Accompanied By: self Schedule Follow-up Appointment: Yes Clinical Summary of Care: Electronic Signature(s) Signed: 04/07/2022 12:40:36 PM By: Angelina Pih Entered By: Angelina Pih on 04/07/2022 12:40:36 [name] ([id number]) -------------------------------------------------------------------------------- Negative Pressure Wound Therapy Maintenance (NPWT) Details Patient Name: [name], [name] Date of Service: 04/07/2022 8:00 AM Medical Record Number: [id number] Patient Account Number: [id number] Date of Birth/Sex: March 17, 1986 (35 y.o. F) Treating RN: Angelina Pih Primary Care Samirah Scarpati: Lynnell Chad Other Clinician: Referring Shahram Alexopoulos: Lynnell Chad Treating Anntionette Madkins/Extender: Rowan Blase in Treatment: 10 NPWT Maintenance Performed for: Wound #1 Right Amputation Site - Below Knee Performed By: Angelina Pih, RN Type: Other Coverage Size (sq cm): 24 Pressure Type: Constant Pressure Setting: 125 mmHG Drain Type: None Sponge/Dressing Type: Foam, Black Date Initiated: 01/23/2022 Dressing Removed: No Quantity of Sponges/Gauze Removed: 1 Canister Changed: No Dressing Reapplied: No Quantity of Sponges/Gauze Inserted: 1 Respones To Treatment: tolerated  well Days On NPWT: 75 Electronic Signature(s) Signed: 04/07/2022 12:39:44 PM By: Angelina Pih Entered By: Angelina Pih on 04/07/2022 12:39:44 [name] ([id number]) -------------------------------------------------------------------------------- Wound Assessment Details Patient Name: [name] Date of Service: 04/07/2022 8:00 AM Medical Record Number: [id number] Patient Account Number: [id number] Date of Birth/Sex: 1986/01/18 (36 y.o. F) Treating RN: Angelina Pih Primary Care Judea Fennimore: Lynnell Chad Other Clinician: Referring Bera Pinela: Lynnell Chad Treating Caitriona Sundquist/Extender: Rowan Blase in Treatment: 10 Wound Status Wound Number: 1 Primary Etiology: Abscess Wound Location: Right Amputation Site - Below Knee Wound Status: Open Wounding Event: Gradually Appeared Comorbid History: Hypertension, Type II Diabetes Date Acquired: 01/14/2022 Weeks Of Treatment: 10 Clustered Wound: No Wound Measurements Length: (cm) 4 Width: (cm) 6 Depth: (cm) 1 Area: (cm) 18.85 Volume: (cm) 18.85 % Reduction in Area: 30.2% % Reduction in Volume: 82.1% Epithelialization: Small (1-33%) Wound Description Classification: Full Thickness With Exposed Support Structures Exudate Amount: Medium Exudate Type: Serosanguineous Exudate Color: red, brown Foul Odor After Cleansing: No Slough/Fibrino Yes Wound Bed Granulation Amount: Large (67-100%) Exposed Structure Granulation Quality: Red, Pink Fascia Exposed: No Necrotic Amount: None Present (0%) Fat Layer (Subcutaneous Tissue) Exposed: Yes Tendon Exposed: No Muscle Exposed: No Joint Exposed: No Bone Exposed: No Treatment Notes Wound #1 (Amputation Site - Below Knee) Wound Laterality: Right Cleanser Peri-Wound Care Topical Primary Dressing Secondary Dressing Secured With Compression Wrap Compression Stockings Add-Ons Electronic Signature(s) Signed: 04/07/2022 4:30:05 PM By: Angelina Pih Entered By: Angelina Pih on 04/07/2022 08:03:04

## 2022-04-10 ENCOUNTER — Encounter: Payer: Medicaid Other | Admitting: Physician Assistant

## 2022-04-10 DIAGNOSIS — E11622 Type 2 diabetes mellitus with other skin ulcer: Secondary | ICD-10-CM | POA: Diagnosis not present

## 2022-04-11 NOTE — Progress Notes (Signed)
[name] ([id number]) Visit Report for 04/10/2022 Chief Complaint Document Details Patient Name: [name], [name] Date of Service: 04/10/2022 8:15 AM Medical Record Number: [id number] Patient Account Number: [id number] Date of Birth/Sex: [birth year] (36 y.o. F) Treating RN: Primary Care Provider: Jac Canavan Other Clinician: Referring Provider: Jac Canavan Treating Provider/Extender: Skipper Cliche in Treatment: 10 Information Obtained from: Patient Chief Complaint Right BKA Amputation Site Disruption Electronic Signature(s) Signed: 04/10/2022 4:34:19 PM By: Worthy Keeler PA-C Entered By: Worthy Keeler on 04/10/2022 08:24:36 [name] ([id number]) -------------------------------------------------------------------------------- Debridement Details Patient Name: [name] Date of Service: 04/10/2022 8:15 AM Medical Record Number: [id number] Patient Account Number: [id number] Date of Birth/Sex: 13-Mar-1986 (35 y.o. F) Treating RN: Cornell Barman Primary Care Provider: Jac Canavan Other Clinician: Cornell Barman Referring Provider: Jac Canavan Treating Provider/Extender: Skipper Cliche in Treatment: 10 Debridement Performed for Wound #1 Right Amputation Site - Below Knee Assessment: Performed By: Physician Tommie Sams., PA-C Debridement Type: Chemical/Enzymatic/Mechanical Agent Used: saline and gauze Level of Consciousness (Pre- Awake and Alert procedure): Pre-procedure Verification/Time Out Yes - 08:53 Taken: Instrument: Other : saline and gauze Bleeding: Minimum Hemostasis Achieved: Pressure Response to Treatment: Procedure was tolerated well Level of Consciousness (Post- Awake and Alert procedure): Post Debridement Measurements of Total Wound Length: (cm) 4 Width: (cm) 5 Depth: (cm) 0.2 Volume: (cm) 3.142 Character of Wound/Ulcer Post Debridement: Requires Further Debridement Post Procedure Diagnosis Same as Pre-procedure Electronic  Signature(s) Signed: 04/10/2022 4:34:19 PM By: Worthy Keeler PA-C Signed: 04/11/2022 3:46:53 PM By: Gretta Cool, BSN, RN, CWS, [name] RN, BSN Entered By: Gretta Cool, BSN, RN, CWS, [name] on 04/10/2022 08:54:23 [name] ([id number]) -------------------------------------------------------------------------------- HPI Details Patient Name: [name] Date of Service: 04/10/2022 8:15 AM Medical Record Number: [id number] Patient Account Number: [id number] Date of Birth/Sex: 11/06/1985 (35 y.o. F) Treating RN: Primary Care Provider: Jac Canavan Other Clinician: Referring Provider: Jac Canavan Treating Provider/Extender: Skipper Cliche in Treatment: 10 History of Present Illness HPI Description: 01-27-2022 upon evaluation today patient appears to be doing somewhat poorly in regard to her right below-knee amputation. This unfortunately developed a abscess internally following the initial amputation which was January 2023. Subsequently she was getting very close to starting to work on the prosthesis when she had an area that somewhat bulged out ended up being an abscess and the incision and drainage was performed April 22. Subsequently she did have antibiotic beads placed in a wound VAC after this was cleared out. Her most recent reported hemoglobin A1c was 6.4. With that being said the patient tells me that her blood sugars seem to be under pretty good control as best she can tell at this point. She also tells me that the wound VAC seems to be functioning appropriately although upon evaluation the did not appear to be anything tucked into the region of undermining and depth in the roughly 9-12 o'clock location based on what we saw today. This is also where the antibiotic beads are and I can see those in place. I do believe that she is getting need to have the wound VAC tucked down into this location. Patient does have a history of diabetes mellitus type 2, diabetic neuropathy, and hypertension. This  initial amputation occurred as a result of her having sewing needles that actually went into her heel she did not know about it started having trouble with her heel and ended up with a significant infection which was stated to be necrotizing and she subsequently had to have emergency amputation. 02-13-2022 upon evaluation today  patient appears to be doing well with regard to her wound. She has been tolerating the dressing changes without complication. The wound VAC does seem to be doing a great job. Fortunately I do not see any evidence of active infection locally or systemically at this time which is great news. She still has some of the antibiotic beads in the base of the wound. 02-20-2022 upon evaluation today patient appears to be doing well currently. There does not appear to be any evidence of active infection locally nor systemically which is great news and overall I am extremely pleased with where we stand today. I do believe that the wound VAC is doing a good job here and the color of the drainage is improved again were unsure as to whether this may have been the antibiotic beads or if there was something such as Pseudomonas causing the greenish discoloration to the drainage either way she is doing significantly better. 03-06-2022 upon evaluation today patient appears to be doing well with regard to her wound. The only issue I see is that she does have a little bit of a pocket of fluid that collected in the deeper section everything else is actually measuring smaller and looks better. This was a point where I really did expect this was been a turnaround and start doing a lot better. I think we are especially seeing that right now. 03-20-2022 upon evaluation today patient appears to be doing well with regard to her wound in fact this is actually significantly improved compared to last time I saw her. Fortunately I do not see any evidence of active infection at this time. No fevers, chills, nausea,  vomiting, or diarrhea. 03-27-2022 upon evaluation today patient appears to be doing excellent in regard to her wound. This is showing signs of improvement which is great news and overall I am extremely pleased with where we stand currently. I do not see any evidence of active infection locally or systemically which is great news. No fevers, chills, nausea, vomiting, or diarrhea. 04-03-2022 upon evaluation today patient's wound is showing signs of significant improvement. I am still very pleased with where we stand and how things are going. I think that she is really making excellent progress which is great news. I do not see any signs of active infection at this time. 04-10-2022 upon evaluation today patient's wound actually showed signs of good granulation and epithelization at this point. Fortunately I do not see any evidence of active infection at this time which is great news and overall I am extremely pleased with where things stand currently. No fevers, chills, nausea, vomiting, or diarrhea. Electronic Signature(s) Signed: 04/10/2022 8:56:53 AM By: Worthy Keeler PA-C Entered By: Worthy Keeler on 04/10/2022 08:56:53 [name] ([id number]) -------------------------------------------------------------------------------- Physical Exam Details Patient Name: [name] Date of Service: 04/10/2022 8:15 AM Medical Record Number: [id number] Patient Account Number: [id number] Date of Birth/Sex: 10-Aug-1986 (35 y.o. F) Treating RN: Cornell Barman Primary Care Provider: Jac Canavan Other Clinician: Cornell Barman Referring Provider: Jac Canavan Treating Provider/Extender: Skipper Cliche in Treatment: 57 Constitutional Well-nourished and well-hydrated in no acute distress. Respiratory normal breathing without difficulty. Psychiatric this patient is able to make decisions and demonstrates good insight into disease process. Alert and Oriented x 3. pleasant and cooperative. Notes Upon  inspection patient's wound bed actually showed signs of good granulation and epithelization at this point. Fortunately I do not see any evidence of infection locally or systemically which is great news and overall I am  extremely happy with where we stand at this point. Electronic Signature(s) Signed: 04/10/2022 8:57:08 AM By: Worthy Keeler PA-C Entered By: Worthy Keeler on 04/10/2022 08:57:08 [name] ([id number]) -------------------------------------------------------------------------------- Physician Orders Details Patient Name: [name] Date of Service: 04/10/2022 8:15 AM Medical Record Number: [id number] Patient Account Number: [id number] Date of Birth/Sex: November 23, 1985 (35 y.o. F) Treating RN: Cornell Barman Primary Care Provider: Jac Canavan Other Clinician: Cornell Barman Referring Provider: Jac Canavan Treating Provider/Extender: Skipper Cliche in Treatment: 10 Verbal / Phone Orders: No Diagnosis Coding ICD-10 Coding Code Description E11.622 Type 2 diabetes mellitus with other skin ulcer T81.31XA Disruption of external operation (surgical) wound, not elsewhere classified, initial encounter L97.812 Non-pressure chronic ulcer of other part of right lower leg with fat layer exposed E11.43 Type 2 diabetes mellitus with diabetic autonomic (poly)neuropathy I10 Essential (primary) hypertension Follow-up Appointments o Return Appointment in 1 week. Bathing/ Shower/ Hygiene o May shower; gently cleanse wound with antibacterial soap, rinse and pat dry prior to dressing wounds Negative Pressure Wound Therapy o Discontinue NPWT. Wound Treatment Wound #1 - Amputation Site - Below Knee Wound Laterality: Right Cleanser: Byram Ancillary Kit - 15 Day Supply 3 x Per Week/30 Days Discharge Instructions: Use supplies as instructed; Kit contains: (15) Saline Bullets; (15) 3x3 Gauze; 15 pr Gloves Primary Dressing: Prisma 4.34 (in) (DME) (Generic) 3 x Per Week/30  Days Discharge Instructions: Moisten w/normal saline or sterile water; Cover wound as directed. Do not remove from wound bed. Secondary Dressing: ABD Pad 5x9 (in/in) (DME) (Generic) 3 x Per Week/30 Days Discharge Instructions: Cover with ABD pad Secondary Dressing: (SILCONE BORDER) Zetuvit Plus SILICONE BORDER Dressing 5x5 (in/in) (DME) (Generic) 3 x Per Week/30 Days Discharge Instructions: Please do not put silicone bordered dressings under wraps. Use non-bordered dressing only. Secured With: Medipore Tape - 52M Medipore H Soft Cloth Surgical Tape, 2x2 (in/yd) 3 x Per Week/30 Days Electronic Signature(s) Signed: 04/13/2022 2:26:09 PM By: Gretta Cool, BSN, RN, CWS, [name] RN, BSN Signed: 04/13/2022 4:35:29 PM By: Worthy Keeler PA-C Previous Signature: 04/10/2022 9:20:37 AM Version By: Gretta Cool BSN, RN, CWS, [name] RN, BSN Previous Signature: 04/10/2022 4:34:19 PM Version By: Worthy Keeler PA-C Entered By: Gretta Cool, BSN, RN, CWS, [name] on 04/13/2022 13:16:05 [name] ([id number]) -------------------------------------------------------------------------------- Problem List Details Patient Name: [name] Date of Service: 04/10/2022 8:15 AM Medical Record Number: [id number] Patient Account Number: [id number] Date of Birth/Sex: Sep 06, 1986 (35 y.o. F) Treating RN: Primary Care Provider: Jac Canavan Other Clinician: Referring Provider: Jac Canavan Treating Provider/Extender: Skipper Cliche in Treatment: 10 Active Problems ICD-10 Encounter Code Description Active Date MDM Diagnosis E11.622 Type 2 diabetes mellitus with other skin ulcer 01/27/2022 No Yes T81.31XA Disruption of external operation (surgical) wound, not elsewhere 01/27/2022 No Yes classified, initial encounter L97.812 Non-pressure chronic ulcer of other part of right lower leg with fat layer 01/27/2022 No Yes exposed E11.43 Type 2 diabetes mellitus with diabetic autonomic (poly)neuropathy 01/27/2022 No Yes I10 Essential (primary)  hypertension 01/27/2022 No Yes Inactive Problems Resolved Problems Electronic Signature(s) Signed: 04/10/2022 4:34:19 PM By: Worthy Keeler PA-C Entered By: Worthy Keeler on 04/10/2022 08:24:25 [name] ([id number]) -------------------------------------------------------------------------------- Progress Note Details Patient Name: [name] Date of Service: 04/10/2022 8:15 AM Medical Record Number: [id number] Patient Account Number: [id number] Date of Birth/Sex: 14-Dec-1985 (35 y.o. F) Treating RN: Cornell Barman Primary Care Provider: Jac Canavan Other Clinician: Cornell Barman Referring Provider: Jac Canavan Treating Provider/Extender: Skipper Cliche in Treatment: 10 Subjective Chief Complaint Information obtained from Patient Right BKA  Amputation Site Disruption History of Present Illness (HPI) 01-27-2022 upon evaluation today patient appears to be doing somewhat poorly in regard to her right below-knee amputation. This unfortunately developed a abscess internally following the initial amputation which was January 2023. Subsequently she was getting very close to starting to work on the prosthesis when she had an area that somewhat bulged out ended up being an abscess and the incision and drainage was performed April 22. Subsequently she did have antibiotic beads placed in a wound VAC after this was cleared out. Her most recent reported hemoglobin A1c was 6.4. With that being said the patient tells me that her blood sugars seem to be under pretty good control as best she can tell at this point. She also tells me that the wound VAC seems to be functioning appropriately although upon evaluation the did not appear to be anything tucked into the region of undermining and depth in the roughly 9-12 o'clock location based on what we saw today. This is also where the antibiotic beads are and I can see those in place. I do believe that she is getting need to have the wound VAC tucked  down into this location. Patient does have a history of diabetes mellitus type 2, diabetic neuropathy, and hypertension. This initial amputation occurred as a result of her having sewing needles that actually went into her heel she did not know about it started having trouble with her heel and ended up with a significant infection which was stated to be necrotizing and she subsequently had to have emergency amputation. 02-13-2022 upon evaluation today patient appears to be doing well with regard to her wound. She has been tolerating the dressing changes without complication. The wound VAC does seem to be doing a great job. Fortunately I do not see any evidence of active infection locally or systemically at this time which is great news. She still has some of the antibiotic beads in the base of the wound. 02-20-2022 upon evaluation today patient appears to be doing well currently. There does not appear to be any evidence of active infection locally nor systemically which is great news and overall I am extremely pleased with where we stand today. I do believe that the wound VAC is doing a good job here and the color of the drainage is improved again were unsure as to whether this may have been the antibiotic beads or if there was something such as Pseudomonas causing the greenish discoloration to the drainage either way she is doing significantly better. 03-06-2022 upon evaluation today patient appears to be doing well with regard to her wound. The only issue I see is that she does have a little bit of a pocket of fluid that collected in the deeper section everything else is actually measuring smaller and looks better. This was a point where I really did expect this was been a turnaround and start doing a lot better. I think we are especially seeing that right now. 03-20-2022 upon evaluation today patient appears to be doing well with regard to her wound in fact this is actually significantly  improved compared to last time I saw her. Fortunately I do not see any evidence of active infection at this time. No fevers, chills, nausea, vomiting, or diarrhea. 03-27-2022 upon evaluation today patient appears to be doing excellent in regard to her wound. This is showing signs of improvement which is great news and overall I am extremely pleased with where we stand currently. I do not  see any evidence of active infection locally or systemically which is great news. No fevers, chills, nausea, vomiting, or diarrhea. 04-03-2022 upon evaluation today patient's wound is showing signs of significant improvement. I am still very pleased with where we stand and how things are going. I think that she is really making excellent progress which is great news. I do not see any signs of active infection at this time. 04-10-2022 upon evaluation today patient's wound actually showed signs of good granulation and epithelization at this point. Fortunately I do not see any evidence of active infection at this time which is great news and overall I am extremely pleased with where things stand currently. No fevers, chills, nausea, vomiting, or diarrhea. Objective Constitutional Well-nourished and well-hydrated in no acute distress. Vitals Time Taken: 8:18 AM, Height: 67 in, Weight: 219 lbs, BMI: 34.3, Temperature: 98.3 F, Pulse: 103 bpm, Respiratory Rate: 16 breaths/min, Blood Pressure: 133/88 mmHg. Delaluz, Samiha ([id number]) Respiratory normal breathing without difficulty. Psychiatric this patient is able to make decisions and demonstrates good insight into disease process. Alert and Oriented x 3. pleasant and cooperative. General Notes: Upon inspection patient's wound bed actually showed signs of good granulation and epithelization at this point. Fortunately I do not see any evidence of infection locally or systemically which is great news and overall I am extremely happy with where we stand at this  point. Integumentary (Hair, Skin) Wound #1 status is Open. Original cause of wound was Gradually Appeared. The date acquired was: 01/14/2022. The wound has been in treatment 10 weeks. The wound is located on the Right Amputation Site - Below Knee. The wound measures 4cm length x 5cm width x 0.2cm depth; 15.708cm^2 area and 3.142cm^3 volume. There is Fat Layer (Subcutaneous Tissue) exposed. There is tunneling at 11:00 with a maximum distance of 1cm. There is a medium amount of serosanguineous drainage noted. The wound margin is flat and intact. There is large (67-100%) red granulation within the wound bed. There is no necrotic tissue within the wound bed. Assessment Active Problems ICD-10 Type 2 diabetes mellitus with other skin ulcer Disruption of external operation (surgical) wound, not elsewhere classified, initial encounter Non-pressure chronic ulcer of other part of right lower leg with fat layer exposed Type 2 diabetes mellitus with diabetic autonomic (poly)neuropathy Essential (primary) hypertension Procedures Wound #1 Pre-procedure diagnosis of Wound #1 is an Abscess located on the Right Amputation Site - Below Knee . There was a Chemical/Enzymatic/Mechanical debridement performed by Tommie Sams., PA-C. With the following instrument(s): saline and gauze. Other agent used was saline and gauze. A time out was conducted at 08:53, prior to the start of the procedure. A Minimum amount of bleeding was controlled with Pressure. The procedure was tolerated well. Post Debridement Measurements: 4cm length x 5cm width x 0.2cm depth; 3.142cm^3 volume. Character of Wound/Ulcer Post Debridement requires further debridement. Post procedure Diagnosis Wound #1: Same as Pre-Procedure Plan Follow-up Appointments: Return Appointment in 1 week. Bathing/ Shower/ Hygiene: May shower; gently cleanse wound with antibacterial soap, rinse and pat dry prior to dressing wounds WOUND #1: - Amputation Site -  Below Knee Wound Laterality: Right Primary Dressing: Prisma 4.34 (in) (DME) (Generic) 1 x Per Day/30 Days Discharge Instructions: Moisten w/normal saline or sterile water; Cover wound as directed. Do not remove from wound bed. Secondary Dressing: (SILCONE BORDER) Zetuvit Plus SILICONE BORDER Dressing 5x5 (in/in) (DME) (Generic) 1 x Per Day/30 Days Discharge Instructions: Please do not put silicone bordered dressings under wraps. Use non-bordered dressing only.  1. I would recommend that we going continue with the wound care measures as before we will discontinue or at least put the wound VAC on hold for now. We are going to initiate treatment with a silver collagen dressing and a bordered foam to cover. 2. Also can recommend that we have the patient continue to change this daily for the time being at least over the next week. After that time depending on how things are doing we may drop this back to every 2 days or 3 times a week but for now I do want it more frequent. This is due to the amount of drainage. 3. I am also going to suggest the patient should continue to monitor for any signs of worsening or infection if anything changes she should let me know. We will be using Tubigrip over top of this for today and then subsequently she can switch to her shrinker at home which I think would be ideal. Stacks, Danaya ([id number]) We will see patient back for reevaluation in 1 week here in the clinic. If anything worsens or changes patient will contact our office for additional recommendations. Electronic Signature(s) Signed: 04/10/2022 8:58:15 AM By: Worthy Keeler PA-C Entered By: Worthy Keeler on 04/10/2022 08:58:15 [name] ([id number]) -------------------------------------------------------------------------------- SuperBill Details Patient Name: [name] Date of Service: 04/10/2022 Medical Record Number: [id number] Patient Account Number: [id number] Date of Birth/Sex: 05/19/1986  (35 y.o. F) Treating RN: Cornell Barman Primary Care Provider: Jac Canavan Other Clinician: Cornell Barman Referring Provider: Jac Canavan Treating Provider/Extender: Skipper Cliche in Treatment: 10 Diagnosis Coding ICD-10 Codes Code Description E11.622 Type 2 diabetes mellitus with other skin ulcer T81.31XA Disruption of external operation (surgical) wound, not elsewhere classified, initial encounter L97.812 Non-pressure chronic ulcer of other part of right lower leg with fat layer exposed E11.43 Type 2 diabetes mellitus with diabetic autonomic (poly)neuropathy I10 Essential (primary) hypertension Facility Procedures CPT4 Code: [id number] Description: 99213 - WOUND CARE VISIT-LEV 3 EST PT Modifier: Quantity: 1 Physician Procedures CPT4 Code: [id number] Description: 99213 - WC PHYS LEVEL 3 - EST PT Modifier: Quantity: 1 CPT4 Code: Description: ICD-10 Diagnosis Description E11.622 Type 2 diabetes mellitus with other skin ulcer T81.31XA Disruption of external operation (surgical) wound, not elsewhere classifi L97.812 Non-pressure chronic ulcer of other part of right lower leg with  fat laye E11.43 Type 2 diabetes mellitus with diabetic autonomic (poly)neuropathy Modifier: ed, initial encounter r exposed Quantity: Electronic Signature(s) Signed: 04/10/2022 8:58:28 AM By: Worthy Keeler PA-C Entered By: Worthy Keeler on 04/10/2022 08:58:28

## 2022-04-11 NOTE — Progress Notes (Signed)
SIMRAT, KENDRICK ([id number]) Visit Report for 04/10/2022 Arrival Information Details Patient Name: [name], [name] Date of Service: 04/10/2022 8:15 AM Medical Record Number: [id number] Patient Account Number: [id number] Date of Birth/Sex: March 26, 1986 (36 y.o. F) Treating RN: Huel Coventry Primary Care Odus Clasby: Lynnell Chad Other Clinician: Referring Willie Loy: Lynnell Chad Treating Joyous Gleghorn/Extender: Rowan Blase in Treatment: 10 Visit Information History Since Last Visit Added or deleted any medications: No Patient Arrived: Wheel Chair Has Dressing in Place as Prescribed: Yes Arrival Time: 08:15 Pain Present Now: No Accompanied By: self Transfer Assistance: None Patient Identification Verified: Yes Secondary Verification Process Completed: Yes Patient Requires Transmission-Based Precautions: No Patient Has Alerts: Yes Electronic Signature(s) Signed: 04/11/2022 3:46:53 PM By: Elliot Gurney, BSN, RN, CWS, [name] RN, BSN Entered By: Elliot Gurney, BSN, RN, CWS, [name] on 04/10/2022 08:18:22 [name] ([id number]) -------------------------------------------------------------------------------- Clinic Level of Care Assessment Details Patient Name: [name] Date of Service: 04/10/2022 8:15 AM Medical Record Number: [id number] Patient Account Number: [id number] Date of Birth/Sex: 06-22-1986 (35 y.o. F) Treating RN: Huel Coventry Primary Care Latrease Kunde: Lynnell Chad Other Clinician: Huel Coventry Referring Philip Eckersley: Lynnell Chad Treating Roshelle Traub/Extender: Rowan Blase in Treatment: 10 Clinic Level of Care Assessment Items TOOL 4 Quantity Score []  - Use when only an EandM is performed on FOLLOW-UP visit 0 ASSESSMENTS - Nursing Assessment / Reassessment X - Reassessment of Co-morbidities (includes updates in patient status) 1 10 X- 1 5 Reassessment of Adherence to Treatment Plan ASSESSMENTS - Wound and Skin Assessment / Reassessment X - Simple Wound Assessment / Reassessment - one wound 1  5 []  - 0 Complex Wound Assessment / Reassessment - multiple wounds []  - 0 Dermatologic / Skin Assessment (not related to wound area) ASSESSMENTS - Focused Assessment []  - Circumferential Edema Measurements - multi extremities 0 []  - 0 Nutritional Assessment / Counseling / Intervention []  - 0 Lower Extremity Assessment (monofilament, tuning fork, pulses) []  - 0 Peripheral Arterial Disease Assessment (using hand held doppler) ASSESSMENTS - Ostomy and/or Continence Assessment and Care []  - Incontinence Assessment and Management 0 []  - 0 Ostomy Care Assessment and Management (repouching, etc.) PROCESS - Coordination of Care X - Simple Patient / Family Education for ongoing care 1 15 []  - 0 Complex (extensive) Patient / Family Education for ongoing care X- 1 10 Staff obtains Consents, Records, Test Results / Process Orders []  - 0 Staff telephones HHA, Nursing Homes / Clarify orders / etc []  - 0 Routine Transfer to another Facility (non-emergent condition) []  - 0 Routine Hospital Admission (non-emergent condition) []  - 0 New Admissions / / Ordering NPWT, Apligraf, etc. []  - 0 Emergency Hospital Admission (emergent condition) X- 1 10 Simple Discharge Coordination []  - 0 Complex (extensive) Discharge Coordination PROCESS - Special Needs []  - Pediatric / Minor Patient Management 0 []  - 0 Isolation Patient Management []  - 0 Hearing / Language / Visual special needs []  - 0 Assessment of Community assistance (transportation, D/C planning, etc.) []  - 0 Additional assistance / Altered mentation []  - 0 Support Surface(s) Assessment (bed, cushion, seat, etc.) INTERVENTIONS - Wound Cleansing / Measurement Wojtkiewicz, [name] ( ) []  - 0 Simple Wound Cleansing - one wound []  - 0 Complex Wound Cleansing - multiple wounds []  - 0 Wound Imaging (photographs - any number of wounds) []  - 0 Wound Tracing (instead of photographs) []  - 0 Simple Wound  Measurement - one wound []  - 0 Complex Wound Measurement - multiple wounds INTERVENTIONS - Wound Dressings X - Small Wound Dressing one or multiple wounds 1  10 []  - 0 Medium Wound Dressing one or multiple wounds X- 1 20 Large Wound Dressing one or multiple wounds []  - 0 Application of Medications - topical X- 1 10 Application of Medications - injection INTERVENTIONS - Miscellaneous []  - External ear exam 0 []  - 0 Specimen Collection (cultures, biopsies, blood, body fluids, etc.) []  - 0 Specimen(s) / Culture(s) sent or taken to Lab for analysis []  - 0 Patient Transfer (multiple staff / / Similar devices) []  - 0 Simple Staple / Suture removal (25 or less) []  - 0 Complex Staple / Suture removal (26 or more) []  - 0 Hypo / Hyperglycemic Management (close monitor of Blood Glucose) []  - 0 Ankle / Brachial Index (ABI) - do not check if billed separately X- 1 5 Vital Signs Has the patient been seen at the hospital within the last three years: Yes Total Score: 100 Level Of Care: New/Established - Level 3 Electronic Signature(s) Signed: 04/11/2022 3:46:53 PM By: , BSN, RN, CWS, [name] RN, BSN Entered By: , BSN, RN, CWS, [name] on 04/10/2022 08:56:06 (Nurse, adult) -------------------------------------------------------------------------------- Encounter Discharge Information Details Patient Name: Date of Service: 04/10/2022 8:15 AM Medical Record Number: Patient Account Number: Date of Birth/Sex: August 12, 1986 (35 y.o. F) Treating RN: Elliot Gurney Primary Care Donavyn Fecher: 06/11/2022 Other Clinician: Dewitt Howell Referring Telesa Jeancharles: [id number] Treating Mikael Skoda/Extender: [name] in Treatment: 10 Encounter Discharge Information Items Post Procedure Vitals Discharge Condition: Stable Temperature (F): 98.3 Ambulatory Status: Wheelchair Pulse (bpm): 103 Discharge Destination: Home Respiratory Rate  (breaths/min): 16 Transportation: Private Auto Blood Pressure (mmHg): 133/88 Accompanied By: self Schedule Follow-up Appointment: Yes Clinical Summary of Care: Electronic Signature(s) Signed: 04/11/2022 3:46:53 PM By: [id number], BSN, RN, CWS, [name] RN, BSN Entered By: [id number], BSN, RN, CWS, [name] on 04/10/2022 08:57:36 10-05-1973 (Huel Coventry) -------------------------------------------------------------------------------- Lower Extremity Assessment Details Patient Name: Lynnell Chad Date of Service: 04/10/2022 8:15 AM Medical Record Number: Lynnell Chad Patient Account Number: Rowan Blase Date of Birth/Sex: 1985/11/16 (35 y.o. F) Treating RN: Elliot Gurney Primary Care Renise Gillies: 06/11/2022 Other Clinician: Referring Shay Jhaveri: [name] Treating Kayston Jodoin/Extender: [id number] in Treatment: 10 Notes Right BKA Electronic Signature(s) Signed: 04/11/2022 3:46:53 PM By: 06/11/2022, BSN, RN, CWS, [name] RN, BSN Entered By: [id number], BSN, RN, CWS, [name] on 04/10/2022 08:25:19 [birth year]) -------------------------------------------------------------------------------- Multi Wound Chart Details Patient Name: Huel Coventry Date of Service: 04/10/2022 8:15 AM Medical Record Number: Lynnell Chad Patient Account Number: Rowan Blase Date of Birth/Sex: 09-Jun-1986 (35 y.o. F) Treating RN: Elliot Gurney Primary Care Teryn Boerema: 06/11/2022 Other Clinician: Referring Ruffin Lada: [name] Treating Aanika Defoor/Extender: [id number] in Treatment: 10 Vital Signs Height(in): 67 Pulse(bpm): 103 Weight(lbs): 219 Blood Pressure(mmHg): 133/88 Body Mass Index(BMI): 34.3 Temperature(F): 98.3 Respiratory Rate(breaths/min): 16 Photos: [N/A:N/A] Wound Location: Right Amputation Site - Below Knee N/A N/A Wounding Event: Gradually Appeared N/A N/A Primary Etiology: Abscess N/A N/A Comorbid History: Hypertension, Type II Diabetes N/A N/A Date Acquired: 01/14/2022 N/A N/A Weeks of Treatment: 10  N/A N/A Wound Status: Open N/A N/A Wound Recurrence: No N/A N/A Measurements L x W x D (cm) 4x5x0.2 N/A N/A Area (cm) : 15.708 N/A N/A Volume (cm) : 3.142 N/A N/A % Reduction in Area: 41.90% N/A N/A % Reduction in Volume: 97.00% N/A N/A Position 1 (o'clock): 11 Maximum Distance 1 (cm): 1 Tunneling: Yes N/A N/A Classification: Full Thickness With Exposed N/A N/A Support Structures Exudate Amount: Large N/A N/A Exudate Type: Serosanguineous N/A N/A Exudate Color: red, brown N/A N/A Wound Margin:  Flat and Intact N/A N/A Granulation Amount: Large (67-100%) N/A N/A Granulation Quality: Red N/A N/A Necrotic Amount: None Present (0%) N/A N/A Exposed Structures: Fat Layer (Subcutaneous Tissue): N/A N/A Yes Fascia: No Tendon: No Muscle: No Joint: No Bone: No Epithelialization: Small (1-33%) N/A N/A Treatment Notes Electronic Signature(s) Signed: 04/11/2022 3:46:53 PM By: Elliot Gurney, BSN, RN, CWS, [name] RN, BSN Entered By: Elliot Gurney, BSN, RN, CWS, [name] on 04/10/2022 08:25:34 [name], [name] ([id number]) [name] ([id number]) -------------------------------------------------------------------------------- Multi-Disciplinary Care Plan Details Patient Name: [name] Date of Service: 04/10/2022 8:15 AM Medical Record Number: [id number] Patient Account Number: [id number] Date of Birth/Sex: Feb 25, 1986 (35 y.o. F) Treating RN: Huel Coventry Primary Care Dominiqua Cooner: Lynnell Chad Other Clinician: Referring Sherida Dobkins: Lynnell Chad Treating Ranette Luckadoo/Extender: Rowan Blase in Treatment: 10 Active Inactive Wound/Skin Impairment Nursing Diagnoses: Knowledge deficit related to ulceration/compromised skin integrity Goals: Patient/caregiver will verbalize understanding of skin care regimen Date Initiated: 01/27/2022 Target Resolution Date: 02/26/2022 Goal Status: Active Ulcer/skin breakdown will have a volume reduction of 30% by week 4 Date Initiated: 01/27/2022 Target Resolution Date:  03/29/2022 Goal Status: Active Ulcer/skin breakdown will have a volume reduction of 50% by week 8 Date Initiated: 01/27/2022 Target Resolution Date: 04/28/2022 Goal Status: Active Ulcer/skin breakdown will have a volume reduction of 80% by week 12 Date Initiated: 01/27/2022 Target Resolution Date: 05/29/2022 Goal Status: Active Ulcer/skin breakdown will heal within 14 weeks Date Initiated: 01/27/2022 Target Resolution Date: 06/29/2022 Goal Status: Active Interventions: Assess patient/caregiver ability to obtain necessary supplies Assess patient/caregiver ability to perform ulcer/skin care regimen upon admission and as needed Assess ulceration(s) every visit Notes: Electronic Signature(s) Signed: 04/11/2022 3:46:53 PM By: Elliot Gurney, BSN, RN, CWS, [name] RN, BSN Entered By: Elliot Gurney, BSN, RN, CWS, [name] on 04/10/2022 08:25:26 [name] ([id number]) -------------------------------------------------------------------------------- Pain Assessment Details Patient Name: [name] Date of Service: 04/10/2022 8:15 AM Medical Record Number: [id number] Patient Account Number: [id number] Date of Birth/Sex: [birth year] (35 y.o. F) Treating RN: Huel Coventry Primary Care Kylar Leonhardt: Lynnell Chad Other Clinician: Referring Anali Cabanilla: Lynnell Chad Treating Akili Cuda/Extender: Rowan Blase in Treatment: 10 Active Problems Location of Pain Severity and Description of Pain Patient Has Paino No Site Locations Pain Management and Medication Current Pain Management: Electronic Signature(s) Signed: 04/11/2022 3:46:53 PM By: Elliot Gurney, BSN, RN, CWS, [name] RN, BSN Entered By: Elliot Gurney, BSN, RN, CWS, [name] on 04/10/2022 08:18:55 [name] ([id number]) -------------------------------------------------------------------------------- Patient/Caregiver Education Details Patient Name: [name] Date of Service: 04/10/2022 8:15 AM Medical Record Number: [id number] Patient Account Number: [id number] Date of  Birth/Gender: [birth year] (35 y.o. F) Treating RN: Huel Coventry Primary Care Physician: Lynnell Chad Other Clinician: Huel Coventry Referring Physician: Lynnell Chad Treating Physician/Extender: Rowan Blase in Treatment: 10 Education Assessment Education Provided To: Patient Education Topics Provided Wound/Skin Impairment: Handouts: Caring for Your Ulcer Methods: Demonstration, Explain/Verbal Responses: State content correctly Electronic Signature(s) Signed: 04/11/2022 3:46:53 PM By: Elliot Gurney, BSN, RN, CWS, [name] RN, BSN Entered By: Elliot Gurney, BSN, RN, CWS, [name] on 04/10/2022 08:56:34 [name] ([id number]) -------------------------------------------------------------------------------- Wound Assessment Details Patient Name: [name] Date of Service: 04/10/2022 8:15 AM Medical Record Number: [id number] Patient Account Number: [id number] Date of Birth/Sex: [birth year] (35 y.o. F) Treating RN: Huel Coventry Primary Care Honore Wipperfurth: Lynnell Chad Other Clinician: Referring Deshanda Molitor: Lynnell Chad Treating Elanor Cale/Extender: Rowan Blase in Treatment: 10 Wound Status Wound Number: 1 Primary Etiology: Abscess Wound Location: Right Amputation Site - Below Knee Wound Status: Open Wounding Event: Gradually Appeared Comorbid History: Hypertension, Type II Diabetes Date Acquired: 01/14/2022 Weeks Of Treatment: 10 Clustered Wound: No Photos  Wound Measurements Length: (cm) 4 Width: (cm) 5 Depth: (cm) 0.2 Area: (cm) 15.708 Volume: (cm) 3.142 % Reduction in Area: 41.9% % Reduction in Volume: 97% Epithelialization: Small (1-33%) Tunneling: Yes Position (o'clock): 11 Maximum Distance: (cm) 1 Wound Description Classification: Full Thickness With Exposed Support Structures Wound Margin: Flat and Intact Exudate Amount: Medium Exudate Type: Serosanguineous Exudate Color: red, brown Foul Odor After Cleansing: No Slough/Fibrino No Wound Bed Granulation Amount: Large  (67-100%) Exposed Structure Granulation Quality: Red Fascia Exposed: No Necrotic Amount: None Present (0%) Fat Layer (Subcutaneous Tissue) Exposed: Yes Tendon Exposed: No Muscle Exposed: No Joint Exposed: No Bone Exposed: No Treatment Notes Wound #1 (Amputation Site - Below Knee) Wound Laterality: Right Cleanser Peri-Wound Care Muhlenkamp, Alfred ([id number]) Topical Primary Dressing Prisma 4.34 (in) Discharge Instruction: Moisten w/normal saline or sterile water; Cover wound as directed. Do not remove from wound bed. Secondary Dressing (SILCONE BORDER) Zetuvit Plus SILICONE BORDER Dressing 5x5 (in/in) Discharge Instruction: Please do not put silicone bordered dressings under wraps. Use non-bordered dressing only. Secured With Compression Wrap Compression Stockings Facilities manager) Signed: 04/11/2022 3:46:53 PM By: Elliot Gurney, BSN, RN, CWS, [name] RN, BSN Entered By: Elliot Gurney, BSN, RN, CWS, [name] on 04/10/2022 08:53:01 [name] ([id number]) -------------------------------------------------------------------------------- Vitals Details Patient Name: [name] Date of Service: 04/10/2022 8:15 AM Medical Record Number: [id number] Patient Account Number: [id number] Date of Birth/Sex: 1986-05-08 (35 y.o. F) Treating RN: Huel Coventry Primary Care Oval Cavazos: Lynnell Chad Other Clinician: Referring Riyan Gavina: Lynnell Chad Treating Carmin Alvidrez/Extender: Rowan Blase in Treatment: 10 Vital Signs Time Taken: 08:18 Temperature (F): 98.3 Height (in): 67 Pulse (bpm): 103 Weight (lbs): 219 Respiratory Rate (breaths/min): 16 Body Mass Index (BMI): 34.3 Blood Pressure (mmHg): 133/88 Reference Range: 80 - 120 mg / dl Electronic Signature(s) Signed: 04/11/2022 3:46:53 PM By: Elliot Gurney, BSN, RN, CWS, [name] RN, BSN Entered By: Elliot Gurney, BSN, RN, CWS, [name] on 04/10/2022 [id number]

## 2022-04-13 ENCOUNTER — Ambulatory Visit: Payer: Medicaid Other

## 2022-04-17 ENCOUNTER — Encounter: Payer: Medicaid Other | Admitting: Physician Assistant

## 2022-04-17 ENCOUNTER — Other Ambulatory Visit: Payer: Self-pay | Admitting: Nurse Practitioner

## 2022-04-17 DIAGNOSIS — E11622 Type 2 diabetes mellitus with other skin ulcer: Secondary | ICD-10-CM | POA: Diagnosis not present

## 2022-04-17 DIAGNOSIS — E1165 Type 2 diabetes mellitus with hyperglycemia: Secondary | ICD-10-CM

## 2022-04-18 NOTE — Progress Notes (Signed)
DAMALI, BROADFOOT ([id number]) Visit Report for 04/17/2022 Arrival Information Details Patient Name: [name], [name] Date of Service: 04/17/2022 8:00 AM Medical Record Number: [id number] Patient Account Number: [id number] Date of Birth/Sex: Apr 29, 1986 (36 y.o. F) Treating RN: Yevonne Pax Primary Care Donasia Wimes: Lynnell Chad Other Clinician: Referring Charna Neeb: Lynnell Chad Treating Kijuana Ruppel/Extender: Rowan Blase in Treatment: 11 Visit Information History Since Last Visit All ordered tests and consults were completed: No Patient Arrived: Wheel Chair Added or deleted any medications: No Arrival Time: 08:04 Any new allergies or adverse reactions: No Accompanied By: self Had a fall or experienced change in No Transfer Assistance: None activities of daily living that may affect Patient Identification Verified: Yes risk of falls: Secondary Verification Process Completed: Yes Signs or symptoms of abuse/neglect since last visito No Patient Requires Transmission-Based Precautions: No Hospitalized since last visit: No Patient Has Alerts: Yes Implantable device outside of the clinic excluding No cellular tissue based products placed in the center since last visit: Has Dressing in Place as Prescribed: Yes Pain Present Now: No Electronic Signature(s) Signed: 04/18/2022 8:33:55 AM By: Yevonne Pax RN Entered By: Yevonne Pax on 04/17/2022 08:08:36 [name] ([id number]) -------------------------------------------------------------------------------- Clinic Level of Care Assessment Details Patient Name: [name] Date of Service: 04/17/2022 8:00 AM Medical Record Number: [id number] Patient Account Number: [id number] Date of Birth/Sex: [birth year] (35 y.o. F) Treating RN: Yevonne Pax Primary Care Cherron Blitzer: Lynnell Chad Other Clinician: Referring Nicosha Struve: Lynnell Chad Treating Zyaire Dumas/Extender: Rowan Blase in Treatment: 11 Clinic Level of Care Assessment  Items TOOL 4 Quantity Score X - Use when only an EandM is performed on FOLLOW-UP visit 1 0 ASSESSMENTS - Nursing Assessment / Reassessment X - Reassessment of Co-morbidities (includes updates in patient status) 1 10 X- 1 5 Reassessment of Adherence to Treatment Plan ASSESSMENTS - Wound and Skin Assessment / Reassessment X - Simple Wound Assessment / Reassessment - one wound 1 5 []  - 0 Complex Wound Assessment / Reassessment - multiple wounds []  - 0 Dermatologic / Skin Assessment (not related to wound area) ASSESSMENTS - Focused Assessment []  - Circumferential Edema Measurements - multi extremities 0 []  - 0 Nutritional Assessment / Counseling / Intervention []  - 0 Lower Extremity Assessment (monofilament, tuning fork, pulses) []  - 0 Peripheral Arterial Disease Assessment (using hand held doppler) ASSESSMENTS - Ostomy and/or Continence Assessment and Care []  - Incontinence Assessment and Management 0 []  - 0 Ostomy Care Assessment and Management (repouching, etc.) PROCESS - Coordination of Care X - Simple Patient / Family Education for ongoing care 1 15 []  - 0 Complex (extensive) Patient / Family Education for ongoing care []  - 0 Staff obtains , Records, Test Results / Process Orders []  - 0 Staff telephones HHA, Nursing Homes / Clarify orders / etc []  - 0 Routine Transfer to another Facility (non-emergent condition) []  - 0 Routine Hospital Admission (non-emergent condition) []  - 0 New Admissions / / Ordering NPWT, Apligraf, etc. []  - 0 Emergency Hospital Admission (emergent condition) X- 1 10 Simple Discharge Coordination []  - 0 Complex (extensive) Discharge Coordination PROCESS - Special Needs []  - Pediatric / Minor Patient Management 0 []  - 0 Isolation Patient Management []  - 0 Hearing / Language / Visual special needs []  - 0 Assessment of Community assistance (transportation, D/C planning, etc.) []  - 0 Additional assistance /  Altered mentation []  - 0 Support Surface(s) Assessment (bed, cushion, seat, etc.) INTERVENTIONS - Wound Cleansing / Measurement Kabir, Genita ( ) X- 1 5 Simple Wound Cleansing - one wound []  -  0 Complex Wound Cleansing - multiple wounds X- 1 5 Wound Imaging (photographs - any number of wounds) []  - 0 Wound Tracing (instead of photographs) X- 1 5 Simple Wound Measurement - one wound []  - 0 Complex Wound Measurement - multiple wounds INTERVENTIONS - Wound Dressings X - Small Wound Dressing one or multiple wounds 1 10 []  - 0 Medium Wound Dressing one or multiple wounds []  - 0 Large Wound Dressing one or multiple wounds []  - 0 Application of Medications - topical []  - 0 Application of Medications - injection INTERVENTIONS - Miscellaneous []  - External ear exam 0 []  - 0 Specimen Collection (cultures, biopsies, blood, body fluids, etc.) []  - 0 Specimen(s) / Culture(s) sent or taken to Lab for analysis []  - 0 Patient Transfer (multiple staff / / Similar devices) []  - 0 Simple Staple / Suture removal (25 or less) []  - 0 Complex Staple / Suture removal (26 or more) []  - 0 Hypo / Hyperglycemic Management (close monitor of Blood Glucose) []  - 0 Ankle / Brachial Index (ABI) - do not check if billed separately X- 1 5 Vital Signs Has the patient been seen at the hospital within the last three years: Yes Total Score: 75 Level Of Care: New/Established - Level 2 Electronic Signature(s) Signed: 04/18/2022 8:33:55 AM By: RN Entered By: on 04/17/2022 08:39:55 ( ) -------------------------------------------------------------------------------- Encounter Discharge Information Details Patient Name: Date of Service: 04/17/2022 8:00 AM Medical Record Number: Patient Account Number: Nurse, adult Date of Birth/Sex: [birth year] (35 y.o. F) Treating RN: Primary Care Jammie Clink: Other Clinician: Referring Kaira Stringfield: 04/20/2022 Treating Eesha Schmaltz/Extender: Yevonne Pax in Treatment: 11 Encounter Discharge Information Items Discharge Condition: Stable Ambulatory Status: Wheelchair Discharge Destination: Home Transportation: Private Auto Accompanied By: self Schedule Follow-up Appointment: Yes Clinical Summary of Care: Electronic Signature(s) Signed: 04/18/2022 8:33:55 AM By: 04/19/2022 RN Entered By: [name] on 04/17/2022 08:41:23 [name] (04/19/2022) -------------------------------------------------------------------------------- Lower Extremity Assessment Details Patient Name: [id number] Date of Service: 04/17/2022 8:00 AM Medical Record Number: 09/17/1986 Patient Account Number: [id number] Date of Birth/Sex: 04/27/1986 (35 y.o. F) Treating RN: Lynnell Chad Primary Care Chrisopher Pustejovsky: Rowan Blase Other Clinician: Referring Monica Zahler: 04/20/2022 Treating Keshawn Fiorito/Extender: Yevonne Pax in Treatment: 11 Electronic Signature(s) Signed: 04/18/2022 8:33:55 AM By: 04/19/2022 RN Entered By: [name] on 04/17/2022 08:14:47 [name] (04/19/2022) -------------------------------------------------------------------------------- Multi Wound Chart Details Patient Name: [id number] Date of Service: 04/17/2022 8:00 AM Medical Record Number: 09/17/1986 Patient Account Number: [id number] Date of Birth/Sex: 1986/03/02 (35 y.o. F) Treating RN: Lynnell Chad Primary Care Hernan Turnage: Rowan Blase Other Clinician: Referring Raymie Trani: 04/20/2022 Treating Ezequias Lard/Extender: Yevonne Pax in Treatment: 11 Vital Signs Height(in): 67 Pulse(bpm): 105 Weight(lbs): 219 Blood Pressure(mmHg): 117/75 Body Mass Index(BMI): 34.3 Temperature(F): 98.3 Respiratory Rate(breaths/min): 16 Photos: [N/A:N/A] Wound Location: Right Amputation Site - Below Knee N/A N/A Wounding Event: Gradually Appeared N/A N/A Primary Etiology:  Abscess N/A N/A Comorbid History: Hypertension, Type II Diabetes N/A N/A Date Acquired: 01/14/2022 N/A N/A Weeks of Treatment: 11 N/A N/A Wound Status: Open N/A N/A Wound Recurrence: No N/A N/A Measurements L x W x D (cm) 3.2x4.7x0.2 N/A N/A Area (cm) : 11.812 N/A N/A Volume (cm) : 2.362 N/A N/A % Reduction in Area: 56.30% N/A N/A % Reduction in Volume: 97.80% N/A N/A Classification: Full Thickness With Exposed N/A N/A Support Structures Exudate Amount: Medium N/A N/A Exudate Type: Serosanguineous N/A N/A Exudate Color: red, brown N/A N/A  Wound Margin: Flat and Intact N/A N/A Granulation Amount: Large (67-100%) N/A N/A Granulation Quality: Red N/A N/A Necrotic Amount: None Present (0%) N/A N/A Exposed Structures: Fat Layer (Subcutaneous Tissue): N/A N/A Yes Fascia: No Tendon: No Muscle: No Joint: No Bone: No Epithelialization: Small (1-33%) N/A N/A Treatment Notes Electronic Signature(s) Signed: 04/18/2022 8:33:55 AM By: Yevonne Pax RN Entered By: Yevonne Pax on 04/17/2022 08:14:59 [name] ([id number]) -------------------------------------------------------------------------------- Multi-Disciplinary Care Plan Details Patient Name: [name] Date of Service: 04/17/2022 8:00 AM Medical Record Number: [id number] Patient Account Number: [id number] Date of Birth/Sex: 1986/06/02 (35 y.o. F) Treating RN: Yevonne Pax Primary Care Shafiq Larch: Lynnell Chad Other Clinician: Referring Keyondre Hepburn: Lynnell Chad Treating Aynsley Fleet/Extender: Rowan Blase in Treatment: 11 Active Inactive Wound/Skin Impairment Nursing Diagnoses: Knowledge deficit related to ulceration/compromised skin integrity Goals: Patient/caregiver will verbalize understanding of skin care regimen Date Initiated: 01/27/2022 Target Resolution Date: 02/26/2022 Goal Status: Active Ulcer/skin breakdown will have a volume reduction of 30% by week 4 Date Initiated: 01/27/2022 Target Resolution Date:  03/29/2022 Goal Status: Active Ulcer/skin breakdown will have a volume reduction of 50% by week 8 Date Initiated: 01/27/2022 Target Resolution Date: 04/28/2022 Goal Status: Active Ulcer/skin breakdown will have a volume reduction of 80% by week 12 Date Initiated: 01/27/2022 Target Resolution Date: 05/29/2022 Goal Status: Active Ulcer/skin breakdown will heal within 14 weeks Date Initiated: 01/27/2022 Target Resolution Date: 06/29/2022 Goal Status: Active Interventions: Assess patient/caregiver ability to obtain necessary supplies Assess patient/caregiver ability to perform ulcer/skin care regimen upon admission and as needed Assess ulceration(s) every visit Notes: Electronic Signature(s) Signed: 04/18/2022 8:33:55 AM By: Yevonne Pax RN Entered By: Yevonne Pax on 04/17/2022 08:14:51 [name] ([id number]) -------------------------------------------------------------------------------- Pain Assessment Details Patient Name: [name] Date of Service: 04/17/2022 8:00 AM Medical Record Number: [id number] Patient Account Number: [id number] Date of Birth/Sex: [birth year] (36 y.o. F) Treating RN: Yevonne Pax Primary Care Annalese Stiner: Lynnell Chad Other Clinician: Referring Catia Todorov: Lynnell Chad Treating Johndavid Geralds/Extender: Rowan Blase in Treatment: 11 Active Problems Location of Pain Severity and Description of Pain Patient Has Paino No Site Locations Pain Management and Medication Current Pain Management: Electronic Signature(s) Signed: 04/18/2022 8:33:55 AM By: Yevonne Pax RN Entered By: Yevonne Pax on 04/17/2022 08:09:14 [name] ([id number]) -------------------------------------------------------------------------------- Patient/Caregiver Education Details Patient Name: [name] Date of Service: 04/17/2022 8:00 AM Medical Record Number: [id number] Patient Account Number: [id number] Date of Birth/Gender: 02-27-1986 (35 y.o. F) Treating RN: Yevonne Pax Primary Care Physician: Lynnell Chad Other Clinician: Referring Physician: Lynnell Chad Treating Physician/Extender: Rowan Blase in Treatment: 11 Education Assessment Education Provided To: Patient Education Topics Provided Wound/Skin Impairment: Methods: Explain/Verbal Responses: State content correctly Electronic Signature(s) Signed: 04/18/2022 8:33:55 AM By: Yevonne Pax RN Entered By: Yevonne Pax on 04/17/2022 08:40:31 [name] ([id number]) -------------------------------------------------------------------------------- Wound Assessment Details Patient Name: [name] Date of Service: 04/17/2022 8:00 AM Medical Record Number: [id number] Patient Account Number: [id number] Date of Birth/Sex: 1986-04-20 (35 y.o. F) Treating RN: Yevonne Pax Primary Care Lynora Dymond: Lynnell Chad Other Clinician: Referring Alliene Klugh: Lynnell Chad Treating Krzysztof Reichelt/Extender: Rowan Blase in Treatment: 11 Wound Status Wound Number: 1 Primary Etiology: Abscess Wound Location: Right Amputation Site - Below Knee Wound Status: Open Wounding Event: Gradually Appeared Comorbid History: Hypertension, Type II Diabetes Date Acquired: 01/14/2022 Weeks Of Treatment: 11 Clustered Wound: No Photos Wound Measurements Length: (cm) 3.2 Width: (cm) 4.7 Depth: (cm) 0.2 Area: (cm) 11.812 Volume: (cm) 2.362 % Reduction in Area: 56.3% % Reduction in Volume: 97.8% Epithelialization: Small (1-33%) Tunneling: No Undermining: No Wound Description Classification:  Full Thickness With Exposed Support Structures Wound Margin: Flat and Intact Exudate Amount: Medium Exudate Type: Serosanguineous Exudate Color: red, brown Foul Odor After Cleansing: No Slough/Fibrino No Wound Bed Granulation Amount: Large (67-100%) Exposed Structure Granulation Quality: Red Fascia Exposed: No Necrotic Amount: None Present (0%) Fat Layer (Subcutaneous Tissue) Exposed: Yes Tendon Exposed:  No Muscle Exposed: No Joint Exposed: No Bone Exposed: No Treatment Notes Wound #1 (Amputation Site - Below Knee) Wound Laterality: Right Cleanser Peri-Wound Care Topical Galen, Pegah ([id number]) Primary Dressing Prisma 4.34 (in) Discharge Instruction: Moisten w/normal saline or sterile water; Cover wound as directed. Do not remove from wound bed. Secondary Dressing ABD Pad 5x9 (in/in) Discharge Instruction: Cover with ABD pad Secured With Medipore Tape - 26M Medipore H Soft Cloth Surgical Tape, 2x2 (in/yd) Compression Wrap Compression Stockings Add-Ons Electronic Signature(s) Signed: 04/18/2022 8:33:55 AM By: Yevonne Pax RN Entered By: Yevonne Pax on 04/17/2022 08:14:33 [name] ([id number]) -------------------------------------------------------------------------------- Vitals Details Patient Name: [name] Date of Service: 04/17/2022 8:00 AM Medical Record Number: [id number] Patient Account Number: [id number] Date of Birth/Sex: Sep 08, 1986 (35 y.o. F) Treating RN: Yevonne Pax Primary Care Shalon Salado: Lynnell Chad Other Clinician: Referring Kamil Mchaffie: Lynnell Chad Treating Prajwal Fellner/Extender: Rowan Blase in Treatment: 11 Vital Signs Time Taken: 08:08 Temperature (F): 98.3 Height (in): 67 Pulse (bpm): 105 Weight (lbs): 219 Respiratory Rate (breaths/min): 16 Body Mass Index (BMI): 34.3 Blood Pressure (mmHg): 117/75 Reference Range: 80 - 120 mg / dl Electronic Signature(s) Signed: 04/18/2022 8:33:55 AM By: Yevonne Pax RN Entered By: Yevonne Pax on 04/17/2022 08:09:00

## 2022-04-18 NOTE — Progress Notes (Signed)
[name], [name] ([id number]) Visit Report for 04/17/2022 Chief Complaint Document Details Patient Name: [name], [name] Date of Service: 04/17/2022 8:00 AM Medical Record Number: [id number] Patient Account Number: [id number] Date of Birth/Sex: 08/01/1986 (36 y.o. F) Treating RN: Yevonne PaxEpps, Carrie Primary Care Provider: Lynnell ChadIjaola, Onyeje Other Clinician: Referring Provider: Lynnell ChadIjaola, Onyeje Treating Provider/Extender: Rowan BlaseStone, Kaniesha Barile Weeks in Treatment: 11 Information Obtained from: Patient Chief Complaint Right BKA Amputation Site Disruption Electronic Signature(s) Signed: 04/18/2022 5:53:34 PM By: Lenda KelpStone III, Myrtle Barnhard PA-C Entered By: Lenda KelpStone III, Cesilia Shinn on 04/17/2022 08:22:22 [name], [name] ([id number]) -------------------------------------------------------------------------------- HPI Details Patient Name: [name], [name] Date of Service: 04/17/2022 8:00 AM Medical Record Number: [id number] Patient Account Number: [id number] Date of Birth/Sex: 08/01/1986 (36 y.o. F) Treating RN: Yevonne PaxEpps, Carrie Primary Care Provider: Lynnell ChadIjaola, Onyeje Other Clinician: Referring Provider: Lynnell ChadIjaola, Onyeje Treating Provider/Extender: Rowan BlaseStone, Jeannifer Drakeford Weeks in Treatment: 11 History of Present Illness HPI Description: 01-27-2022 upon evaluation today patient appears to be doing somewhat poorly in regard to her right below-knee amputation. This unfortunately developed a abscess internally following the initial amputation which was January 2023. Subsequently she was getting very close to starting to work on the prosthesis when she had an area that somewhat bulged out ended up being an abscess and the incision and drainage was performed April 22. Subsequently she did have antibiotic beads placed in a wound VAC after this was cleared out. Her most recent reported hemoglobin A1c was 6.4. With that being said the patient tells me that her blood sugars seem to be under pretty good control as best she can tell at this point. She also tells me that the  wound VAC seems to be functioning appropriately although upon evaluation the did not appear to be anything tucked into the region of undermining and depth in the roughly 9-12 o'clock location based on what we saw today. This is also where the antibiotic beads are and I can see those in place. I do believe that she is getting need to have the wound VAC tucked down into this location. Patient does have a history of diabetes mellitus type 2, diabetic neuropathy, and hypertension. This initial amputation occurred as a result of her having sewing needles that actually went into her heel she did not know about it started having trouble with her heel and ended up with a significant infection which was stated to be necrotizing and she subsequently had to have emergency amputation. 02-13-2022 upon evaluation today patient appears to be doing well with regard to her wound. She has been tolerating the dressing changes without complication. The wound VAC does seem to be doing a great job. Fortunately I do not see any evidence of active infection locally or systemically at this time which is great news. She still has some of the antibiotic beads in the base of the wound. 02-20-2022 upon evaluation today patient appears to be doing well currently. There does not appear to be any evidence of active infection locally nor systemically which is great news and overall I am extremely pleased with where we stand today. I do believe that the wound VAC is doing a good job here and the color of the drainage is improved again were unsure as to whether this may have been the antibiotic beads or if there was something such as Pseudomonas causing the greenish discoloration to the drainage either way she is doing significantly better. 03-06-2022 upon evaluation today patient appears to be doing well with regard to her wound. The only issue I see is that she does have  a little bit of a pocket of fluid that collected in the deeper  section everything else is actually measuring smaller and looks better. This was a point where I really did expect this was been a turnaround and start doing a lot better. I think we are especially seeing that right now. 03-20-2022 upon evaluation today patient appears to be doing well with regard to her wound in fact this is actually significantly improved compared to last time I saw her. Fortunately I do not see any evidence of active infection at this time. No fevers, chills, nausea, vomiting, or diarrhea. 03-27-2022 upon evaluation today patient appears to be doing excellent in regard to her wound. This is showing signs of improvement which is great news and overall I am extremely pleased with where we stand currently. I do not see any evidence of active infection locally or systemically which is great news. No fevers, chills, nausea, vomiting, or diarrhea. 04-03-2022 upon evaluation today patient's wound is showing signs of significant improvement. I am still very pleased with where we stand and how things are going. I think that she is really making excellent progress which is great news. I do not see any signs of active infection at this time. 04-10-2022 upon evaluation today patient's wound actually showed signs of good granulation and epithelization at this point. Fortunately I do not see any evidence of active infection at this time which is great news and overall I am extremely pleased with where things stand currently. No fevers, chills, nausea, vomiting, or diarrhea. 04-17-2022 upon evaluation today patient appears to be doing well with regard to her wound she is actually showing signs of significant improvement which is great news. I do believe that she is making good progress with the current wound care measures. Fortunately I do not see any evidence of active infection locally or systemically which is great news. Electronic Signature(s) Signed: 04/17/2022 9:17:04 AM By: Lenda Kelp  PA-C Entered By: Lenda Kelp on 04/17/2022 09:17:04 [name] ([id number]) -------------------------------------------------------------------------------- Physical Exam Details Patient Name: [name] Date of Service: 04/17/2022 8:00 AM Medical Record Number: [id number] Patient Account Number: [id number] Date of Birth/Sex: Aug 22, 1986 (36 y.o. F) Treating RN: Yevonne Pax Primary Care Provider: Lynnell Chad Other Clinician: Referring Provider: Lynnell Chad Treating Provider/Extender: Rowan Blase in Treatment: 11 Constitutional Well-nourished and well-hydrated in no acute distress. Respiratory normal breathing without difficulty. Psychiatric this patient is able to make decisions and demonstrates good insight into disease process. Alert and Oriented x 3. pleasant and cooperative. Notes Upon inspection patient's wound bed actually showed signs of good granulation and epithelization at this point. Fortunately I do not see anything worsening and her swelling is significantly down with the Tubigrip I am very pleased in that regard as well. Overall I think we are on the right track here. Electronic Signature(s) Signed: 04/17/2022 9:17:31 AM By: Lenda Kelp PA-C Entered By: Lenda Kelp on 04/17/2022 09:17:30 [name] ([id number]) -------------------------------------------------------------------------------- Physician Orders Details Patient Name: [name] Date of Service: 04/17/2022 8:00 AM Medical Record Number: [id number] Patient Account Number: [id number] Date of Birth/Sex: 01-21-1986 (36 y.o. F) Treating RN: Yevonne Pax Primary Care Provider: Lynnell Chad Other Clinician: Referring Provider: Lynnell Chad Treating Provider/Extender: Rowan Blase in Treatment: 11 Verbal / Phone Orders: No Diagnosis Coding ICD-10 Coding Code Description E11.622 Type 2 diabetes mellitus with other skin ulcer T81.31XA Disruption of external operation  (surgical) wound, not elsewhere classified, initial encounter L97.812 Non-pressure chronic ulcer of other part  of right lower leg with fat layer exposed E11.43 Type 2 diabetes mellitus with diabetic autonomic (poly)neuropathy I10 Essential (primary) hypertension Follow-up Appointments o Return Appointment in 1 week. Bathing/ Shower/ Hygiene o May shower; gently cleanse wound with antibacterial soap, rinse and pat dry prior to dressing wounds Negative Pressure Wound Therapy o Discontinue NPWT. Wound Treatment Wound #1 - Amputation Site - Below Knee Wound Laterality: Right Primary Dressing: Prisma 4.34 (in) (Generic) 3 x Per Week/30 Days Discharge Instructions: Moisten w/normal saline or sterile water; Cover wound as directed. Do not remove from wound bed. Secondary Dressing: ABD Pad 5x9 (in/in) (Generic) 3 x Per Week/30 Days Discharge Instructions: Cover with ABD pad Secured With: Medipore Tape - 57M Medipore H Soft Cloth Surgical Tape, 2x2 (in/yd) 3 x Per Week/30 Days Electronic Signature(s) Signed: 04/18/2022 8:33:55 AM By: Yevonne Pax RN Signed: 04/18/2022 5:53:34 PM By: Lenda Kelp PA-C Entered By: Yevonne Pax on 04/17/2022 08:40:12 [name] ([id number]) -------------------------------------------------------------------------------- Problem List Details Patient Name: [name] Date of Service: 04/17/2022 8:00 AM Medical Record Number: [id number] Patient Account Number: [id number] Date of Birth/Sex: Dec 08, 1985 (36 y.o. F) Treating RN: Yevonne Pax Primary Care Provider: Lynnell Chad Other Clinician: Referring Provider: Lynnell Chad Treating Provider/Extender: Rowan Blase in Treatment: 11 Active Problems ICD-10 Encounter Code Description Active Date MDM Diagnosis E11.622 Type 2 diabetes mellitus with other skin ulcer 01/27/2022 No Yes T81.31XA Disruption of external operation (surgical) wound, not elsewhere 01/27/2022 No Yes classified, initial  encounter L97.812 Non-pressure chronic ulcer of other part of right lower leg with fat layer 01/27/2022 No Yes exposed E11.43 Type 2 diabetes mellitus with diabetic autonomic (poly)neuropathy 01/27/2022 No Yes I10 Essential (primary) hypertension 01/27/2022 No Yes Inactive Problems Resolved Problems Electronic Signature(s) Signed: 04/18/2022 5:53:34 PM By: Lenda Kelp PA-C Entered By: Lenda Kelp on 04/17/2022 08:22:14 [name] ([id number]) -------------------------------------------------------------------------------- Progress Note Details Patient Name: [name] Date of Service: 04/17/2022 8:00 AM Medical Record Number: [id number] Patient Account Number: [id number] Date of Birth/Sex: 1986/05/28 (36 y.o. F) Treating RN: Yevonne Pax Primary Care Provider: Lynnell Chad Other Clinician: Referring Provider: Lynnell Chad Treating Provider/Extender: Rowan Blase in Treatment: 11 Subjective Chief Complaint Information obtained from Patient Right BKA Amputation Site Disruption History of Present Illness (HPI) 01-27-2022 upon evaluation today patient appears to be doing somewhat poorly in regard to her right below-knee amputation. This unfortunately developed a abscess internally following the initial amputation which was January 2023. Subsequently she was getting very close to starting to work on the prosthesis when she had an area that somewhat bulged out ended up being an abscess and the incision and drainage was performed April 22. Subsequently she did have antibiotic beads placed in a wound VAC after this was cleared out. Her most recent reported hemoglobin A1c was 6.4. With that being said the patient tells me that her blood sugars seem to be under pretty good control as best she can tell at this point. She also tells me that the wound VAC seems to be functioning appropriately although upon evaluation the did not appear to be anything tucked into the region of  undermining and depth in the roughly 9-12 o'clock location based on what we saw today. This is also where the antibiotic beads are and I can see those in place. I do believe that she is getting need to have the wound VAC tucked down into this location. Patient does have a history of diabetes mellitus type 2, diabetic neuropathy, and hypertension. This initial amputation occurred  as a result of her having sewing needles that actually went into her heel she did not know about it started having trouble with her heel and ended up with a significant infection which was stated to be necrotizing and she subsequently had to have emergency amputation. 02-13-2022 upon evaluation today patient appears to be doing well with regard to her wound. She has been tolerating the dressing changes without complication. The wound VAC does seem to be doing a great job. Fortunately I do not see any evidence of active infection locally or systemically at this time which is great news. She still has some of the antibiotic beads in the base of the wound. 02-20-2022 upon evaluation today patient appears to be doing well currently. There does not appear to be any evidence of active infection locally nor systemically which is great news and overall I am extremely pleased with where we stand today. I do believe that the wound VAC is doing a good job here and the color of the drainage is improved again were unsure as to whether this may have been the antibiotic beads or if there was something such as Pseudomonas causing the greenish discoloration to the drainage either way she is doing significantly better. 03-06-2022 upon evaluation today patient appears to be doing well with regard to her wound. The only issue I see is that she does have a little bit of a pocket of fluid that collected in the deeper section everything else is actually measuring smaller and looks better. This was a point where I really did expect this was been a  turnaround and start doing a lot better. I think we are especially seeing that right now. 03-20-2022 upon evaluation today patient appears to be doing well with regard to her wound in fact this is actually significantly improved compared to last time I saw her. Fortunately I do not see any evidence of active infection at this time. No fevers, chills, nausea, vomiting, or diarrhea. 03-27-2022 upon evaluation today patient appears to be doing excellent in regard to her wound. This is showing signs of improvement which is great news and overall I am extremely pleased with where we stand currently. I do not see any evidence of active infection locally or systemically which is great news. No fevers, chills, nausea, vomiting, or diarrhea. 04-03-2022 upon evaluation today patient's wound is showing signs of significant improvement. I am still very pleased with where we stand and how things are going. I think that she is really making excellent progress which is great news. I do not see any signs of active infection at this time. 04-10-2022 upon evaluation today patient's wound actually showed signs of good granulation and epithelization at this point. Fortunately I do not see any evidence of active infection at this time which is great news and overall I am extremely pleased with where things stand currently. No fevers, chills, nausea, vomiting, or diarrhea. 04-17-2022 upon evaluation today patient appears to be doing well with regard to her wound she is actually showing signs of significant improvement which is great news. I do believe that she is making good progress with the current wound care measures. Fortunately I do not see any evidence of active infection locally or systemically which is great news. Objective Constitutional Well-nourished and well-hydrated in no acute distress. DENNETTE, FAULCONER ([id number]) Vitals Time Taken: 8:08 AM, Height: 67 in, Weight: 219 lbs, BMI: 34.3, Temperature: 98.3 F,  Pulse: 105 bpm, Respiratory Rate: 16 breaths/min, Blood Pressure: 117/75 mmHg.  Respiratory normal breathing without difficulty. Psychiatric this patient is able to make decisions and demonstrates good insight into disease process. Alert and Oriented x 3. pleasant and cooperative. General Notes: Upon inspection patient's wound bed actually showed signs of good granulation and epithelization at this point. Fortunately I do not see anything worsening and her swelling is significantly down with the Tubigrip I am very pleased in that regard as well. Overall I think we are on the right track here. Integumentary (Hair, Skin) Wound #1 status is Open. Original cause of wound was Gradually Appeared. The date acquired was: 01/14/2022. The wound has been in treatment 11 weeks. The wound is located on the Right Amputation Site - Below Knee. The wound measures 3.2cm length x 4.7cm width x 0.2cm depth; 11.812cm^2 area and 2.362cm^3 volume. There is Fat Layer (Subcutaneous Tissue) exposed. There is no tunneling or undermining noted. There is a medium amount of serosanguineous drainage noted. The wound margin is flat and intact. There is large (67-100%) red granulation within the wound bed. There is no necrotic tissue within the wound bed. 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 Bathing/ Shower/ Hygiene: May shower; gently cleanse wound with antibacterial soap, rinse and pat dry prior to dressing wounds Negative Pressure Wound Therapy: Discontinue NPWT. WOUND #1: - Amputation Site - Below Knee Wound Laterality: Right Primary  Dressing: Prisma 4.34 (in) (Generic) 3 x Per Week/30 Days Discharge Instructions: Moisten w/normal saline or sterile water; Cover wound as directed. Do not remove from wound bed. Secondary Dressing: ABD Pad 5x9 (in/in) (Generic) 3 x Per Week/30 Days Discharge Instructions: Cover with ABD pad Secured With: Medipore Tape - 39M Medipore H Soft Cloth Surgical Tape, 2x2 (in/yd) 3 x Per Week/30 Days 1. No sharp debridement was necessary today and patient's wound is actually measuring significantly smaller which is great news overall I am extremely pleased with where we stand currently. 2. I am also can recommend that we have the patient continue with an ABD pad to cover after applying the silver collagen which I think is going to be an appropriate thing to continue with. She is using a roll gauze to secure in place and then Tubigrip to help with edema control which has really helped significantly. We will see patient back for reevaluation in 1 week here in the clinic. If anything worsens or changes patient will contact our office for additional recommendations. Electronic Signature(s) Signed: 04/17/2022 9:18:07 AM By: Lenda Kelp PA-C Entered By: Lenda Kelp on 04/17/2022 09:18:07 [name] ([id number]) -------------------------------------------------------------------------------- SuperBill Details Patient Name: [name] Date of Service: 04/17/2022 Medical Record Number: [id number] Patient Account Number: [id number] Date of Birth/Sex: 06-11-1986 (36 y.o. F) Treating RN: Yevonne Pax Primary Care Provider: Lynnell Chad Other Clinician: Referring Provider: Lynnell Chad Treating Provider/Extender: Rowan Blase in Treatment: 11 Diagnosis Coding ICD-10 Codes Code Description E11.622 Type 2 diabetes mellitus with other skin ulcer T81.31XA Disruption of external operation (surgical) wound, not elsewhere classified, initial encounter L97.812 Non-pressure chronic ulcer of  other part of right lower leg with fat layer exposed E11.43 Type 2 diabetes mellitus with diabetic autonomic (poly)neuropathy I10 Essential (primary) hypertension Facility Procedures CPT4 Code: [id number] Description: [id number] - WOUND CARE VISIT-LEV 2 EST  PT Modifier: Quantity: 1 Physician Procedures CPT4 Code: [id number] Description: 99213 - WC PHYS LEVEL 3 - EST PT Modifier: Quantity: 1 CPT4 Code: Description: ICD-10 Diagnosis Description E11.622 Type 2 diabetes mellitus with other skin ulcer T81.31XA Disruption of external operation (surgical) wound, not elsewhere classifi L97.812 Non-pressure chronic ulcer of other part of right lower leg with  fat laye E11.43 Type 2 diabetes mellitus with diabetic autonomic (poly)neuropathy Modifier: ed, initial encounter r exposed Quantity: Electronic Signature(s) Signed: 04/17/2022 9:18:36 AM By: Lenda Kelp PA-C Entered By: Lenda Kelp on 04/17/2022 09:18:35

## 2022-04-20 ENCOUNTER — Ambulatory Visit: Payer: Medicaid Other

## 2022-04-21 ENCOUNTER — Telehealth: Payer: Self-pay | Admitting: Nurse Practitioner

## 2022-04-21 DIAGNOSIS — E1165 Type 2 diabetes mellitus with hyperglycemia: Secondary | ICD-10-CM

## 2022-04-21 MED ORDER — SEMAGLUTIDE (2 MG/DOSE) 8 MG/3ML ~~LOC~~ SOPN: 2.0000 mg | PEN_INJECTOR | SUBCUTANEOUS | 2 refills | Status: DC

## 2022-04-21 NOTE — Telephone Encounter (Signed)
Patient went to pickup Ozempic and was told it was too early. RX says 1 mg and patient is on 4 mg. Please call and fix dosage and call patient when it is done.

## 2022-04-21 NOTE — Addendum Note (Signed)
Addended by: Vanice Sarah D on: 04/21/2022 03:44 PM   Modules accepted: Orders

## 2022-04-21 NOTE — Telephone Encounter (Signed)
Pt LM - aware this dose was sent on Monday - she is to check with [facility] and call back if still having issues getting it

## 2022-04-21 NOTE — Telephone Encounter (Signed)
Pt called to let PCP know that she cant pick up her Ozempic because [facility] is out of stock and says it is on back order everywhere.  Pt needs advise on what to do.

## 2022-04-21 NOTE — Telephone Encounter (Signed)
Increase to Ozempic 2mg  weekly Patient aware Denies personal and family history of Medullary thyroid cancer (MTC) Counseling provided

## 2022-04-21 NOTE — Telephone Encounter (Signed)
[name] has spoken to patient over the phone. Medication reconciled.

## 2022-04-24 ENCOUNTER — Encounter: Payer: Medicaid Other | Admitting: Internal Medicine

## 2022-04-24 DIAGNOSIS — E11622 Type 2 diabetes mellitus with other skin ulcer: Secondary | ICD-10-CM | POA: Diagnosis not present

## 2022-04-25 NOTE — Progress Notes (Signed)
CARON, ODE ([id number]) Visit Report for 04/24/2022 Arrival Information Details Patient Name: [name], [name] Date of Service: 04/24/2022 8:00 AM Medical Record Number: [id number] Patient Account Number: [id number] Date of Birth/Sex: 14-Jan-1986 (36 y.o. F) Treating RN: Carlene Coria Primary Care Ricahrd Schwager: Jac Canavan Other Clinician: Referring Dajsha Massaro: Jac Canavan Treating Micah Barnier/Extender: Tito Dine in Treatment: 12 Visit Information History Since Last Visit All ordered tests and consults were completed: No Patient Arrived: Wheel Chair Added or deleted any medications: No Arrival Time: 08:03 Any new allergies or adverse reactions: No Accompanied By: self Had a fall or experienced change in No Transfer Assistance: None activities of daily living that may affect Patient Identification Verified: Yes risk of falls: Secondary Verification Process Completed: Yes Signs or symptoms of abuse/neglect since last visito No Patient Requires Transmission-Based Precautions: No Hospitalized since last visit: No Patient Has Alerts: Yes Implantable device outside of the clinic excluding No cellular tissue based products placed in the center since last visit: Has Dressing in Place as Prescribed: Yes Pain Present Now: No Electronic Signature(s) Signed: 04/25/2022 3:54:37 PM By: Carlene Coria RN Entered By: Carlene Coria on 04/24/2022 Calhoun, Mckynzie ([id number]) -------------------------------------------------------------------------------- Clinic Level of Care Assessment Details Patient Name: [name] Date of Service: 04/24/2022 8:00 AM Medical Record Number: [id number] Patient Account Number: [id number] Date of Birth/Sex: 24-Apr-1986 (35 y.o. F) Treating RN: Carlene Coria Primary Care Haydin Dunn: Jac Canavan Other Clinician: Referring Uriel Dowding: Jac Canavan Treating Shirin Echeverry/Extender: Tito Dine in Treatment: 12 Clinic Level of Care  Assessment Items TOOL 4 Quantity Score X - Use when only an EandM is performed on FOLLOW-UP visit 1 0 ASSESSMENTS - Nursing Assessment / Reassessment X - Reassessment of Co-morbidities (includes updates in patient status) 1 10 X- 1 5 Reassessment of Adherence to Treatment Plan ASSESSMENTS - Wound and Skin Assessment / Reassessment X - Simple Wound Assessment / Reassessment - one wound 1 5 '[]'  - 0 Complex Wound Assessment / Reassessment - multiple wounds '[]'  - 0 Dermatologic / Skin Assessment (not related to wound area) ASSESSMENTS - Focused Assessment '[]'  - Circumferential Edema Measurements - multi extremities 0 '[]'  - 0 Nutritional Assessment / Counseling / Intervention '[]'  - 0 Lower Extremity Assessment (monofilament, tuning fork, pulses) '[]'  - 0 Peripheral Arterial Disease Assessment (using hand held doppler) ASSESSMENTS - Ostomy and/or Continence Assessment and Care '[]'  - Incontinence Assessment and Management 0 '[]'  - 0 Ostomy Care Assessment and Management (repouching, etc.) PROCESS - Coordination of Care X - Simple Patient / Family Education for ongoing care 1 15 '[]'  - 0 Complex (extensive) Patient / Family Education for ongoing care X- 1 10 Staff obtains Programmer, systems, Records, Test Results / Process Orders '[]'  - 0 Staff telephones HHA, Nursing Homes / Clarify orders / etc '[]'  - 0 Routine Transfer to another Facility (non-emergent condition) '[]'  - 0 Routine Hospital Admission (non-emergent condition) '[]'  - 0 New Admissions / Biomedical engineer / Ordering NPWT, Apligraf, etc. '[]'  - 0 Emergency Hospital Admission (emergent condition) X- 1 10 Simple Discharge Coordination '[]'  - 0 Complex (extensive) Discharge Coordination PROCESS - Special Needs '[]'  - Pediatric / Minor Patient Management 0 '[]'  - 0 Isolation Patient Management '[]'  - 0 Hearing / Language / Visual special needs '[]'  - 0 Assessment of Community assistance (transportation, D/C planning, etc.) '[]'  - 0 Additional  assistance / Altered mentation '[]'  - 0 Support Surface(s) Assessment (bed, cushion, seat, etc.) INTERVENTIONS - Wound Cleansing / Measurement Shimel, Nekia ([id number]) X- 1 5 Simple Wound Cleansing - one wound '[]'  -  0 Complex Wound Cleansing - multiple wounds X- 1 5 Wound Imaging (photographs - any number of wounds) '[]'  - 0 Wound Tracing (instead of photographs) X- 1 5 Simple Wound Measurement - one wound '[]'  - 0 Complex Wound Measurement - multiple wounds INTERVENTIONS - Wound Dressings X - Small Wound Dressing one or multiple wounds 1 10 '[]'  - 0 Medium Wound Dressing one or multiple wounds '[]'  - 0 Large Wound Dressing one or multiple wounds '[]'  - 0 Application of Medications - topical '[]'  - 0 Application of Medications - injection INTERVENTIONS - Miscellaneous '[]'  - External ear exam 0 '[]'  - 0 Specimen Collection (cultures, biopsies, blood, body fluids, etc.) '[]'  - 0 Specimen(s) / Culture(s) sent or taken to Lab for analysis '[]'  - 0 Patient Transfer (multiple staff / Civil Service fast streamer / Similar devices) '[]'  - 0 Simple Staple / Suture removal (25 or less) '[]'  - 0 Complex Staple / Suture removal (26 or more) '[]'  - 0 Hypo / Hyperglycemic Management (close monitor of Blood Glucose) '[]'  - 0 Ankle / Brachial Index (ABI) - do not check if billed separately X- 1 5 Vital Signs Has the patient been seen at the hospital within the last three years: Yes Total Score: 85 Level Of Care: New/Established - Level 3 Electronic Signature(s) Signed: 04/25/2022 3:54:37 PM By: Carlene Coria RN Entered By: Carlene Coria on 04/24/2022 09:02:55 [name] ([id number]) -------------------------------------------------------------------------------- Encounter Discharge Information Details Patient Name: [name] Date of Service: 04/24/2022 8:00 AM Medical Record Number: [id number] Patient Account Number: [id number] Date of Birth/Sex: 06/19/1986 (35 y.o. F) Treating RN: Carlene Coria Primary Care  Harout Scheurich: Jac Canavan Other Clinician: Referring Beyonca Wisz: Jac Canavan Treating Robbin Loughmiller/Extender: Tito Dine in Treatment: 12 Encounter Discharge Information Items Discharge Condition: Stable Ambulatory Status: Wheelchair Discharge Destination: Home Transportation: Private Auto Accompanied By: self Schedule Follow-up Appointment: Yes Clinical Summary of Care: Patient Declined Electronic Signature(s) Signed: 04/25/2022 3:54:37 PM By: Carlene Coria RN Entered By: Carlene Coria on 04/24/2022 09:03:51 [name] ([id number]) -------------------------------------------------------------------------------- Lower Extremity Assessment Details Patient Name: [name] Date of Service: 04/24/2022 8:00 AM Medical Record Number: [id number] Patient Account Number: [id number] Date of Birth/Sex: March 12, 1986 (35 y.o. F) Treating RN: Carlene Coria Primary Care Selicia Windom: Jac Canavan Other Clinician: Referring Michele Judy: Jac Canavan Treating Shatoya Roets/Extender: Tito Dine in Treatment: 12 Electronic Signature(s) Signed: 04/25/2022 3:54:37 PM By: Carlene Coria RN Entered By: Carlene Coria on 04/24/2022 08:11:39 [name] ([id number]) -------------------------------------------------------------------------------- Multi Wound Chart Details Patient Name: [name] Date of Service: 04/24/2022 8:00 AM Medical Record Number: [id number] Patient Account Number: [id number] Date of Birth/Sex: 1986/07/27 (35 y.o. F) Treating RN: Carlene Coria Primary Care Murrell Elizondo: Jac Canavan Other Clinician: Referring Wende Longstreth: Jac Canavan Treating Deshonda Cryderman/Extender: Tito Dine in Treatment: 12 Vital Signs Height(in): 67 Pulse(bpm): 102 Weight(lbs): 219 Blood Pressure(mmHg): 127/86 Body Mass Index(BMI): 34.3 Temperature(F): 98.2 Respiratory Rate(breaths/min): 18 Photos: [N/A:N/A] Wound Location: Right Amputation Site - Below Knee N/A N/A Wounding  Event: Gradually Appeared N/A N/A Primary Etiology: Abscess N/A N/A Comorbid History: Hypertension, Type II Diabetes N/A N/A Date Acquired: 01/14/2022 N/A N/A Weeks of Treatment: 12 N/A N/A Wound Status: Open N/A N/A Wound Recurrence: No N/A N/A Measurements L x W x D (cm) 3x4.6x0.2 N/A N/A Area (cm) : 10.838 N/A N/A Volume (cm) : 2.168 N/A N/A % Reduction in Area: 59.90% N/A N/A % Reduction in Volume: 97.90% N/A N/A Classification: Full Thickness With Exposed N/A N/A Support Structures Exudate Amount: Medium N/A N/A Exudate Type: Serosanguineous N/A N/A Exudate  Color: red, brown N/A N/A Wound Margin: Flat and Intact N/A N/A Granulation Amount: Large (67-100%) N/A N/A Granulation Quality: Red N/A N/A Necrotic Amount: None Present (0%) N/A N/A Exposed Structures: Fat Layer (Subcutaneous Tissue): N/A N/A Yes Fascia: No Tendon: No Muscle: No Joint: No Bone: No Epithelialization: Small (1-33%) N/A N/A Treatment Notes Electronic Signature(s) Signed: 04/25/2022 3:54:37 PM By: Carlene Coria RN Entered By: Carlene Coria on 04/24/2022 08:13:24 [name] ([id number]) -------------------------------------------------------------------------------- Multi-Disciplinary Care Plan Details Patient Name: [name] Date of Service: 04/24/2022 8:00 AM Medical Record Number: [id number] Patient Account Number: [id number] Date of Birth/Sex: 1986/08/04 (35 y.o. F) Treating RN: Carlene Coria Primary Care Destin Kittler: Jac Canavan Other Clinician: Referring Icie Kuznicki: Jac Canavan Treating Othon Guardia/Extender: Tito Dine in Treatment: 12 Active Inactive Wound/Skin Impairment Nursing Diagnoses: Knowledge deficit related to ulceration/compromised skin integrity Goals: Patient/caregiver will verbalize understanding of skin care regimen Date Initiated: 01/27/2022 Target Resolution Date: 05/25/2022 Goal Status: Active Ulcer/skin breakdown will have a volume reduction of 30% by  week 4 Date Initiated: 01/27/2022 Date Inactivated: 04/24/2022 Target Resolution Date: 03/29/2022 Goal Status: Met Ulcer/skin breakdown will have a volume reduction of 50% by week 8 Date Initiated: 01/27/2022 Date Inactivated: 04/24/2022 Target Resolution Date: 04/28/2022 Goal Status: Met Ulcer/skin breakdown will have a volume reduction of 80% by week 12 Date Initiated: 01/27/2022 Target Resolution Date: 05/29/2022 Goal Status: Active Ulcer/skin breakdown will heal within 14 weeks Date Initiated: 01/27/2022 Target Resolution Date: 06/29/2022 Goal Status: Active Interventions: Assess patient/caregiver ability to obtain necessary supplies Assess patient/caregiver ability to perform ulcer/skin care regimen upon admission and as needed Assess ulceration(s) every visit Notes: Electronic Signature(s) Signed: 04/25/2022 3:54:37 PM By: Carlene Coria RN Entered By: Carlene Coria on 04/24/2022 08:13:17 [name] ([id number]) -------------------------------------------------------------------------------- Pain Assessment Details Patient Name: [name] Date of Service: 04/24/2022 8:00 AM Medical Record Number: [id number] Patient Account Number: [id number] Date of Birth/Sex: 1986/06/03 (36 y.o. F) Treating RN: Carlene Coria Primary Care Aino Heckert: Jac Canavan Other Clinician: Referring Lurlene Ronda: Jac Canavan Treating Brookelyn Gaynor/Extender: Tito Dine in Treatment: 12 Active Problems Location of Pain Severity and Description of Pain Patient Has Paino No Site Locations Pain Management and Medication Current Pain Management: Electronic Signature(s) Signed: 04/25/2022 3:54:37 PM By: Carlene Coria RN Entered By: Carlene Coria on 04/24/2022 08:08:37 [name] ([id number]) -------------------------------------------------------------------------------- Patient/Caregiver Education Details Patient Name: [name] Date of Service: 04/24/2022 8:00 AM Medical Record Number:  [id number] Patient Account Number: [id number] Date of Birth/Gender: 04-09-1986 (35 y.o. F) Treating RN: Carlene Coria Primary Care Physician: Jac Canavan Other Clinician: Referring Physician: Jac Canavan Treating Physician/Extender: Tito Dine in Treatment: 12 Education Assessment Education Provided To: Patient Education Topics Provided Wound/Skin Impairment: Methods: Explain/Verbal Responses: State content correctly Electronic Signature(s) Signed: 04/25/2022 3:54:37 PM By: Carlene Coria RN Entered By: Carlene Coria on 04/24/2022 09:03:11 [name] ([id number]) -------------------------------------------------------------------------------- Wound Assessment Details Patient Name: [name] Date of Service: 04/24/2022 8:00 AM Medical Record Number: [id number] Patient Account Number: [id number] Date of Birth/Sex: 1986-09-03 (35 y.o. F) Treating RN: Carlene Coria Primary Care Donna Snooks: Jac Canavan Other Clinician: Referring Channa Hazelett: Jac Canavan Treating Kaylee Wombles/Extender: Tito Dine in Treatment: 12 Wound Status Wound Number: 1 Primary Etiology: Abscess Wound Location: Right Amputation Site - Below Knee Wound Status: Open Wounding Event: Gradually Appeared Comorbid History: Hypertension, Type II Diabetes Date Acquired: 01/14/2022 Weeks Of Treatment: 12 Clustered Wound: No Photos Wound Measurements Length: (cm) 3 Width: (cm) 4.6 Depth: (cm) 0.2 Area: (cm) 10.838 Volume: (cm) 2.168 % Reduction in Area: 59.9% %  Reduction in Volume: 97.9% Epithelialization: Small (1-33%) Tunneling: No Undermining: No Wound Description Classification: Full Thickness With Exposed Support Structures Wound Margin: Flat and Intact Exudate Amount: Medium Exudate Type: Serosanguineous Exudate Color: red, brown Foul Odor After Cleansing: No Slough/Fibrino No Wound Bed Granulation Amount: Large (67-100%) Exposed Structure Granulation Quality:  Red Fascia Exposed: No Necrotic Amount: None Present (0%) Fat Layer (Subcutaneous Tissue) Exposed: Yes Tendon Exposed: No Muscle Exposed: No Joint Exposed: No Bone Exposed: No Treatment Notes Wound #1 (Amputation Site - Below Knee) Wound Laterality: Right Cleanser Peri-Wound Care Topical DANELIA, SNODGRASS ([id number]) Primary Dressing Secondary Dressing Secured With Compression Wrap Compression Stockings Add-Ons Electronic Signature(s) Signed: 04/25/2022 3:54:37 PM By: Carlene Coria RN Entered By: Carlene Coria on 04/24/2022 08:11:29 [name] ([id number]) -------------------------------------------------------------------------------- Vitals Details Patient Name: [name] Date of Service: 04/24/2022 8:00 AM Medical Record Number: [id number] Patient Account Number: [id number] Date of Birth/Sex: 1986/04/29 (36 y.o. F) Treating RN: Carlene Coria Primary Care Blayke Pinera: Jac Canavan Other Clinician: Referring Nahomy Limburg: Jac Canavan Treating Khair Chasteen/Extender: Tito Dine in Treatment: 12 Vital Signs Time Taken: 08:08 Temperature (F): 98.2 Height (in): 67 Pulse (bpm): 102 Weight (lbs): 219 Respiratory Rate (breaths/min): 18 Body Mass Index (BMI): 34.3 Blood Pressure (mmHg): 127/86 Reference Range: 80 - 120 mg / dl Electronic Signature(s) Signed: 04/25/2022 3:54:37 PM By: Carlene Coria RN Entered By: Carlene Coria on 04/24/2022 09:40:76

## 2022-04-25 NOTE — Progress Notes (Signed)
[name], [name] ([id number]) Visit Report for 04/24/2022 HPI Details Patient Name: [name], [name] Date of Service: 04/24/2022 8:00 AM Medical Record Number: [id number] Patient Account Number: [id number] Date of Birth/Sex: Dec 11, 1985 (36 y.o. F) Treating RN: Yevonne Pax Primary Care Provider: Lynnell Chad Other Clinician: Referring Provider: Lynnell Chad Treating Provider/Extender: Altamese Bethany in Treatment: 12 History of Present Illness HPI Description: 01-27-2022 upon evaluation today patient appears to be doing somewhat poorly in regard to her right below-knee amputation. This unfortunately developed a abscess internally following the initial amputation which was January 2023. Subsequently she was getting very close to starting to work on the prosthesis when she had an area that somewhat bulged out ended up being an abscess and the incision and drainage was performed April 22. Subsequently she did have antibiotic beads placed in a wound VAC after this was cleared out. Her most recent reported hemoglobin A1c was 6.4. With that being said the patient tells me that her blood sugars seem to be under pretty good control as best she can tell at this point. She also tells me that the wound VAC seems to be functioning appropriately although upon evaluation the did not appear to be anything tucked into the region of undermining and depth in the roughly 9-12 o'clock location based on what we saw today. This is also where the antibiotic beads are and I can see those in place. I do believe that she is getting need to have the wound VAC tucked down into this location. Patient does have a history of diabetes mellitus type 2, diabetic neuropathy, and hypertension. This initial amputation occurred as a result of her having sewing needles that actually went into her heel she did not know about it started having trouble with her heel and ended up with a significant infection which was stated to be  necrotizing and she subsequently had to have emergency amputation. 02-13-2022 upon evaluation today patient appears to be doing well with regard to her wound. She has been tolerating the dressing changes without complication. The wound VAC does seem to be doing a great job. Fortunately I do not see any evidence of active infection locally or systemically at this time which is great news. She still has some of the antibiotic beads in the base of the wound. 02-20-2022 upon evaluation today patient appears to be doing well currently. There does not appear to be any evidence of active infection locally nor systemically which is great news and overall I am extremely pleased with where we stand today. I do believe that the wound VAC is doing a good job here and the color of the drainage is improved again were unsure as to whether this may have been the antibiotic beads or if there was something such as Pseudomonas causing the greenish discoloration to the drainage either way she is doing significantly better. 03-06-2022 upon evaluation today patient appears to be doing well with regard to her wound. The only issue I see is that she does have a little bit of a pocket of fluid that collected in the deeper section everything else is actually measuring smaller and looks better. This was a point where I really did expect this was been a turnaround and start doing a lot better. I think we are especially seeing that right now. 03-20-2022 upon evaluation today patient appears to be doing well with regard to her wound in fact this is actually significantly improved compared to last time I saw her. Fortunately I do not  see any evidence of active infection at this time. No fevers, chills, nausea, vomiting, or diarrhea. 03-27-2022 upon evaluation today patient appears to be doing excellent in regard to her wound. This is showing signs of improvement which is great news and overall I am extremely pleased with where we stand  currently. I do not see any evidence of active infection locally or systemically which is great news. No fevers, chills, nausea, vomiting, or diarrhea. 04-03-2022 upon evaluation today patient's wound is showing signs of significant improvement. I am still very pleased with where we stand and how things are going. I think that she is really making excellent progress which is great news. I do not see any signs of active infection at this time. 04-10-2022 upon evaluation today patient's wound actually showed signs of good granulation and epithelization at this point. Fortunately I do not see any evidence of active infection at this time which is great news and overall I am extremely pleased with where things stand currently. No fevers, chills, nausea, vomiting, or diarrhea. 04-17-2022 upon evaluation today patient appears to be doing well with regard to her wound she is actually showing signs of significant improvement which is great news. I do believe that she is making good progress with the current wound care measures. Fortunately I do not see any evidence of active infection locally or systemically which is great news. 7/24; right BKA site secondary to an abscess. Per our intake nurse wound VAC came off 2 weeks ago she has been using silver collagen. Nice improvements in wound dimensions. She does not have a prosthesis as of yet Electronic Signature(s) Signed: 04/24/2022 4:17:57 PM By: Linton Ham MD Entered By: Linton Ham on 04/24/2022 08:19:30 [name] ([id number]) -------------------------------------------------------------------------------- Physical Exam Details Patient Name: [name] Date of Service: 04/24/2022 8:00 AM Medical Record Number: [id number] Patient Account Number: [id number] Date of Birth/Sex: 1986/06/08 (35 y.o. F) Treating RN: Carlene Coria Primary Care Provider: Jac Canavan Other Clinician: Referring Provider: Jac Canavan Treating Provider/Extender:  Tito Dine in Treatment: 12 Constitutional Sitting or standing Blood Pressure is within target range for patient.. Pulse regular and within target range for patient.Marland Kitchen Respirations regular, non- labored and within target range.. Temperature is normal and within the target range for the patient.Marland Kitchen appears in no distress. Notes Wound exam; really nice granulation with surrounding epithelialization. No evidence of surrounding infection. The stump itself is slightly swollen. Popliteal pulses vibrant Electronic Signature(s) Signed: 04/24/2022 4:17:57 PM By: Linton Ham MD Entered By: Linton Ham on 04/24/2022 08:20:47 [name] ([id number]) -------------------------------------------------------------------------------- Physician Orders Details Patient Name: [name] Date of Service: 04/24/2022 8:00 AM Medical Record Number: [id number] Patient Account Number: [id number] Date of Birth/Sex: 01-23-1986 (35 y.o. F) Treating RN: Carlene Coria Primary Care Provider: Jac Canavan Other Clinician: Referring Provider: Jac Canavan Treating Provider/Extender: Tito Dine in Treatment: 12 Verbal / Phone Orders: No Diagnosis Coding ICD-10 Coding Code Description E11.622 Type 2 diabetes mellitus with other skin ulcer T81.31XA Disruption of external operation (surgical) wound, not elsewhere classified, initial encounter L97.812 Non-pressure chronic ulcer of other part of right lower leg with fat layer exposed E11.43 Type 2 diabetes mellitus with diabetic autonomic (poly)neuropathy I10 Essential (primary) hypertension Follow-up Appointments o Return Appointment in 1 week. Bathing/ Shower/ Hygiene o May shower; gently cleanse wound with antibacterial soap, rinse and pat dry prior to dressing wounds Negative Pressure Wound Therapy o Discontinue NPWT. Wound Treatment Electronic Signature(s) Signed: 04/24/2022 4:17:57 PM By: Linton Ham MD Signed:  04/25/2022  3:54:37 PM By: Carlene Coria RN Entered By: Carlene Coria on 04/24/2022 09:02:19 [name] ([id number]) -------------------------------------------------------------------------------- Problem List Details Patient Name: [name] Date of Service: 04/24/2022 8:00 AM Medical Record Number: [id number] Patient Account Number: [id number] Date of Birth/Sex: May 09, 1986 (35 y.o. F) Treating RN: Carlene Coria Primary Care Provider: Jac Canavan Other Clinician: Referring Provider: Jac Canavan Treating Provider/Extender: Tito Dine in Treatment: 12 Active Problems ICD-10 Encounter Code Description Active Date MDM Diagnosis E11.622 Type 2 diabetes mellitus with other skin ulcer 01/27/2022 No Yes T81.31XA Disruption of external operation (surgical) wound, not elsewhere 01/27/2022 No Yes classified, initial encounter L97.812 Non-pressure chronic ulcer of other part of right lower leg with fat layer 01/27/2022 No Yes exposed E11.43 Type 2 diabetes mellitus with diabetic autonomic (poly)neuropathy 01/27/2022 No Yes I10 Essential (primary) hypertension 01/27/2022 No Yes Inactive Problems Resolved Problems Electronic Signature(s) Signed: 04/24/2022 4:17:57 PM By: Linton Ham MD Entered By: Linton Ham on 04/24/2022 08:18:30 [name] ([id number]) -------------------------------------------------------------------------------- Progress Note Details Patient Name: [name] Date of Service: 04/24/2022 8:00 AM Medical Record Number: [id number] Patient Account Number: [id number] Date of Birth/Sex: [birth year] (35 y.o. F) Treating RN: Carlene Coria Primary Care Provider: Jac Canavan Other Clinician: Referring Provider: Jac Canavan Treating Provider/Extender: Tito Dine in Treatment: 12 Subjective History of Present Illness (HPI) 01-27-2022 upon evaluation today patient appears to be doing somewhat poorly in regard to her right below-knee  amputation. This unfortunately developed a abscess internally following the initial amputation which was January 2023. Subsequently she was getting very close to starting to work on the prosthesis when she had an area that somewhat bulged out ended up being an abscess and the incision and drainage was performed April 22. Subsequently she did have antibiotic beads placed in a wound VAC after this was cleared out. Her most recent reported hemoglobin A1c was 6.4. With that being said the patient tells me that her blood sugars seem to be under pretty good control as best she can tell at this point. She also tells me that the wound VAC seems to be functioning appropriately although upon evaluation the did not appear to be anything tucked into the region of undermining and depth in the roughly 9-12 o'clock location based on what we saw today. This is also where the antibiotic beads are and I can see those in place. I do believe that she is getting need to have the wound VAC tucked down into this location. Patient does have a history of diabetes mellitus type 2, diabetic neuropathy, and hypertension. This initial amputation occurred as a result of her having sewing needles that actually went into her heel she did not know about it started having trouble with her heel and ended up with a significant infection which was stated to be necrotizing and she subsequently had to have emergency amputation. 02-13-2022 upon evaluation today patient appears to be doing well with regard to her wound. She has been tolerating the dressing changes without complication. The wound VAC does seem to be doing a great job. Fortunately I do not see any evidence of active infection locally or systemically at this time which is great news. She still has some of the antibiotic beads in the base of the wound. 02-20-2022 upon evaluation today patient appears to be doing well currently. There does not appear to be any evidence of active  infection locally nor systemically which is great news and overall I am extremely pleased with where we stand today. I do believe  that the wound VAC is doing a good job here and the color of the drainage is improved again were unsure as to whether this may have been the antibiotic beads or if there was something such as Pseudomonas causing the greenish discoloration to the drainage either way she is doing significantly better. 03-06-2022 upon evaluation today patient appears to be doing well with regard to her wound. The only issue I see is that she does have a little bit of a pocket of fluid that collected in the deeper section everything else is actually measuring smaller and looks better. This was a point where I really did expect this was been a turnaround and start doing a lot better. I think we are especially seeing that right now. 03-20-2022 upon evaluation today patient appears to be doing well with regard to her wound in fact this is actually significantly improved compared to last time I saw her. Fortunately I do not see any evidence of active infection at this time. No fevers, chills, nausea, vomiting, or diarrhea. 03-27-2022 upon evaluation today patient appears to be doing excellent in regard to her wound. This is showing signs of improvement which is great news and overall I am extremely pleased with where we stand currently. I do not see any evidence of active infection locally or systemically which is great news. No fevers, chills, nausea, vomiting, or diarrhea. 04-03-2022 upon evaluation today patient's wound is showing signs of significant improvement. I am still very pleased with where we stand and how things are going. I think that she is really making excellent progress which is great news. I do not see any signs of active infection at this time. 04-10-2022 upon evaluation today patient's wound actually showed signs of good granulation and epithelization at this point. Fortunately I do  not see any evidence of active infection at this time which is great news and overall I am extremely pleased with where things stand currently. No fevers, chills, nausea, vomiting, or diarrhea. 04-17-2022 upon evaluation today patient appears to be doing well with regard to her wound she is actually showing signs of significant improvement which is great news. I do believe that she is making good progress with the current wound care measures. Fortunately I do not see any evidence of active infection locally or systemically which is great news. 7/24; right BKA site secondary to an abscess. Per our intake nurse wound VAC came off 2 weeks ago she has been using silver collagen. Nice improvements in wound dimensions. She does not have a prosthesis as of yet Objective Constitutional Sitting or standing Blood Pressure is within target range for patient.. Pulse regular and within target range for patient.Marland Kitchen Respirations regular, non- labored and within target range.. Temperature is normal and within the target range for the patient.Marland Kitchen appears in no distress. [name], [name] ([id number]) Vitals Time Taken: 8:08 AM, Height: 67 in, Weight: 219 lbs, BMI: 34.3, Temperature: 98.2 F, Pulse: 102 bpm, Respiratory Rate: 18 breaths/min, Blood Pressure: 127/86 mmHg. General Notes: Wound exam; really nice granulation with surrounding epithelialization. No evidence of surrounding infection. The stump itself is slightly swollen. Popliteal pulses vibrant Integumentary (Hair, Skin) Wound #1 status is Open. Original cause of wound was Gradually Appeared. The date acquired was: 01/14/2022. The wound has been in treatment 12 weeks. The wound is located on the Right Amputation Site - Below Knee. The wound measures 3cm length x 4.6cm width x 0.2cm depth; 10.838cm^2 area and 2.168cm^3 volume. There is Fat Layer (Subcutaneous Tissue)  exposed. There is no tunneling or undermining noted. There is a medium amount of serosanguineous  drainage noted. The wound margin is flat and intact. There is large (67-100%) red granulation within the wound bed. There is no necrotic tissue within the wound bed. Assessment Active Problems ICD-10 Type 2 diabetes mellitus with other skin ulcer Disruption of external operation (surgical) wound, not elsewhere classified, initial encounter Non-pressure chronic ulcer of other part of right lower leg with fat layer exposed Type 2 diabetes mellitus with diabetic autonomic (poly)neuropathy Essential (primary) hypertension Plan 1. Absolutely no reason to change the primary dressing which is silver collagen 2. We were going to try her stump shrinker however it is too large so we put her back in Tubigrip 3. Silver collagen she is changing twice a week herself and we change it on Mondays. 4. As noted she does not have a prosthesis. I have asked her to keep her stump elevated when she is sitting for long periods is much as possible Electronic Signature(s) Signed: 04/24/2022 4:17:57 PM By: Linton Ham MD Entered By: Linton Ham on 04/24/2022 08:21:44 [name] ([id number]) -------------------------------------------------------------------------------- SuperBill Details Patient Name: [name] Date of Service: 04/24/2022 Medical Record Number: [id number] Patient Account Number: [id number] Date of Birth/Sex: 1986-02-26 (35 y.o. F) Treating RN: Carlene Coria Primary Care Provider: Jac Canavan Other Clinician: Referring Provider: Jac Canavan Treating Provider/Extender: Tito Dine in Treatment: 12 Diagnosis Coding ICD-10 Codes Code Description E11.622 Type 2 diabetes mellitus with other skin ulcer T81.31XA Disruption of external operation (surgical) wound, not elsewhere classified, initial encounter L97.812 Non-pressure chronic ulcer of other part of right lower leg with fat layer exposed E11.43 Type 2 diabetes mellitus with diabetic autonomic  (poly)neuropathy I10 Essential (primary) hypertension Facility Procedures CPT4 Code: [id number] Description: 99213 - WOUND CARE VISIT-LEV 3 EST PT Modifier: Quantity: 1 Physician Procedures CPT4 Code: [id number] Description: 99213 - WC PHYS LEVEL 3 - EST PT Modifier: Quantity: 1 CPT4 Code: Description: ICD-10 Diagnosis Description [id number] Non-pressure chronic ulcer of other part of right lower leg with fat laye E11.622 Type 2 diabetes mellitus with other skin ulcer T81.31XA Disruption of external operation (surgical) wound, not elsewhere  classifi Modifier: r exposed ed, initial encounter Quantity: Electronic Signature(s) Signed: 04/24/2022 4:17:57 PM By: Linton Ham MD Signed: 04/25/2022 3:54:37 PM By: Carlene Coria RN Entered By: Carlene Coria on 04/24/2022 09:03:21

## 2022-04-27 ENCOUNTER — Ambulatory Visit: Payer: Medicaid Other

## 2022-05-01 ENCOUNTER — Encounter: Payer: Medicaid Other | Admitting: Physician Assistant

## 2022-05-01 DIAGNOSIS — E11622 Type 2 diabetes mellitus with other skin ulcer: Secondary | ICD-10-CM | POA: Diagnosis not present

## 2022-05-01 NOTE — Progress Notes (Addendum)
[name], [name] ([id number]) Visit Report for 05/01/2022 Chief Complaint Document Details Patient Name: Allison, Howell Date of Service: 05/01/2022 8:00 AM Medical Record Number: [id number] Patient Account Number: [id number] Date of Birth/Sex: 30-Oct-1985 (36 y.o. F) Treating RN: Yevonne Pax Primary Care Provider: Lynnell Chad Other Clinician: Referring Provider: Lynnell Chad Treating Provider/Extender: Rowan Blase in Treatment: 13 Information Obtained from: Patient Chief Complaint Right BKA Amputation Site Disruption Electronic Signature(s) Signed: 05/01/2022 8:21:02 AM By: Lenda Kelp PA-C Entered By: Lenda Kelp on 05/01/2022 08:21:02 [name] ([id number]) -------------------------------------------------------------------------------- HPI Details Patient Name: [name] Date of Service: 05/01/2022 8:00 AM Medical Record Number: [id number] Patient Account Number: [id number] Date of Birth/Sex: 10-15-1985 (35 y.o. F) Treating RN: Yevonne Pax Primary Care Provider: Lynnell Chad Other Clinician: Referring Provider: Lynnell Chad Treating Provider/Extender: Rowan Blase in Treatment: 13 History of Present Illness HPI Description: 01-27-2022 upon evaluation today patient appears to be doing somewhat poorly in regard to her right below-knee amputation. This unfortunately developed a abscess internally following the initial amputation which was January 2023. Subsequently she was getting very close to starting to work on the prosthesis when she had an area that somewhat bulged out ended up being an abscess and the incision and drainage was performed April 22. Subsequently she did have antibiotic beads placed in a wound VAC after this was cleared out. Her most recent reported hemoglobin A1c was 6.4. With that being said the patient tells me that her blood sugars seem to be under pretty good control as best she can tell at this point. She also tells me that the  wound VAC seems to be functioning appropriately although upon evaluation the did not appear to be anything tucked into the region of undermining and depth in the roughly 9-12 o'clock location based on what we saw today. This is also where the antibiotic beads are and I can see those in place. I do believe that she is getting need to have the wound VAC tucked down into this location. Patient does have a history of diabetes mellitus type 2, diabetic neuropathy, and hypertension. This initial amputation occurred as a result of her having sewing needles that actually went into her heel she did not know about it started having trouble with her heel and ended up with a significant infection which was stated to be necrotizing and she subsequently had to have emergency amputation. 02-13-2022 upon evaluation today patient appears to be doing well with regard to her wound. She has been tolerating the dressing changes without complication. The wound VAC does seem to be doing a great job. Fortunately I do not see any evidence of active infection locally or systemically at this time which is great news. She still has some of the antibiotic beads in the base of the wound. 02-20-2022 upon evaluation today patient appears to be doing well currently. There does not appear to be any evidence of active infection locally nor systemically which is great news and overall I am extremely pleased with where we stand today. I do believe that the wound VAC is doing a good job here and the color of the drainage is improved again were unsure as to whether this may have been the antibiotic beads or if there was something such as Pseudomonas causing the greenish discoloration to the drainage either way she is doing significantly better. 03-06-2022 upon evaluation today patient appears to be doing well with regard to her wound. The only issue I see is that she does have  a little bit of a pocket of fluid that collected in the deeper  section everything else is actually measuring smaller and looks better. This was a point where I really did expect this was been a turnaround and start doing a lot better. I think we are especially seeing that right now. 03-20-2022 upon evaluation today patient appears to be doing well with regard to her wound in fact this is actually significantly improved compared to last time I saw her. Fortunately I do not see any evidence of active infection at this time. No fevers, chills, nausea, vomiting, or diarrhea. 03-27-2022 upon evaluation today patient appears to be doing excellent in regard to her wound. This is showing signs of improvement which is great news and overall I am extremely pleased with where we stand currently. I do not see any evidence of active infection locally or systemically which is great news. No fevers, chills, nausea, vomiting, or diarrhea. 04-03-2022 upon evaluation today patient's wound is showing signs of significant improvement. I am still very pleased with where we stand and how things are going. I think that she is really making excellent progress which is great news. I do not see any signs of active infection at this time. 04-10-2022 upon evaluation today patient's wound actually showed signs of good granulation and epithelization at this point. Fortunately I do not see any evidence of active infection at this time which is great news and overall I am extremely pleased with where things stand currently. No fevers, chills, nausea, vomiting, or diarrhea. 04-17-2022 upon evaluation today patient appears to be doing well with regard to her wound she is actually showing signs of significant improvement which is great news. I do believe that she is making good progress with the current wound care measures. Fortunately I do not see any evidence of active infection locally or systemically which is great news. 7/24; right BKA site secondary to an abscess. Per our intake nurse wound VAC  came off 2 weeks ago she has been using silver collagen. Nice improvements in wound dimensions. She does not have a prosthesis as of yet 05-01-2022 upon evaluation today patient appears to be doing well with regard to her wound in fact this is measuring significantly smaller and is really doing quite well. Fortunately I do not see any evidence of active infection locally or systemically at this time. Electronic Signature(s) Signed: 05/01/2022 8:36:11 AM By: Lenda Kelp PA-C Entered By: Lenda Kelp on 05/01/2022 08:36:10 [name] ([id number]) -------------------------------------------------------------------------------- Physical Exam Details Patient Name: [name] Date of Service: 05/01/2022 8:00 AM Medical Record Number: [id number] Patient Account Number: [id number] Date of Birth/Sex: [birth year] (35 y.o. F) Treating RN: Yevonne Pax Primary Care Provider: Lynnell Chad Other Clinician: Referring Provider: Lynnell Chad Treating Provider/Extender: Rowan Blase in Treatment: 13 Constitutional Well-nourished and well-hydrated in no acute distress. Respiratory normal breathing without difficulty. Psychiatric this patient is able to make decisions and demonstrates good insight into disease process. Alert and Oriented x 3. pleasant and cooperative. Notes Patient's wound bed showed evidence of excellent improvement overall I am extremely pleased with where we stand I do believe that she is making great progress. I do not see any evidence of active infection at this time. Electronic Signature(s) Signed: 05/01/2022 8:36:27 AM By: Lenda Kelp PA-C Entered By: Lenda Kelp on 05/01/2022 08:36:27 [name] ([id number]) -------------------------------------------------------------------------------- Physician Orders Details Patient Name: [name] Date of Service: 05/01/2022 8:00 AM Medical Record Number: [id number] Patient Account Number: [id number]  Date  of Birth/Sex: [birth year] (35 y.o. 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 Bathing/ Shower/ Hygiene o May shower; gently cleanse wound with antibacterial soap, rinse and pat dry prior to dressing wounds Negative Pressure Wound Therapy o Discontinue NPWT. Wound Treatment Wound #1 - Amputation Site - Below Knee Wound Laterality: Right Primary Dressing: Prisma 4.34 (in) (Generic) 3 x Per Week/30 Days Discharge Instructions: Moisten w/normal saline or sterile water; Cover wound as directed. Do not remove from wound bed. Secondary Dressing: ABD Pad 5x9 (in/in) (Generic) 3 x Per Week/30 Days Discharge Instructions: Cover with ABD pad Secured With: Medipore Tape - 52M Medipore H Soft Cloth Surgical Tape, 2x2 (in/yd) 3 x Per Week/30 Days Electronic Signature(s) Signed: 05/01/2022 4:47:26 PM By: Yevonne Pax RN Signed: 05/01/2022 5:40:09 PM By: Lenda Kelp PA-C Entered By: Yevonne Pax on 05/01/2022 08:32:19 [name] ([id number]) -------------------------------------------------------------------------------- Problem List Details Patient Name: [name] Date of Service: 05/01/2022 8:00 AM Medical Record Number: [id number] Patient Account Number: [id number] Date of Birth/Sex: Sep 02, 1986 (35 y.o. F) Treating RN: Yevonne Pax Primary Care Provider: Lynnell Chad Other Clinician: Referring Provider: Lynnell Chad Treating Provider/Extender: Rowan Blase in Treatment: 13 Active Problems ICD-10 Encounter Code Description Active Date MDM Diagnosis E11.622 Type 2 diabetes mellitus with other skin ulcer 01/27/2022 No Yes T81.31XA Disruption of external operation (surgical) wound, not elsewhere 01/27/2022 No Yes classified, initial encounter L97.812 Non-pressure chronic ulcer of other part of right lower leg with fat layer 01/27/2022 No Yes exposed E11.43 Type 2 diabetes mellitus with diabetic autonomic (poly)neuropathy 01/27/2022 No Yes I10 Essential (primary) hypertension 01/27/2022 No Yes Inactive Problems Resolved Problems Electronic Signature(s) Signed: 05/01/2022 8:20:59 AM By: Lenda Kelp PA-C Entered By: Lenda Kelp on 05/01/2022 08:20:59 [name] ([id number]) -------------------------------------------------------------------------------- Progress Note Details Patient Name: [name] Date of Service: 05/01/2022 8:00 AM Medical Record Number: [id number] Patient Account Number: [id number] Date of Birth/Sex: [birth year] (35 y.o. F) Treating RN: Yevonne Pax Primary Care Provider: Lynnell Chad Other Clinician: Referring Provider: Lynnell Chad Treating Provider/Extender: Rowan Blase in Treatment: 13 Subjective Chief Complaint Information obtained from Patient Right BKA Amputation Site Disruption History of Present Illness (HPI) 01-27-2022 upon evaluation today patient appears to be doing somewhat poorly in regard to her right below-knee amputation. This unfortunately developed a abscess internally following the initial amputation which was January 2023. Subsequently she was getting very close to starting to work on the prosthesis when she had an area that somewhat bulged out ended up being an abscess and the incision and drainage was performed April 22. Subsequently she did have antibiotic beads placed in  a wound VAC after this was cleared out. Her most recent reported hemoglobin A1c was 6.4. With that being said the patient tells me that her blood sugars seem to be under pretty good control as best she can tell at this point. She also tells me that the wound VAC seems to be functioning appropriately although upon evaluation the did not appear to be anything tucked  into the region of undermining and depth in the roughly 9-12 o'clock location based on what we saw today. This is also where the antibiotic beads are and I can see those in place. I do believe that she is getting need to have the wound VAC tucked down into this location. Patient does have a history of diabetes mellitus type 2, diabetic neuropathy, and hypertension. This initial amputation occurred as a result of her having sewing needles that actually went into her heel she did not know about it started having trouble with her heel and ended up with a significant infection which was stated to be necrotizing and she subsequently had to have emergency amputation. 02-13-2022 upon evaluation today patient appears to be doing well with regard to her wound. She has been tolerating the dressing changes without complication. The wound VAC does seem to be doing a great job. Fortunately I do not see any evidence of active infection locally or systemically at this time which is great news. She still has some of the antibiotic beads in the base of the wound. 02-20-2022 upon evaluation today patient appears to be doing well currently. There does not appear to be any evidence of active infection locally nor systemically which is great news and overall I am extremely pleased with where we stand today. I do believe that the wound VAC is doing a good job here and the color of the drainage is improved again were unsure as to whether this may have been the antibiotic beads or if there was something such as Pseudomonas causing the greenish discoloration to the  drainage either way she is doing significantly better. 03-06-2022 upon evaluation today patient appears to be doing well with regard to her wound. The only issue I see is that she does have a little bit of a pocket of fluid that collected in the deeper section everything else is actually measuring smaller and looks better. This was a point where I really did expect this was been a turnaround and start doing a lot better. I think we are especially seeing that right now. 03-20-2022 upon evaluation today patient appears to be doing well with regard to her wound in fact this is actually significantly improved compared to last time I saw her. Fortunately I do not see any evidence of active infection at this time. No fevers, chills, nausea, vomiting, or diarrhea. 03-27-2022 upon evaluation today patient appears to be doing excellent in regard to her wound. This is showing signs of improvement which is great news and overall I am extremely pleased with where we stand currently. I do not see any evidence of active infection locally or systemically which is great news. No fevers, chills, nausea, vomiting, or diarrhea. 04-03-2022 upon evaluation today patient's wound is showing signs of significant improvement. I am still very pleased with where we stand and how things are going. I think that she is really making excellent progress which is great news. I do not see any signs of active infection at this time. 04-10-2022 upon evaluation today patient's wound actually showed signs of good granulation and epithelization at this point. Fortunately I do not see any evidence of active infection at this time which is great news and overall I am extremely pleased with where things stand currently. No fevers, chills, nausea, vomiting, or diarrhea. 04-17-2022 upon evaluation today patient appears to be doing well with regard to her wound she is actually showing signs of significant improvement which is great news. I do  believe  that she is making good progress with the current wound care measures. Fortunately I do not see any evidence of active infection locally or systemically which is great news. 7/24; right BKA site secondary to an abscess. Per our intake nurse wound VAC came off 2 weeks ago she has been using silver collagen. Nice improvements in wound dimensions. She does not have a prosthesis as of yet 05-01-2022 upon evaluation today patient appears to be doing well with regard to her wound in fact this is measuring significantly smaller and is really doing quite well. Fortunately I do not see any evidence of active infection locally or systemically at this time. KAZOUA, GOSSEN ([id number]) Objective Constitutional Well-nourished and well-hydrated in no acute distress. Vitals Time Taken: 8:09 AM, Height: 67 in, Weight: 219 lbs, BMI: 34.3, Temperature: 98.3 F, Pulse: 102 bpm, Respiratory Rate: 18 breaths/min, Blood Pressure: 144/94 mmHg. Respiratory normal breathing without difficulty. Psychiatric this patient is able to make decisions and demonstrates good insight into disease process. Alert and Oriented x 3. pleasant and cooperative. General Notes: Patient's wound bed showed evidence of excellent improvement overall I am extremely pleased with where we stand I do believe that she is making great progress. I do not see any evidence of active infection at this time. Integumentary (Hair, Skin) Wound #1 status is Open. Original cause of wound was Gradually Appeared. The date acquired was: 01/14/2022. The wound has been in treatment 13 weeks. The wound is located on the Right Amputation Site - Below Knee. The wound measures 2.5cm length x 3.9cm width x 0.1cm depth; 7.658cm^2 area and 0.766cm^3 volume. There is Fat Layer (Subcutaneous Tissue) exposed. There is no tunneling or undermining noted. There is a medium amount of serosanguineous drainage noted. The wound margin is flat and intact. There is large (67-100%)  red granulation within the wound bed. There is no necrotic tissue within the wound bed. 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 Bathing/ Shower/ Hygiene: 1. Would recommend that we go ahead and continue with the wound care measures as before and this includes the use of the silver collagen. 2. I am also can recommend that we have the patient continue with the ABD pad to cover and gauze to secure in place. 3. We are also going to continue with the Tubigrip which has done well for her at this point helping with edema control. She is going to talk to [place] about a new shrinker that would actually fit. We will see patient back for reevaluation in 1 week here in the clinic. If anything worsens or changes patient will contact our office for additional recommendations. May shower; gently cleanse wound with antibacterial soap, rinse and pat dry prior to dressing wounds Negative Pressure Wound Therapy: Discontinue NPWT. WOUND #1: - Amputation Site - Below Knee Wound Laterality: Right Primary Dressing: Prisma 4.34 (in) (Generic) 3 x Per Week/30 Days Discharge Instructions: Moisten w/normal saline or sterile water; Cover wound as directed. Do not remove from wound bed. Secondary Dressing: ABD Pad 5x9 (in/in) (Generic) 3 x Per Week/30 Days Discharge Instructions: Cover with ABD pad Secured With: Medipore Tape - 65M Medipore H Soft Cloth Surgical Tape, 2x2 (in/yd) 3 x Per Week/30 Days Stallsmith,  Nevayah ([id number]) 1. Would recommend that we go ahead and continue with the wound care measures as before and this includes the use of  the silver collagen. 2. I am also can recommend that we have the patient continue with the ABD pad to cover and gauze to secure in place. 3. We are also going to continue with the Tubigrip which has done well for her at this point helping with edema control. She is going to talk to [employer] about a new shrinker that would actually fit. We will see patient back for reevaluation in 1 week here in the clinic. If anything worsens or changes patient will contact our office for additional recommendations. Electronic Signature(s) Signed: 05/01/2022 8:37:03 AM By: Lenda KelpStone III, Clovia Reine PA-C Entered By: Lenda KelpStone III, Skyah Hannon on 05/01/2022 08:37:03 Allison RotaHYLTON, Zanobia ([id number]) -------------------------------------------------------------------------------- SuperBill Details Patient Name: Allison RotaHYLTON, Teriann Date of Service: 05/01/2022 Medical Record Number: [id number] Patient Account Number: [id number] Date of Birth/Sex: May 13, 1986 (35 y.o. F) Treating RN: Yevonne PaxEpps, Carrie Primary Care Provider: Lynnell ChadIjaola, Onyeje Other Clinician: Referring Provider: Lynnell ChadIjaola, Onyeje Treating Provider/Extender: Rowan BlaseStone, Divonte Senger Weeks in Treatment: 13 Diagnosis Coding ICD-10 Codes Code Description E11.622 Type 2 diabetes mellitus with other skin ulcer T81.31XA Disruption of external operation (surgical) wound, not elsewhere classified, initial encounter L97.812 Non-pressure chronic ulcer of other part of right lower leg with fat layer exposed E11.43 Type 2 diabetes mellitus with diabetic autonomic (poly)neuropathy I10 Essential (primary) hypertension Facility Procedures CPT4 Code: [id number] Description: [id number] - WOUND CARE VISIT-LEV 2 EST PT Modifier: Quantity: 1 Physician Procedures CPT4 Code: [id number] Description: 99213 - WC PHYS LEVEL 3 - EST PT Modifier: Quantity: 1 CPT4 Code: Description: ICD-10 Diagnosis Description E11.622 Type 2 diabetes mellitus with other skin ulcer T81.31XA Disruption of external operation (surgical) wound, not  elsewhere classifi L97.812 Non-pressure chronic ulcer of other part of right lower leg with  fat laye E11.43 Type 2 diabetes mellitus with diabetic autonomic (poly)neuropathy Modifier: ed, initial encounter r exposed Quantity: Electronic Signature(s) Signed: 05/01/2022 8:37:33 AM By: Lenda KelpStone III, Mariska Daffin PA-C Entered By: Lenda KelpStone III, Erique Kaser on 05/01/2022 08:37:33

## 2022-05-01 NOTE — Progress Notes (Signed)
LEGACIE, DILLINGHAM ([id number]) Visit Report for 05/01/2022 Arrival Information Details Patient Name: [name], [name] Date of Service: 05/01/2022 8:00 AM Medical Record Number: [id number] Patient Account Number: [id number] Date of Birth/Sex: 1985/12/22 (37 y.o. F) Treating RN: Carlene Coria Primary Care Keisy Strickler: Jac Canavan Other Clinician: Referring Saron Vanorman: Jac Canavan Treating Devin Ganaway/Extender: Skipper Cliche in Treatment: 38 Visit Information History Since Last Visit All ordered tests and consults were completed: No Patient Arrived: Wheel Chair Added or deleted any medications: No Arrival Time: 08:09 Any new allergies or adverse reactions: No Accompanied By: self Had a fall or experienced change in No Transfer Assistance: None activities of daily living that may affect Patient Identification Verified: Yes risk of falls: Secondary Verification Process Completed: Yes Signs or symptoms of abuse/neglect since last visito No Patient Requires Transmission-Based Precautions: No Hospitalized since last visit: No Patient Has Alerts: Yes Implantable device outside of the clinic excluding No cellular tissue based products placed in the center since last visit: Has Dressing in Place as Prescribed: Yes Pain Present Now: No Electronic Signature(s) Signed: 05/01/2022 4:29:54 PM By: Carlene Coria RN Previous Signature: 05/01/2022 4:28:59 PM Version By: Carlene Coria RN Entered By: Carlene Coria on 05/01/2022 16:29:54 [name] ([id number]) -------------------------------------------------------------------------------- Clinic Level of Care Assessment Details Patient Name: [name] Date of Service: 05/01/2022 8:00 AM Medical Record Number: [id number] Patient Account Number: [id number] Date of Birth/Sex: September 11, 1986 (36 y.o. F) Treating RN: Carlene Coria Primary Care Kenyatte Chatmon: Jac Canavan Other Clinician: Referring Cindee Mclester: Jac Canavan Treating Devonne Lalani/Extender: Skipper Cliche in Treatment: 13 Clinic Level of Care Assessment Items TOOL 4 Quantity Score X - Use when only an EandM is performed on FOLLOW-UP visit 1 0 ASSESSMENTS - Nursing Assessment / Reassessment _0  - Reassessment of Co-morbidities (includes updates in patient status) 0 _1  - 0 Reassessment of Adherence to Treatment Plan ASSESSMENTS - Wound and Skin Assessment / Reassessment X - Simple Wound Assessment / Reassessment - one wound 1 5 _2  - 0 Complex Wound Assessment / Reassessment - multiple wounds _3  - 0 Dermatologic / Skin Assessment (not related to wound area) ASSESSMENTS - Focused Assessment _4  - Circumferential Edema Measurements - multi extremities 0 _5  - 0 Nutritional Assessment / Counseling / Intervention _6  - 0 Lower Extremity Assessment (monofilament, tuning fork, pulses) _7  - 0 Peripheral Arterial Disease Assessment (using hand held doppler) ASSESSMENTS - Ostomy and/or Continence Assessment and Care _8  - Incontinence Assessment and Management 0 _9  - 0 Ostomy Care Assessment and Management (repouching, etc.) PROCESS - Coordination of Care X - Simple Patient / Family Education for ongoing care 1 15 _10  - 0 Complex (extensive) Patient / Family Education for ongoing care _11  - 0 Staff obtains Programmer, systems, Records, Test Results / Process Orders _12  - 0 Staff telephones HHA, Nursing Homes / Clarify orders / etc _13  - 0 Routine Transfer to another Facility (non-emergent condition) _14  - 0 Routine Hospital Admission (non-emergent condition) _15  - 0 New Admissions / Biomedical engineer / Ordering NPWT, Apligraf, etc. _16  - 0 Emergency Hospital Admission (emergent condition) X- 1 10 Simple Discharge Coordination _17  - 0 Complex (extensive) Discharge Coordination PROCESS - Special Needs _18  - Pediatric / Minor Patient Management 0 _19  - 0 Isolation Patient Management _20  - 0 Hearing / Language / Visual special needs _21  - 0 Assessment of Community assistance  (transportation, D/C planning, etc.) _22  - 0 Additional assistance / Altered mentation _23  - 0 Support Surface(s) Assessment (bed, cushion, seat, etc.) INTERVENTIONS - Wound Cleansing / Measurement Rueda, Roshelle ([id number]) X- 1  5 Simple Wound Cleansing - one wound _0  - 0 Complex Wound Cleansing - multiple wounds X- 1 5 Wound Imaging (photographs - any number of wounds) _1  - 0 Wound Tracing (instead of photographs) X- 1 5 Simple Wound Measurement - one wound _2  - 0 Complex Wound Measurement - multiple wounds INTERVENTIONS - Wound Dressings X - Small Wound Dressing one or multiple wounds 1 10 _3  - 0 Medium Wound Dressing one or multiple wounds _4  - 0 Large Wound Dressing one or multiple wounds <HALPFXTKWIOXBDZH>_2<\/DJMEQASTMHDQQIWL>_7  - 0 Application of Medications - topical <LGXQJJHERDEYCXKG>_8<\/JEHUDJSHFWYOVZCH>_8  - 0 Application of Medications - injection INTERVENTIONS - Miscellaneous _7  - External ear exam 0 _8  - 0 Specimen Collection (cultures, biopsies, blood, body fluids, etc.) _9  - 0 Specimen(s) / Culture(s) sent or taken to Lab for analysis _10  - 0 Patient Transfer (multiple staff / Civil Service fast streamer / Similar devices) _11  - 0 Simple Staple / Suture removal (25 or less) _12  - 0 Complex Staple / Suture removal (26 or more) _13  - 0 Hypo / Hyperglycemic Management (close monitor of Blood Glucose) _14  - 0 Ankle / Brachial Index (ABI) - do not check if billed separately X- 1 5 Vital Signs Has the patient been seen at the hospital within the last three years: Yes Total Score: 60 Level Of Care: New/Established - Level 2 Electronic Signature(s) Signed: 05/01/2022 4:47:26 PM By: Carlene Coria RN Entered By: Carlene Coria on 05/01/2022 08:33:26 [name] ([id number]) -------------------------------------------------------------------------------- Encounter Discharge Information Details Patient Name: [name] Date of Service: 05/01/2022 8:00 AM Medical Record Number: [id number] Patient Account Number: [id number] Date of Birth/Sex: May 11, 1986  (36 y.o. F) Treating RN: Carlene Coria Primary Care Larnce Schnackenberg: Jac Canavan Other Clinician: Referring Soraida Vickers: Jac Canavan Treating Tajanay Hurley/Extender: Skipper Cliche in Treatment: 13 Encounter Discharge Information Items Discharge Condition: Stable Ambulatory Status: Wheelchair Discharge Destination: Home Transportation: Private Auto Accompanied By: self Schedule Follow-up Appointment: Yes Clinical Summary of Care: Electronic Signature(s) Signed: 05/01/2022 4:47:26 PM By: Carlene Coria RN Entered By: Carlene Coria on 05/01/2022 08:34:22 [name] ([id number]) -------------------------------------------------------------------------------- Lower Extremity Assessment Details Patient Name: [name] Date of Service: 05/01/2022 8:00 AM Medical Record Number: [id number] Patient Account Number: [id number] Date of Birth/Sex: 11-04-1985 (35 y.o. F) Treating RN: Carlene Coria Primary Care Brion Sossamon: Jac Canavan Other Clinician: Referring Jovanny Stephanie: Jac Canavan Treating Tabias Swayze/Extender: Skipper Cliche in Treatment: 13 Electronic Signature(s) Signed: 05/01/2022 4:47:26 PM By: Carlene Coria RN Entered By: Carlene Coria on 05/01/2022 08:16:21 [name] ([id number]) -------------------------------------------------------------------------------- Multi Wound Chart Details Patient Name: [name] Date of Service: 05/01/2022 8:00 AM Medical Record Number: [id number] Patient Account Number: [id number] Date of Birth/Sex: 1985/10/16 (35 y.o. F) Treating RN: Carlene Coria Primary Care Daiton Cowles: Jac Canavan Other Clinician: Referring Reganne Messerschmidt: Jac Canavan Treating Marjory Meints/Extender: Skipper Cliche in Treatment: 13 Vital Signs Height(in): 67 Pulse(bpm): 102 Weight(lbs): 219 Blood Pressure(mmHg): 144/94 Body Mass Index(BMI): 34.3 Temperature(F): 98.3 Respiratory Rate(breaths/min): 18 Photos: [N/A:N/A] Wound Location: Right Amputation Site - Below Knee  N/A N/A Wounding Event: Gradually Appeared N/A N/A Primary Etiology: Abscess N/A N/A Comorbid History: Hypertension, Type II Diabetes N/A N/A Date Acquired: 01/14/2022 N/A N/A Weeks of Treatment: 13 N/A N/A Wound Status: Open N/A N/A Wound Recurrence: No N/A N/A Measurements L x W x D (cm) 2.5x3.9x0.1 N/A N/A Area (cm) : 7.658 N/A N/A Volume (cm) : 0.766 N/A N/A % Reduction in Area: 71.70% N/A N/A % Reduction in Volume: 99.30% N/A N/A Classification: Full Thickness With Exposed N/A N/A Support Structures Exudate Amount: Medium N/A N/A Exudate Type:  Serosanguineous N/A N/A Exudate Color: red, brown N/A N/A Wound Margin: Flat and Intact N/A N/A Granulation Amount: Large (67-100%) N/A N/A Granulation Quality: Red N/A N/A Necrotic Amount: None Present (0%) N/A N/A Exposed Structures: Fat Layer (Subcutaneous Tissue): N/A N/A Yes Fascia: No Tendon: No Muscle: No Joint: No Bone: No Epithelialization: Small (1-33%) N/A N/A Treatment Notes Electronic Signature(s) Signed: 05/01/2022 4:47:26 PM By: Carlene Coria RN Entered By: Carlene Coria on 05/01/2022 08:16:31 [name] ([id number]) -------------------------------------------------------------------------------- Multi-Disciplinary Care Plan Details Patient Name: [name] Date of Service: 05/01/2022 8:00 AM Medical Record Number: [id number] Patient Account Number: [id number] Date of Birth/Sex: 10/06/1985 (35 y.o. F) Treating RN: Carlene Coria Primary Care Wilmot Quevedo: Jac Canavan Other Clinician: Referring Amijah Timothy: Jac Canavan Treating [place] Searles/Extender: Skipper Cliche in Treatment: 13 Active Inactive Wound/Skin Impairment Nursing Diagnoses: Knowledge deficit related to ulceration/compromised skin integrity Goals: Patient/caregiver will verbalize understanding of skin care regimen Date Initiated: 01/27/2022 Target Resolution Date: 05/25/2022 Goal Status: Active Ulcer/skin breakdown will have a volume reduction  of 30% by week 4 Date Initiated: 01/27/2022 Date Inactivated: 04/24/2022 Target Resolution Date: 03/29/2022 Goal Status: Met Ulcer/skin breakdown will have a volume reduction of 50% by week 8 Date Initiated: 01/27/2022 Date Inactivated: 04/24/2022 Target Resolution Date: 04/28/2022 Goal Status: Met Ulcer/skin breakdown will have a volume reduction of 80% by week 12 Date Initiated: 01/27/2022 Target Resolution Date: 05/29/2022 Goal Status: Active Ulcer/skin breakdown will heal within 14 weeks Date Initiated: 01/27/2022 Target Resolution Date: 06/29/2022 Goal Status: Active Interventions: Assess patient/caregiver ability to obtain necessary supplies Assess patient/caregiver ability to perform ulcer/skin care regimen upon admission and as needed Assess ulceration(s) every visit Notes: Electronic Signature(s) Signed: 05/01/2022 4:47:26 PM By: Carlene Coria RN Entered By: Carlene Coria on 05/01/2022 08:16:24 [name] ([id number]) -------------------------------------------------------------------------------- Pain Assessment Details Patient Name: [name] Date of Service: 05/01/2022 8:00 AM Medical Record Number: [id number] Patient Account Number: [id number] Date of Birth/Sex: 03-Sep-1986 (36 y.o. F) Treating RN: Carlene Coria Primary Care Lindberg Zenon: Jac Canavan Other Clinician: Referring Deloss Amico: Jac Canavan Treating Michah Minton/Extender: Skipper Cliche in Treatment: 13 Active Problems Location of Pain Severity and Description of Pain Patient Has Paino No Site Locations Pain Management and Medication Current Pain Management: Electronic Signature(s) Signed: 05/01/2022 4:47:26 PM By: Carlene Coria RN Entered By: Carlene Coria on 05/01/2022 08:10:20 [name] ([id number]) -------------------------------------------------------------------------------- Patient/Caregiver Education Details Patient Name: [name] Date of Service: 05/01/2022 8:00 AM Medical Record  Number: [id number] Patient Account Number: [id number] Date of Birth/Gender: 05-31-1986 (35 y.o. F) Treating RN: Carlene Coria Primary Care Physician: Jac Canavan Other Clinician: Referring Physician: Jac Canavan Treating Physician/Extender: Skipper Cliche in Treatment: 13 Education Assessment Education Provided To: Patient Education Topics Provided Wound/Skin Impairment: Methods: Explain/Verbal Responses: State content correctly Electronic Signature(s) Signed: 05/01/2022 4:47:26 PM By: Carlene Coria RN Entered By: Carlene Coria on 05/01/2022 08:33:43 [name] ([id number]) -------------------------------------------------------------------------------- Wound Assessment Details Patient Name: [name] Date of Service: 05/01/2022 8:00 AM Medical Record Number: [id number] Patient Account Number: [id number] Date of Birth/Sex: 1986/06/06 (35 y.o. F) Treating RN: Carlene Coria Primary Care Orian Amberg: Jac Canavan Other Clinician: Referring Kaikoa Magro: Jac Canavan Treating Ziyon Cedotal/Extender: Skipper Cliche in Treatment: 13 Wound Status Wound Number: 1 Primary Etiology: Abscess Wound Location: Right Amputation Site - Below Knee Wound Status: Open Wounding Event: Gradually Appeared Comorbid History: Hypertension, Type II Diabetes Date Acquired: 01/14/2022 Weeks Of Treatment: 13 Clustered Wound: No Photos Wound Measurements Length: (cm) 2.5 Width: (cm) 3.9 Depth: (cm) 0.1 Area: (cm) 7.658 Volume: (cm) 0.766 % Reduction in Area: 71.7% %  Reduction in Volume: 99.3% Epithelialization: Small (1-33%) Tunneling: No Undermining: No Wound Description Classification: Full Thickness With Exposed Support Structures Wound Margin: Flat and Intact Exudate Amount: Medium Exudate Type: Serosanguineous Exudate Color: red, brown Foul Odor After Cleansing: No Slough/Fibrino No Wound Bed Granulation Amount: Large (67-100%) Exposed Structure Granulation Quality:  Red Fascia Exposed: No Necrotic Amount: None Present (0%) Fat Layer (Subcutaneous Tissue) Exposed: Yes Tendon Exposed: No Muscle Exposed: No Joint Exposed: No Bone Exposed: No Treatment Notes Wound #1 (Amputation Site - Below Knee) Wound Laterality: Right Cleanser Peri-Wound Care Topical Zollner, Shaquina ([id number]) Primary Dressing Prisma 4.34 (in) Discharge Instruction: Moisten w/normal saline or sterile water; Cover wound as directed. Do not remove from wound bed. Secondary Dressing ABD Pad 5x9 (in/in) Discharge Instruction: Cover with ABD pad Secured With [place] H Soft Cloth Surgical Tape, 2x2 (in/yd) Compression Wrap Compression Stockings Add-Ons Electronic Signature(s) Signed: 05/01/2022 4:47:26 PM By: Carlene Coria RN Entered By: Carlene Coria on 05/01/2022 08:15:44 [name] ([id number]) -------------------------------------------------------------------------------- Vitals Details Patient Name: [name] Date of Service: 05/01/2022 8:00 AM Medical Record Number: [id number] Patient Account Number: [id number] Date of Birth/Sex: [birth year] (36 y.o. F) Treating RN: Carlene Coria Primary Care Tamarah Bhullar: Jac Canavan Other Clinician: Referring Isaih Bulger: Jac Canavan Treating Duwane Gewirtz/Extender: Skipper Cliche in Treatment: 13 Vital Signs Time Taken: 08:09 Temperature (F): 98.3 Height (in): 67 Pulse (bpm): 102 Weight (lbs): 219 Respiratory Rate (breaths/min): 18 Body Mass Index (BMI): 34.3 Blood Pressure (mmHg): 144/94 Reference Range: 80 - 120 mg / dl Electronic Signature(s) Signed: 05/01/2022 4:47:26 PM By: Carlene Coria RN Entered By: Carlene Coria on 05/01/2022 08:10:08

## 2022-05-04 ENCOUNTER — Ambulatory Visit: Payer: Medicaid Other

## 2022-05-05 ENCOUNTER — Other Ambulatory Visit: Payer: Self-pay | Admitting: Nurse Practitioner

## 2022-05-08 ENCOUNTER — Encounter: Payer: Medicaid Other | Attending: Physician Assistant | Admitting: Physician Assistant

## 2022-05-08 DIAGNOSIS — L97812 Non-pressure chronic ulcer of other part of right lower leg with fat layer exposed: Secondary | ICD-10-CM | POA: Diagnosis not present

## 2022-05-08 DIAGNOSIS — Y835 Amputation of limb(s) as the cause of abnormal reaction of the patient, or of later complication, without mention of misadventure at the time of the procedure: Secondary | ICD-10-CM | POA: Diagnosis not present

## 2022-05-08 DIAGNOSIS — T8781 Dehiscence of amputation stump: Secondary | ICD-10-CM | POA: Insufficient documentation

## 2022-05-08 DIAGNOSIS — Z89511 Acquired absence of right leg below knee: Secondary | ICD-10-CM | POA: Insufficient documentation

## 2022-05-08 DIAGNOSIS — E11622 Type 2 diabetes mellitus with other skin ulcer: Secondary | ICD-10-CM | POA: Diagnosis not present

## 2022-05-08 DIAGNOSIS — E1143 Type 2 diabetes mellitus with diabetic autonomic (poly)neuropathy: Secondary | ICD-10-CM | POA: Insufficient documentation

## 2022-05-08 NOTE — Progress Notes (Addendum)
[name], [name] ([id number]) Visit Report for 05/08/2022 Chief Complaint Document Details Patient Name: [name], [name] Date of Service: 05/08/2022 8:00 AM Medical Record Number: [id number] Patient Account Number: [id number] Date of Birth/Sex: [birth year] (36 y.o. F) Treating RN: Yevonne PaxEpps, Carrie Primary Care Provider: Lynnell ChadIjaola, Onyeje Other Clinician: Referring Provider: Lynnell ChadIjaola, Onyeje Treating Provider/Extender: Rowan BlaseStone, Ausencio Vaden Weeks in Treatment: 14 Information Obtained from: Patient Chief Complaint Right BKA Amputation Site Disruption Electronic Signature(s) Signed: 05/08/2022 8:13:35 AM By: Lenda KelpStone III, Nyeshia Mysliwiec PA-C Entered By: Lenda KelpStone III, Daylah Sayavong on 05/08/2022 08:13:35 [name], [name] ([id number]) -------------------------------------------------------------------------------- Debridement Details Patient Name: [name], [name] Date of Service: 05/08/2022 8:00 AM Medical Record Number: [id number] Patient Account Number: [id number] Date of Birth/Sex: [birth year] (36 y.o. F) Treating RN: Yevonne PaxEpps, Carrie Primary Care Provider: Lynnell ChadIjaola, Onyeje Other Clinician: Referring Provider: Lynnell ChadIjaola, Onyeje Treating Provider/Extender: Rowan BlaseStone, Edrick Whitehorn Weeks in Treatment: 14 Debridement Performed for Wound #1 Right Amputation Site - Below Knee Assessment: Performed By: Physician Nelida MeuseStone, Shalie Schremp E., PA-C Debridement Type: Debridement Level of Consciousness (Pre- Awake and Alert procedure): Pre-procedure Verification/Time Out Yes - 08:18 Taken: Start Time: 08:18 Total Area Debrided (L x W): 2.1 (cm) x 3.2 (cm) = 6.72 (cm) Tissue and other material Viable, Non-Viable, Slough, Subcutaneous, Skin: Dermis , Skin: Epidermis, Biofilm, Slough, Hyper- debrided: granulation Level: Skin/Subcutaneous Tissue Debridement Description: Excisional Instrument: Curette Bleeding: Moderate Hemostasis Achieved: Silver Nitrate End Time: 08:21 Procedural Pain: 0 Post Procedural Pain: 0 Response to Treatment: Procedure was tolerated well Level of  Consciousness (Post- Awake and Alert procedure): Post Debridement Measurements of Total Wound Length: (cm) 2.1 Width: (cm) 3.2 Depth: (cm) 0.1 Volume: (cm) 0.528 Character of Wound/Ulcer Post Debridement: Improved Post Procedure Diagnosis Same as Pre-procedure Electronic Signature(s) Signed: 05/08/2022 8:34:39 AM By: Yevonne PaxEpps, Carrie RN Signed: 05/08/2022 4:15:14 PM By: Lenda KelpStone III, Anterio Scheel PA-C Entered By: Yevonne PaxEpps, Carrie on 05/08/2022 08:20:58 [name], [name] ([id number]) -------------------------------------------------------------------------------- HPI Details Patient Name: [name], [name] Date of Service: 05/08/2022 8:00 AM Medical Record Number: [id number] Patient Account Number: [id number] Date of Birth/Sex: [birth year] (36 y.o. F) Treating RN: Yevonne PaxEpps, Carrie Primary Care Provider: Lynnell ChadIjaola, Onyeje Other Clinician: Referring Provider: Lynnell ChadIjaola, Onyeje Treating Provider/Extender: Rowan BlaseStone, Bexley Laubach Weeks in Treatment: 14 History of Present Illness HPI Description: 01-27-2022 upon evaluation today patient appears to be doing somewhat poorly in regard to her right below-knee amputation. This unfortunately developed a abscess internally following the initial amputation which was January 2023. Subsequently she was getting very close to starting to work on the prosthesis when she had an area that somewhat bulged out ended up being an abscess and the incision and drainage was performed April 22. Subsequently she did have antibiotic beads placed in a wound VAC after this was cleared out. Her most recent reported hemoglobin A1c was 6.4. With that being said the patient tells me that her blood sugars seem to be under pretty good control as best she can tell at this point. She also tells me that the wound VAC seems to be functioning appropriately although upon evaluation the did not appear to be anything tucked into the region of undermining and depth in the roughly 9-12 o'clock location based on what we saw today. This  is also where the antibiotic beads are and I can see those in place. I do believe that she is getting need to have the wound VAC tucked down into this location. Patient does have a history of diabetes mellitus type 2, diabetic neuropathy, and hypertension. This initial amputation occurred as a result of her having sewing needles that actually went into her heel she  did not know about it started having trouble with her heel and ended up with a significant infection which was stated to be necrotizing and she subsequently had to have emergency amputation. 02-13-2022 upon evaluation today patient appears to be doing well with regard to her wound. She has been tolerating the dressing changes without complication. The wound VAC does seem to be doing a great job. Fortunately I do not see any evidence of active infection locally or systemically at this time which is great news. She still has some of the antibiotic beads in the base of the wound. 02-20-2022 upon evaluation today patient appears to be doing well currently. There does not appear to be any evidence of active infection locally nor systemically which is great news and overall I am extremely pleased with where we stand today. I do believe that the wound VAC is doing a good job here and the color of the drainage is improved again were unsure as to whether this may have been the antibiotic beads or if there was something such as Pseudomonas causing the greenish discoloration to the drainage either way she is doing significantly better. 03-06-2022 upon evaluation today patient appears to be doing well with regard to her wound. The only issue I see is that she does have a little bit of a pocket of fluid that collected in the deeper section everything else is actually measuring smaller and looks better. This was a point where I really did expect this was been a turnaround and start doing a lot better. I think we are especially seeing that right  now. 03-20-2022 upon evaluation today patient appears to be doing well with regard to her wound in fact this is actually significantly improved compared to last time I saw her. Fortunately I do not see any evidence of active infection at this time. No fevers, chills, nausea, vomiting, or diarrhea. 03-27-2022 upon evaluation today patient appears to be doing excellent in regard to her wound. This is showing signs of improvement which is great news and overall I am extremely pleased with where we stand currently. I do not see any evidence of active infection locally or systemically which is great news. No fevers, chills, nausea, vomiting, or diarrhea. 04-03-2022 upon evaluation today patient's wound is showing signs of significant improvement. I am still very pleased with where we stand and how things are going. I think that she is really making excellent progress which is great news. I do not see any signs of active infection at this time. 04-10-2022 upon evaluation today patient's wound actually showed signs of good granulation and epithelization at this point. Fortunately I do not see any evidence of active infection at this time which is great news and overall I am extremely pleased with where things stand currently. No fevers, chills, nausea, vomiting, or diarrhea. 04-17-2022 upon evaluation today patient appears to be doing well with regard to her wound she is actually showing signs of significant improvement which is great news. I do believe that she is making good progress with the current wound care measures. Fortunately I do not see any evidence of active infection locally or systemically which is great news. 7/24; right BKA site secondary to an abscess. Per our intake nurse wound VAC came off 2 weeks ago she has been using silver collagen. Nice improvements in wound dimensions. She does not have a prosthesis as of yet 05-01-2022 upon evaluation today patient appears to be doing well with regard  to her wound  in fact this is measuring significantly smaller and is really doing quite well. Fortunately I do not see any evidence of active infection locally or systemically at this time. 05-08-2022 upon evaluation today patient's wound is actually showing signs of excellent improvement. I am actually very pleased with where we stand I think that she is making great progress and I do not see any signs of active infection at this time which is great news. No fevers, chills, nausea, vomiting, or diarrhea. Electronic Signature(s) Signed: 05/08/2022 9:19:32 AM By: Lenda Kelp PA-C Entered By: Lenda Kelp on 05/08/2022 09:19:32 Allison Rota ([id number]) -------------------------------------------------------------------------------- Physical Exam Details Patient Name: Allison Rota Date of Service: 05/08/2022 8:00 AM Medical Record Number: [id number] Patient Account Number: [id number] Date of Birth/Sex: 21-Jun-1986 (35 y.o. F) Treating RN: Yevonne Pax Primary Care Provider: Lynnell Chad Other Clinician: Referring Provider: Lynnell Chad Treating Provider/Extender: Rowan Blase in Treatment: 14 Constitutional Well-nourished and well-hydrated in no acute distress. Respiratory normal breathing without difficulty. Psychiatric this patient is able to make decisions and demonstrates good insight into disease process. Alert and Oriented x 3. pleasant and cooperative. Notes Upon inspection patient's wound bed actually showed signs of good granulation and epithelization at this point. Fortunately I do not see any evidence of active infection which is great and overall I think that the patient is managing quite nicely. There is some hypergranulation I did perform sharp debridement to clear some of this away and then used a silver nitrate for chemical cauterization she tolerated that without complication postdebridement and cauterization this seems to be doing much better. Electronic  Signature(s) Signed: 05/08/2022 9:20:07 AM By: Lenda Kelp PA-C Entered By: Lenda Kelp on 05/08/2022 09:20:07 Allison Rota ([id number]) -------------------------------------------------------------------------------- Physician Orders Details Patient Name: Allison Rota Date of Service: 05/08/2022 8:00 AM Medical Record Number: [id number] Patient Account Number: [id number] Date of Birth/Sex: 1986/03/13 (35 y.o. F) Treating RN: Yevonne Pax Primary Care Provider: Lynnell Chad Other Clinician: Referring Provider: Lynnell Chad Treating Provider/Extender: Rowan Blase in Treatment: 14 Verbal / Phone Orders: No Diagnosis Coding Follow-up Appointments o Return Appointment in 1 week. Bathing/ Shower/ Hygiene o May shower; gently cleanse wound with antibacterial soap, rinse and pat dry prior to dressing wounds Edema Control - Lymphedema / Segmental Compressive Device / Other o Other: - stump shrinker Wound Treatment Wound #1 - Amputation Site - Below Knee Wound Laterality: Right Primary Dressing: Prisma 4.34 (in) (Generic) 3 x Per Week/30 Days Discharge Instructions: Moisten w/normal saline or sterile water; Cover wound as directed. Do not remove from wound bed. Secondary Dressing: ABD Pad 5x9 (in/in) (Generic) 3 x Per Week/30 Days Discharge Instructions: Cover with ABD pad Secured With: Medipore Tape - 3M Medipore H Soft Cloth Surgical Tape, 2x2 (in/yd) 3 x Per Week/30 Days Electronic Signature(s) Signed: 05/08/2022 8:36:30 AM By: Yevonne Pax RN Signed: 05/08/2022 4:15:14 PM By: Lenda Kelp PA-C Previous Signature: 05/08/2022 8:34:39 AM Version By: Yevonne Pax RN Entered By: Yevonne Pax on 05/08/2022 08:36:29 Allison Rota ([id number]) -------------------------------------------------------------------------------- Problem List Details Patient Name: Allison Rota Date of Service: 05/08/2022 8:00 AM Medical Record Number: [id number] Patient Account Number:  [id number] Date of Birth/Sex: 1985-12-06 (35 y.o. F) Treating RN: Yevonne Pax Primary Care Provider: Lynnell Chad Other Clinician: Referring Provider: Lynnell Chad Treating Provider/Extender: Rowan Blase in Treatment: 14 Active Problems ICD-10 Encounter Code Description Active Date MDM Diagnosis E11.622 Type 2 diabetes mellitus with other skin ulcer 01/27/2022 No Yes T81.31XA Disruption of external operation (surgical) wound, not elsewhere  01/27/2022 No Yes classified, initial encounter L97.812 Non-pressure chronic ulcer of other part of right lower leg with fat layer 01/27/2022 No Yes exposed E11.43 Type 2 diabetes mellitus with diabetic autonomic (poly)neuropathy 01/27/2022 No Yes I10 Essential (primary) hypertension 01/27/2022 No Yes Inactive Problems Resolved Problems Electronic Signature(s) Signed: 05/08/2022 8:13:30 AM By: Lenda Kelp PA-C Entered By: Lenda Kelp on 05/08/2022 08:13:30 Allison Rota ([id number]) -------------------------------------------------------------------------------- Progress Note Details Patient Name: Allison Rota Date of Service: 05/08/2022 8:00 AM Medical Record Number: [id number] Patient Account Number: [id number] Date of Birth/Sex: [birth year] (35 y.o. F) Treating RN: Yevonne Pax Primary Care Provider: Lynnell Chad Other Clinician: Referring Provider: Lynnell Chad Treating Provider/Extender: Rowan Blase in Treatment: 14 Subjective Chief Complaint Information obtained from Patient Right BKA Amputation Site Disruption History of Present Illness (HPI) 01-27-2022 upon evaluation today patient appears to be doing somewhat poorly in regard to her right below-knee amputation. This unfortunately developed a abscess internally following the initial amputation which was January 2023. Subsequently she was getting very close to starting to work on the prosthesis when she had an area that somewhat bulged out ended up being an  abscess and the incision and drainage was performed April 22. Subsequently she did have antibiotic beads placed in a wound VAC after this was cleared out. Her most recent reported hemoglobin A1c was 6.4. With that being said the patient tells me that her blood sugars seem to be under pretty good control as best she can tell at this point. She also tells me that the wound VAC seems to be functioning appropriately although upon evaluation the did not appear to be anything tucked into the region of undermining and depth in the roughly 9-12 o'clock location based on what we saw today. This is also where the antibiotic beads are and I can see those in place. I do believe that she is getting need to have the wound VAC tucked down into this location. Patient does have a history of diabetes mellitus type 2, diabetic neuropathy, and hypertension. This initial amputation occurred as a result of her having sewing needles that actually went into her heel she did not know about it started having trouble with her heel and ended up with a significant infection which was stated to be necrotizing and she subsequently had to have emergency amputation. 02-13-2022 upon evaluation today patient appears to be doing well with regard to her wound. She has been tolerating the dressing changes without complication. The wound VAC does seem to be doing a great job. Fortunately I do not see any evidence of active infection locally or systemically at this time which is great news. She still has some of the antibiotic beads in the base of the wound. 02-20-2022 upon evaluation today patient appears to be doing well currently. There does not appear to be any evidence of active infection locally nor systemically which is great news and overall I am extremely pleased with where we stand today. I do believe that the wound VAC is doing a good job here and the color of the drainage is improved again were unsure as to whether this may have  been the antibiotic beads or if there was something such as Pseudomonas causing the greenish discoloration to the drainage either way she is doing significantly better. 03-06-2022 upon evaluation today patient appears to be doing well with regard to her wound. The only issue I see is that she does have a little bit of a pocket of fluid that collected  in the deeper section everything else is actually measuring smaller and looks better. This was a point where I really did expect this was been a turnaround and start doing a lot better. I think we are especially seeing that right now. 03-20-2022 upon evaluation today patient appears to be doing well with regard to her wound in fact this is actually significantly improved compared to last time I saw her. Fortunately I do not see any evidence of active infection at this time. No fevers, chills, nausea, vomiting, or diarrhea. 03-27-2022 upon evaluation today patient appears to be doing excellent in regard to her wound. This is showing signs of improvement which is great news and overall I am extremely pleased with where we stand currently. I do not see any evidence of active infection locally or systemically which is great news. No fevers, chills, nausea, vomiting, or diarrhea. 04-03-2022 upon evaluation today patient's wound is showing signs of significant improvement. I am still very pleased with where we stand and how things are going. I think that she is really making excellent progress which is great news. I do not see any signs of active infection at this time. 04-10-2022 upon evaluation today patient's wound actually showed signs of good granulation and epithelization at this point. Fortunately I do not see any evidence of active infection at this time which is great news and overall I am extremely pleased with where things stand currently. No fevers, chills, nausea, vomiting, or diarrhea. 04-17-2022 upon evaluation today patient appears to be doing well  with regard to her wound she is actually showing signs of significant improvement which is great news. I do believe that she is making good progress with the current wound care measures. Fortunately I do not see any evidence of active infection locally or systemically which is great news. 7/24; right BKA site secondary to an abscess. Per our intake nurse wound VAC came off 2 weeks ago she has been using silver collagen. Nice improvements in wound dimensions. She does not have a prosthesis as of yet 05-01-2022 upon evaluation today patient appears to be doing well with regard to her wound in fact this is measuring significantly smaller and is really doing quite well. Fortunately I do not see any evidence of active infection locally or systemically at this time. 05-08-2022 upon evaluation today patient's wound is actually showing signs of excellent improvement. I am actually very pleased with where we stand I think that she is making great progress and I do not see any signs of active infection at this time which is great news. No fevers, chills, nausea, vomiting, or diarrhea. CHANTAVIA, [name] ([id number]) Objective Constitutional Well-nourished and well-hydrated in no acute distress. Vitals Time Taken: 8:06 AM, Height: 67 in, Weight: 219 lbs, BMI: 34.3, Temperature: 98.3 F, Pulse: 108 bpm, Respiratory Rate: 18 breaths/min, Blood Pressure: 145/92 mmHg. Respiratory normal breathing without difficulty. Psychiatric this patient is able to make decisions and demonstrates good insight into disease process. Alert and Oriented x 3. pleasant and cooperative. General Notes: Upon inspection patient's wound bed actually showed signs of good granulation and epithelization at this point. Fortunately I do not see any evidence of active infection which is great and overall I think that the patient is managing quite nicely. There is some hypergranulation I did perform sharp debridement to clear some of this away and  then used a silver nitrate for chemical cauterization she tolerated that without complication postdebridement and cauterization this seems to be doing much better. Integumentary (  Hair, Skin) Wound #1 status is Open. Original cause of wound was Gradually Appeared. The date acquired was: 01/14/2022. The wound has been in treatment 14 weeks. The wound is located on the Right Amputation Site - Below Knee. The wound measures 2.1cm length x 3.2cm width x 0.1cm depth; 5.278cm^2 area and 0.528cm^3 volume. There is Fat Layer (Subcutaneous Tissue) exposed. There is no tunneling or undermining noted. There is a medium amount of serosanguineous drainage noted. The wound margin is flat and intact. There is large (67-100%) red granulation within the wound bed. There is no necrotic tissue within the wound bed. Assessment Active Problems ICD-10 Type 2 diabetes mellitus with other skin ulcer Disruption of external operation (surgical) wound, not elsewhere classified, initial encounter Non-pressure chronic ulcer of other part of right lower leg with fat layer exposed Type 2 diabetes mellitus with diabetic autonomic (poly)neuropathy Essential (primary) hypertension Procedures Wound #1 Pre-procedure diagnosis of Wound #1 is an Abscess located on the Right Amputation Site - Below Knee . There was a Excisional Skin/Subcutaneous Tissue Debridement with a total area of 6.72 sq cm performed by Nelida Meuse., PA-C. With the following instrument(s): Curette to remove Viable and Non-Viable tissue/material. Material removed includes Subcutaneous Tissue, Slough, Skin: Dermis, Skin: Epidermis, Biofilm, and Hyper-granulation. No specimens were taken. A time out was conducted at 08:18, prior to the start of the procedure. A Moderate amount of bleeding was controlled with Silver Nitrate. The procedure was tolerated well with a pain level of 0 throughout and a pain level of 0 following the procedure. Post Debridement  Measurements: 2.1cm length x 3.2cm width x 0.1cm depth; 0.528cm^3 volume. Character of Wound/Ulcer Post Debridement is improved. Post procedure Diagnosis Wound #1: Same as Pre-Procedure Plan Follow-up Appointments: Return Appointment in 1 week. Bathing/ Shower/ Hygiene: May shower; gently cleanse wound with antibacterial soap, rinse and pat dry prior to dressing wounds Edema Control - Lymphedema / Segmental Compressive Device / OtherVALLORIE, [name] ([id number]) Other: - stump shrinker WOUND #1: - Amputation Site - Below Knee Wound Laterality: Right Primary Dressing: Prisma 4.34 (in) (Generic) 3 x Per Week/30 Days Discharge Instructions: Moisten w/normal saline or sterile water; Cover wound as directed. Do not remove from wound bed. Secondary Dressing: ABD Pad 5x9 (in/in) (Generic) 3 x Per Week/30 Days Discharge Instructions: Cover with ABD pad Secured With: Medipore Tape - 51M Medipore H Soft Cloth Surgical Tape, 2x2 (in/yd) 3 x Per Week/30 Days 1. I am going to recommend that we go ahead and continue with the wound care measures as before and the patient is in agreement the plan. This includes the use of the silver collagen which I think is still doing an awesome job. 2. I am also can recommend that we have the patient continue with the ABD pad to cover. 3. I am also can recommend that we have the patient continue with the use of the roll gauze to hold in place and that she is using the shrinker to keep swelling down that seems to be doing quite well. We will see patient back for reevaluation in 1 week here in the clinic. If anything worsens or changes patient will contact our office for additional recommendations. Electronic Signature(s) Signed: 05/08/2022 9:20:50 AM By: Lenda Kelp PA-C Entered By: Lenda Kelp on 05/08/2022 09:20:50 Allison Rota ([id number]) -------------------------------------------------------------------------------- SuperBill Details Patient Name: Allison Rota Date of Service: 05/08/2022 Medical Record Number: [id number] Patient Account Number: [id number] Date of Birth/Sex: Nov 11, 1985 (35 y.o. F) Treating RN: Yevonne Pax  Primary Care Provider: Lynnell Chad Other Clinician: Referring Provider: Lynnell Chad Treating Provider/Extender: Rowan Blase in Treatment: 14 Diagnosis Coding ICD-10 Codes Code Description E11.622 Type 2 diabetes mellitus with other skin ulcer T81.31XA Disruption of external operation (surgical) wound, not elsewhere classified, initial encounter L97.812 Non-pressure chronic ulcer of other part of right lower leg with fat layer exposed E11.43 Type 2 diabetes mellitus with diabetic autonomic (poly)neuropathy I10 Essential (primary) hypertension Facility Procedures CPT4 Code: 16109604 Description: 11042 - DEB SUBQ TISSUE 20 SQ CM/< Modifier: Quantity: 1 CPT4 Code: Description: ICD-10 Diagnosis Description L97.812 Non-pressure chronic ulcer of other part of right lower leg with fat laye Modifier: r exposed Quantity: Physician Procedures CPT4 Code: 5409811 Description: 11042 - WC PHYS SUBQ TISS 20 SQ CM Modifier: Quantity: 1 CPT4 Code: Description: ICD-10 Diagnosis Description L97.812 Non-pressure chronic ulcer of other part of right lower leg with fat laye Modifier: r exposed Quantity: Electronic Signature(s) Signed: 05/08/2022 9:33:41 AM By: Lenda Kelp PA-C Entered By: Lenda Kelp on 05/08/2022 09:33:41

## 2022-05-08 NOTE — Progress Notes (Signed)
[name], [name] ([id number]) Visit Report for 05/08/2022 Arrival Information Details Patient Name: [name], [name] Date of Service: 05/08/2022 8:00 AM Medical Record Number: [id number] Patient Account Number: [id number] Date of Birth/Sex: 1986-05-24 (36 y.o. F) Treating RN: Carlene Coria Primary Care Huong Luthi: Jac Canavan Other Clinician: Referring Stacia Feazell: Jac Canavan Treating Drezden Seitzinger/Extender: Skipper Cliche in Treatment: 14 Visit Information History Since Last Visit All ordered tests and consults were completed: No Patient Arrived: Wheel Chair Added or deleted any medications: No Arrival Time: 08:01 Any new allergies or adverse reactions: No Accompanied By: self Had a fall or experienced change in No Transfer Assistance: None activities of daily living that may affect Patient Identification Verified: Yes risk of falls: Secondary Verification Process Completed: Yes Signs or symptoms of abuse/neglect since last visito No Patient Requires Transmission-Based Precautions: No Hospitalized since last visit: No Patient Has Alerts: Yes Implantable device outside of the clinic excluding No cellular tissue based products placed in the center since last visit: Has Dressing in Place as Prescribed: Yes Pain Present Now: No Electronic Signature(s) Signed: 05/08/2022 8:34:39 AM By: Carlene Coria RN Entered By: Carlene Coria on 05/08/2022 08:06:19 [name] ([id number]) -------------------------------------------------------------------------------- Clinic Level of Care Assessment Details Patient Name: [name] Date of Service: 05/08/2022 8:00 AM Medical Record Number: [id number] Patient Account Number: [id number] Date of Birth/Sex: Nov 29, 1985 (35 y.o. F) Treating RN: Carlene Coria Primary Care Diamonds Lippard: Jac Canavan Other Clinician: Referring Chaye Misch: Jac Canavan Treating Cordie Beazley/Extender: Skipper Cliche in Treatment: 14 Clinic Level of Care Assessment Items TOOL 1  Quantity Score _0  - Use when EandM and Procedure is performed on INITIAL visit 0 ASSESSMENTS - Nursing Assessment / Reassessment _1  - General Physical Exam (combine w/ comprehensive assessment (listed just below) when performed on new 0 pt. evals) _2  - 0 Comprehensive Assessment (HX, ROS, Risk Assessments, Wounds Hx, etc.) ASSESSMENTS - Wound and Skin Assessment / Reassessment _3  - Dermatologic / Skin Assessment (not related to wound area) 0 ASSESSMENTS - Ostomy and/or Continence Assessment and Care _4  - Incontinence Assessment and Management 0 _5  - 0 Ostomy Care Assessment and Management (repouching, etc.) PROCESS - Coordination of Care _6  - Simple Patient / Family Education for ongoing care 0 _7  - 0 Complex (extensive) Patient / Family Education for ongoing care _8  - 0 Staff obtains Programmer, systems, Records, Test Results / Process Orders _9  - 0 Staff telephones HHA, Nursing Homes / Clarify orders / etc _10  - 0 Routine Transfer to another Facility (non-emergent condition) _11  - 0 Routine Hospital Admission (non-emergent condition) _12  - 0 New Admissions / Biomedical engineer / Ordering NPWT, Apligraf, etc. _13  - 0 Emergency Hospital Admission (emergent condition) PROCESS - Special Needs _14  - Pediatric / Minor Patient Management 0 _15  - 0 Isolation Patient Management _16  - 0 Hearing / Language / Visual special needs _17  - 0 Assessment of Community assistance (transportation, D/C planning, etc.) _18  - 0 Additional assistance / Altered mentation _19  - 0 Support Surface(s) Assessment (bed, cushion, seat, etc.) INTERVENTIONS - Miscellaneous _20  - External ear exam 0 _21  - 0 Patient Transfer (multiple staff / Civil Service fast streamer / Similar devices) _22  - 0 Simple Staple / Suture removal (25 or less) _23  - 0 Complex Staple / Suture removal (26 or more) _24  - 0 Hypo/Hyperglycemic Management (do not check if billed separately) _25  - 0 Ankle / Brachial Index (ABI) - do not check if billed  separately Has the patient been seen at the hospital within the last three years: Yes Total Score: 0 Level Of Care: ____  [name], [name] ([id number]) Electronic Signature(s) Signed: 05/08/2022 8:34:39 AM By: Carlene Coria RN Entered By: Carlene Coria on 05/08/2022 08:22:02 [name] ([id number]) -------------------------------------------------------------------------------- Encounter Discharge Information Details Patient Name: [name] Date of Service: 05/08/2022 8:00 AM Medical Record Number: [id number] Patient Account Number: [id number] Date of Birth/Sex: 01-31-1986 (35 y.o. F) Treating RN: Carlene Coria Primary Care Cicilia Clinger: Jac Canavan Other Clinician: Referring Edoardo Laforte: Jac Canavan Treating Darnell Stimson/Extender: Skipper Cliche in Treatment: 14 Encounter Discharge Information Items Post Procedure Vitals Discharge Condition: Stable Temperature (F): 98.3 Ambulatory Status: Wheelchair Pulse (bpm): 108 Discharge Destination: Home Respiratory Rate (breaths/min): 18 Transportation: Private Auto Blood Pressure (mmHg): 145/92 Accompanied By: self Schedule Follow-up Appointment: Yes Clinical Summary of Care: Electronic Signature(s) Signed: 05/08/2022 8:34:39 AM By: Carlene Coria RN Entered By: Carlene Coria on 05/08/2022 08:23:17 [name] ([id number]) -------------------------------------------------------------------------------- Lower Extremity Assessment Details Patient Name: [name] Date of Service: 05/08/2022 8:00 AM Medical Record Number: [id number] Patient Account Number: [id number] Date of Birth/Sex: [birth year] (35 y.o. F) Treating RN: Carlene Coria Primary Care Trenisha Lafavor: Jac Canavan Other Clinician: Referring Miakoda Mcmillion: Jac Canavan Treating Elward Nocera/Extender: Skipper Cliche in Treatment: 14 Electronic Signature(s) Signed: 05/08/2022 8:34:39 AM By: Carlene Coria RN Entered By: Carlene Coria on 05/08/2022 08:13:00 [name]  ([id number]) -------------------------------------------------------------------------------- Multi Wound Chart Details Patient Name: [name] Date of Service: 05/08/2022 8:00 AM Medical Record Number: [id number] Patient Account Number: [id number] Date of Birth/Sex: 10/27/1985 (35 y.o. F) Treating RN: Carlene Coria Primary Care Anaid Haney: Jac Canavan Other Clinician: Referring Gerome Kokesh: Jac Canavan Treating Alonna Bartling/Extender: Skipper Cliche in Treatment: 14 Vital Signs Height(in): 67 Pulse(bpm): 108 Weight(lbs): 219 Blood Pressure(mmHg): 145/92 Body Mass Index(BMI): 34.3 Temperature(F): 98.3 Respiratory Rate(breaths/min): 18 Photos: [N/A:N/A] Wound Location: Right Amputation Site - Below Knee N/A N/A Wounding Event: Gradually Appeared N/A N/A Primary Etiology: Abscess N/A N/A Comorbid History: Hypertension, Type II Diabetes N/A N/A Date Acquired: 01/14/2022 N/A N/A Weeks of Treatment: 14 N/A N/A Wound Status: Open N/A N/A Wound Recurrence: No N/A N/A Measurements L x W x D (cm) 2.1x3.2x0.1 N/A N/A Area (cm) : 5.278 N/A N/A Volume (cm) : 0.528 N/A N/A % Reduction in Area: 80.50% N/A N/A % Reduction in Volume: 99.50% N/A N/A Classification: Full Thickness With Exposed N/A N/A Support Structures Exudate Amount: Medium N/A N/A Exudate Type: Serosanguineous N/A N/A Exudate Color: red, brown N/A N/A Wound Margin: Flat and Intact N/A N/A Granulation Amount: Large (67-100%) N/A N/A Granulation Quality: Red N/A N/A Necrotic Amount: None Present (0%) N/A N/A Exposed Structures: Fat Layer (Subcutaneous Tissue): N/A N/A Yes Fascia: No Tendon: No Muscle: No Joint: No Bone: No Epithelialization: Small (1-33%) N/A N/A Treatment Notes Electronic Signature(s) Signed: 05/08/2022 8:34:39 AM By: Carlene Coria RN Entered By: Carlene Coria on 05/08/2022 08:13:19 [name]  ([id number]) -------------------------------------------------------------------------------- Multi-Disciplinary Care Plan Details Patient Name: [name] Date of Service: 05/08/2022 8:00 AM Medical Record Number: [id number] Patient Account Number: [id number] Date of Birth/Sex: [birth year] (35 y.o. F) Treating RN: Carlene Coria Primary Care Hilari Wethington: Jac Canavan Other Clinician: Referring Marv Alfrey: Jac Canavan Treating Mckynlee Luse/Extender: Skipper Cliche in Treatment: 14 Active Inactive Wound/Skin Impairment Nursing Diagnoses: Knowledge deficit related to ulceration/compromised skin integrity Goals: Patient/caregiver will verbalize understanding of skin care regimen Date Initiated: 01/27/2022 Target Resolution Date: 05/25/2022 Goal Status: Active Ulcer/skin breakdown will have a volume reduction of 30% by week 4 Date Initiated: 01/27/2022 Date Inactivated: 04/24/2022 Target Resolution Date: 03/29/2022 Goal Status: Met Ulcer/skin breakdown will have a volume reduction of 50% by week 8 Date Initiated: 01/27/2022 Date Inactivated: 04/24/2022  Target Resolution Date: 04/28/2022 Goal Status: Met Ulcer/skin breakdown will have a volume reduction of 80% by week 12 Date Initiated: 01/27/2022 Target Resolution Date: 05/29/2022 Goal Status: Active Ulcer/skin breakdown will heal within 14 weeks Date Initiated: 01/27/2022 Target Resolution Date: 06/29/2022 Goal Status: Active Interventions: Assess patient/caregiver ability to obtain necessary supplies Assess patient/caregiver ability to perform ulcer/skin care regimen upon admission and as needed Assess ulceration(s) every visit Notes: Electronic Signature(s) Signed: 05/08/2022 8:34:39 AM By: Carlene Coria RN Entered By: Carlene Coria on 05/08/2022 08:13:11 [name] ([id number]) -------------------------------------------------------------------------------- Pain Assessment Details Patient Name: [name] Date of Service:  05/08/2022 8:00 AM Medical Record Number: [id number] Patient Account Number: [id number] Date of Birth/Sex: 1986-08-20 (35 y.o. F) Treating RN: Carlene Coria Primary Care Jaunice Mirza: Jac Canavan Other Clinician: Referring Christianjames Soule: Jac Canavan Treating Mirna Sutcliffe/Extender: Skipper Cliche in Treatment: 14 Active Problems Location of Pain Severity and Description of Pain Patient Has Paino No Site Locations Pain Management and Medication Current Pain Management: Electronic Signature(s) Signed: 05/08/2022 8:34:39 AM By: Carlene Coria RN Entered By: Carlene Coria on 05/08/2022 08:06:57 [name] ([id number]) -------------------------------------------------------------------------------- Patient/Caregiver Education Details Patient Name: [name] Date of Service: 05/08/2022 8:00 AM Medical Record Number: [id number] Patient Account Number: [id number] Date of Birth/Gender: 10/21/1985 (35 y.o. F) Treating RN: Carlene Coria Primary Care Physician: Jac Canavan Other Clinician: Referring Physician: Jac Canavan Treating Physician/Extender: Skipper Cliche in Treatment: 14 Education Assessment Education Provided To: Patient Education Topics Provided Wound/Skin Impairment: Methods: Explain/Verbal Responses: State content correctly Electronic Signature(s) Signed: 05/08/2022 8:34:39 AM By: Carlene Coria RN Entered By: Carlene Coria on 05/08/2022 08:22:19 [name] ([id number]) -------------------------------------------------------------------------------- Wound Assessment Details Patient Name: [name] Date of Service: 05/08/2022 8:00 AM Medical Record Number: [id number] Patient Account Number: [id number] Date of Birth/Sex: Jul 01, 1986 (35 y.o. F) Treating RN: Carlene Coria Primary Care Sebastain Fishbaugh: Jac Canavan Other Clinician: Referring Kyrese Gartman: Jac Canavan Treating Carloyn Lahue/Extender: Skipper Cliche in Treatment: 14 Wound Status Wound Number: 1 Primary  Etiology: Abscess Wound Location: Right Amputation Site - Below Knee Wound Status: Open Wounding Event: Gradually Appeared Comorbid History: Hypertension, Type II Diabetes Date Acquired: 01/14/2022 Weeks Of Treatment: 14 Clustered Wound: No Photos Wound Measurements Length: (cm) 2.1 Width: (cm) 3.2 Depth: (cm) 0.1 Area: (cm) 5.278 Volume: (cm) 0.528 % Reduction in Area: 80.5% % Reduction in Volume: 99.5% Epithelialization: Small (1-33%) Tunneling: No Undermining: No Wound Description Classification: Full Thickness With Exposed Support Structures Wound Margin: Flat and Intact Exudate Amount: Medium Exudate Type: Serosanguineous Exudate Color: red, brown Foul Odor After Cleansing: No Slough/Fibrino No Wound Bed Granulation Amount: Large (67-100%) Exposed Structure Granulation Quality: Red Fascia Exposed: No Necrotic Amount: None Present (0%) Fat Layer (Subcutaneous Tissue) Exposed: Yes Tendon Exposed: No Muscle Exposed: No Joint Exposed: No Bone Exposed: No Treatment Notes Wound #1 (Amputation Site - Below Knee) Wound Laterality: Right Cleanser Peri-Wound Care Topical [name], [name] ([id number]) Primary Dressing Prisma 4.34 (in) Discharge Instruction: Moisten w/normal saline or sterile water; Cover wound as directed. Do not remove from wound bed. Secondary Dressing ABD Pad 5x9 (in/in) Discharge Instruction: Cover with ABD pad Secured With [name] Soft Cloth Surgical Tape, 2x2 (in/yd) Compression Wrap Compression Stockings Add-Ons Electronic Signature(s) Signed: 05/08/2022 8:34:39 AM By: Carlene Coria RN Entered By: Carlene Coria on 05/08/2022 08:12:47 [name] ([id number]) -------------------------------------------------------------------------------- Vitals Details Patient Name: [name] Date of Service: 05/08/2022 8:00 AM Medical Record Number: [id number] Patient Account Number: [id number] Date of Birth/Sex: [birth year] (36  y.o. F) Treating RN: Carlene Coria Primary Care  Talen Poser: Jac Canavan Other Clinician: Referring Shaye Lagace: Jac Canavan Treating Hennessy Bartel/Extender: Skipper Cliche in Treatment: 14 Vital Signs Time Taken: 08:06 Temperature (F): 98.3 Height (in): 67 Pulse (bpm): 108 Weight (lbs): 219 Respiratory Rate (breaths/min): 18 Body Mass Index (BMI): 34.3 Blood Pressure (mmHg): 145/92 Reference Range: 80 - 120 mg / dl Electronic Signature(s) Signed: 05/08/2022 8:34:39 AM By: Carlene Coria RN Entered By: Carlene Coria on 05/08/2022 08:06:45

## 2022-05-15 ENCOUNTER — Encounter: Payer: Medicaid Other | Admitting: Physician Assistant

## 2022-05-15 DIAGNOSIS — T8781 Dehiscence of amputation stump: Secondary | ICD-10-CM | POA: Diagnosis not present

## 2022-05-19 NOTE — Progress Notes (Addendum)
[name], [name] ([id number]) Visit Report for 05/15/2022 Chief Complaint Document Details Patient Name: [name], [name] Date of Service: 05/15/2022 8:00 AM Medical Record Number: [id number] Patient Account Number: [id number] Date of Birth/Sex: [birth year] (36 y.o. F) Treating RN: Yevonne PaxEpps, Carrie Primary Care Provider: Lynnell ChadIjaola, Onyeje Other Clinician: Referring Provider: Lynnell ChadIjaola, Onyeje Treating Provider/Extender: Rowan BlaseStone, Sulo Janczak Weeks in Treatment: 15 Information Obtained from: Patient Chief Complaint Right BKA Amputation Site Disruption Electronic Signature(s) Signed: 05/15/2022 8:12:59 AM By: Lenda KelpStone III, Trenia Tennyson PA-C Entered By: Lenda KelpStone III, Kathlean Cinco on 05/15/2022 08:12:59 [name], [name] ([id number]) -------------------------------------------------------------------------------- Debridement Details Patient Name: [name], [name] Date of Service: 05/15/2022 8:00 AM Medical Record Number: [id number] Patient Account Number: [id number] Date of Birth/Sex: [birth year] (35 y.o. F) Treating RN: Yevonne PaxEpps, Carrie Primary Care Provider: Lynnell ChadIjaola, Onyeje Other Clinician: Referring Provider: Lynnell ChadIjaola, Onyeje Treating Provider/Extender: Rowan BlaseStone, Amayra Kiedrowski Weeks in Treatment: 15 Debridement Performed for Wound #1 Right Amputation Site - Below Knee Assessment: Performed By: Physician Nelida MeuseStone, Abdel Effinger E., PA-C Debridement Type: Debridement Level of Consciousness (Pre- Awake and Alert procedure): Pre-procedure Verification/Time Out Yes - 08:15 Taken: Start Time: 08:15 Pain Control: Lidocaine 4% Topical Solution Total Area Debrided (L x W): 1.7 (cm) x 3 (cm) = 5.1 (cm) Tissue and other material Viable, Non-Viable, Slough, Subcutaneous, Skin: Dermis , Skin: Epidermis, Slough debrided: Level: Skin/Subcutaneous Tissue Debridement Description: Excisional Instrument: Curette Bleeding: Moderate Hemostasis Achieved: Pressure End Time: 08:18 Procedural Pain: 0 Post Procedural Pain: 0 Response to Treatment: Procedure was tolerated  well Level of Consciousness (Post- Awake and Alert procedure): Post Debridement Measurements of Total Wound Length: (cm) 1.7 Width: (cm) 3 Depth: (cm) 0.1 Volume: (cm) 0.401 Character of Wound/Ulcer Post Debridement: Improved Post Procedure Diagnosis Same as Pre-procedure Electronic Signature(s) Signed: 05/15/2022 10:58:33 AM By: Yevonne PaxEpps, Carrie RN Signed: 05/15/2022 4:45:05 PM By: Lenda KelpStone III, Olukemi Panchal PA-C Entered By: Yevonne PaxEpps, Carrie on 05/15/2022 08:18:50 [name], [name] ([id number]) -------------------------------------------------------------------------------- HPI Details Patient Name: [name], [name] Date of Service: 05/15/2022 8:00 AM Medical Record Number: [id number] Patient Account Number: [id number] Date of Birth/Sex: [birth year] (35 y.o. F) Treating RN: Yevonne PaxEpps, Carrie Primary Care Provider: Lynnell ChadIjaola, Onyeje Other Clinician: Referring Provider: Lynnell ChadIjaola, Onyeje Treating Provider/Extender: Rowan BlaseStone, Tyshan Enderle Weeks in Treatment: 15 History of Present Illness HPI Description: 01-27-2022 upon evaluation today patient appears to be doing somewhat poorly in regard to her right below-knee amputation. This unfortunately developed a abscess internally following the initial amputation which was January 2023. Subsequently she was getting very close to starting to work on the prosthesis when she had an area that somewhat bulged out ended up being an abscess and the incision and drainage was performed April 22. Subsequently she did have antibiotic beads placed in a wound VAC after this was cleared out. Her most recent reported hemoglobin A1c was 6.4. With that being said the patient tells me that her blood sugars seem to be under pretty good control as best she can tell at this point. She also tells me that the wound VAC seems to be functioning appropriately although upon evaluation the did not appear to be anything tucked into the region of undermining and depth in the roughly 9-12 o'clock location based on what we  saw today. This is also where the antibiotic beads are and I can see those in place. I do believe that she is getting need to have the wound VAC tucked down into this location. Patient does have a history of diabetes mellitus type 2, diabetic neuropathy, and hypertension. This initial amputation occurred as a result of her having sewing needles that actually went into her  heel she did not know about it started having trouble with her heel and ended up with a significant infection which was stated to be necrotizing and she subsequently had to have emergency amputation. 02-13-2022 upon evaluation today patient appears to be doing well with regard to her wound. She has been tolerating the dressing changes without complication. The wound VAC does seem to be doing a great job. Fortunately I do not see any evidence of active infection locally or systemically at this time which is great news. She still has some of the antibiotic beads in the base of the wound. 02-20-2022 upon evaluation today patient appears to be doing well currently. There does not appear to be any evidence of active infection locally nor systemically which is great news and overall I am extremely pleased with where we stand today. I do believe that the wound VAC is doing a good job here and the color of the drainage is improved again were unsure as to whether this may have been the antibiotic beads or if there was something such as Pseudomonas causing the greenish discoloration to the drainage either way she is doing significantly better. 03-06-2022 upon evaluation today patient appears to be doing well with regard to her wound. The only issue I see is that she does have a little bit of a pocket of fluid that collected in the deeper section everything else is actually measuring smaller and looks better. This was a point where I really did expect this was been a turnaround and start doing a lot better. I think we are especially seeing that  right now. 03-20-2022 upon evaluation today patient appears to be doing well with regard to her wound in fact this is actually significantly improved compared to last time I saw her. Fortunately I do not see any evidence of active infection at this time. No fevers, chills, nausea, vomiting, or diarrhea. 03-27-2022 upon evaluation today patient appears to be doing excellent in regard to her wound. This is showing signs of improvement which is great news and overall I am extremely pleased with where we stand currently. I do not see any evidence of active infection locally or systemically which is great news. No fevers, chills, nausea, vomiting, or diarrhea. 04-03-2022 upon evaluation today patient's wound is showing signs of significant improvement. I am still very pleased with where we stand and how things are going. I think that she is really making excellent progress which is great news. I do not see any signs of active infection at this time. 04-10-2022 upon evaluation today patient's wound actually showed signs of good granulation and epithelization at this point. Fortunately I do not see any evidence of active infection at this time which is great news and overall I am extremely pleased with where things stand currently. No fevers, chills, nausea, vomiting, or diarrhea. 04-17-2022 upon evaluation today patient appears to be doing well with regard to her wound she is actually showing signs of significant improvement which is great news. I do believe that she is making good progress with the current wound care measures. Fortunately I do not see any evidence of active infection locally or systemically which is great news. 7/24; right BKA site secondary to an abscess. Per our intake nurse wound VAC came off 2 weeks ago she has been using silver collagen. Nice improvements in wound dimensions. She does not have a prosthesis as of yet 05-01-2022 upon evaluation today patient appears to be doing well with  regard to  her wound in fact this is measuring significantly smaller and is really doing quite well. Fortunately I do not see any evidence of active infection locally or systemically at this time. 05-08-2022 upon evaluation today patient's wound is actually showing signs of excellent improvement. I am actually very pleased with where we stand I think that she is making great progress and I do not see any signs of active infection at this time which is great news. No fevers, chills, nausea, vomiting, or diarrhea. 05-15-2022 upon evaluation today patient appears to be doing well currently in regard to her wound. This is actually showing signs of excellent improvement is noted completely to the surface there is just a little bit of biofilm although we are can have to clear this away with sharp debridement as she is saline and gauze treatment is not enough to get this loosened today. Electronic Signature(s) Signed: 05/15/2022 8:39:59 AM By: Rosamaria Lints, Annelie ([id number]) Entered By: Lenda Kelp on 05/15/2022 08:39:58 [name] ([id number]) -------------------------------------------------------------------------------- Physical Exam Details Patient Name: [name] Date of Service: 05/15/2022 8:00 AM Medical Record Number: [id number] Patient Account Number: [id number] Date of Birth/Sex: 1986-01-10 (35 y.o. F) Treating RN: Yevonne Pax Primary Care Provider: Lynnell Chad Other Clinician: Referring Provider: Lynnell Chad Treating Provider/Extender: Rowan Blase in Treatment: 15 Constitutional Well-nourished and well-hydrated in no acute distress. Respiratory normal breathing without difficulty. Psychiatric this patient is able to make decisions and demonstrates good insight into disease process. Alert and Oriented x 3. pleasant and cooperative. Notes Patient's wound did require sharp debridement but fortunately seems to be doing quite well which is great news  of actually very pleased with what I am seeing today. There does not appear to be any evidence of active infection locally or systemically at this time which is great news. Overall I think we are on the right track and I am hopeful she will continue to improve week by week. Electronic Signature(s) Signed: 05/15/2022 8:40:10 AM By: Lenda Kelp PA-C Entered By: Lenda Kelp on 05/15/2022 08:40:10 [name] ([id number]) -------------------------------------------------------------------------------- Physician Orders Details Patient Name: [name] Date of Service: 05/15/2022 8:00 AM Medical Record Number: [id number] Patient Account Number: [id number] Date of Birth/Sex: December 11, 1985 (35 y.o. F) Treating RN: Yevonne Pax Primary Care Provider: Lynnell Chad Other Clinician: Referring Provider: Lynnell Chad Treating Provider/Extender: Rowan Blase in Treatment: 15 Verbal / Phone Orders: No Diagnosis Coding ICD-10 Coding Code Description E11.622 Type 2 diabetes mellitus with other skin ulcer T81.31XA Disruption of external operation (surgical) wound, not elsewhere classified, initial encounter L97.812 Non-pressure chronic ulcer of other part of right lower leg with fat layer exposed E11.43 Type 2 diabetes mellitus with diabetic autonomic (poly)neuropathy I10 Essential (primary) hypertension Follow-up Appointments o Return Appointment in 1 week. Bathing/ Shower/ Hygiene o May shower; gently cleanse wound with antibacterial soap, rinse and pat dry prior to dressing wounds Anesthetic (Use 'Patient Medications' Section for Anesthetic Order Entry) o Lidocaine applied to wound bed Edema Control - Lymphedema / Segmental Compressive Device / Other o Other: - stump shrinker Wound Treatment Wound #1 - Amputation Site - Below Knee Wound Laterality: Right Primary Dressing: Prisma 4.34 (in) (Generic) 3 x Per Week/30 Days Discharge Instructions: Moisten w/normal saline or  sterile water; Cover wound as directed. Do not remove from wound bed. Secondary Dressing: ABD Pad 5x9 (in/in) (Generic) 3 x Per Week/30 Days Discharge Instructions: Cover with ABD pad Secured With: Medipore Tape - 75M Medipore H Soft Cloth Surgical Tape,  2x2 (in/yd) 3 x Per Week/30 Days Electronic Signature(s) Signed: 05/15/2022 10:58:33 AM By: Yevonne Pax RN Signed: 05/15/2022 4:45:05 PM By: Lenda Kelp PA-C Entered By: Yevonne Pax on 05/15/2022 08:19:19 [name] ([id number]) -------------------------------------------------------------------------------- Problem List Details Patient Name: [name] Date of Service: 05/15/2022 8:00 AM Medical Record Number: [id number] Patient Account Number: [id number] Date of Birth/Sex: 06-26-1986 (35 y.o. F) Treating RN: Yevonne Pax Primary Care Provider: Lynnell Chad Other Clinician: Referring Provider: Lynnell Chad Treating Provider/Extender: Rowan Blase in Treatment: 15 Active Problems ICD-10 Encounter Code Description Active Date MDM Diagnosis E11.622 Type 2 diabetes mellitus with other skin ulcer 01/27/2022 No Yes T81.31XA Disruption of external operation (surgical) wound, not elsewhere 01/27/2022 No Yes classified, initial encounter L97.812 Non-pressure chronic ulcer of other part of right lower leg with fat layer 01/27/2022 No Yes exposed E11.43 Type 2 diabetes mellitus with diabetic autonomic (poly)neuropathy 01/27/2022 No Yes I10 Essential (primary) hypertension 01/27/2022 No Yes Inactive Problems Resolved Problems Electronic Signature(s) Signed: 05/15/2022 8:12:56 AM By: Lenda Kelp PA-C Entered By: Lenda Kelp on 05/15/2022 08:12:55 [name] ([id number]) -------------------------------------------------------------------------------- Progress Note Details Patient Name: [name] Date of Service: 05/15/2022 8:00 AM Medical Record Number: [id number] Patient Account Number: [id number] Date of  Birth/Sex: Mar 30, 1986 (35 y.o. F) Treating RN: Yevonne Pax Primary Care Provider: Lynnell Chad Other Clinician: Referring Provider: Lynnell Chad Treating Provider/Extender: Rowan Blase in Treatment: 15 Subjective Chief Complaint Information obtained from Patient Right BKA Amputation Site Disruption History of Present Illness (HPI) 01-27-2022 upon evaluation today patient appears to be doing somewhat poorly in regard to her right below-knee amputation. This unfortunately developed a abscess internally following the initial amputation which was January 2023. Subsequently she was getting very close to starting to work on the prosthesis when she had an area that somewhat bulged out ended up being an abscess and the incision and drainage was performed April 22. Subsequently she did have antibiotic beads placed in a wound VAC after this was cleared out. Her most recent reported hemoglobin A1c was 6.4. With that being said the patient tells me that her blood sugars seem to be under pretty good control as best she can tell at this point. She also tells me that the wound VAC seems to be functioning appropriately although upon evaluation the did not appear to be anything tucked into the region of undermining and depth in the roughly 9-12 o'clock location based on what we saw today. This is also where the antibiotic beads are and I can see those in place. I do believe that she is getting need to have the wound VAC tucked down into this location. Patient does have a history of diabetes mellitus type 2, diabetic neuropathy, and hypertension. This initial amputation occurred as a result of her having sewing needles that actually went into her heel she did not know about it started having trouble with her heel and ended up with a significant infection which was stated to be necrotizing and she subsequently had to have emergency amputation. 02-13-2022 upon evaluation today patient appears to be doing  well with regard to her wound. She has been tolerating the dressing changes without complication. The wound VAC does seem to be doing a great job. Fortunately I do not see any evidence of active infection locally or systemically at this time which is great news. She still has some of the antibiotic beads in the base of the wound. 02-20-2022 upon evaluation today patient appears to be doing well currently. There  does not appear to be any evidence of active infection locally nor systemically which is great news and overall I am extremely pleased with where we stand today. I do believe that the wound VAC is doing a good job here and the color of the drainage is improved again were unsure as to whether this may have been the antibiotic beads or if there was something such as Pseudomonas causing the greenish discoloration to the drainage either way she is doing significantly better. 03-06-2022 upon evaluation today patient appears to be doing well with regard to her wound. The only issue I see is that she does have a little bit of a pocket of fluid that collected in the deeper section everything else is actually measuring smaller and looks better. This was a point where I really did expect this was been a turnaround and start doing a lot better. I think we are especially seeing that right now. 03-20-2022 upon evaluation today patient appears to be doing well with regard to her wound in fact this is actually significantly improved compared to last time I saw her. Fortunately I do not see any evidence of active infection at this time. No fevers, chills, nausea, vomiting, or diarrhea. 03-27-2022 upon evaluation today patient appears to be doing excellent in regard to her wound. This is showing signs of improvement which is great news and overall I am extremely pleased with where we stand currently. I do not see any evidence of active infection locally or systemically which is great news. No fevers, chills, nausea,  vomiting, or diarrhea. 04-03-2022 upon evaluation today patient's wound is showing signs of significant improvement. I am still very pleased with where we stand and how things are going. I think that she is really making excellent progress which is great news. I do not see any signs of active infection at this time. 04-10-2022 upon evaluation today patient's wound actually showed signs of good granulation and epithelization at this point. Fortunately I do not see any evidence of active infection at this time which is great news and overall I am extremely pleased with where things stand currently. No fevers, chills, nausea, vomiting, or diarrhea. 04-17-2022 upon evaluation today patient appears to be doing well with regard to her wound she is actually showing signs of significant improvement which is great news. I do believe that she is making good progress with the current wound care measures. Fortunately I do not see any evidence of active infection locally or systemically which is great news. 7/24; right BKA site secondary to an abscess. Per our intake nurse wound VAC came off 2 weeks ago she has been using silver collagen. Nice improvements in wound dimensions. She does not have a prosthesis as of yet 05-01-2022 upon evaluation today patient appears to be doing well with regard to her wound in fact this is measuring significantly smaller and is really doing quite well. Fortunately I do not see any evidence of active infection locally or systemically at this time. 05-08-2022 upon evaluation today patient's wound is actually showing signs of excellent improvement. I am actually very pleased with where we stand I think that she is making great progress and I do not see any signs of active infection at this time which is great news. No fevers, chills, nausea, vomiting, or diarrhea. 05-15-2022 upon evaluation today patient appears to be doing well currently in regard to her wound. This is actually showing  signs of excellent improvement is noted completely to the surface  there is just a little bit of biofilm although we are can have to clear this away with sharp debridement as she is saline and gauze treatment is not enough to get this loosened today. KAYLIANA, CODD ([id number]) Objective Constitutional Well-nourished and well-hydrated in no acute distress. Vitals Time Taken: 8:04 AM, Height: 67 in, Weight: 219 lbs, BMI: 34.3, Temperature: 98.36 F, Pulse: 106 bpm, Respiratory Rate: 18 breaths/min, Blood Pressure: 144/90 mmHg. Respiratory normal breathing without difficulty. Psychiatric this patient is able to make decisions and demonstrates good insight into disease process. Alert and Oriented x 3. pleasant and cooperative. General Notes: Patient's wound did require sharp debridement but fortunately seems to be doing quite well which is great news of actually very pleased with what I am seeing today. There does not appear to be any evidence of active infection locally or systemically at this time which is great news. Overall I think we are on the right track and I am hopeful she will continue to improve week by week. Integumentary (Hair, Skin) Wound #1 status is Open. Original cause of wound was Gradually Appeared. The date acquired was: 01/14/2022. The wound has been in treatment 15 weeks. The wound is located on the Right Amputation Site - Below Knee. The wound measures 1.7cm length x 3cm width x 0.1cm depth; 4.006cm^2 area and 0.401cm^3 volume. There is Fat Layer (Subcutaneous Tissue) exposed. There is no tunneling or undermining noted. There is a medium amount of serosanguineous drainage noted. The wound margin is flat and intact. There is large (67-100%) red granulation within the wound bed. There is no necrotic tissue within the wound bed. Assessment Active Problems ICD-10 Type 2 diabetes mellitus with other skin ulcer Disruption of external operation (surgical) wound, not elsewhere  classified, initial encounter Non-pressure chronic ulcer of other part of right lower leg with fat layer exposed Type 2 diabetes mellitus with diabetic autonomic (poly)neuropathy Essential (primary) hypertension Procedures Wound #1 Pre-procedure diagnosis of Wound #1 is an Abscess located on the Right Amputation Site - Below Knee . There was a Excisional Skin/Subcutaneous Tissue Debridement with a total area of 5.1 sq cm performed by Nelida Meuse., PA-C. With the following instrument(s): Curette to remove Viable and Non-Viable tissue/material. Material removed includes Subcutaneous Tissue, Slough, Skin: Dermis, and Skin: Epidermis after achieving pain control using Lidocaine 4% Topical Solution. No specimens were taken. A time out was conducted at 08:15, prior to the start of the procedure. A Moderate amount of bleeding was controlled with Pressure. The procedure was tolerated well with a pain level of 0 throughout and a pain level of 0 following the procedure. Post Debridement Measurements: 1.7cm length x 3cm width x 0.1cm depth; 0.401cm^3 volume. Character of Wound/Ulcer Post Debridement is improved. Post procedure Diagnosis Wound #1: Same as Pre-Procedure Plan BOWIE, DOIRON ([id number]) Follow-up Appointments: Return Appointment in 1 week. Bathing/ Shower/ Hygiene: May shower; gently cleanse wound with antibacterial soap, rinse and pat dry prior to dressing wounds Anesthetic (Use 'Patient Medications' Section for Anesthetic Order Entry): Lidocaine applied to wound bed Edema Control - Lymphedema / Segmental Compressive Device / Other: Other: - stump shrinker WOUND #1: - Amputation Site - Below Knee Wound Laterality: Right Primary Dressing: Prisma 4.34 (in) (Generic) 3 x Per Week/30 Days Discharge Instructions: Moisten w/normal saline or sterile water; Cover wound as directed. Do not remove from wound bed. Secondary Dressing: ABD Pad 5x9 (in/in) (Generic) 3 x Per Week/30  Days Discharge Instructions: Cover with ABD pad Secured With: Medipore Tape - 30M  Medipore H Soft Cloth Surgical Tape, 2x2 (in/yd) 3 x Per Week/30 Days 1. I would recommend currently that we going continue with the wound care measures as before and the patient is in agreement with plan. This includes the use of the silver collagen followed by ABD pad and roll gauze to secure in place. 2. I would recommend as well that she continue with her shrinker to hold this in place which I think is really doing quite well. We will see patient back for reevaluation in 1 week here in the clinic. If anything worsens or changes patient will contact our office for additional recommendations. Electronic Signature(s) Signed: 05/15/2022 8:40:37 AM By: Lenda Kelp PA-C Entered By: Lenda Kelp on 05/15/2022 08:40:37 [name] ([id number]) -------------------------------------------------------------------------------- SuperBill Details Patient Name: [name] Date of Service: 05/15/2022 Medical Record Number: [id number] Patient Account Number: [id number] Date of Birth/Sex: [birth year] (35 y.o. F) Treating RN: Yevonne Pax Primary Care Provider: Lynnell Chad Other Clinician: Referring Provider: Lynnell Chad Treating Provider/Extender: Rowan Blase in Treatment: 15 Diagnosis Coding ICD-10 Codes Code Description E11.622 Type 2 diabetes mellitus with other skin ulcer T81.31XA Disruption of external operation (surgical) wound, not elsewhere classified, initial encounter L97.812 Non-pressure chronic ulcer of other part of right lower leg with fat layer exposed E11.43 Type 2 diabetes mellitus with diabetic autonomic (poly)neuropathy I10 Essential (primary) hypertension Facility Procedures CPT4 Code: 78295621 Description: 11042 - DEB SUBQ TISSUE 20 SQ CM/< Modifier: Quantity: 1 CPT4 Code: Description: ICD-10 Diagnosis Description L97.812 Non-pressure chronic ulcer of other part of right  lower leg with fat laye Modifier: r exposed Quantity: Physician Procedures CPT4 Code: 3086578 Description: 11042 - WC PHYS SUBQ TISS 20 SQ CM Modifier: Quantity: 1 CPT4 Code: Description: ICD-10 Diagnosis Description L97.812 Non-pressure chronic ulcer of other part of right lower leg with fat laye Modifier: r exposed Quantity: Electronic Signature(s) Signed: 05/15/2022 8:40:48 AM By: Lenda Kelp PA-C Entered By: Lenda Kelp on 05/15/2022 08:40:47

## 2022-05-19 NOTE — Progress Notes (Signed)
[name] ([id number]) Visit Report for 05/15/2022 Arrival Information Details Patient Name: [name], [name] Date of Service: 05/15/2022 8:00 AM Medical Record Number: [id number] Patient Account Number: [id number] Date of Birth/Sex: 05/05/1986 (36 y.o. F) Treating RN: Carlene Coria Primary Care Sorin Frimpong: Jac Canavan Other Clinician: Referring Darriana Deboy: Jac Canavan Treating Christyanna Mckeon/Extender: Skipper Cliche in Treatment: 15 Visit Information History Since Last Visit All ordered tests and consults were completed: No Patient Arrived: Wheel Chair Added or deleted any medications: No Arrival Time: 08:03 Any new allergies or adverse reactions: No Accompanied By: self Had a fall or experienced change in No Transfer Assistance: None activities of daily living that may affect Patient Identification Verified: Yes risk of falls: Secondary Verification Process Completed: Yes Signs or symptoms of abuse/neglect since last visito No Patient Requires Transmission-Based Precautions: No Hospitalized since last visit: No Patient Has Alerts: Yes Implantable device outside of the clinic excluding No cellular tissue based products placed in the center since last visit: Has Dressing in Place as Prescribed: Yes Has Compression in Place as Prescribed: Yes Pain Present Now: No Electronic Signature(s) Signed: 05/15/2022 10:58:33 AM By: Carlene Coria RN Entered By: Carlene Coria on 05/15/2022 08:04:36 [name] ([id number]) -------------------------------------------------------------------------------- Clinic Level of Care Assessment Details Patient Name: [name] Date of Service: 05/15/2022 8:00 AM Medical Record Number: [id number] Patient Account Number: [id number] Date of Birth/Sex: 27-Jun-1986 (35 y.o. F) Treating RN: Carlene Coria Primary Care Exavier Lina: Jac Canavan Other Clinician: Referring Makhayla Mcmurry: Jac Canavan Treating Kaegan Stigler/Extender: Skipper Cliche in Treatment:  15 Clinic Level of Care Assessment Items TOOL 1 Quantity Score '[]'  - Use when EandM and Procedure is performed on INITIAL visit 0 ASSESSMENTS - Nursing Assessment / Reassessment '[]'  - General Physical Exam (combine w/ comprehensive assessment (listed just below) when performed on new 0 pt. evals) '[]'  - 0 Comprehensive Assessment (HX, ROS, Risk Assessments, Wounds Hx, etc.) ASSESSMENTS - Wound and Skin Assessment / Reassessment '[]'  - Dermatologic / Skin Assessment (not related to wound area) 0 ASSESSMENTS - Ostomy and/or Continence Assessment and Care '[]'  - Incontinence Assessment and Management 0 '[]'  - 0 Ostomy Care Assessment and Management (repouching, etc.) PROCESS - Coordination of Care '[]'  - Simple Patient / Family Education for ongoing care 0 '[]'  - 0 Complex (extensive) Patient / Family Education for ongoing care '[]'  - 0 Staff obtains Programmer, systems, Records, Test Results / Process Orders '[]'  - 0 Staff telephones HHA, Nursing Homes / Clarify orders / etc '[]'  - 0 Routine Transfer to another Facility (non-emergent condition) '[]'  - 0 Routine Hospital Admission (non-emergent condition) '[]'  - 0 New Admissions / Biomedical engineer / Ordering NPWT, Apligraf, etc. '[]'  - 0 Emergency Hospital Admission (emergent condition) PROCESS - Special Needs '[]'  - Pediatric / Minor Patient Management 0 '[]'  - 0 Isolation Patient Management '[]'  - 0 Hearing / Language / Visual special needs '[]'  - 0 Assessment of Community assistance (transportation, D/C planning, etc.) '[]'  - 0 Additional assistance / Altered mentation '[]'  - 0 Support Surface(s) Assessment (bed, cushion, seat, etc.) INTERVENTIONS - Miscellaneous '[]'  - External ear exam 0 '[]'  - 0 Patient Transfer (multiple staff / Civil Service fast streamer / Similar devices) '[]'  - 0 Simple Staple / Suture removal (25 or less) '[]'  - 0 Complex Staple / Suture removal (26 or more) '[]'  - 0 Hypo/Hyperglycemic Management (do not check if billed separately) '[]'  - 0 Ankle /  Brachial Index (ABI) - do not check if billed separately Has the patient been seen at the hospital within the last three years: Yes  Total Score: 0 Level Of Care: ____ [name] ([id number]) Electronic Signature(s) Signed: 05/15/2022 10:58:33 AM By: Carlene Coria RN Entered By: Carlene Coria on 05/15/2022 08:19:31 [name] ([id number]) -------------------------------------------------------------------------------- Encounter Discharge Information Details Patient Name: [name] Date of Service: 05/15/2022 8:00 AM Medical Record Number: [id number] Patient Account Number: [id number] Date of Birth/Sex: 1986-09-18 (35 y.o. F) Treating RN: Carlene Coria Primary Care Ruby Logiudice: Jac Canavan Other Clinician: Referring Bettina Warn: Jac Canavan Treating [name] Meadow/Extender: Skipper Cliche in Treatment: 15 Encounter Discharge Information Items Post Procedure Vitals Discharge Condition: Stable Temperature (F): 98.3 Ambulatory Status: Ambulatory Pulse (bpm): 106 Discharge Destination: Home Respiratory Rate (breaths/min): 18 Transportation: Private Auto Blood Pressure (mmHg): 144/90 Accompanied By: self Schedule Follow-up Appointment: Yes Clinical Summary of Care: Patient Declined Electronic Signature(s) Signed: 05/15/2022 10:58:33 AM By: Carlene Coria RN Entered By: Carlene Coria on 05/15/2022 08:20:30 [name] ([id number]) -------------------------------------------------------------------------------- Lower Extremity Assessment Details Patient Name: [name] Date of Service: 05/15/2022 8:00 AM Medical Record Number: [id number] Patient Account Number: [id number] Date of Birth/Sex: 09-10-1986 (35 y.o. F) Treating RN: Carlene Coria Primary Care [place] Sarinana: Jac Canavan Other Clinician: Referring Finnean Cerami: Jac Canavan Treating Darlette Dubow/Extender: Skipper Cliche in Treatment: 15 Electronic Signature(s) Signed: 05/15/2022 10:58:33 AM By: Carlene Coria RN Entered By:  Carlene Coria on 05/15/2022 08:09:47 [name] ([id number]) -------------------------------------------------------------------------------- Multi Wound Chart Details Patient Name: [name] Date of Service: 05/15/2022 8:00 AM Medical Record Number: [id number] Patient Account Number: [id number] Date of Birth/Sex: 05/25/1986 (35 y.o. F) Treating RN: Carlene Coria Primary Care Takenya Travaglini: Jac Canavan Other Clinician: Referring Banks Chaikin: Jac Canavan Treating Olivier Frayre/Extender: Skipper Cliche in Treatment: 15 Vital Signs Height(in): 67 Pulse(bpm): 106 Weight(lbs): 219 Blood Pressure(mmHg): 144/90 Body Mass Index(BMI): 34.3 Temperature(F): 98.36 Respiratory Rate(breaths/min): 18 Photos: [N/A:N/A] Wound Location: Right Amputation Site - Below Knee N/A N/A Wounding Event: Gradually Appeared N/A N/A Primary Etiology: Abscess N/A N/A Comorbid History: Hypertension, Type II Diabetes N/A N/A Date Acquired: 01/14/2022 N/A N/A Weeks of Treatment: 15 N/A N/A Wound Status: Open N/A N/A Wound Recurrence: No N/A N/A Measurements L x W x D (cm) 1.7x3x0.1 N/A N/A Area (cm) : 4.006 N/A N/A Volume (cm) : 0.401 N/A N/A % Reduction in Area: 85.20% N/A N/A % Reduction in Volume: 99.60% N/A N/A Classification: Full Thickness With Exposed N/A N/A Support Structures Exudate Amount: Medium N/A N/A Exudate Type: Serosanguineous N/A N/A Exudate Color: red, brown N/A N/A Wound Margin: Flat and Intact N/A N/A Granulation Amount: Large (67-100%) N/A N/A Granulation Quality: Red N/A N/A Necrotic Amount: None Present (0%) N/A N/A Exposed Structures: Fat Layer (Subcutaneous Tissue): N/A N/A Yes Fascia: No Tendon: No Muscle: No Joint: No Bone: No Epithelialization: Small (1-33%) N/A N/A Treatment Notes Electronic Signature(s) Signed: 05/15/2022 10:58:33 AM By: Carlene Coria RN Entered By: Carlene Coria on 05/15/2022 08:10:02 [name]  ([id number]) -------------------------------------------------------------------------------- Multi-Disciplinary Care Plan Details Patient Name: [name] Date of Service: 05/15/2022 8:00 AM Medical Record Number: [id number] Patient Account Number: [id number] Date of Birth/Sex: 03/12/1986 (35 y.o. F) Treating RN: Carlene Coria Primary Care Audrina Marten: Jac Canavan Other Clinician: Referring Antwoine Zorn: Jac Canavan Treating Dontrell Stuck/Extender: Skipper Cliche in Treatment: 15 Active Inactive Wound/Skin Impairment Nursing Diagnoses: Knowledge deficit related to ulceration/compromised skin integrity Goals: Patient/caregiver will verbalize understanding of skin care regimen Date Initiated: 01/27/2022 Target Resolution Date: 05/25/2022 Goal Status: Active Ulcer/skin breakdown will have a volume reduction of 30% by week 4 Date Initiated: 01/27/2022 Date Inactivated: 04/24/2022 Target Resolution Date: 03/29/2022 Goal Status: Met Ulcer/skin breakdown will have a volume reduction of 50%  by week 8 Date Initiated: 01/27/2022 Date Inactivated: 04/24/2022 Target Resolution Date: 04/28/2022 Goal Status: Met Ulcer/skin breakdown will have a volume reduction of 80% by week 12 Date Initiated: 01/27/2022 Target Resolution Date: 05/29/2022 Goal Status: Active Ulcer/skin breakdown will heal within 14 weeks Date Initiated: 01/27/2022 Target Resolution Date: 06/29/2022 Goal Status: Active Interventions: Assess patient/caregiver ability to obtain necessary supplies Assess patient/caregiver ability to perform ulcer/skin care regimen upon admission and as needed Assess ulceration(s) every visit Notes: Electronic Signature(s) Signed: 05/15/2022 10:58:33 AM By: Carlene Coria RN Entered By: Carlene Coria on 05/15/2022 08:09:55 [name] ([id number]) -------------------------------------------------------------------------------- Pain Assessment Details Patient Name: [name] Date of Service:  05/15/2022 8:00 AM Medical Record Number: [id number] Patient Account Number: [id number] Date of Birth/Sex: 06-05-1986 (35 y.o. F) Treating RN: Carlene Coria Primary Care Jacorey Donaway: Jac Canavan Other Clinician: Referring Avalon Coppinger: Jac Canavan Treating Hendricks Schwandt/Extender: Skipper Cliche in Treatment: 15 Active Problems Location of Pain Severity and Description of Pain Patient Has Paino No Site Locations Pain Management and Medication Current Pain Management: Electronic Signature(s) Signed: 05/15/2022 10:58:33 AM By: Carlene Coria RN Entered By: Carlene Coria on 05/15/2022 08:05:06 [name] ([id number]) -------------------------------------------------------------------------------- Patient/Caregiver Education Details Patient Name: [name] Date of Service: 05/15/2022 8:00 AM Medical Record Number: [id number] Patient Account Number: [id number] Date of Birth/Gender: August 17, 1986 (35 y.o. F) Treating RN: Carlene Coria Primary Care Physician: Jac Canavan Other Clinician: Referring Physician: Jac Canavan Treating Physician/Extender: Skipper Cliche in Treatment: 15 Education Assessment Education Provided To: Patient Education Topics Provided Wound/Skin Impairment: Methods: Explain/Verbal Responses: See progress note Electronic Signature(s) Signed: 05/15/2022 10:58:33 AM By: Carlene Coria RN Entered By: Carlene Coria on 05/15/2022 08:19:51 [name] ([id number]) -------------------------------------------------------------------------------- Wound Assessment Details Patient Name: [name] Date of Service: 05/15/2022 8:00 AM Medical Record Number: [id number] Patient Account Number: [id number] Date of Birth/Sex: 1986/01/19 (35 y.o. F) Treating RN: Carlene Coria Primary Care Lesha Jager: Jac Canavan Other Clinician: Referring Decklan Mau: Jac Canavan Treating Beulah Matusek/Extender: Skipper Cliche in Treatment: 15 Wound Status Wound Number: 1 Primary  Etiology: Abscess Wound Location: Right Amputation Site - Below Knee Wound Status: Open Wounding Event: Gradually Appeared Comorbid History: Hypertension, Type II Diabetes Date Acquired: 01/14/2022 Weeks Of Treatment: 15 Clustered Wound: No Photos Wound Measurements Length: (cm) 1.7 Width: (cm) 3 Depth: (cm) 0.1 Area: (cm) 4.006 Volume: (cm) 0.401 % Reduction in Area: 85.2% % Reduction in Volume: 99.6% Epithelialization: Small (1-33%) Tunneling: No Undermining: No Wound Description Classification: Full Thickness With Exposed Support Structures Wound Margin: Flat and Intact Exudate Amount: Medium Exudate Type: Serosanguineous Exudate Color: red, brown Foul Odor After Cleansing: No Slough/Fibrino No Wound Bed Granulation Amount: Large (67-100%) Exposed Structure Granulation Quality: Red Fascia Exposed: No Necrotic Amount: None Present (0%) Fat Layer (Subcutaneous Tissue) Exposed: Yes Tendon Exposed: No Muscle Exposed: No Joint Exposed: No Bone Exposed: No Treatment Notes Wound #1 (Amputation Site - Below Knee) Wound Laterality: Right Cleanser Peri-Wound Care Topical [place], Nakya ([id number]) Primary Dressing Prisma 4.34 (in) Discharge Instruction: Moisten w/normal saline or sterile water; Cover wound as directed. Do not remove from wound bed. Secondary Dressing ABD Pad 5x9 (in/in) Discharge Instruction: Cover with ABD pad Secured With [place] H Soft Cloth Surgical Tape, 2x2 (in/yd) Compression Wrap Compression Stockings Add-Ons Electronic Signature(s) Signed: 05/15/2022 10:58:33 AM By: Carlene Coria RN Entered By: Carlene Coria on 05/15/2022 08:09:36 [name] ([id number]) -------------------------------------------------------------------------------- Vitals Details Patient Name: [name] Date of Service: 05/15/2022 8:00 AM Medical Record Number: [id number] Patient Account Number: [id number] Date of Birth/Sex: Aug 16, 1986 (35  y.o. F) Treating RN: Carlene Coria Primary Care Arvil Utz: Jac Canavan Other Clinician: Referring Omer Monter: Jac Canavan Treating Makayleigh Poliquin/Extender: Skipper Cliche in Treatment: 15 Vital Signs Time Taken: 08:04 Temperature (F): 98.36 Height (in): 67 Pulse (bpm): 106 Weight (lbs): 219 Respiratory Rate (breaths/min): 18 Body Mass Index (BMI): 34.3 Blood Pressure (mmHg): 144/90 Reference Range: 80 - 120 mg / dl Electronic Signature(s) Signed: 05/15/2022 10:58:33 AM By: Carlene Coria RN Entered By: Carlene Coria on 05/15/2022 08:04:57

## 2022-05-22 ENCOUNTER — Encounter: Payer: Medicaid Other | Admitting: Physician Assistant

## 2022-05-22 DIAGNOSIS — T8781 Dehiscence of amputation stump: Secondary | ICD-10-CM | POA: Diagnosis not present

## 2022-05-22 NOTE — Progress Notes (Signed)
TORSHA, LEMUS ([id number]) Visit Report for 05/22/2022 Chief Complaint Document Details Patient Name: [name], [name] Date of Service: 05/22/2022 8:00 AM Medical Record Number: [id number] Patient Account Number: [id number] Date of Birth/Sex: 05-Jun-1986 (36 y.o. F) Treating RN: Yevonne Pax Primary Care Provider: Lynnell Chad Other Clinician: Referring Provider: Lynnell Chad Treating Provider/Extender: Rowan Blase in Treatment: 16 Information Obtained from: Patient Chief Complaint Right BKA Amputation Site Disruption Electronic Signature(s) Signed: 05/22/2022 8:07:17 AM By: Lenda Kelp PA-C Entered By: Lenda Kelp on 05/22/2022 08:07:17 [name] ([id number]) -------------------------------------------------------------------------------- Problem List Details Patient Name: [name] Date of Service: 05/22/2022 8:00 AM Medical Record Number: [id number] Patient Account Number: [id number] Date of Birth/Sex: 05/08/1986 (35 y.o. F) Treating RN: Yevonne Pax Primary Care Provider: Lynnell Chad Other Clinician: Referring Provider: Lynnell Chad Treating Provider/Extender: Rowan Blase in Treatment: 16 Active Problems ICD-10 Encounter Code Description Active Date MDM Diagnosis E11.622 Type 2 diabetes mellitus with other skin ulcer 01/27/2022 No Yes T81.31XA Disruption of external operation (surgical) wound, not elsewhere 01/27/2022 No Yes classified, initial encounter L97.812 Non-pressure chronic ulcer of other part of right lower leg with fat layer 01/27/2022 No Yes exposed E11.43 Type 2 diabetes mellitus with diabetic autonomic (poly)neuropathy 01/27/2022 No Yes I10 Essential (primary) hypertension 01/27/2022 No Yes Inactive Problems Resolved Problems Electronic Signature(s) Signed: 05/22/2022 8:07:15 AM By: Lenda Kelp PA-C Entered By: Lenda Kelp on 05/22/2022 08:07:14

## 2022-05-25 NOTE — Progress Notes (Signed)
[name], [name] ([id number]) Visit Report for 05/22/2022 Arrival Information Details Patient Name: [name], [name] Date of Service: 05/22/2022 8:00 AM Medical Record Number: [id number] Patient Account Number: [id number] Date of Birth/Sex: [birth year] (36 y.o. F) Treating RN: Carlene Coria Primary Care Ziona Wickens: Jac Canavan Other Clinician: Referring Kamauri Kathol: Jac Canavan Treating Ramzey Petrovic/Extender: Skipper Cliche in Treatment: 75 Visit Information History Since Last Visit All ordered tests and consults were completed: No Patient Arrived: Wheel Chair Added or deleted any medications: No Arrival Time: 08:05 Any new allergies or adverse reactions: No Accompanied By: self Had a fall or experienced change in No Transfer Assistance: None activities of daily living that may affect Patient Identification Verified: Yes risk of falls: Secondary Verification Process Completed: Yes Signs or symptoms of abuse/neglect since last visito No Patient Requires Transmission-Based Precautions: No Hospitalized since last visit: No Patient Has Alerts: Yes Implantable device outside of the clinic excluding No cellular tissue based products placed in the center since last visit: Has Dressing in Place as Prescribed: Yes Pain Present Now: No Electronic Signature(s) Signed: 05/25/2022 4:19:23 PM By: Carlene Coria RN Entered By: Carlene Coria on 05/22/2022 08:05:55 [name] ([id number]) -------------------------------------------------------------------------------- Clinic Level of Care Assessment Details Patient Name: [name] Date of Service: 05/22/2022 8:00 AM Medical Record Number: [id number] Patient Account Number: [id number] Date of Birth/Sex: Feb 02, 1986 (36 y.o. F) Treating RN: Carlene Coria Primary Care Marsh Heckler: Jac Canavan Other Clinician: Referring Juana Haralson: Jac Canavan Treating Nikai Quest/Extender: Skipper Cliche in Treatment: 16 Clinic Level of Care Assessment  Items TOOL 4 Quantity Score X - Use when only an EandM is performed on FOLLOW-UP visit 1 0 ASSESSMENTS - Nursing Assessment / Reassessment X - Reassessment of Co-morbidities (includes updates in patient status) 1 10 X- 1 5 Reassessment of Adherence to Treatment Plan ASSESSMENTS - Wound and Skin Assessment / Reassessment X - Simple Wound Assessment / Reassessment - one wound 1 5 _0  - 0 Complex Wound Assessment / Reassessment - multiple wounds _1  - 0 Dermatologic / Skin Assessment (not related to wound area) ASSESSMENTS - Focused Assessment _2  - Circumferential Edema Measurements - multi extremities 0 _3  - 0 Nutritional Assessment / Counseling / Intervention _4  - 0 Lower Extremity Assessment (monofilament, tuning fork, pulses) _5  - 0 Peripheral Arterial Disease Assessment (using hand held doppler) ASSESSMENTS - Ostomy and/or Continence Assessment and Care _6  - Incontinence Assessment and Management 0 _7  - 0 Ostomy Care Assessment and Management (repouching, etc.) PROCESS - Coordination of Care X - Simple Patient / Family Education for ongoing care 1 15 _8  - 0 Complex (extensive) Patient / Family Education for ongoing care _9  - 0 Staff obtains Programmer, systems, Records, Test Results / Process Orders _10  - 0 Staff telephones HHA, Nursing Homes / Clarify orders / etc _11  - 0 Routine Transfer to another Facility (non-emergent condition) _12  - 0 Routine Hospital Admission (non-emergent condition) _13  - 0 New Admissions / Biomedical engineer / Ordering NPWT, Apligraf, etc. _14  - 0 Emergency Hospital Admission (emergent condition) X- 1 10 Simple Discharge Coordination _15  - 0 Complex (extensive) Discharge Coordination PROCESS - Special Needs _16  - Pediatric / Minor Patient Management 0 _17  - 0 Isolation Patient Management _18  - 0 Hearing / Language / Visual special needs _19  - 0 Assessment of Community assistance (transportation, D/C planning, etc.) _20  - 0 Additional assistance /  Altered mentation _21  - 0 Support Surface(s) Assessment (bed, cushion, seat, etc.) INTERVENTIONS - Wound Cleansing / Measurement [name], [name] ([id number]) X- 1 5 Simple Wound Cleansing - one wound _22  -  0 Complex Wound Cleansing - multiple wounds X- 1 5 Wound Imaging (photographs - any number of wounds) _0  - 0 Wound Tracing (instead of photographs) X- 1 5 Simple Wound Measurement - one wound _1  - 0 Complex Wound Measurement - multiple wounds INTERVENTIONS - Wound Dressings X - Small Wound Dressing one or multiple wounds 1 10 _2  - 0 Medium Wound Dressing one or multiple wounds _3  - 0 Large Wound Dressing one or multiple wounds <[id number]>_0<\/NLZJQBHALPFXTKWI>_0  - 0 Application of Medications - topical <XBDZHGDJMEQASTMH>_9<\/QQIWLNLGXQJJHERD>_4  - 0 Application of Medications - injection INTERVENTIONS - Miscellaneous _6  - External ear exam 0 _7  - 0 Specimen Collection (cultures, biopsies, blood, body fluids, etc.) _8  - 0 Specimen(s) / Culture(s) sent or taken to Lab for analysis _9  - 0 Patient Transfer (multiple staff / Civil Service fast streamer / Similar devices) _10  - 0 Simple Staple / Suture removal (25 or less) _11  - 0 Complex Staple / Suture removal (26 or more) _12  - 0 Hypo / Hyperglycemic Management (close monitor of Blood Glucose) _13  - 0 Ankle / Brachial Index (ABI) - do not check if billed separately X- 1 5 Vital Signs Has the patient been seen at the hospital within the last three years: Yes Total Score: 75 Level Of Care: New/Established - Level 2 Electronic Signature(s) Signed: 05/25/2022 4:19:23 PM By: Carlene Coria RN Entered By: Carlene Coria on 05/22/2022 08:10:34 [name] ([id number]) -------------------------------------------------------------------------------- Encounter Discharge Information Details Patient Name: [name] Date of Service: 05/22/2022 8:00 AM Medical Record Number: [id number] Patient Account Number: [id number] Date of Birth/Sex: 14-Jan-1986 (36 y.o. F) Treating RN: Carlene Coria Primary Care Azreal Stthomas: Jac Canavan Other Clinician: Referring Gearldene Fiorenza: Jac Canavan Treating Kamau Weatherall/Extender: Skipper Cliche in Treatment: 16 Encounter Discharge Information Items Discharge Condition: Stable Ambulatory Status: Wheelchair Discharge Destination: Home Transportation: Private Auto Accompanied By: self Schedule Follow-up Appointment: Yes Clinical Summary of Care: Electronic Signature(s) Signed: 05/25/2022 4:19:23 PM By: Carlene Coria RN Entered By: Carlene Coria on 05/22/2022 08:11:16 [name] ([id number]) -------------------------------------------------------------------------------- Lower Extremity Assessment Details Patient Name: [name] Date of Service: 05/22/2022 8:00 AM Medical Record Number: [id number] Patient Account Number: [id number] Date of Birth/Sex: 02/21/1986 (36 y.o. F) Treating RN: Carlene Coria Primary Care Erick Murin: Jac Canavan Other Clinician: Referring Lajune Perine: Jac Canavan Treating Janesa Dockery/Extender: Skipper Cliche in Treatment: 16 Electronic Signature(s) Signed: 05/25/2022 4:19:23 PM By: Carlene Coria RN Entered By: Carlene Coria on 05/22/2022 08:09:27 [name] ([id number]) -------------------------------------------------------------------------------- Multi Wound Chart Details Patient Name: [name] Date of Service: 05/22/2022 8:00 AM Medical Record Number: [id number] Patient Account Number: [id number] Date of Birth/Sex: 20-Sep-1986 (36 y.o. F) Treating RN: Carlene Coria Primary Care Lynzie Cliburn: Jac Canavan Other Clinician: Referring Tranell Wojtkiewicz: Jac Canavan Treating Sanchez Hemmer/Extender: Skipper Cliche in Treatment: 16 Vital Signs Height(in): 67 Pulse(bpm): 108 Weight(lbs): 219 Blood Pressure(mmHg): 144/91 Body Mass Index(BMI): 34.3 Temperature(F): 98.3 Respiratory Rate(breaths/min): 18 Photos: [N/A:N/A] Wound Location: Right Amputation Site - Below Knee N/A N/A Wounding Event: Gradually Appeared N/A N/A Primary Etiology:  Abscess N/A N/A Comorbid History: Hypertension, Type II Diabetes N/A N/A Date Acquired: 01/14/2022 N/A N/A Weeks of Treatment: 16 N/A N/A Wound Status: Open N/A N/A Wound Recurrence: No N/A N/A Measurements L x W x D (cm) 1.2x1.5x0.1 N/A N/A Area (cm) : 1.414 N/A N/A Volume (cm) : 0.141 N/A N/A % Reduction in Area: 94.80% N/A N/A % Reduction in Volume: 99.90% N/A N/A Classification: Full Thickness With Exposed N/A N/A Support Structures Exudate Amount: Medium N/A N/A Exudate Type: Serosanguineous N/A N/A Exudate Color: red, brown N/A N/A  Wound Margin: Flat and Intact N/A N/A Granulation Amount: Large (67-100%) N/A N/A Granulation Quality: Red N/A N/A Necrotic Amount: None Present (0%) N/A N/A Exposed Structures: Fat Layer (Subcutaneous Tissue): N/A N/A Yes Fascia: No Tendon: No Muscle: No Joint: No Bone: No Epithelialization: Small (1-33%) N/A N/A Treatment Notes Electronic Signature(s) Signed: 05/25/2022 4:19:23 PM By: Carlene Coria RN Entered By: Carlene Coria on 05/22/2022 08:09:50 [name] ([id number]) -------------------------------------------------------------------------------- Multi-Disciplinary Care Plan Details Patient Name: [name] Date of Service: 05/22/2022 8:00 AM Medical Record Number: [id number] Patient Account Number: [id number] Date of Birth/Sex: 1986/07/26 (36 y.o. F) Treating RN: Carlene Coria Primary Care Tanise Russman: Jac Canavan Other Clinician: Referring Ademola Vert: Jac Canavan Treating Britanee Vanblarcom/Extender: Skipper Cliche in Treatment: 16 Active Inactive Wound/Skin Impairment Nursing Diagnoses: Knowledge deficit related to ulceration/compromised skin integrity Goals: Patient/caregiver will verbalize understanding of skin care regimen Date Initiated: 01/27/2022 Target Resolution Date: 05/25/2022 Goal Status: Active Ulcer/skin breakdown will have a volume reduction of 30% by week 4 Date Initiated: 01/27/2022 Date Inactivated:  04/24/2022 Target Resolution Date: 03/29/2022 Goal Status: Met Ulcer/skin breakdown will have a volume reduction of 50% by week 8 Date Initiated: 01/27/2022 Date Inactivated: 04/24/2022 Target Resolution Date: 04/28/2022 Goal Status: Met Ulcer/skin breakdown will have a volume reduction of 80% by week 12 Date Initiated: 01/27/2022 Target Resolution Date: 05/29/2022 Goal Status: Active Ulcer/skin breakdown will heal within 14 weeks Date Initiated: 01/27/2022 Target Resolution Date: 06/29/2022 Goal Status: Active Interventions: Assess patient/caregiver ability to obtain necessary supplies Assess patient/caregiver ability to perform ulcer/skin care regimen upon admission and as needed Assess ulceration(s) every visit Notes: Electronic Signature(s) Signed: 05/25/2022 4:19:23 PM By: Carlene Coria RN Entered By: Carlene Coria on 05/22/2022 08:09:40 [name] ([id number]) -------------------------------------------------------------------------------- Pain Assessment Details Patient Name: [name] Date of Service: 05/22/2022 8:00 AM Medical Record Number: [id number] Patient Account Number: [id number] Date of Birth/Sex: [birth year] (36 y.o. F) Treating RN: Carlene Coria Primary Care Deforrest Bogle: Jac Canavan Other Clinician: Referring Watt Geiler: Jac Canavan Treating Takeru Bose/Extender: Skipper Cliche in Treatment: 16 Active Problems Location of Pain Severity and Description of Pain Patient Has Paino No Site Locations Pain Management and Medication Current Pain Management: Electronic Signature(s) Signed: 05/25/2022 4:19:23 PM By: Carlene Coria RN Entered By: Carlene Coria on 05/22/2022 08:06:24 [name] ([id number]) -------------------------------------------------------------------------------- Patient/Caregiver Education Details Patient Name: [name] Date of Service: 05/22/2022 8:00 AM Medical Record Number: [id number] Patient Account Number: [id number] Date of  Birth/Gender: [birth year] (36 y.o. F) Treating RN: Carlene Coria Primary Care Physician: Jac Canavan Other Clinician: Referring Physician: Jac Canavan Treating Physician/Extender: Skipper Cliche in Treatment: 16 Education Assessment Education Provided To: Patient Education Topics Provided Wound/Skin Impairment: Methods: Explain/Verbal Responses: State content correctly Electronic Signature(s) Signed: 05/25/2022 4:19:23 PM By: Carlene Coria RN Entered By: Carlene Coria on 05/22/2022 08:10:47 [name] ([id number]) -------------------------------------------------------------------------------- Wound Assessment Details Patient Name: [name] Date of Service: 05/22/2022 8:00 AM Medical Record Number: [id number] Patient Account Number: [id number] Date of Birth/Sex: 03-Nov-1985 (36 y.o. F) Treating RN: Carlene Coria Primary Care Eulan Heyward: Jac Canavan Other Clinician: Referring Kerman Pfost: Jac Canavan Treating Collin Rengel/Extender: Skipper Cliche in Treatment: 16 Wound Status Wound Number: 1 Primary Etiology: Abscess Wound Location: Right Amputation Site - Below Knee Wound Status: Open Wounding Event: Gradually Appeared Comorbid History: Hypertension, Type II Diabetes Date Acquired: 01/14/2022 Weeks Of Treatment: 16 Clustered Wound: No Photos Wound Measurements Length: (cm) 1.2 Width: (cm) 1.5 Depth: (cm) 0.1 Area: (cm) 1.414 Volume: (cm) 0.141 % Reduction in Area: 94.8% % Reduction in Volume: 99.9% Epithelialization: Small (1-33%) Tunneling:  No Undermining: No Wound Description Classification: Full Thickness With Exposed Support Structures Wound Margin: Flat and Intact Exudate Amount: Medium Exudate Type: Serosanguineous Exudate Color: red, brown Foul Odor After Cleansing: No Slough/Fibrino No Wound Bed Granulation Amount: Large (67-100%) Exposed Structure Granulation Quality: Red Fascia Exposed: No Necrotic Amount: None Present (0%) Fat Layer  (Subcutaneous Tissue) Exposed: Yes Tendon Exposed: No Muscle Exposed: No Joint Exposed: No Bone Exposed: No Treatment Notes Wound #1 (Amputation Site - Below Knee) Wound Laterality: Right Cleanser Peri-Wound Care Topical Route, Yides ([id number]) Primary Dressing Prisma 4.34 (in) Discharge Instruction: Moisten w/normal saline or sterile water; Cover wound as directed. Do not remove from wound bed. Secondary Dressing ABD Pad 5x9 (in/in) Discharge Instruction: Cover with ABD pad Secured With Filley H Soft Cloth Surgical Tape, 2x2 (in/yd) Compression Wrap Compression Stockings Add-Ons Electronic Signature(s) Signed: 05/25/2022 4:19:23 PM By: Carlene Coria RN Entered By: Carlene Coria on 05/22/2022 08:09:14 [name] ([id number]) -------------------------------------------------------------------------------- Vitals Details Patient Name: [name] Date of Service: 05/22/2022 8:00 AM Medical Record Number: [id number] Patient Account Number: [id number] Date of Birth/Sex: [birth year] (36 y.o. F) Treating RN: Carlene Coria Primary Care Tanylah Schnoebelen: Jac Canavan Other Clinician: Referring Arthur Speagle: Jac Canavan Treating Ciria Bernardini/Extender: Skipper Cliche in Treatment: 16 Vital Signs Time Taken: 08:05 Temperature (F): 98.3 Height (in): 67 Pulse (bpm): 108 Weight (lbs): 219 Respiratory Rate (breaths/min): 18 Body Mass Index (BMI): 34.3 Blood Pressure (mmHg): 144/91 Reference Range: 80 - 120 mg / dl Electronic Signature(s) Signed: 05/25/2022 4:19:23 PM By: Carlene Coria RN Entered By: Carlene Coria on 05/22/2022 08:06:14

## 2022-05-29 ENCOUNTER — Encounter: Payer: Self-pay | Admitting: Nurse Practitioner

## 2022-05-29 ENCOUNTER — Other Ambulatory Visit: Payer: Self-pay | Admitting: Nurse Practitioner

## 2022-05-29 ENCOUNTER — Encounter: Payer: Medicaid Other | Admitting: Physician Assistant

## 2022-05-29 DIAGNOSIS — T8781 Dehiscence of amputation stump: Secondary | ICD-10-CM | POA: Diagnosis not present

## 2022-05-29 NOTE — Progress Notes (Addendum)
[name], [name] ([id number]) Visit Report for 05/29/2022 Chief Complaint Document Details Patient Name: [name], [name] Date of Service: 05/29/2022 8:00 AM Medical Record Number: [id number] Patient Account Number: [id number] Date of Birth/Sex: 12-25-1985 (36 y.o. F) Treating RN: Yevonne Pax Primary Care Provider: Lynnell Chad Other Clinician: Referring Provider: Lynnell Chad Treating Provider/Extender: Rowan Blase in Treatment: 17 Information Obtained from: Patient Chief Complaint Right BKA Amputation Site Disruption Electronic Signature(s) Signed: 05/29/2022 8:21:14 AM By: Lenda Kelp PA-C Entered By: Lenda Kelp on 05/29/2022 08:21:14 [name] ([id number]) -------------------------------------------------------------------------------- Debridement Details Patient Name: [name] Date of Service: 05/29/2022 8:00 AM Medical Record Number: [id number] Patient Account Number: [id number] Date of Birth/Sex: [birth year] (35 y.o. F) Treating RN: Yevonne Pax Primary Care Provider: Lynnell Chad Other Clinician: Referring Provider: Lynnell Chad Treating Provider/Extender: Rowan Blase in Treatment: 17 Debridement Performed for Wound #1 Right Amputation Site - Below Knee Assessment: Performed By: Physician Nelida Meuse., PA-C Debridement Type: Debridement Level of Consciousness (Pre- Awake and Alert procedure): Pre-procedure Verification/Time Out Yes - 08:30 Taken: Start Time: 08:30 Total Area Debrided (L x W): 1.5 (cm) x 2.6 (cm) = 3.9 (cm) Tissue and other material Viable, Non-Viable, Subcutaneous, Skin: Dermis , Skin: Epidermis, Biofilm debrided: Level: Skin/Subcutaneous Tissue Debridement Description: Excisional Instrument: Curette Bleeding: Moderate Hemostasis Achieved: Pressure End Time: 08:34 Procedural Pain: 0 Post Procedural Pain: 0 Response to Treatment: Procedure was tolerated well Level of Consciousness (Post- Awake and  Alert procedure): Post Debridement Measurements of Total Wound Length: (cm) 1.5 Width: (cm) 2.6 Depth: (cm) 0.1 Volume: (cm) 0.306 Character of Wound/Ulcer Post Debridement: Improved Post Procedure Diagnosis Same as Pre-procedure Electronic Signature(s) Signed: 05/29/2022 4:28:34 PM By: Yevonne Pax RN Signed: 05/29/2022 5:17:38 PM By: Lenda Kelp PA-C Entered By: Yevonne Pax on 05/29/2022 08:34:18 [name] ([id number]) -------------------------------------------------------------------------------- HPI Details Patient Name: [name] Date of Service: 05/29/2022 8:00 AM Medical Record Number: [id number] Patient Account Number: [id number] Date of Birth/Sex: [birth year] (35 y.o. F) Treating RN: Yevonne Pax Primary Care Provider: Lynnell Chad Other Clinician: Referring Provider: Lynnell Chad Treating Provider/Extender: Rowan Blase in Treatment: 17 History of Present Illness HPI Description: 01-27-2022 upon evaluation today patient appears to be doing somewhat poorly in regard to her right below-knee amputation. This unfortunately developed a abscess internally following the initial amputation which was January 2023. Subsequently she was getting very close to starting to work on the prosthesis when she had an area that somewhat bulged out ended up being an abscess and the incision and drainage was performed April 22. Subsequently she did have antibiotic beads placed in a wound VAC after this was cleared out. Her most recent reported hemoglobin A1c was 6.4. With that being said the patient tells me that her blood sugars seem to be under pretty good control as best she can tell at this point. She also tells me that the wound VAC seems to be functioning appropriately although upon evaluation the did not appear to be anything tucked into the region of undermining and depth in the roughly 9-12 o'clock location based on what we saw today. This is also where the antibiotic  beads are and I can see those in place. I do believe that she is getting need to have the wound VAC tucked down into this location. Patient does have a history of diabetes mellitus type 2, diabetic neuropathy, and hypertension. This initial amputation occurred as a result of her having sewing needles that actually went into her heel she did not know about it  started having trouble with her heel and ended up with a significant infection which was stated to be necrotizing and she subsequently had to have emergency amputation. 02-13-2022 upon evaluation today patient appears to be doing well with regard to her wound. She has been tolerating the dressing changes without complication. The wound VAC does seem to be doing a great job. Fortunately I do not see any evidence of active infection locally or systemically at this time which is great news. She still has some of the antibiotic beads in the base of the wound. 02-20-2022 upon evaluation today patient appears to be doing well currently. There does not appear to be any evidence of active infection locally nor systemically which is great news and overall I am extremely pleased with where we stand today. I do believe that the wound VAC is doing a good job here and the color of the drainage is improved again were unsure as to whether this may have been the antibiotic beads or if there was something such as Pseudomonas causing the greenish discoloration to the drainage either way she is doing significantly better. 03-06-2022 upon evaluation today patient appears to be doing well with regard to her wound. The only issue I see is that she does have a little bit of a pocket of fluid that collected in the deeper section everything else is actually measuring smaller and looks better. This was a point where I really did expect this was been a turnaround and start doing a lot better. I think we are especially seeing that right now. 03-20-2022 upon evaluation today  patient appears to be doing well with regard to her wound in fact this is actually significantly improved compared to last time I saw her. Fortunately I do not see any evidence of active infection at this time. No fevers, chills, nausea, vomiting, or diarrhea. 03-27-2022 upon evaluation today patient appears to be doing excellent in regard to her wound. This is showing signs of improvement which is great news and overall I am extremely pleased with where we stand currently. I do not see any evidence of active infection locally or systemically which is great news. No fevers, chills, nausea, vomiting, or diarrhea. 04-03-2022 upon evaluation today patient's wound is showing signs of significant improvement. I am still very pleased with where we stand and how things are going. I think that she is really making excellent progress which is great news. I do not see any signs of active infection at this time. 04-10-2022 upon evaluation today patient's wound actually showed signs of good granulation and epithelization at this point. Fortunately I do not see any evidence of active infection at this time which is great news and overall I am extremely pleased with where things stand currently. No fevers, chills, nausea, vomiting, or diarrhea. 04-17-2022 upon evaluation today patient appears to be doing well with regard to her wound she is actually showing signs of significant improvement which is great news. I do believe that she is making good progress with the current wound care measures. Fortunately I do not see any evidence of active infection locally or systemically which is great news. 7/24; right BKA site secondary to an abscess. Per our intake nurse wound VAC came off 2 weeks ago she has been using silver collagen. Nice improvements in wound dimensions. She does not have a prosthesis as of yet 05-01-2022 upon evaluation today patient appears to be doing well with regard to her wound in fact this is measuring  significantly smaller and is really doing quite well. Fortunately I do not see any evidence of active infection locally or systemically at this time. 05-08-2022 upon evaluation today patient's wound is actually showing signs of excellent improvement. I am actually very pleased with where we stand I think that she is making great progress and I do not see any signs of active infection at this time which is great news. No fevers, chills, nausea, vomiting, or diarrhea. 05-15-2022 upon evaluation today patient appears to be doing well currently in regard to her wound. This is actually showing signs of excellent improvement is noted completely to the surface there is just a little bit of biofilm although we are can have to clear this away with sharp debridement as she is saline and gauze treatment is not enough to get this loosened today. 05-22-2022 upon evaluation today patient appears to be doing excellent in regard to her wound this is showing signs of excellent improvement the surgeon she saw just last week he was extremely pleased with how things were doing and in fact was shocked with the progress. 05-29-2022 upon evaluation today patient appears to be doing well currently in regard to her wound the wound does appear to have little bit of [place], Malaia ([id number]) slough and biofilm on the surface of the wound that would not clearway today but the good news is she seems to be doing excellent otherwise. There does not appear to be any signs of active infection locally or systemically at this time. Electronic Signature(s) Signed: 05/29/2022 9:23:48 AM By: Lenda Kelp PA-C Entered By: Lenda Kelp on 05/29/2022 09:23:48 [name] ([id number]) -------------------------------------------------------------------------------- Physical Exam Details Patient Name: [name] Date of Service: 05/29/2022 8:00 AM Medical Record Number: [id number] Patient Account Number: [id number] Date of  Birth/Sex: 23-Jan-1986 (35 y.o. F) Treating RN: Yevonne Pax Primary Care Provider: Lynnell Chad Other Clinician: Referring Provider: Lynnell Chad Treating Provider/Extender: Rowan Blase in Treatment: 17 Constitutional Well-nourished and well-hydrated in no acute distress. Respiratory normal breathing without difficulty. Psychiatric this patient is able to make decisions and demonstrates good insight into disease process. Alert and Oriented x 3. pleasant and cooperative. Notes Upon inspection patient's wound bed actually showed excellent granulation and epithelization at this point. Fortunately there does not appear to be any signs of infection which is great news and overall I am extremely pleased with where we stand at this point. Electronic Signature(s) Signed: 05/29/2022 9:24:09 AM By: Lenda Kelp PA-C Entered By: Lenda Kelp on 05/29/2022 09:24:08 [name] ([id number]) -------------------------------------------------------------------------------- Physician Orders Details Patient Name: [name] Date of Service: 05/29/2022 8:00 AM Medical Record Number: [id number] Patient Account Number: [id number] Date of Birth/Sex: [birth year] (35 y.o. F) Treating RN: Yevonne Pax Primary Care Provider: Lynnell Chad Other Clinician: Referring Provider: Lynnell Chad Treating Provider/Extender: Rowan Blase in Treatment: 17 Verbal / Phone Orders: No Diagnosis Coding ICD-10 Coding Code Description E11.622 Type 2 diabetes mellitus with other skin ulcer T81.31XA Disruption of external operation (surgical) wound, not elsewhere classified, initial encounter L97.812 Non-pressure chronic ulcer of other part of right lower leg with fat layer exposed E11.43 Type 2 diabetes mellitus with diabetic autonomic (poly)neuropathy I10 Essential (primary) hypertension Follow-up Appointments o Return Appointment in 1 week. Bathing/ Shower/ Hygiene o May shower; gently  cleanse wound with antibacterial soap, rinse and pat dry prior to dressing wounds Anesthetic (Use 'Patient Medications' Section for Anesthetic Order Entry) o Lidocaine applied to wound bed Edema Control - Lymphedema / Segmental Compressive Device /  Other o Other: - stump shrinker Wound Treatment Wound #1 - Amputation Site - Below Knee Wound Laterality: Right Primary Dressing: Prisma 4.34 (in) (Generic) 3 x Per Week/30 Days Discharge Instructions: Moisten w/normal saline or sterile water; Cover wound as directed. Do not remove from wound bed. Secondary Dressing: ABD Pad 5x9 (in/in) (Generic) 3 x Per Week/30 Days Discharge Instructions: Cover with ABD pad Secured With: Medipore Tape - 27M Medipore H Soft Cloth Surgical Tape, 2x2 (in/yd) 3 x Per Week/30 Days Electronic Signature(s) Signed: 05/29/2022 4:28:34 PM By: Yevonne PaxEpps, Carrie RN Signed: 05/29/2022 5:17:38 PM By: Lenda KelpStone III, Denishia Citro PA-C Entered By: Yevonne PaxEpps, Carrie on 05/29/2022 08:30:22 Allison RotaHYLTON, [name] ([id number]) -------------------------------------------------------------------------------- Problem List Details Patient Name: Allison RotaHYLTON, [name] Date of Service: 05/29/2022 8:00 AM Medical Record Number: [id number] Patient Account Number: [id number] Date of Birth/Sex: Mar 09, 1986 (35 y.o. F) Treating RN: Yevonne PaxEpps, Carrie Primary Care Provider: Lynnell ChadIjaola, Onyeje Other Clinician: Referring Provider: Lynnell ChadIjaola, Onyeje Treating Provider/Extender: Rowan BlaseStone, Glendon Dunwoody Weeks in Treatment: 17 Active Problems ICD-10 Encounter Code Description Active Date MDM Diagnosis E11.622 Type 2 diabetes mellitus with other skin ulcer 01/27/2022 No Yes T81.31XA Disruption of external operation (surgical) wound, not elsewhere 01/27/2022 No Yes classified, initial encounter L97.812 Non-pressure chronic ulcer of other part of right lower leg with fat layer 01/27/2022 No Yes exposed E11.43 Type 2 diabetes mellitus with diabetic autonomic (poly)neuropathy 01/27/2022 No Yes I10 Essential  (primary) hypertension 01/27/2022 No Yes Inactive Problems Resolved Problems Electronic Signature(s) Signed: 05/29/2022 8:21:11 AM By: Lenda KelpStone III, Quandre Polinski PA-C Entered By: Lenda KelpStone III, Skyleigh Windle on 05/29/2022 08:21:10 Allison RotaHYLTON, [name] ([id number]) -------------------------------------------------------------------------------- Progress Note Details Patient Name: Allison RotaHYLTON, [name] Date of Service: 05/29/2022 8:00 AM Medical Record Number: [id number] Patient Account Number: [id number] Date of Birth/Sex: Mar 09, 1986 (35 y.o. F) Treating RN: Yevonne PaxEpps, Carrie Primary Care Provider: Lynnell ChadIjaola, Onyeje Other Clinician: Referring Provider: Lynnell ChadIjaola, Onyeje Treating Provider/Extender: Rowan BlaseStone, Znya Albino Weeks in Treatment: 17 Subjective Chief Complaint Information obtained from Patient Right BKA Amputation Site Disruption History of Present Illness (HPI) 01-27-2022 upon evaluation today patient appears to be doing somewhat poorly in regard to her right below-knee amputation. This unfortunately developed a abscess internally following the initial amputation which was January 2023. Subsequently she was getting very close to starting to work on the prosthesis when she had an area that somewhat bulged out ended up being an abscess and the incision and drainage was performed April 22. Subsequently she did have antibiotic beads placed in a wound VAC after this was cleared out. Her most recent reported hemoglobin A1c was 6.4. With that being said the patient tells me that her blood sugars seem to be under pretty good control as best she can tell at this point. She also tells me that the wound VAC seems to be functioning appropriately although upon evaluation the did not appear to be anything tucked into the region of undermining and depth in the roughly 9-12 o'clock location based on what we saw today. This is also where the antibiotic beads are and I can see those in place. I do believe that she is getting need to have the wound VAC tucked  down into this location. Patient does have a history of diabetes mellitus type 2, diabetic neuropathy, and hypertension. This initial amputation occurred as a result of her having sewing needles that actually went into her heel she did not know about it started having trouble with her heel and ended up with a significant infection which was stated to be necrotizing and she subsequently had to have emergency amputation. 02-13-2022 upon evaluation  today patient appears to be doing well with regard to her wound. She has been tolerating the dressing changes without complication. The wound VAC does seem to be doing a great job. Fortunately I do not see any evidence of active infection locally or systemically at this time which is great news. She still has some of the antibiotic beads in the base of the wound. 02-20-2022 upon evaluation today patient appears to be doing well currently. There does not appear to be any evidence of active infection locally nor systemically which is great news and overall I am extremely pleased with where we stand today. I do believe that the wound VAC is doing a good job here and the color of the drainage is improved again were unsure as to whether this may have been the antibiotic beads or if there was something such as Pseudomonas causing the greenish discoloration to the drainage either way she is doing significantly better. 03-06-2022 upon evaluation today patient appears to be doing well with regard to her wound. The only issue I see is that she does have a little bit of a pocket of fluid that collected in the deeper section everything else is actually measuring smaller and looks better. This was a point where I really did expect this was been a turnaround and start doing a lot better. I think we are especially seeing that right now. 03-20-2022 upon evaluation today patient appears to be doing well with regard to her wound in fact this is actually significantly  improved compared to last time I saw her. Fortunately I do not see any evidence of active infection at this time. No fevers, chills, nausea, vomiting, or diarrhea. 03-27-2022 upon evaluation today patient appears to be doing excellent in regard to her wound. This is showing signs of improvement which is great news and overall I am extremely pleased with where we stand currently. I do not see any evidence of active infection locally or systemically which is great news. No fevers, chills, nausea, vomiting, or diarrhea. 04-03-2022 upon evaluation today patient's wound is showing signs of significant improvement. I am still very pleased with where we stand and how things are going. I think that she is really making excellent progress which is great news. I do not see any signs of active infection at this time. 04-10-2022 upon evaluation today patient's wound actually showed signs of good granulation and epithelization at this point. Fortunately I do not see any evidence of active infection at this time which is great news and overall I am extremely pleased with where things stand currently. No fevers, chills, nausea, vomiting, or diarrhea. 04-17-2022 upon evaluation today patient appears to be doing well with regard to her wound she is actually showing signs of significant improvement which is great news. I do believe that she is making good progress with the current wound care measures. Fortunately I do not see any evidence of active infection locally or systemically which is great news. 7/24; right BKA site secondary to an abscess. Per our intake nurse wound VAC came off 2 weeks ago she has been using silver collagen. Nice improvements in wound dimensions. She does not have a prosthesis as of yet 05-01-2022 upon evaluation today patient appears to be doing well with regard to her wound in fact this is measuring significantly smaller and is really doing quite well. Fortunately I do not see any evidence of  active infection locally or systemically at this time. 05-08-2022 upon evaluation today patient's wound  is actually showing signs of excellent improvement. I am actually very pleased with where we stand I think that she is making great progress and I do not see any signs of active infection at this time which is great news. No fevers, chills, nausea, vomiting, or diarrhea. 05-15-2022 upon evaluation today patient appears to be doing well currently in regard to her wound. This is actually showing signs of excellent improvement is noted completely to the surface there is just a little bit of biofilm although we are can have to clear this away with sharp debridement as she is saline and gauze treatment is not enough to get this loosened today. [name], [name] ([id number]) 05-22-2022 upon evaluation today patient appears to be doing excellent in regard to her wound this is showing signs of excellent improvement the surgeon she saw just last week he was extremely pleased with how things were doing and in fact was shocked with the progress. 05-29-2022 upon evaluation today patient appears to be doing well currently in regard to her wound the wound does appear to have little bit of slough and biofilm on the surface of the wound that would not clearway today but the good news is she seems to be doing excellent otherwise. There does not appear to be any signs of active infection locally or systemically at this time. Objective Constitutional Well-nourished and well-hydrated in no acute distress. Vitals Time Taken: 8:06 AM, Height: 67 in, Weight: 219 lbs, BMI: 34.3, Temperature: 98.4 F, Pulse: 108 bpm, Respiratory Rate: 18 breaths/min, Blood Pressure: 132/92 mmHg. Respiratory normal breathing without difficulty. Psychiatric this patient is able to make decisions and demonstrates good insight into disease process. Alert and Oriented x 3. pleasant and cooperative. General Notes: Upon inspection patient's wound  bed actually showed excellent granulation and epithelization at this point. Fortunately there does not appear to be any signs of infection which is great news and overall I am extremely pleased with where we stand at this point. Integumentary (Hair, Skin) Wound #1 status is Open. Original cause of wound was Gradually Appeared. The date acquired was: 01/14/2022. The wound has been in treatment 17 weeks. The wound is located on the Right Amputation Site - Below Knee. The wound measures 1.5cm length x 2.6cm width x 0.1cm depth; 3.063cm^2 area and 0.306cm^3 volume. There is Fat Layer (Subcutaneous Tissue) exposed. There is no tunneling or undermining noted. There is a medium amount of serosanguineous drainage noted. The wound margin is flat and intact. There is large (67-100%) red granulation within the wound bed. There is no necrotic tissue within the wound bed. Assessment Active Problems ICD-10 Type 2 diabetes mellitus with other skin ulcer Disruption of external operation (surgical) wound, not elsewhere classified, initial encounter Non-pressure chronic ulcer of other part of right lower leg with fat layer exposed Type 2 diabetes mellitus with diabetic autonomic (poly)neuropathy Essential (primary) hypertension Procedures Wound #1 Pre-procedure diagnosis of Wound #1 is an Abscess located on the Right Amputation Site - Below Knee . There was a Excisional Skin/Subcutaneous Tissue Debridement with a total area of 3.9 sq cm performed by Nelida Meuse., PA-C. With the following instrument(s): Curette to remove Viable and Non-Viable tissue/material. Material removed includes Subcutaneous Tissue, Skin: Dermis, Skin: Epidermis, and Biofilm. No specimens were taken. A time out was conducted at 08:30, prior to the start of the procedure. A Moderate amount of bleeding was controlled with Pressure. The procedure was tolerated well with a pain level of 0 throughout and a pain level of  0 following the  procedure. Post Debridement Measurements: 1.5cm length x 2.6cm width x 0.1cm depth; 0.306cm^3 volume. Character of Wound/Ulcer Post Debridement is improved. Post procedure Diagnosis Wound #1: Same as Pre-Procedure [name], [name] ([id number]) Plan Follow-up Appointments: Return Appointment in 1 week. Bathing/ Shower/ Hygiene: May shower; gently cleanse wound with antibacterial soap, rinse and pat dry prior to dressing wounds Anesthetic (Use 'Patient Medications' Section for Anesthetic Order Entry): Lidocaine applied to wound bed Edema Control - Lymphedema / Segmental Compressive Device / Other: Other: - stump shrinker WOUND #1: - Amputation Site - Below Knee Wound Laterality: Right Primary Dressing: Prisma 4.34 (in) (Generic) 3 x Per Week/30 Days Discharge Instructions: Moisten w/normal saline or sterile water; Cover wound as directed. Do not remove from wound bed. Secondary Dressing: ABD Pad 5x9 (in/in) (Generic) 3 x Per Week/30 Days Discharge Instructions: Cover with ABD pad Secured With: Medipore Tape - 95M Medipore H Soft Cloth Surgical Tape, 2x2 (in/yd) 3 x Per Week/30 Days 1. I am good recommend that we go ahead and continue with the silver collagen dressing I think that still can be the way to go but is having changes little frequently. 2. Also can recommend we continue with ABD pad to cover. 3. I am also can recommend that patient should continue with the shrinker which is helping with edema control. We will see patient back for reevaluation in 1 week here in the clinic. If anything worsens or changes patient will contact our office for additional recommendations. Electronic Signature(s) Signed: 05/29/2022 9:24:41 AM By: Lenda Kelp PA-C Entered By: Lenda Kelp on 05/29/2022 09:24:41 [name] ([id number]) -------------------------------------------------------------------------------- SuperBill Details Patient Name: [name] Date of Service: 05/29/2022 Medical  Record Number: [id number] Patient Account Number: [id number] Date of Birth/Sex: Oct 20, 1985 (35 y.o. F) Treating RN: Yevonne Pax Primary Care Provider: Lynnell Chad Other Clinician: Referring Provider: Lynnell Chad Treating Provider/Extender: Rowan Blase in Treatment: 17 Diagnosis Coding ICD-10 Codes Code Description E11.622 Type 2 diabetes mellitus with other skin ulcer T81.31XA Disruption of external operation (surgical) wound, not elsewhere classified, initial encounter L97.812 Non-pressure chronic ulcer of other part of right lower leg with fat layer exposed E11.43 Type 2 diabetes mellitus with diabetic autonomic (poly)neuropathy I10 Essential (primary) hypertension Facility Procedures CPT4 Code: 35597416 Description: 11042 - DEB SUBQ TISSUE 20 SQ CM/< Modifier: Quantity: 1 CPT4 Code: Description: ICD-10 Diagnosis Description L97.812 Non-pressure chronic ulcer of other part of right lower leg with fat laye Modifier: r exposed Quantity: Physician Procedures CPT4 Code: 3845364 Description: 11042 - WC PHYS SUBQ TISS 20 SQ CM Modifier: Quantity: 1 CPT4 Code: Description: ICD-10 Diagnosis Description L97.812 Non-pressure chronic ulcer of other part of right lower leg with fat laye Modifier: r exposed Quantity: Electronic Signature(s) Signed: 05/29/2022 9:24:57 AM By: Lenda Kelp PA-C Entered By: Lenda Kelp on 05/29/2022 09:24:57

## 2022-05-29 NOTE — Progress Notes (Signed)
ANNALYSSE, SHOEMAKER ([id number]) Visit Report for 05/29/2022 Arrival Information Details Patient Name: [name], [name] Date of Service: 05/29/2022 8:00 AM Medical Record Number: [id number] Patient Account Number: [id number] Date of Birth/Sex: 1986-08-14 (36 y.o. F) Treating RN: Carlene Coria Primary Care Alexzander Dolinger: Jac Canavan Other Clinician: Referring Hudson Majkowski: Jac Canavan Treating Staria Birkhead/Extender: Skipper Cliche in Treatment: 90 Visit Information History Since Last Visit All ordered tests and consults were completed: No Patient Arrived: Wheel Chair Added or deleted any medications: No Arrival Time: 08:05 Any new allergies or adverse reactions: No Accompanied By: self Had a fall or experienced change in No Transfer Assistance: None activities of daily living that may affect Patient Identification Verified: Yes risk of falls: Secondary Verification Process Completed: Yes Signs or symptoms of abuse/neglect since last visito No Patient Requires Transmission-Based Precautions: No Hospitalized since last visit: No Patient Has Alerts: Yes Implantable device outside of the clinic excluding No cellular tissue based products placed in the center since last visit: Has Dressing in Place as Prescribed: Yes Has Compression in Place as Prescribed: Yes Pain Present Now: No Electronic Signature(s) Signed: 05/29/2022 4:28:34 PM By: Carlene Coria RN Entered By: Carlene Coria on 05/29/2022 08:05:52 [name] ([id number]) -------------------------------------------------------------------------------- Clinic Level of Care Assessment Details Patient Name: [name] Date of Service: 05/29/2022 8:00 AM Medical Record Number: [id number] Patient Account Number: [id number] Date of Birth/Sex: [birth year] (35 y.o. F) Treating RN: Carlene Coria Primary Care Demiya Magno: Jac Canavan Other Clinician: Referring Toriano Aikey: Jac Canavan Treating Sanjuanita Condrey/Extender: Skipper Cliche in Treatment:  17 Clinic Level of Care Assessment Items TOOL 1 Quantity Score '[]'  - Use when EandM and Procedure is performed on INITIAL visit 0 ASSESSMENTS - Nursing Assessment / Reassessment '[]'  - General Physical Exam (combine w/ comprehensive assessment (listed just below) when performed on new 0 pt. evals) '[]'  - 0 Comprehensive Assessment (HX, ROS, Risk Assessments, Wounds Hx, etc.) ASSESSMENTS - Wound and Skin Assessment / Reassessment '[]'  - Dermatologic / Skin Assessment (not related to wound area) 0 ASSESSMENTS - Ostomy and/or Continence Assessment and Care '[]'  - Incontinence Assessment and Management 0 '[]'  - 0 Ostomy Care Assessment and Management (repouching, etc.) PROCESS - Coordination of Care '[]'  - Simple Patient / Family Education for ongoing care 0 '[]'  - 0 Complex (extensive) Patient / Family Education for ongoing care '[]'  - 0 Staff obtains Programmer, systems, Records, Test Results / Process Orders '[]'  - 0 Staff telephones HHA, Nursing Homes / Clarify orders / etc '[]'  - 0 Routine Transfer to another Facility (non-emergent condition) '[]'  - 0 Routine Hospital Admission (non-emergent condition) '[]'  - 0 New Admissions / Biomedical engineer / Ordering NPWT, Apligraf, etc. '[]'  - 0 Emergency Hospital Admission (emergent condition) PROCESS - Special Needs '[]'  - Pediatric / Minor Patient Management 0 '[]'  - 0 Isolation Patient Management '[]'  - 0 Hearing / Language / Visual special needs '[]'  - 0 Assessment of Community assistance (transportation, D/C planning, etc.) '[]'  - 0 Additional assistance / Altered mentation '[]'  - 0 Support Surface(s) Assessment (bed, cushion, seat, etc.) INTERVENTIONS - Miscellaneous '[]'  - External ear exam 0 '[]'  - 0 Patient Transfer (multiple staff / Civil Service fast streamer / Similar devices) '[]'  - 0 Simple Staple / Suture removal (25 or less) '[]'  - 0 Complex Staple / Suture removal (26 or more) '[]'  - 0 Hypo/Hyperglycemic Management (do not check if billed separately) '[]'  - 0 Ankle /  Brachial Index (ABI) - do not check if billed separately Has the patient been seen at the hospital within the last three years: Yes  Total Score: 0 Level Of Care: ____ [name] ([id number]) Electronic Signature(s) Signed: 05/29/2022 4:28:34 PM By: Carlene Coria RN Entered By: Carlene Coria on 05/29/2022 08:34:42 [name] ([id number]) -------------------------------------------------------------------------------- Encounter Discharge Information Details Patient Name: [name] Date of Service: 05/29/2022 8:00 AM Medical Record Number: [id number] Patient Account Number: [id number] Date of Birth/Sex: 1986-03-08 (35 y.o. F) Treating RN: Carlene Coria Primary Care Cieara Stierwalt: Jac Canavan Other Clinician: Referring Limmie Schoenberg: Jac Canavan Treating Alveena Taira/Extender: Skipper Cliche in Treatment: 17 Encounter Discharge Information Items Post Procedure Vitals Discharge Condition: Stable Temperature (F): 98.4 Ambulatory Status: Wheelchair Pulse (bpm): 108 Discharge Destination: Home Respiratory Rate (breaths/min): 18 Transportation: Private Auto Blood Pressure (mmHg): 132/92 Accompanied By: self Schedule Follow-up Appointment: Yes Clinical Summary of Care: Electronic Signature(s) Signed: 05/29/2022 4:28:34 PM By: Carlene Coria RN Entered By: Carlene Coria on 05/29/2022 08:35:47 [name] ([id number]) -------------------------------------------------------------------------------- Lower Extremity Assessment Details Patient Name: [name] Date of Service: 05/29/2022 8:00 AM Medical Record Number: [id number] Patient Account Number: [id number] Date of Birth/Sex: 04/04/1986 (35 y.o. F) Treating RN: Carlene Coria Primary Care Sheyenne Konz: Jac Canavan Other Clinician: Referring Susannah Carbin: Jac Canavan Treating Eldor Conaway/Extender: Skipper Cliche in Treatment: 17 Electronic Signature(s) Signed: 05/29/2022 4:28:34 PM By: Carlene Coria RN Entered By: Carlene Coria on  05/29/2022 08:15:11 [name] ([id number]) -------------------------------------------------------------------------------- Multi Wound Chart Details Patient Name: [name] Date of Service: 05/29/2022 8:00 AM Medical Record Number: [id number] Patient Account Number: [id number] Date of Birth/Sex: [birth year] (35 y.o. F) Treating RN: Carlene Coria Primary Care Aijalon Demuro: Jac Canavan Other Clinician: Referring Valkyrie Guardiola: Jac Canavan Treating Tyrel Lex/Extender: Skipper Cliche in Treatment: 17 Vital Signs Height(in): 67 Pulse(bpm): 108 Weight(lbs): 219 Blood Pressure(mmHg): 132/92 Body Mass Index(BMI): 34.3 Temperature(F): 98.4 Respiratory Rate(breaths/min): 18 Photos: [N/A:N/A] Wound Location: Right Amputation Site - Below Knee N/A N/A Wounding Event: Gradually Appeared N/A N/A Primary Etiology: Abscess N/A N/A Comorbid History: Hypertension, Type II Diabetes N/A N/A Date Acquired: 01/14/2022 N/A N/A Weeks of Treatment: 17 N/A N/A Wound Status: Open N/A N/A Wound Recurrence: No N/A N/A Measurements L x W x D (cm) 1.5x2.6x0.1 N/A N/A Area (cm) : 3.063 N/A N/A Volume (cm) : 0.306 N/A N/A % Reduction in Area: 88.70% N/A N/A % Reduction in Volume: 99.70% N/A N/A Classification: Full Thickness With Exposed N/A N/A Support Structures Exudate Amount: Medium N/A N/A Exudate Type: Serosanguineous N/A N/A Exudate Color: red, brown N/A N/A Wound Margin: Flat and Intact N/A N/A Granulation Amount: Large (67-100%) N/A N/A Granulation Quality: Red N/A N/A Necrotic Amount: None Present (0%) N/A N/A Exposed Structures: Fat Layer (Subcutaneous Tissue): N/A N/A Yes Fascia: No Tendon: No Muscle: No Joint: No Bone: No Epithelialization: Small (1-33%) N/A N/A Treatment Notes Electronic Signature(s) Signed: 05/29/2022 4:28:34 PM By: Carlene Coria RN Entered By: Carlene Coria on 05/29/2022 08:15:23 [name]  ([id number]) -------------------------------------------------------------------------------- Multi-Disciplinary Care Plan Details Patient Name: [name] Date of Service: 05/29/2022 8:00 AM Medical Record Number: [id number] Patient Account Number: [id number] Date of Birth/Sex: 1985-12-17 (35 y.o. F) Treating RN: Carlene Coria Primary Care Carinna Newhart: Jac Canavan Other Clinician: Referring Marka Treloar: Jac Canavan Treating Rajinder Mesick/Extender: Skipper Cliche in Treatment: 17 Active Inactive Wound/Skin Impairment Nursing Diagnoses: Knowledge deficit related to ulceration/compromised skin integrity Goals: Patient/caregiver will verbalize understanding of skin care regimen Date Initiated: 01/27/2022 Target Resolution Date: 05/25/2022 Goal Status: Active Ulcer/skin breakdown will have a volume reduction of 30% by week 4 Date Initiated: 01/27/2022 Date Inactivated: 04/24/2022 Target Resolution Date: 03/29/2022 Goal Status: Met Ulcer/skin breakdown will have a volume reduction of 50% by week  8 Date Initiated: 01/27/2022 Date Inactivated: 04/24/2022 Target Resolution Date: 04/28/2022 Goal Status: Met Ulcer/skin breakdown will have a volume reduction of 80% by week 12 Date Initiated: 01/27/2022 Target Resolution Date: 05/29/2022 Goal Status: Active Ulcer/skin breakdown will heal within 14 weeks Date Initiated: 01/27/2022 Target Resolution Date: 06/29/2022 Goal Status: Active Interventions: Assess patient/caregiver ability to obtain necessary supplies Assess patient/caregiver ability to perform ulcer/skin care regimen upon admission and as needed Assess ulceration(s) every visit Notes: Electronic Signature(s) Signed: 05/29/2022 4:28:34 PM By: Carlene Coria RN Entered By: Carlene Coria on 05/29/2022 08:15:17 [name] ([id number]) -------------------------------------------------------------------------------- Pain Assessment Details Patient Name: [name] Date of Service:  05/29/2022 8:00 AM Medical Record Number: [id number] Patient Account Number: [id number] Date of Birth/Sex: 1986/06/09 (35 y.o. F) Treating RN: Carlene Coria Primary Care Leaf [employer]: Jac Canavan Other Clinician: Referring Stefannie Defeo: Jac Canavan Treating Keyerra Lamere/Extender: Skipper Cliche in Treatment: 17 Active Problems Location of Pain Severity and Description of Pain Patient Has Paino No Site Locations Pain Management and Medication Current Pain Management: Electronic Signature(s) Signed: 05/29/2022 4:28:34 PM By: Carlene Coria RN Entered By: Carlene Coria on 05/29/2022 08:07:40 [name] ([id number]) -------------------------------------------------------------------------------- Patient/Caregiver Education Details Patient Name: [name] Date of Service: 05/29/2022 8:00 AM Medical Record Number: [id number] Patient Account Number: [id number] Date of Birth/Gender: 1986-07-26 (35 y.o. F) Treating RN: Carlene Coria Primary Care Physician: Jac Canavan Other Clinician: Referring Physician: Jac Canavan Treating Physician/Extender: Skipper Cliche in Treatment: 63 Education Assessment Education Provided To: Patient Education Topics Provided Wound/Skin Impairment: Methods: Explain/Verbal Responses: State content correctly Electronic Signature(s) Signed: 05/29/2022 4:28:34 PM By: Carlene Coria RN Entered By: Carlene Coria on 05/29/2022 08:34:58 [name] ([id number]) -------------------------------------------------------------------------------- Wound Assessment Details Patient Name: [name] Date of Service: 05/29/2022 8:00 AM Medical Record Number: [id number] Patient Account Number: [id number] Date of Birth/Sex: 1986-06-21 (35 y.o. F) Treating RN: Carlene Coria Primary Care Alandra Sando: Jac Canavan Other Clinician: Referring Robel Wuertz: Jac Canavan Treating Valeda Corzine/Extender: Skipper Cliche in Treatment: 17 Wound Status Wound Number: 1 Primary  Etiology: Abscess Wound Location: Right Amputation Site - Below Knee Wound Status: Open Wounding Event: Gradually Appeared Comorbid History: Hypertension, Type II Diabetes Date Acquired: 01/14/2022 Weeks Of Treatment: 17 Clustered Wound: No Photos Wound Measurements Length: (cm) 1.5 Width: (cm) 2.6 Depth: (cm) 0.1 Area: (cm) 3.063 Volume: (cm) 0.306 % Reduction in Area: 88.7% % Reduction in Volume: 99.7% Epithelialization: Small (1-33%) Tunneling: No Undermining: No Wound Description Classification: Full Thickness With Exposed Support Structures Wound Margin: Flat and Intact Exudate Amount: Medium Exudate Type: Serosanguineous Exudate Color: red, brown Foul Odor After Cleansing: No Slough/Fibrino No Wound Bed Granulation Amount: Large (67-100%) Exposed Structure Granulation Quality: Red Fascia Exposed: No Necrotic Amount: None Present (0%) Fat Layer (Subcutaneous Tissue) Exposed: Yes Tendon Exposed: No Muscle Exposed: No Joint Exposed: No Bone Exposed: No Treatment Notes Wound #1 (Amputation Site - Below Knee) Wound Laterality: Right Cleanser Peri-Wound Care Topical Lindon, Aleyda ([id number]) Primary Dressing Prisma 4.34 (in) Discharge Instruction: Moisten w/normal saline or sterile water; Cover wound as directed. Do not remove from wound bed. Secondary Dressing ABD Pad 5x9 (in/in) Discharge Instruction: Cover with ABD pad Secured With [place] H Soft Cloth Surgical Tape, 2x2 (in/yd) Compression Wrap Compression Stockings Add-Ons Electronic Signature(s) Signed: 05/29/2022 4:28:34 PM By: Carlene Coria RN Entered By: Carlene Coria on 05/29/2022 08:13:38 [name] ([id number]) -------------------------------------------------------------------------------- Vitals Details Patient Name: [name] Date of Service: 05/29/2022 8:00 AM Medical Record Number: [id number] Patient Account Number: [id number] Date of Birth/Sex: Apr 04, 1986 (36  y.o.  F) Treating RN: Carlene Coria Primary Care Tiaria Biby: Jac Canavan Other Clinician: Referring Buffi Ewton: Jac Canavan Treating Jaylaa Gallion/Extender: Skipper Cliche in Treatment: 17 Vital Signs Time Taken: 08:06 Temperature (F): 98.4 Height (in): 67 Pulse (bpm): 108 Weight (lbs): 219 Respiratory Rate (breaths/min): 18 Body Mass Index (BMI): 34.3 Blood Pressure (mmHg): 132/92 Reference Range: 80 - 120 mg / dl Electronic Signature(s) Signed: 05/29/2022 4:28:34 PM By: Carlene Coria RN Entered By: Carlene Coria on 05/29/2022 08:07:31

## 2022-05-29 NOTE — Telephone Encounter (Signed)
LMTCB TO SCHEDULE APPT LETTER MAILED 

## 2022-05-29 NOTE — Telephone Encounter (Signed)
Je NTBS 30 days given 05/05/22

## 2022-06-01 DIAGNOSIS — Z89511 Acquired absence of right leg below knee: Secondary | ICD-10-CM | POA: Insufficient documentation

## 2022-06-06 ENCOUNTER — Encounter: Payer: Medicaid Other | Attending: Physician Assistant | Admitting: Physician Assistant

## 2022-06-06 DIAGNOSIS — I10 Essential (primary) hypertension: Secondary | ICD-10-CM | POA: Insufficient documentation

## 2022-06-06 DIAGNOSIS — E11622 Type 2 diabetes mellitus with other skin ulcer: Secondary | ICD-10-CM | POA: Insufficient documentation

## 2022-06-06 DIAGNOSIS — E1143 Type 2 diabetes mellitus with diabetic autonomic (poly)neuropathy: Secondary | ICD-10-CM | POA: Insufficient documentation

## 2022-06-06 DIAGNOSIS — L97812 Non-pressure chronic ulcer of other part of right lower leg with fat layer exposed: Secondary | ICD-10-CM | POA: Diagnosis not present

## 2022-06-06 DIAGNOSIS — X58XXXA Exposure to other specified factors, initial encounter: Secondary | ICD-10-CM | POA: Insufficient documentation

## 2022-06-06 DIAGNOSIS — T8131XA Disruption of external operation (surgical) wound, not elsewhere classified, initial encounter: Secondary | ICD-10-CM | POA: Diagnosis not present

## 2022-06-06 NOTE — Progress Notes (Addendum)
NEJLA, REASOR ([id number]) Visit Report for 06/06/2022 Arrival Information Details Patient Name: [name], [name] Date of Service: 06/06/2022 8:30 AM Medical Record Number: [id number] Patient Account Number: [id number] Date of Birth/Sex: [birth year] (36 y.o. F) Treating RN: Cornell Barman Primary Care Barett Whidbee: Jac Canavan Other Clinician: Referring Dunya Meiners: Jac Canavan Treating Darsi Tien/Extender: Skipper Cliche in Treatment: 18 Visit Information History Since Last Visit Added or deleted any medications: No Patient Arrived: Wheel Chair Has Dressing in Place as Prescribed: Yes Arrival Time: 08:30 Has Compression in Place as Prescribed: Yes Accompanied By: self Pain Present Now: No Transfer Assistance: None Patient Identification Verified: Yes Secondary Verification Process Completed: Yes Patient Requires Transmission-Based Precautions: No Patient Has Alerts: Yes Electronic Signature(s) Signed: 06/06/2022 4:48:34 PM By: Gretta Cool, BSN, RN, CWS, [name] RN, BSN Entered By: Gretta Cool, BSN, RN, CWS, [name] on 06/06/2022 08:33:32 [name] ([id number]) -------------------------------------------------------------------------------- Clinic Level of Care Assessment Details Patient Name: [name] Date of Service: 06/06/2022 8:30 AM Medical Record Number: [id number] Patient Account Number: [id number] Date of Birth/Sex: [birth year] (35 y.o. F) Treating RN: Cornell Barman Primary Care Maurisha Mongeau: Jac Canavan Other Clinician: Referring Tara Rud: Jac Canavan Treating Mykai Wendorf/Extender: Skipper Cliche in Treatment: 18 Clinic Level of Care Assessment Items TOOL 4 Quantity Score _0  - Use when only an EandM is performed on FOLLOW-UP visit 0 ASSESSMENTS - Nursing Assessment / Reassessment X - Reassessment of Co-morbidities (includes updates in patient status) 1 10 X- 1 5 Reassessment of Adherence to Treatment Plan ASSESSMENTS - Wound and Skin Assessment / Reassessment X - Simple Wound Assessment /  Reassessment - one wound 1 5 _1  - 0 Complex Wound Assessment / Reassessment - multiple wounds _2  - 0 Dermatologic / Skin Assessment (not related to wound area) ASSESSMENTS - Focused Assessment _3  - Circumferential Edema Measurements - multi extremities 0 _4  - 0 Nutritional Assessment / Counseling / Intervention _5  - 0 Lower Extremity Assessment (monofilament, tuning fork, pulses) _6  - 0 Peripheral Arterial Disease Assessment (using hand held doppler) ASSESSMENTS - Ostomy and/or Continence Assessment and Care _7  - Incontinence Assessment and Management 0 _8  - 0 Ostomy Care Assessment and Management (repouching, etc.) PROCESS - Coordination of Care X - Simple Patient / Family Education for ongoing care 1 15 _9  - 0 Complex (extensive) Patient / Family Education for ongoing care X- 1 10 Staff obtains Programmer, systems, Records, Test Results / Process Orders _10  - 0 Staff telephones HHA, Nursing Homes / Clarify orders / etc _11  - 0 Routine Transfer to another Facility (non-emergent condition) _12  - 0 Routine Hospital Admission (non-emergent condition) _13  - 0 New Admissions / Biomedical engineer / Ordering NPWT, Apligraf, etc. _14  - 0 Emergency Hospital Admission (emergent condition) X- 1 10 Simple Discharge Coordination _15  - 0 Complex (extensive) Discharge Coordination PROCESS - Special Needs _16  - Pediatric / Minor Patient Management 0 _17  - 0 Isolation Patient Management _18  - 0 Hearing / Language / Visual special needs _19  - 0 Assessment of Community assistance (transportation, D/C planning, etc.) _20  - 0 Additional assistance / Altered mentation _21  - 0 Support Surface(s) Assessment (bed, cushion, seat, etc.) INTERVENTIONS - Wound Cleansing / Measurement Denis, Floetta ([id number]) X- 1 5 Simple Wound Cleansing - one wound _22  - 0 Complex Wound Cleansing - multiple wounds X- 1 5 Wound Imaging (photographs - any number of wounds) _23  - 0 Wound Tracing (instead of  photographs) X- 1 5 Simple Wound Measurement - one wound _24  - 0 Complex Wound Measurement - multiple wounds INTERVENTIONS - Wound Dressings _25  - Small Wound Dressing  one or multiple wounds 0 X- 1 15 Medium Wound Dressing one or multiple wounds _0  - 0 Large Wound Dressing one or multiple wounds <LKGMWNUUVOZDGUYQ>_0<\/HKVQQVZDGLOVFIEP>_3  - 0 Application of Medications - topical <IRJJOACZYSAYTKZS>_0<\/FUXNATFTDDUKGURK>_2  - 0 Application of Medications - injection INTERVENTIONS - Miscellaneous _3  - External ear exam 0 _4  - 0 Specimen Collection (cultures, biopsies, blood, body fluids, etc.) _5  - 0 Specimen(s) / Culture(s) sent or taken to Lab for analysis _6  - 0 Patient Transfer (multiple staff / Harrel Lemon Lift / Similar devices) _7  - 0 Simple Staple / Suture removal (25 or less) _8  - 0 Complex Staple / Suture removal (26 or more) _9  - 0 Hypo / Hyperglycemic Management (close monitor of Blood Glucose) _10  - 0 Ankle / Brachial Index (ABI) - do not check if billed separately X- 1 5 Vital Signs Has the patient been seen at the hospital within the last three years: Yes Total Score: 90 Level Of Care: New/Established - Level 3 Electronic Signature(s) Signed: 06/08/2022 9:40:40 AM By: Gretta Cool, BSN, RN, CWS, [name] RN, BSN Entered By: Gretta Cool, BSN, RN, CWS, [name] on 06/08/2022 07:52:10 [name] ([id number]) -------------------------------------------------------------------------------- Encounter Discharge Information Details Patient Name: [name] Date of Service: 06/06/2022 8:30 AM Medical Record Number: [id number] Patient Account Number: [id number] Date of Birth/Sex: 1986-05-16 (35 y.o. F) Treating RN: Cornell Barman Primary Care Laryssa Hassing: Jac Canavan Other Clinician: Referring Shterna Laramee: Jac Canavan Treating Brynlyn Dade/Extender: Skipper Cliche in Treatment: 18 Encounter Discharge Information Items Discharge Condition: Stable Ambulatory Status: Ambulatory Discharge Destination: Home Transportation: Private Auto Schedule Follow-up Appointment:  Yes Clinical Summary of Care: Electronic Signature(s) Signed: 06/08/2022 7:53:15 AM By: Gretta Cool, BSN, RN, CWS, [name] RN, BSN Entered By: Gretta Cool, BSN, RN, CWS, [name] on 06/08/2022 07:53:15 [name] ([id number]) -------------------------------------------------------------------------------- Lower Extremity Assessment Details Patient Name: [name] Date of Service: 06/06/2022 8:30 AM Medical Record Number: [id number] Patient Account Number: [id number] Date of Birth/Sex: December 11, 1985 (35 y.o. F) Treating RN: Cornell Barman Primary Care Reeva Davern: Jac Canavan Other Clinician: Referring Zymere Patlan: Jac Canavan Treating Ameliya Nicotra/Extender: Skipper Cliche in Treatment: 18 Edema Assessment Assessed: [Left: No] [Right: Yes] Edema: [Left: N] [Right: o] Notes Right BKA Electronic Signature(s) Signed: 06/06/2022 4:48:34 PM By: Gretta Cool, BSN, RN, CWS, [name] RN, BSN Entered By: Gretta Cool, BSN, RN, CWS, [name] on 06/06/2022 08:38:46 [name] ([id number]) -------------------------------------------------------------------------------- Multi Wound Chart Details Patient Name: [name] Date of Service: 06/06/2022 8:30 AM Medical Record Number: [id number] Patient Account Number: [id number] Date of Birth/Sex: 1986-06-12 (35 y.o. F) Treating RN: Cornell Barman Primary Care Alexios Keown: Jac Canavan Other Clinician: Referring Celines Femia: Jac Canavan Treating Jesenya Bowditch/Extender: Skipper Cliche in Treatment: 18 Vital Signs Height(in): 67 Pulse(bpm): 111 Weight(lbs): 219 Blood Pressure(mmHg): 144/92 Body Mass Index(BMI): 34.3 Temperature(F): 98.5 Respiratory Rate(breaths/min): 18 Photos: [N/A:N/A] Wound Location: Right Amputation Site - Below Knee N/A N/A Wounding Event: Gradually Appeared N/A N/A Primary Etiology: Abscess N/A N/A Comorbid History: Hypertension, Type II Diabetes N/A N/A Date Acquired: 01/14/2022 N/A N/A Weeks of Treatment: 18 N/A N/A Wound Status: Open N/A N/A Wound Recurrence: No  N/A N/A Measurements L x W x D (cm) 1.5x2x0.1 N/A N/A Area (cm) : 2.356 N/A N/A Volume (cm) : 0.236 N/A N/A % Reduction in Area: 91.30% N/A N/A % Reduction in Volume: 99.80% N/A N/A Classification: Full Thickness With Exposed N/A N/A Support Structures Exudate Amount: Medium N/A N/A Exudate Type: Serosanguineous N/A N/A Exudate Color: red, brown N/A N/A Wound Margin: Flat and Intact N/A N/A Granulation Amount: Large (67-100%) N/A N/A Granulation Quality: Red N/A N/A Necrotic Amount: None  Present (0%) N/A N/A Exposed Structures: Fat Layer (Subcutaneous Tissue): N/A N/A Yes Fascia: No Tendon: No Muscle: No Joint: No Bone: No Epithelialization: Small (1-33%) N/A N/A Treatment Notes Electronic Signature(s) Signed: 06/06/2022 4:48:34 PM By: Gretta Cool, BSN, RN, CWS, [name] RN, BSN Entered By: Gretta Cool, BSN, RN, CWS, [name] on 06/06/2022 08:41:54 [name] ([id number]) -------------------------------------------------------------------------------- Multi-Disciplinary Care Plan Details Patient Name: [name] Date of Service: 06/06/2022 8:30 AM Medical Record Number: [id number] Patient Account Number: [id number] Date of Birth/Sex: Sep 30, 1986 (35 y.o. F) Treating RN: Cornell Barman Primary Care Bertine Schlottman: Jac Canavan Other Clinician: Referring Harrie Cazarez: Jac Canavan Treating Husna Krone/Extender: Skipper Cliche in Treatment: 18 Active Inactive Wound/Skin Impairment Nursing Diagnoses: Knowledge deficit related to ulceration/compromised skin integrity Goals: Patient/caregiver will verbalize understanding of skin care regimen Date Initiated: 01/27/2022 Target Resolution Date: 05/25/2022 Goal Status: Active Ulcer/skin breakdown will have a volume reduction of 30% by week 4 Date Initiated: 01/27/2022 Date Inactivated: 04/24/2022 Target Resolution Date: 03/29/2022 Goal Status: Met Ulcer/skin breakdown will have a volume reduction of 50% by week 8 Date Initiated: 01/27/2022 Date  Inactivated: 04/24/2022 Target Resolution Date: 04/28/2022 Goal Status: Met Ulcer/skin breakdown will have a volume reduction of 80% by week 12 Date Initiated: 01/27/2022 Target Resolution Date: 05/29/2022 Goal Status: Active Ulcer/skin breakdown will heal within 14 weeks Date Initiated: 01/27/2022 Target Resolution Date: 06/29/2022 Goal Status: Active Interventions: Assess patient/caregiver ability to obtain necessary supplies Assess patient/caregiver ability to perform ulcer/skin care regimen upon admission and as needed Assess ulceration(s) every visit Notes: Electronic Signature(s) Signed: 06/06/2022 4:48:34 PM By: Gretta Cool, BSN, RN, CWS, [name] RN, BSN Entered By: Gretta Cool, BSN, RN, CWS, [name] on 06/06/2022 08:41:41 [name] ([id number]) -------------------------------------------------------------------------------- Pain Assessment Details Patient Name: [name] Date of Service: 06/06/2022 8:30 AM Medical Record Number: [id number] Patient Account Number: [id number] Date of Birth/Sex: 29-Mar-1986 (35 y.o. F) Treating RN: Cornell Barman Primary Care Koven Belinsky: Jac Canavan Other Clinician: Referring Daysy Santini: Jac Canavan Treating Affan Callow/Extender: Skipper Cliche in Treatment: 18 Active Problems Location of Pain Severity and Description of Pain Patient Has Paino No Site Locations Pain Management and Medication Current Pain Management: Electronic Signature(s) Signed: 06/06/2022 4:48:34 PM By: Gretta Cool, BSN, RN, CWS, [name] RN, BSN Entered By: Gretta Cool, BSN, RN, CWS, [name] on 06/06/2022 08:34:19 [name] ([id number]) -------------------------------------------------------------------------------- Patient/Caregiver Education Details Patient Name: [name] Date of Service: 06/06/2022 8:30 AM Medical Record Number: [id number] Patient Account Number: [id number] Date of Birth/Gender: 09-27-1986 (35 y.o. F) Treating RN: Cornell Barman Primary Care Physician: Jac Canavan Other  Clinician: Referring Physician: Jac Canavan Treating Physician/Extender: Skipper Cliche in Treatment: 18 Education Assessment Education Provided To: Patient Education Topics Provided Wound/Skin Impairment: Handouts: Caring for Your Ulcer, Other: continue wound care as prescribed. Methods: Explain/Verbal Responses: State content correctly Electronic Signature(s) Signed: 06/08/2022 9:40:40 AM By: Gretta Cool, BSN, RN, CWS, [name] RN, BSN Entered By: Gretta Cool, BSN, RN, CWS, [name] on 06/08/2022 07:52:46 [name] ([id number]) -------------------------------------------------------------------------------- Wound Assessment Details Patient Name: [name] Date of Service: 06/06/2022 8:30 AM Medical Record Number: [id number] Patient Account Number: [id number] Date of Birth/Sex: [birth year] (35 y.o. F) Treating RN: Cornell Barman Primary Care Amiel Mccaffrey: Jac Canavan Other Clinician: Referring Mervil Wacker: Jac Canavan Treating Nova Schmuhl/Extender: Skipper Cliche in Treatment: 18 Wound Status Wound Number: 1 Primary Etiology: Abscess Wound Location: Right Amputation Site - Below Knee Wound Status: Open Wounding Event: Gradually Appeared Comorbid History: Hypertension, Type II Diabetes Date Acquired: 01/14/2022 Weeks Of Treatment: 18 Clustered Wound: No Photos Wound Measurements Length: (cm) 1.5 Width: (cm) 2 Depth: (cm) 0.1 Area: (cm)  2.356 Volume: (cm) 0.236 % Reduction in Area: 91.3% % Reduction in Volume: 99.8% Epithelialization: Small (1-33%) Tunneling: No Undermining: No Wound Description Classification: Full Thickness With Exposed Support Structures Wound Margin: Flat and Intact Exudate Amount: Medium Exudate Type: Serosanguineous Exudate Color: red, brown Foul Odor After Cleansing: No Slough/Fibrino No Wound Bed Granulation Amount: Large (67-100%) Exposed Structure Granulation Quality: Red Fascia Exposed: No Necrotic Amount: None Present (0%) Fat Layer  (Subcutaneous Tissue) Exposed: Yes Tendon Exposed: No Muscle Exposed: No Joint Exposed: No Bone Exposed: No Treatment Notes Wound #1 (Amputation Site - Below Knee) Wound Laterality: Right Cleanser Peri-Wound Care Topical Lyter, Cherita ([id number]) Primary Dressing Prisma 4.34 (in) Discharge Instruction: Moisten w/normal saline or sterile water; Cover wound as directed. Do not remove from wound bed. Secondary Dressing ABD Pad 5x9 (in/in) Discharge Instruction: Cover with ABD pad Secured With [place] H Soft Cloth Surgical Tape, 2x2 (in/yd) Compression Wrap Compression Stockings Add-Ons Electronic Signature(s) Signed: 06/06/2022 4:48:34 PM By: Gretta Cool, BSN, RN, CWS, [name] RN, BSN Entered By: Gretta Cool, BSN, RN, CWS, [name] on 06/06/2022 08:37:31 [name] ([id number]) -------------------------------------------------------------------------------- Vitals Details Patient Name: [name] Date of Service: 06/06/2022 8:30 AM Medical Record Number: [id number] Patient Account Number: [id number] Date of Birth/Sex: Apr 09, 1986 (35 y.o. F) Treating RN: Cornell Barman Primary Care Aldo Sondgeroth: Jac Canavan Other Clinician: Referring Takeya Marquis: Jac Canavan Treating Awad Gladd/Extender: Skipper Cliche in Treatment: 18 Vital Signs Time Taken: 08:33 Temperature (F): 98.5 Height (in): 67 Pulse (bpm): 111 Weight (lbs): 219 Respiratory Rate (breaths/min): 18 Body Mass Index (BMI): 34.3 Blood Pressure (mmHg): 144/92 Reference Range: 80 - 120 mg / dl Electronic Signature(s) Signed: 06/06/2022 4:48:34 PM By: Gretta Cool, BSN, RN, CWS, [name] RN, BSN Entered By: Gretta Cool, BSN, RN, CWS, [name] on 06/06/2022 08:33:51

## 2022-06-06 NOTE — Progress Notes (Addendum)
[name], Leiloni ([id number]) Visit Report for 06/06/2022 Chief Complaint Document Details Patient Name: [name], [name] Date of Service: 06/06/2022 8:30 AM Medical Record Number: [id number] Patient Account Number: [id number] Date of Birth/Sex: 1986/08/21 (36 y.o. F) Treating RN: Huel CoventryWoody, [name] Primary Care Provider: Lynnell ChadIjaola, Onyeje Other Clinician: Referring Provider: Lynnell ChadIjaola, Onyeje Treating Provider/Extender: Rowan BlaseStone, Khalil Szczepanik Weeks in Treatment: 18 Information Obtained from: Patient Chief Complaint Right BKA Amputation Site Disruption Electronic Signature(s) Signed: 06/06/2022 8:19:20 AM By: Lenda KelpStone III, Gabriel Paulding PA-C Entered By: Lenda KelpStone III, Kash Davie on 06/06/2022 08:19:20 [name], [name] ([id number]) -------------------------------------------------------------------------------- HPI Details Patient Name: [name], [name] Date of Service: 06/06/2022 8:30 AM Medical Record Number: [id number] Patient Account Number: [id number] Date of Birth/Sex: 1986/08/21 (35 y.o. F) Treating RN: Huel CoventryWoody, [name] Primary Care Provider: Lynnell ChadIjaola, Onyeje Other Clinician: Referring Provider: Lynnell ChadIjaola, Onyeje Treating Provider/Extender: Rowan BlaseStone, Phyllis Whitefield Weeks in Treatment: 18 History of Present Illness HPI Description: 01-27-2022 upon evaluation today patient appears to be doing somewhat poorly in regard to her right below-knee amputation. This unfortunately developed a abscess internally following the initial amputation which was January 2023. Subsequently she was getting very close to starting to work on the prosthesis when she had an area that somewhat bulged out ended up being an abscess and the incision and drainage was performed April 22. Subsequently she did have antibiotic beads placed in a wound VAC after this was cleared out. Her most recent reported hemoglobin A1c was 6.4. With that being said the patient tells me that her blood sugars seem to be under pretty good control as best she can tell at this point. She also tells me that the wound  VAC seems to be functioning appropriately although upon evaluation the did not appear to be anything tucked into the region of undermining and depth in the roughly 9-12 o'clock location based on what we saw today. This is also where the antibiotic beads are and I can see those in place. I do believe that she is getting need to have the wound VAC tucked down into this location. Patient does have a history of diabetes mellitus type 2, diabetic neuropathy, and hypertension. This initial amputation occurred as a result of her having sewing needles that actually went into her heel she did not know about it started having trouble with her heel and ended up with a significant infection which was stated to be necrotizing and she subsequently had to have emergency amputation. 02-13-2022 upon evaluation today patient appears to be doing well with regard to her wound. She has been tolerating the dressing changes without complication. The wound VAC does seem to be doing a great job. Fortunately I do not see any evidence of active infection locally or systemically at this time which is great news. She still has some of the antibiotic beads in the base of the wound. 02-20-2022 upon evaluation today patient appears to be doing well currently. There does not appear to be any evidence of active infection locally nor systemically which is great news and overall I am extremely pleased with where we stand today. I do believe that the wound VAC is doing a good job here and the color of the drainage is improved again were unsure as to whether this may have been the antibiotic beads or if there was something such as Pseudomonas causing the greenish discoloration to the drainage either way she is doing significantly better. 03-06-2022 upon evaluation today patient appears to be doing well with regard to her wound. The only issue I see is that she does have  a little bit of a pocket of fluid that collected in the deeper section  everything else is actually measuring smaller and looks better. This was a point where I really did expect this was been a turnaround and start doing a lot better. I think we are especially seeing that right now. 03-20-2022 upon evaluation today patient appears to be doing well with regard to her wound in fact this is actually significantly improved compared to last time I saw her. Fortunately I do not see any evidence of active infection at this time. [name] fevers, chills, nausea, vomiting, or diarrhea. 03-27-2022 upon evaluation today patient appears to be doing excellent in regard to her wound. This is showing signs of improvement which is great news and overall I am extremely pleased with where we stand currently. I do not see any evidence of active infection locally or systemically which is great news. [name] fevers, chills, nausea, vomiting, or diarrhea. 04-03-2022 upon evaluation today patient's wound is showing signs of significant improvement. I am still very pleased with where we stand and how things are going. I think that she is really making excellent progress which is great news. I do not see any signs of active infection at this time. 04-10-2022 upon evaluation today patient's wound actually showed signs of good granulation and epithelization at this point. Fortunately I do not see any evidence of active infection at this time which is great news and overall I am extremely pleased with where things stand currently. [name] fevers, chills, nausea, vomiting, or diarrhea. 04-17-2022 upon evaluation today patient appears to be doing well with regard to her wound she is actually showing signs of significant improvement which is great news. I do believe that she is making good progress with the current wound care measures. Fortunately I do not see any evidence of active infection locally or systemically which is great news. 7/24; right BKA site secondary to an abscess. Per our intake nurse wound VAC came off  2 weeks ago she has been using silver collagen. Nice improvements in wound dimensions. She does not have a prosthesis as of yet 05-01-2022 upon evaluation today patient appears to be doing well with regard to her wound in fact this is measuring significantly smaller and is really doing quite well. Fortunately I do not see any evidence of active infection locally or systemically at this time. 05-08-2022 upon evaluation today patient's wound is actually showing signs of excellent improvement. I am actually very pleased with where we stand I think that she is making great progress and I do not see any signs of active infection at this time which is great news. [name] fevers, chills, nausea, vomiting, or diarrhea. 05-15-2022 upon evaluation today patient appears to be doing well currently in regard to her wound. This is actually showing signs of excellent improvement is noted completely to the surface there is just a little bit of biofilm although we are can have to clear this away with sharp debridement as she is saline and gauze treatment is not enough to get this loosened today. 05-22-2022 upon evaluation today patient appears to be doing excellent in regard to her wound this is showing signs of excellent improvement the surgeon she saw just last week he was extremely pleased with how things were doing and in fact was shocked with the progress. 05-29-2022 upon evaluation today patient appears to be doing well currently in regard to her wound the wound does appear to have little bit of Cedillos, Aasiya ([id number])  slough and biofilm on the surface of the wound that would not clearway today but the good news is she seems to be doing excellent otherwise. There does not appear to be any signs of active infection locally or systemically at this time. 06-06-2022 upon evaluation today patient appears to be doing well with regard to her wound the amputation site right leg. She has been tolerating the dressing changes  without complication. Fortunately there does not appear to be any evidence of active infection locally or systemically at this time. Electronic Signature(s) Signed: 06/06/2022 9:01:36 AM By: Lenda Kelp PA-C Entered By: Lenda Kelp on 06/06/2022 09:01:36 [name] ([id number]) -------------------------------------------------------------------------------- Physical Exam Details Patient Name: [name] Date of Service: 06/06/2022 8:30 AM Medical Record Number: [id number] Patient Account Number: [id number] Date of Birth/Sex: [birth year] (35 y.o. F) Treating RN: Huel Coventry Primary Care Provider: Lynnell Chad Other Clinician: Referring Provider: Lynnell Chad Treating Provider/Extender: Rowan Blase in Treatment: 18 Constitutional Well-nourished and well-hydrated in [name] acute distress. Respiratory normal breathing without difficulty. Psychiatric this patient is able to make decisions and demonstrates good insight into disease process. Alert and Oriented x 3. pleasant and cooperative. Notes Upon inspection patient's wound bed actually showed signs of good granulation and epithelization currently. Overall I am extremely happy with how things appear and I think that she is making all some progress. Electronic Signature(s) Signed: 06/06/2022 9:01:50 AM By: Lenda Kelp PA-C Entered By: Lenda Kelp on 06/06/2022 09:01:50 [name] ([id number]) -------------------------------------------------------------------------------- Physician Orders Details Patient Name: [name] Date of Service: 06/06/2022 8:30 AM Medical Record Number: [id number] Patient Account Number: [id number] Date of Birth/Sex: 1986/05/20 (35 y.o. F) Treating RN: Huel Coventry Primary Care Provider: Lynnell Chad Other Clinician: Referring Provider: Lynnell Chad Treating Provider/Extender: Rowan Blase in Treatment: 10 Verbal / Phone Orders: [name] Diagnosis Coding ICD-10 Coding Code  Description E11.622 Type 2 diabetes mellitus with other skin ulcer T81.31XA Disruption of external operation (surgical) wound, not elsewhere classified, initial encounter L97.812 Non-pressure chronic ulcer of other part of right lower leg with fat layer exposed E11.43 Type 2 diabetes mellitus with diabetic autonomic (poly)neuropathy I10 Essential (primary) hypertension Follow-up Appointments o Return Appointment in 1 week. Bathing/ Shower/ Hygiene o May shower; gently cleanse wound with antibacterial soap, rinse and pat dry prior to dressing wounds Anesthetic (Use 'Patient Medications' Section for Anesthetic Order Entry) o Lidocaine applied to wound bed Edema Control - Lymphedema / Segmental Compressive Device / Other o Other: - stump shrinker Wound Treatment Wound #1 - Amputation Site - Below Knee Wound Laterality: Right Primary Dressing: Prisma 4.34 (in) (Generic) 3 x Per Week/30 Days Discharge Instructions: Moisten w/normal saline or sterile water; Cover wound as directed. Do not remove from wound bed. Secondary Dressing: ABD Pad 5x9 (in/in) (Generic) 3 x Per Week/30 Days Discharge Instructions: Cover with ABD pad Secured With: Medipore Tape - 65M Medipore H Soft Cloth Surgical Tape, 2x2 (in/yd) 3 x Per Week/30 Days Electronic Signature(s) Signed: 06/08/2022 7:51:28 AM By: Elliot Gurney, BSN, RN, CWS, [name] RN, BSN Signed: 06/08/2022 5:41:38 PM By: Lenda Kelp PA-C Entered By: Elliot Gurney, BSN, RN, CWS, [name] on 06/08/2022 07:51:28 [name] ([id number]) -------------------------------------------------------------------------------- Problem List Details Patient Name: [name] Date of Service: 06/06/2022 8:30 AM Medical Record Number: [id number] Patient Account Number: [id number] Date of Birth/Sex: November 24, 1985 (35 y.o. F) Treating RN: Huel Coventry Primary Care Provider: Lynnell Chad Other Clinician: Referring Provider: Lynnell Chad Treating Provider/Extender: Rowan Blase in  Treatment: 18 Active Problems ICD-10 Encounter  Code Description Active Date MDM Diagnosis E11.622 Type 2 diabetes mellitus with other skin ulcer 01/27/2022 [name] Yes T81.31XA Disruption of external operation (surgical) wound, not elsewhere 01/27/2022 [name] Yes classified, initial encounter L97.812 Non-pressure chronic ulcer of other part of right lower leg with fat layer 01/27/2022 [name] Yes exposed E11.43 Type 2 diabetes mellitus with diabetic autonomic (poly)neuropathy 01/27/2022 [name] Yes I10 Essential (primary) hypertension 01/27/2022 [name] Yes Inactive Problems Resolved Problems Electronic Signature(s) Signed: 06/06/2022 8:19:13 AM By: Lenda Kelp PA-C Entered By: Lenda Kelp on 06/06/2022 08:19:13 [name] ([id number]) -------------------------------------------------------------------------------- Progress Note Details Patient Name: [name] Date of Service: 06/06/2022 8:30 AM Medical Record Number: [id number] Patient Account Number: [id number] Date of Birth/Sex: 03/21/1986 (35 y.o. F) Treating RN: Huel Coventry Primary Care Provider: Lynnell Chad Other Clinician: Referring Provider: Lynnell Chad Treating Provider/Extender: Rowan Blase in Treatment: 18 Subjective Chief Complaint Information obtained from Patient Right BKA Amputation Site Disruption History of Present Illness (HPI) 01-27-2022 upon evaluation today patient appears to be doing somewhat poorly in regard to her right below-knee amputation. This unfortunately developed a abscess internally following the initial amputation which was January 2023. Subsequently she was getting very close to starting to work on the prosthesis when she had an area that somewhat bulged out ended up being an abscess and the incision and drainage was performed April 22. Subsequently she did have antibiotic beads placed in a wound VAC after this was cleared out. Her most recent reported hemoglobin A1c was 6.4. With that being said the  patient tells me that her blood sugars seem to be under pretty good control as best she can tell at this point. She also tells me that the wound VAC seems to be functioning appropriately although upon evaluation the did not appear to be anything tucked into the region of undermining and depth in the roughly 9-12 o'clock location based on what we saw today. This is also where the antibiotic beads are and I can see those in place. I do believe that she is getting need to have the wound VAC tucked down into this location. Patient does have a history of diabetes mellitus type 2, diabetic neuropathy, and hypertension. This initial amputation occurred as a result of her having sewing needles that actually went into her heel she did not know about it started having trouble with her heel and ended up with a significant infection which was stated to be necrotizing and she subsequently had to have emergency amputation. 02-13-2022 upon evaluation today patient appears to be doing well with regard to her wound. She has been tolerating the dressing changes without complication. The wound VAC does seem to be doing a great job. Fortunately I do not see any evidence of active infection locally or systemically at this time which is great news. She still has some of the antibiotic beads in the base of the wound. 02-20-2022 upon evaluation today patient appears to be doing well currently. There does not appear to be any evidence of active infection locally nor systemically which is great news and overall I am extremely pleased with where we stand today. I do believe that the wound VAC is doing a good job here and the color of the drainage is improved again were unsure as to whether this may have been the antibiotic beads or if there was something such as Pseudomonas causing the greenish discoloration to the drainage either way she is doing significantly better. 03-06-2022 upon evaluation today patient appears to be doing  well with regard to her wound. The only issue I see is that she does have a little bit of a pocket of fluid that collected in the deeper section everything else is actually measuring smaller and looks better. This was a point where I really did expect this was been a turnaround and start doing a lot better. I think we are especially seeing that right now. 03-20-2022 upon evaluation today patient appears to be doing well with regard to her wound in fact this is actually significantly improved compared to last time I saw her. Fortunately I do not see any evidence of active infection at this time. [name] fevers, chills, nausea, vomiting, or diarrhea. 03-27-2022 upon evaluation today patient appears to be doing excellent in regard to her wound. This is showing signs of improvement which is great news and overall I am extremely pleased with where we stand currently. I do not see any evidence of active infection locally or systemically which is great news. [name] fevers, chills, nausea, vomiting, or diarrhea. 04-03-2022 upon evaluation today patient's wound is showing signs of significant improvement. I am still very pleased with where we stand and how things are going. I think that she is really making excellent progress which is great news. I do not see any signs of active infection at this time. 04-10-2022 upon evaluation today patient's wound actually showed signs of good granulation and epithelization at this point. Fortunately I do not see any evidence of active infection at this time which is great news and overall I am extremely pleased with where things stand currently. [name] fevers, chills, nausea, vomiting, or diarrhea. 04-17-2022 upon evaluation today patient appears to be doing well with regard to her wound she is actually showing signs of significant improvement which is great news. I do believe that she is making good progress with the current wound care measures. Fortunately I do not see any evidence of  active infection locally or systemically which is great news. 7/24; right BKA site secondary to an abscess. Per our intake nurse wound VAC came off 2 weeks ago she has been using silver collagen. Nice improvements in wound dimensions. She does not have a prosthesis as of yet 05-01-2022 upon evaluation today patient appears to be doing well with regard to her wound in fact this is measuring significantly smaller and is really doing quite well. Fortunately I do not see any evidence of active infection locally or systemically at this time. 05-08-2022 upon evaluation today patient's wound is actually showing signs of excellent improvement. I am actually very pleased with where we stand I think that she is making great progress and I do not see any signs of active infection at this time which is great news. [name] fevers, chills, nausea, vomiting, or diarrhea. 05-15-2022 upon evaluation today patient appears to be doing well currently in regard to her wound. This is actually showing signs of excellent improvement is noted completely to the surface there is just a little bit of biofilm although we are can have to clear this away with sharp debridement as she is saline and gauze treatment is not enough to get this loosened today. [name], [name] ([id number]) 05-22-2022 upon evaluation today patient appears to be doing excellent in regard to her wound this is showing signs of excellent improvement the surgeon she saw just last week he was extremely pleased with how things were doing and in fact was shocked with the progress. 05-29-2022 upon evaluation today patient appears to be doing well  currently in regard to her wound the wound does appear to have little bit of slough and biofilm on the surface of the wound that would not clearway today but the good news is she seems to be doing excellent otherwise. There does not appear to be any signs of active infection locally or systemically at this time. 06-06-2022 upon  evaluation today patient appears to be doing well with regard to her wound the amputation site right leg. She has been tolerating the dressing changes without complication. Fortunately there does not appear to be any evidence of active infection locally or systemically at this time. Objective Constitutional Well-nourished and well-hydrated in [name] acute distress. Vitals Time Taken: 8:33 AM, Height: 67 in, Weight: 219 lbs, BMI: 34.3, Temperature: 98.5 F, Pulse: 111 bpm, Respiratory Rate: 18 breaths/min, Blood Pressure: 144/92 mmHg. Respiratory normal breathing without difficulty. Psychiatric this patient is able to make decisions and demonstrates good insight into disease process. Alert and Oriented x 3. pleasant and cooperative. General Notes: Upon inspection patient's wound bed actually showed signs of good granulation and epithelization currently. Overall I am extremely happy with how things appear and I think that she is making all some progress. Integumentary (Hair, Skin) Wound #1 status is Open. Original cause of wound was Gradually Appeared. The date acquired was: 01/14/2022. The wound has been in treatment 18 weeks. The wound is located on the Right Amputation Site - Below Knee. The wound measures 1.5cm length x 2cm width x 0.1cm depth; 2.356cm^2 area and 0.236cm^3 volume. There is Fat Layer (Subcutaneous Tissue) exposed. There is [name] tunneling or undermining noted. There is a medium amount of serosanguineous drainage noted. The wound margin is flat and intact. There is large (67-100%) red granulation within the wound bed. There is [name] necrotic tissue within the wound bed. Assessment Active Problems ICD-10 Type 2 diabetes mellitus with other skin ulcer Disruption of external operation (surgical) wound, not elsewhere classified, initial encounter Non-pressure chronic ulcer of other part of right lower leg with fat layer exposed Type 2 diabetes mellitus with diabetic autonomic  (poly)neuropathy Essential (primary) hypertension Plan 1. I am good recommend that we go ahead and continue with the wound care measures as before and the patient is in agreement with plan this includes the use of the silver collagen which I think is doing an awesome job. 2. Also can recommend we continue to monitor for any signs of worsening or infection anything changes she should contact the office and let me know. 3. She is very excited she got a prescription for the prosthesis for the right leg from Dr. Allena Katz last week. She is ready to get things moving but of course we go to get this closed before she can proceed. [name], [name] ([id number]) We will see patient back for reevaluation in 1 week here in the clinic. If anything worsens or changes patient will contact our office for additional recommendations. Electronic Signature(s) Signed: 06/06/2022 9:02:29 AM By: Lenda Kelp PA-C Entered By: Lenda Kelp on 06/06/2022 09:02:29 [name] ([id number]) -------------------------------------------------------------------------------- SuperBill Details Patient Name: [name] Date of Service: 06/06/2022 Medical Record Number: [id number] Patient Account Number: [id number] Date of Birth/Sex: 20-Sep-1986 (35 y.o. F) Treating RN: Huel Coventry Primary Care Provider: Lynnell Chad Other Clinician: Referring Provider: Lynnell Chad Treating Provider/Extender: Rowan Blase in Treatment: 18 Diagnosis Coding ICD-10 Codes Code Description E11.622 Type 2 diabetes mellitus with other skin ulcer T81.31XA Disruption of external operation (surgical) wound, not elsewhere classified, initial encounter L97.812 Non-pressure chronic  ulcer of other part of right lower leg with fat layer exposed E11.43 Type 2 diabetes mellitus with diabetic autonomic (poly)neuropathy I10 Essential (primary) hypertension Facility Procedures CPT4 Code: 19147829 Description: 99213 - WOUND CARE VISIT-LEV 3 EST  PT Modifier: Quantity: 1 Physician Procedures CPT4 Code: 5621308 Description: 99213 - WC PHYS LEVEL 3 - EST PT Modifier: Quantity: 1 CPT4 Code: Description: ICD-10 Diagnosis Description E11.622 Type 2 diabetes mellitus with other skin ulcer T81.31XA Disruption of external operation (surgical) wound, not elsewhere classifi L97.812 Non-pressure 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

## 2022-06-12 ENCOUNTER — Encounter: Payer: Medicaid Other | Admitting: Physician Assistant

## 2022-06-12 DIAGNOSIS — E11622 Type 2 diabetes mellitus with other skin ulcer: Secondary | ICD-10-CM | POA: Diagnosis not present

## 2022-06-12 NOTE — Progress Notes (Addendum)
GREENLEY, MARTONE ([id number]) Visit Report for 06/12/2022 Chief Complaint Document Details Patient Name: [name], [name] Date of Service: 06/12/2022 8:00 AM Medical Record Number: [id number] Patient Account Number: [id number] Date of Birth/Sex: [birth year] (36 y.o. F) Treating RN: Yevonne Pax Primary Care Provider: Lynnell Chad Other Clinician: Referring Provider: Lynnell Chad Treating Provider/Extender: Rowan Blase in Treatment: 19 Information Obtained from: Patient Chief Complaint Right BKA Amputation Site Disruption Electronic Signature(s) Signed: 06/12/2022 8:17:44 AM By: Lenda Kelp PA-C Entered By: Lenda Kelp on 06/12/2022 08:17:44 [name] ([id number]) -------------------------------------------------------------------------------- Debridement Details Patient Name: [name] Date of Service: 06/12/2022 8:00 AM Medical Record Number: [id number] Patient Account Number: [id number] Date of Birth/Sex: September 12, 1986 (35 y.o. F) Treating RN: Yevonne Pax Primary Care Provider: Lynnell Chad Other Clinician: Referring Provider: Lynnell Chad Treating Provider/Extender: Rowan Blase in Treatment: 19 Debridement Performed for Wound #1 Right Amputation Site - Below Knee Assessment: Performed By: Physician Nelida Meuse., PA-C Debridement Type: Debridement Level of Consciousness (Pre- Awake and Alert procedure): Pre-procedure Verification/Time Out Yes - 08:21 Taken: Start Time: 08:21 Total Area Debrided (L x W): 1 (cm) x 2 (cm) = 2 (cm) Tissue and other material Viable, Non-Viable, Slough, Subcutaneous, Skin: Dermis , Skin: Epidermis, Slough debrided: Level: Skin/Subcutaneous Tissue Debridement Description: Excisional Instrument: Curette Bleeding: Minimum Hemostasis Achieved: Pressure End Time: 08:23 Procedural Pain: 0 Post Procedural Pain: 0 Response to Treatment: Procedure was tolerated well Level of Consciousness (Post- Awake and  Alert procedure): Post Debridement Measurements of Total Wound Length: (cm) 1 Width: (cm) 2 Depth: (cm) 0.1 Volume: (cm) 0.157 Character of Wound/Ulcer Post Debridement: Improved Post Procedure Diagnosis Same as Pre-procedure Electronic Signature(s) Signed: 06/12/2022 8:31:47 AM By: Lenda Kelp PA-C Signed: 06/12/2022 4:14:55 PM By: Yevonne Pax RN Entered By: Yevonne Pax on 06/12/2022 08:22:19 [name] ([id number]) -------------------------------------------------------------------------------- HPI Details Patient Name: [name] Date of Service: 06/12/2022 8:00 AM Medical Record Number: [id number] Patient Account Number: [id number] Date of Birth/Sex: 11/28/1985 (35 y.o. F) Treating RN: Yevonne Pax Primary Care Provider: Lynnell Chad Other Clinician: Referring Provider: Lynnell Chad Treating Provider/Extender: Rowan Blase in Treatment: 19 History of Present Illness HPI Description: 01-27-2022 upon evaluation today patient appears to be doing somewhat poorly in regard to her right below-knee amputation. This unfortunately developed a abscess internally following the initial amputation which was January 2023. Subsequently she was getting very close to starting to work on the prosthesis when she had an area that somewhat bulged out ended up being an abscess and the incision and drainage was performed April 22. Subsequently she did have antibiotic beads placed in a wound VAC after this was cleared out. Her most recent reported hemoglobin A1c was 6.4. With that being said the patient tells me that her blood sugars seem to be under pretty good control as best she can tell at this point. She also tells me that the wound VAC seems to be functioning appropriately although upon evaluation the did not appear to be anything tucked into the region of undermining and depth in the roughly 9-12 o'clock location based on what we saw today. This is also where the antibiotic  beads are and I can see those in place. I do believe that she is getting need to have the wound VAC tucked down into this location. Patient does have a history of diabetes mellitus type 2, diabetic neuropathy, and hypertension. This initial amputation occurred as a result of her having sewing needles that actually went into her heel she did not know about  it started having trouble with her heel and ended up with a significant infection which was stated to be necrotizing and she subsequently had to have emergency amputation. 02-13-2022 upon evaluation today patient appears to be doing well with regard to her wound. She has been tolerating the dressing changes without complication. The wound VAC does seem to be doing a great job. Fortunately I do not see any evidence of active infection locally or systemically at this time which is great news. She still has some of the antibiotic beads in the base of the wound. 02-20-2022 upon evaluation today patient appears to be doing well currently. There does not appear to be any evidence of active infection locally nor systemically which is great news and overall I am extremely pleased with where we stand today. I do believe that the wound VAC is doing a good job here and the color of the drainage is improved again were unsure as to whether this may have been the antibiotic beads or if there was something such as Pseudomonas causing the greenish discoloration to the drainage either way she is doing significantly better. 03-06-2022 upon evaluation today patient appears to be doing well with regard to her wound. The only issue I see is that she does have a little bit of a pocket of fluid that collected in the deeper section everything else is actually measuring smaller and looks better. This was a point where I really did expect this was been a turnaround and start doing a lot better. I think we are especially seeing that right now. 03-20-2022 upon evaluation today  patient appears to be doing well with regard to her wound in fact this is actually significantly improved compared to last time I saw her. Fortunately I do not see any evidence of active infection at this time. No fevers, chills, nausea, vomiting, or diarrhea. 03-27-2022 upon evaluation today patient appears to be doing excellent in regard to her wound. This is showing signs of improvement which is great news and overall I am extremely pleased with where we stand currently. I do not see any evidence of active infection locally or systemically which is great news. No fevers, chills, nausea, vomiting, or diarrhea. 04-03-2022 upon evaluation today patient's wound is showing signs of significant improvement. I am still very pleased with where we stand and how things are going. I think that she is really making excellent progress which is great news. I do not see any signs of active infection at this time. 04-10-2022 upon evaluation today patient's wound actually showed signs of good granulation and epithelization at this point. Fortunately I do not see any evidence of active infection at this time which is great news and overall I am extremely pleased with where things stand currently. No fevers, chills, nausea, vomiting, or diarrhea. 04-17-2022 upon evaluation today patient appears to be doing well with regard to her wound she is actually showing signs of significant improvement which is great news. I do believe that she is making good progress with the current wound care measures. Fortunately I do not see any evidence of active infection locally or systemically which is great news. 7/24; right BKA site secondary to an abscess. Per our intake nurse wound VAC came off 2 weeks ago she has been using silver collagen. Nice improvements in wound dimensions. She does not have a prosthesis as of yet 05-01-2022 upon evaluation today patient appears to be doing well with regard to her wound in fact this is measuring  significantly smaller and is really doing quite well. Fortunately I do not see any evidence of active infection locally or systemically at this time. 05-08-2022 upon evaluation today patient's wound is actually showing signs of excellent improvement. I am actually very pleased with where we stand I think that she is making great progress and I do not see any signs of active infection at this time which is great news. No fevers, chills, nausea, vomiting, or diarrhea. 05-15-2022 upon evaluation today patient appears to be doing well currently in regard to her wound. This is actually showing signs of excellent improvement is noted completely to the surface there is just a little bit of biofilm although we are can have to clear this away with sharp debridement as she is saline and gauze treatment is not enough to get this loosened today. 05-22-2022 upon evaluation today patient appears to be doing excellent in regard to her wound this is showing signs of excellent improvement the surgeon she saw just last week he was extremely pleased with how things were doing and in fact was shocked with the progress. 05-29-2022 upon evaluation today patient appears to be doing well currently in regard to her wound the wound does appear to have little bit of [name], [name] ([id number]) slough and biofilm on the surface of the wound that would not clearway today but the good news is she seems to be doing excellent otherwise. There does not appear to be any signs of active infection locally or systemically at this time. 06-06-2022 upon evaluation today patient appears to be doing well with regard to her wound the amputation site right leg. She has been tolerating the dressing changes without complication. Fortunately there does not appear to be any evidence of active infection locally or systemically at this time. 06-12-2022 upon evaluation today patient appears to be doing well currently in regard to her wound. She is actually  showing signs of improvement there is some slough and biofilm noted on the surface of the wound. Fortunately there does not appear to be any signs of active infection locally or systemically at this time which is great news. No fevers, chills, nausea, vomiting, or diarrhea. Electronic Signature(s) Signed: 06/12/2022 8:27:40 AM By: Worthy Keeler PA-C Entered By: Worthy Keeler on 06/12/2022 08:27:40 [name] ([id number]) -------------------------------------------------------------------------------- Physical Exam Details Patient Name: [name] Date of Service: 06/12/2022 8:00 AM Medical Record Number: [id number] Patient Account Number: [id number] Date of Birth/Sex: 17-Mar-1986 (35 y.o. F) Treating RN: Carlene Coria Primary Care Provider: Jac Canavan Other Clinician: Referring Provider: Jac Canavan Treating Provider/Extender: Skipper Cliche in Treatment: 76 Constitutional Well-nourished and well-hydrated in no acute distress. Respiratory normal breathing without difficulty. Psychiatric this patient is able to make decisions and demonstrates good insight into disease process. Alert and Oriented x 3. pleasant and cooperative. Notes Upon inspection patient's wound bed actually showed signs of good granulation and epithelization at this point. Fortunately I see no evidence of active infection at this time which is great news and overall I am extremely pleased with where we are today. I did perform sharp debridement of clearway some of the slough and biofilm today she tolerated that without complication. Electronic Signature(s) Signed: 06/12/2022 8:28:10 AM By: Worthy Keeler PA-C Entered By: Worthy Keeler on 06/12/2022 08:28:10 [name] ([id number]) -------------------------------------------------------------------------------- Physician Orders Details Patient Name: [name] Date of Service: 06/12/2022 8:00 AM Medical Record Number: [id number] Patient  Account Number: [id number] Date of Birth/Sex: November 10, 1985 (35 y.o. F) Treating RN: Epps,  Morey Hummingbird Primary Care Provider: Jac Canavan Other Clinician: Referring Provider: Jac Canavan Treating Provider/Extender: Skipper Cliche in Treatment: 15 Verbal / Phone Orders: No Diagnosis Coding Follow-up Appointments o Return Appointment in 1 week. Bathing/ Shower/ Hygiene o May shower; gently cleanse wound with antibacterial soap, rinse and pat dry prior to dressing wounds Anesthetic (Use 'Patient Medications' Section for Anesthetic Order Entry) o Lidocaine applied to wound bed Edema Control - Lymphedema / Segmental Compressive Device / Other o Other: - stump shrinker Wound Treatment Wound #1 - Amputation Site - Below Knee Wound Laterality: Right Primary Dressing: Prisma 4.34 (in) (Generic) 3 x Per Week/30 Days Discharge Instructions: Moisten w/normal saline or sterile water; Cover wound as directed. Do not remove from wound bed. Secondary Dressing: Gauze 3 x Per Week/30 Days Secured With: Medipore Tape - 5M Medipore H Soft Cloth Surgical Tape, 2x2 (in/yd) 3 x Per Week/30 Days Electronic Signature(s) Signed: 06/12/2022 8:31:47 AM By: Worthy Keeler PA-C Signed: 06/12/2022 4:14:55 PM By: Carlene Coria RN Entered By: Carlene Coria on 06/12/2022 08:31:29 [name] ([id number]) -------------------------------------------------------------------------------- Problem List Details Patient Name: [name] Date of Service: 06/12/2022 8:00 AM Medical Record Number: [id number] Patient Account Number: [id number] Date of Birth/Sex: January 16, 1986 (35 y.o. 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 01/27/2022 No Yes exposed E11.43 Type 2 diabetes mellitus with diabetic autonomic (poly)neuropathy 01/27/2022 No Yes I10 Essential (primary) hypertension 01/27/2022 No Yes Inactive Problems Resolved Problems Electronic Signature(s) Signed: 06/12/2022 8:17:41 AM By: Worthy Keeler PA-C Entered By: Worthy Keeler on 06/12/2022 08:17:41 [name] ([id number]) -------------------------------------------------------------------------------- Progress Note Details Patient Name: [name] Date of Service: 06/12/2022 8:00 AM Medical Record Number: [id number] Patient Account Number: [id number] Date of Birth/Sex: 1986/08/10 (35 y.o. F) Treating RN: Carlene Coria Primary Care Provider: Jac Canavan Other Clinician: Referring Provider: Jac Canavan Treating Provider/Extender: Skipper Cliche in Treatment: 19 Subjective Chief Complaint Information obtained from Patient Right BKA Amputation Site Disruption History of Present Illness (HPI) 01-27-2022 upon evaluation today patient appears to be doing somewhat poorly in regard to her right below-knee amputation. This unfortunately developed a abscess internally following the initial amputation which was January 2023. Subsequently she was getting very close to starting to work on the prosthesis when she had an area that somewhat bulged out ended up being an abscess and the incision and drainage was performed April 22. Subsequently she did have antibiotic beads placed in a wound VAC after this was cleared out. Her most recent reported hemoglobin A1c was 6.4. With that being said the patient tells me that her blood sugars seem to be under pretty good control as best she can tell at this point. She also tells me that the wound VAC seems to  be functioning appropriately although upon evaluation the did not appear to be anything tucked into the region of undermining and depth in the roughly 9-12 o'clock location based on what we saw today. This is also where the antibiotic beads are and I can see those in place. I do believe that she is getting need to have the wound VAC tucked down into this  location. Patient does have a history of diabetes mellitus type 2, diabetic neuropathy, and hypertension. This initial amputation occurred as a result of her having sewing needles that actually went into her heel she did not know about it started having trouble with her heel and ended up with a significant infection which was stated to be necrotizing and she subsequently had to have emergency amputation. 02-13-2022 upon evaluation today patient appears to be doing well with regard to her wound. She has been tolerating the dressing changes without complication. The wound VAC does seem to be doing a great job. Fortunately I do not see any evidence of active infection locally or systemically at this time which is great news. She still has some of the antibiotic beads in the base of the wound. 02-20-2022 upon evaluation today patient appears to be doing well currently. There does not appear to be any evidence of active infection locally nor systemically which is great news and overall I am extremely pleased with where we stand today. I do believe that the wound VAC is doing a good job here and the color of the drainage is improved again were unsure as to whether this may have been the antibiotic beads or if there was something such as Pseudomonas causing the greenish discoloration to the drainage either way she is doing significantly better. 03-06-2022 upon evaluation today patient appears to be doing well with regard to her wound. The only issue I see is that she does have a little bit of a pocket of fluid that collected in the deeper section everything else  is actually measuring smaller and looks better. This was a point where I really did expect this was been a turnaround and start doing a lot better. I think we are especially seeing that right now. 03-20-2022 upon evaluation today patient appears to be doing well with regard to her wound in fact this is actually significantly improved compared to last time I saw her. Fortunately I do not see any evidence of active infection at this time. No fevers, chills, nausea, vomiting, or diarrhea. 03-27-2022 upon evaluation today patient appears to be doing excellent in regard to her wound. This is showing signs of improvement which is great news and overall I am extremely pleased with where we stand currently. I do not see any evidence of active infection locally or systemically which is great news. No fevers, chills, nausea, vomiting, or diarrhea. 04-03-2022 upon evaluation today patient's wound is showing signs of significant improvement. I am still very pleased with where we stand and how things are going. I think that she is really making excellent progress which is great news. I do not see any signs of active infection at this time. 04-10-2022 upon evaluation today patient's wound actually showed signs of good granulation and epithelization at this point. Fortunately I do not see any evidence of active infection at this time which is great news and overall I am extremely pleased with where things stand currently. No fevers, chills, nausea, vomiting, or diarrhea. 04-17-2022 upon evaluation today patient appears to be doing well with regard to her wound she is actually showing signs of significant improvement which is great news. I do believe that she is making good progress with the current wound care measures. Fortunately I do not see any evidence of active infection locally or systemically which is great news. 7/24; right BKA site secondary to an abscess. Per our intake nurse wound VAC came off 2 weeks ago she  has been using silver collagen. Nice improvements in wound dimensions. She does not have a prosthesis as of yet 05-01-2022 upon evaluation today patient appears to be doing well with regard to her wound in fact this is measuring significantly smaller and is really doing quite well. Fortunately I do not see any evidence of active infection locally or systemically at this time. 05-08-2022 upon evaluation today patient's wound is actually showing signs of excellent improvement. I am actually very pleased with where we stand I think that she is making great progress and I do not see any signs of active infection at this time which is great news. No fevers, chills, nausea, vomiting, or diarrhea. 05-15-2022 upon evaluation today patient appears to be doing well currently in regard to her wound. This is actually showing signs of excellent improvement is noted completely to the surface there is just a little bit of biofilm although we are can have to clear this away with sharp debridement as she is saline and gauze treatment is not enough to get this loosened today. [name], [name] ([id number]) 05-22-2022 upon evaluation today patient appears to be doing excellent in regard to her wound this is showing signs of excellent improvement the surgeon she saw just last week he was extremely pleased with how things were doing and in fact was shocked with the progress. 05-29-2022 upon evaluation today patient appears to be doing well currently in regard to her wound the wound does appear to have little bit of slough and biofilm on the surface of the wound that would not clearway today but the good news is she seems to be doing excellent otherwise. There does not appear to be any signs of active infection locally or systemically at this time. 06-06-2022 upon evaluation today patient appears to be doing well with regard to her wound the amputation site right leg. She has been tolerating the dressing changes without  complication. Fortunately there does not appear to be any evidence of active infection locally or systemically at this time. 06-12-2022 upon evaluation today patient appears to be doing well currently in regard to her wound. She is actually showing signs of improvement there is some slough and biofilm noted on the surface of the wound. Fortunately there does not appear to be any signs of active infection locally or systemically at this time which is great news. No fevers, chills, nausea, vomiting, or diarrhea. Objective Constitutional Well-nourished and well-hydrated in no acute distress. Vitals Time Taken: 8:06 AM, Height: 67 in, Weight: 219 lbs, BMI: 34.3, Temperature: 98.3 F, Pulse: 114 bpm, Respiratory Rate: 18 breaths/min, Blood Pressure: 138/91 mmHg. Respiratory normal breathing without difficulty. Psychiatric this patient is able to make decisions and demonstrates good insight into disease process. Alert and Oriented x 3. pleasant and cooperative. General Notes: Upon inspection patient's wound bed actually showed signs of good granulation and epithelization at this point. Fortunately I see no evidence of active infection at this time which is great news and overall I am extremely pleased with where we are today. I did perform sharp debridement of clearway some of the slough and biofilm today she tolerated that without complication. Integumentary (Hair, Skin) Wound #1 status is Open. Original cause of wound was Gradually Appeared. The date acquired was: 01/14/2022. The wound has been in treatment 19 weeks. The wound is located on the Right Amputation Site - Below Knee. The wound measures 1cm length x 2cm width x 0.1cm depth; 1.571cm^2 area and 0.157cm^3 volume. There is Fat Layer (Subcutaneous  Tissue) exposed. There is no tunneling or undermining noted. There is a medium amount of serosanguineous drainage noted. The wound margin is flat and intact. There is large (67-100%) red granulation  within the wound bed. There is no necrotic tissue within the wound bed. Assessment Active Problems ICD-10 Type 2 diabetes mellitus with other skin ulcer Disruption of external operation (surgical) wound, not elsewhere classified, initial encounter Non-pressure chronic ulcer of other part of right lower leg with fat layer exposed Type 2 diabetes mellitus with diabetic autonomic (poly)neuropathy Essential (primary) hypertension Procedures Wound #1 Pre-procedure diagnosis of Wound #1 is an Abscess located on the Right Amputation Site - Below Knee . There was a Excisional Skin/Subcutaneous Tissue Debridement with a total area of 2 sq cm performed by Nelida Meuse., PA-C. With the following instrument(s): Curette to remove Viable and Non-Viable tissue/material. Material removed includes Subcutaneous Tissue, Slough, Skin: Dermis, and Skin: Epidermis. No Valade, Jenalyn ([id number]) specimens were taken. A time out was conducted at 08:21, prior to the start of the procedure. A Minimum amount of bleeding was controlled with Pressure. The procedure was tolerated well with a pain level of 0 throughout and a pain level of 0 following the procedure. Post Debridement Measurements: 1cm length x 2cm width x 0.1cm depth; 0.157cm^3 volume. Character of Wound/Ulcer Post Debridement is improved. Post procedure Diagnosis Wound #1: Same as Pre-Procedure Plan Follow-up Appointments: Return Appointment in 1 week. Bathing/ Shower/ Hygiene: May shower; gently cleanse wound with antibacterial soap, rinse and pat dry prior to dressing wounds Anesthetic (Use 'Patient Medications' Section for Anesthetic Order Entry): Lidocaine applied to wound bed Edema Control - Lymphedema / Segmental Compressive Device / Other: Other: - stump shrinker WOUND #1: - Amputation Site - Below Knee Wound Laterality: Right Primary Dressing: Prisma 4.34 (in) (Generic) 3 x Per Week/30 Days Discharge Instructions: Moisten w/normal saline  or sterile water; Cover wound as directed. Do not remove from wound bed. Secondary Dressing: ABD Pad 5x9 (in/in) (Generic) 3 x Per Week/30 Days Discharge Instructions: Cover with ABD pad Secured With: Medipore Tape - 60M Medipore H Soft Cloth Surgical Tape, 2x2 (in/yd) 3 x Per Week/30 Days 1. I am good recommend that we go ahead and continue with the recommendation for wound care measures as before and the patient is in agreement with plan. This includes the use of the silver collagen dressing which I think is doing decently well at this point. 2. Also can recommend that we have the patient continue with an ABD pad to cover followed by tape to secure in place. We will see patient back for reevaluation in 1 week here in the clinic. If anything worsens or changes patient will contact our office for additional recommendations. Electronic Signature(s) Signed: 06/12/2022 8:28:43 AM By: Lenda Kelp PA-C Entered By: Lenda Kelp on 06/12/2022 96:28:36 [name] ([id number]) -------------------------------------------------------------------------------- SuperBill Details Patient Name: [name] Date of Service: 06/12/2022 Medical Record Number: [id number] Patient Account Number: [id number] Date of Birth/Sex: 1985-11-07 (35 y.o. F) Treating RN: Yevonne Pax Primary Care Provider: Lynnell Chad Other Clinician: Referring Provider: Lynnell Chad Treating Provider/Extender: Rowan Blase in Treatment: 19 Diagnosis Coding ICD-10 Codes Code Description E11.622 Type 2 diabetes mellitus with other skin ulcer T81.31XA Disruption of external operation (surgical) wound, not elsewhere classified, initial encounter L97.812 Non-pressure chronic ulcer of other part of right lower leg with fat layer exposed E11.43 Type 2 diabetes mellitus with diabetic autonomic (poly)neuropathy I10 Essential (primary) hypertension Facility Procedures CPT4 Code: [id number] Description: [phone] -  DEB SUBQ  TISSUE 20 SQ CM/< Modifier: Quantity: 1 CPT4 Code: Description: ICD-10 Diagnosis Description [id number] Non-pressure chronic ulcer of other part of right lower leg with fat laye Modifier: r exposed Quantity: Physician Procedures CPT4 Code: [id number] Description: [id number] - WC PHYS SUBQ TISS 20 SQ CM Modifier: Quantity: 1 CPT4 Code: Description: ICD-10 Diagnosis Description [id number] Non-pressure chronic ulcer of other part of right lower leg with fat laye Modifier: r exposed Quantity: Electronic Signature(s) Signed: 06/12/2022 8:28:49 AM By: Worthy Keeler PA-C Entered By: Worthy Keeler on 06/12/2022 08:28:49

## 2022-06-12 NOTE — Progress Notes (Addendum)
ORINE, GOGA ([id number]) Visit Report for 06/12/2022 Arrival Information Details Patient Name: [name], [name] Date of Service: 06/12/2022 8:00 AM Medical Record Number: [id number] Patient Account Number: [id number] Date of Birth/Sex: 03-18-1986 (36 y.o. F) Treating RN: Carlene Coria Primary Care Cesario Weidinger: Jac Canavan Other Clinician: Referring Zyeir Dymek: Jac Canavan Treating Charlis Harner/Extender: Skipper Cliche in Treatment: 72 Visit Information History Since Last Visit All ordered tests and consults were completed: No Patient Arrived: Wheel Chair Added or deleted any medications: No Arrival Time: 08:06 Any new allergies or adverse reactions: No Accompanied By: self Had a fall or experienced change in No Transfer Assistance: None activities of daily living that may affect Patient Identification Verified: Yes risk of falls: Secondary Verification Process Completed: Yes Signs or symptoms of abuse/neglect since last visito No Patient Requires Transmission-Based Precautions: No Hospitalized since last visit: No Patient Has Alerts: Yes Implantable device outside of the clinic excluding No cellular tissue based products placed in the center since last visit: Has Dressing in Place as Prescribed: Yes Has Compression in Place as Prescribed: Yes Pain Present Now: No Electronic Signature(s) Signed: 06/12/2022 8:13:04 AM By: Carlene Coria RN Entered By: Carlene Coria on 06/12/2022 08:06:22 [name] ([id number]) -------------------------------------------------------------------------------- Clinic Level of Care Assessment Details Patient Name: [name] Date of Service: 06/12/2022 8:00 AM Medical Record Number: [id number] Patient Account Number: [id number] Date of Birth/Sex: September 22, 1986 (35 y.o. F) Treating RN: Carlene Coria Primary Care Edis Huish: Jac Canavan Other Clinician: Referring Malin Cervini: Jac Canavan Treating Keyanah Kozicki/Extender: Skipper Cliche in Treatment:  19 Clinic Level of Care Assessment Items TOOL 1 Quantity Score [] - Use when EandM and Procedure is performed on INITIAL visit 0 ASSESSMENTS - Nursing Assessment / Reassessment [] - General Physical Exam (combine w/ comprehensive assessment (listed just below) when performed on new 0 pt. evals) [] - 0 Comprehensive Assessment (HX, ROS, Risk Assessments, Wounds Hx, etc.) ASSESSMENTS - Wound and Skin Assessment / Reassessment [] - Dermatologic / Skin Assessment (not related to wound area) 0 ASSESSMENTS - Ostomy and/or Continence Assessment and Care [] - Incontinence Assessment and Management 0 [] - 0 Ostomy Care Assessment and Management (repouching, etc.) PROCESS - Coordination of Care [] - Simple Patient / Family Education for ongoing care 0 [] - 0 Complex (extensive) Patient / Family Education for ongoing care [] - 0 Staff obtains Programmer, systems, Records, Test Results / Process Orders [] - 0 Staff telephones HHA, Nursing Homes / Clarify orders / etc [] - 0 Routine Transfer to another Facility (non-emergent condition) [] - 0 Routine Hospital Admission (non-emergent condition) [] - 0 New Admissions / Biomedical engineer / Ordering NPWT, Apligraf, etc. [] - 0 Emergency Hospital Admission (emergent condition) PROCESS - Special Needs [] - Pediatric / Minor Patient Management 0 [] - 0 Isolation Patient Management [] - 0 Hearing / Language / Visual special needs [] - 0 Assessment of Community assistance (transportation, D/C planning, etc.) [] - 0 Additional assistance / Altered mentation [] - 0 Support Surface(s) Assessment (bed, cushion, seat, etc.) INTERVENTIONS - Miscellaneous [] - External ear exam 0 [] - 0 Patient Transfer (multiple staff / Civil Service fast streamer / Similar devices) [] - 0 Simple Staple / Suture removal (25 or less) [] - 0 Complex Staple / Suture removal (26 or more) [] - 0 Hypo/Hyperglycemic Management (do not check if billed separately) [] - 0 Ankle /  Brachial Index (ABI) - do not check if billed separately Has the patient been seen at the hospital within the last three years: Yes  Total Score: 0 Level Of Care: ____ [name] ([id number]) Electronic Signature(s) Signed: 06/12/2022 4:14:55 PM By: Carlene Coria RN Entered By: Carlene Coria on 06/12/2022 08:22:47 [name] ([id number]) -------------------------------------------------------------------------------- Encounter Discharge Information Details Patient Name: [name] Date of Service: 06/12/2022 8:00 AM Medical Record Number: [id number] Patient Account Number: [id number] Date of Birth/Sex: 07-21-1986 (35 y.o. F) Treating RN: Carlene Coria Primary Care Kateri Balch: Jac Canavan Other Clinician: Referring Dian Minahan: Jac Canavan Treating Ilias Stcharles/Extender: Skipper Cliche in Treatment: 19 Encounter Discharge Information Items Post Procedure Vitals Discharge Condition: Stable Temperature (F): 98.3 Ambulatory Status: Wheelchair Pulse (bpm): 114 Discharge Destination: Home Respiratory Rate (breaths/min): 18 Transportation: Private Auto Blood Pressure (mmHg): 138/91 Accompanied By: self Schedule Follow-up Appointment: Yes Clinical Summary of Care: Electronic Signature(s) Signed: 06/12/2022 4:14:55 PM By: Carlene Coria RN Entered By: Carlene Coria on 06/12/2022 08:23:56 [name] ([id number]) -------------------------------------------------------------------------------- Lower Extremity Assessment Details Patient Name: [name] Date of Service: 06/12/2022 8:00 AM Medical Record Number: [id number] Patient Account Number: [id number] Date of Birth/Sex: 1986-08-03 (35 y.o. F) Treating RN: Carlene Coria Primary Care Racquel Arkin: Jac Canavan Other Clinician: Referring Shantana Christon: Jac Canavan Treating Yissel Habermehl/Extender: Skipper Cliche in Treatment: 19 Electronic Signature(s) Signed: 06/12/2022 8:13:04 AM By: Carlene Coria RN Entered By: Carlene Coria on  06/12/2022 08:10:21 [name] ([id number]) -------------------------------------------------------------------------------- Multi Wound Chart Details Patient Name: [name] Date of Service: 06/12/2022 8:00 AM Medical Record Number: [id number] Patient Account Number: [id number] Date of Birth/Sex: [birth year] (35 y.o. F) Treating RN: Carlene Coria Primary Care Jairen Goldfarb: Jac Canavan Other Clinician: Referring Ailis Rigaud: Jac Canavan Treating Leiloni Smithers/Extender: Skipper Cliche in Treatment: 19 Vital Signs Height(in): 67 Pulse(bpm): 114 Weight(lbs): 219 Blood Pressure(mmHg): 138/91 Body Mass Index(BMI): 34.3 Temperature(F): 98.3 Respiratory Rate(breaths/min): 18 Photos: [N/A:N/A] Wound Location: Right Amputation Site - Below Knee N/A N/A Wounding Event: Gradually Appeared N/A N/A Primary Etiology: Abscess N/A N/A Comorbid History: Hypertension, Type II Diabetes N/A N/A Date Acquired: 01/14/2022 N/A N/A Weeks of Treatment: 19 N/A N/A Wound Status: Open N/A N/A Wound Recurrence: No N/A N/A Measurements L x W x D (cm) 1x2x0.1 N/A N/A Area (cm) : 1.571 N/A N/A Volume (cm) : 0.157 N/A N/A % Reduction in Area: 94.20% N/A N/A % Reduction in Volume: 99.90% N/A N/A Classification: Full Thickness With Exposed N/A N/A Support Structures Exudate Amount: Medium N/A N/A Exudate Type: Serosanguineous N/A N/A Exudate Color: red, brown N/A N/A Wound Margin: Flat and Intact N/A N/A Granulation Amount: Large (67-100%) N/A N/A Granulation Quality: Red N/A N/A Necrotic Amount: None Present (0%) N/A N/A Exposed Structures: Fat Layer (Subcutaneous Tissue): N/A N/A Yes Fascia: No Tendon: No Muscle: No Joint: No Bone: No Epithelialization: Small (1-33%) N/A N/A Treatment Notes Electronic Signature(s) Signed: 06/12/2022 8:13:04 AM By: Carlene Coria RN Entered By: Carlene Coria on 06/12/2022 08:10:36 [name]  ([id number]) -------------------------------------------------------------------------------- Lawn Details Patient Name: [name] Date of Service: 06/12/2022 8:00 AM Medical Record Number: [id number] Patient Account Number: [id number] Date of Birth/Sex: 04/28/1986 (35 y.o. F) Treating RN: Carlene Coria Primary Care Elanda Garmany: Jac Canavan Other Clinician: Referring Akshath Mccarey: Jac Canavan Treating Tranika Scholler/Extender: Skipper Cliche in Treatment: 19 Active Inactive Wound/Skin Impairment Nursing Diagnoses: Knowledge deficit related to ulceration/compromised skin integrity Goals: Patient/caregiver will verbalize understanding of skin care regimen Date Initiated: 01/27/2022 Target Resolution Date: 05/25/2022 Goal Status: Active Ulcer/skin breakdown will have a volume reduction of 30% by week 4 Date Initiated: 01/27/2022 Date Inactivated: 04/24/2022 Target Resolution Date: 03/29/2022 Goal Status: Met Ulcer/skin breakdown will have a volume reduction of 50% by week  8 Date Initiated: 01/27/2022 Date Inactivated: 04/24/2022 Target Resolution Date: 04/28/2022 Goal Status: Met Ulcer/skin breakdown will have a volume reduction of 80% by week 12 Date Initiated: 01/27/2022 Target Resolution Date: 05/29/2022 Goal Status: Active Ulcer/skin breakdown will heal within 14 weeks Date Initiated: 01/27/2022 Target Resolution Date: 06/29/2022 Goal Status: Active Interventions: Assess patient/caregiver ability to obtain necessary supplies Assess patient/caregiver ability to perform ulcer/skin care regimen upon admission and as needed Assess ulceration(s) every visit Notes: Electronic Signature(s) Signed: 06/12/2022 8:13:04 AM By: Carlene Coria RN Entered By: Carlene Coria on 06/12/2022 08:10:25 [name] ([id number]) -------------------------------------------------------------------------------- Pain Assessment Details Patient Name: [name] Date of Service:  06/12/2022 8:00 AM Medical Record Number: [id number] Patient Account Number: [id number] Date of Birth/Sex: 06-10-1986 (36 y.o. F) Treating RN: Carlene Coria Primary Care Provider: Jac Canavan Other Clinician: Referring Provider: Jac Canavan Treating Provider/Extender: Skipper Cliche in Treatment: 19 Active Problems Location of Pain Severity and Description of Pain Patient Has Paino No Site Locations Pain Management and Medication Current Pain Management: Electronic Signature(s) Signed: 06/12/2022 8:13:04 AM By: Carlene Coria RN Entered By: Carlene Coria on 06/12/2022 08:06:49 [name] ([id number]) -------------------------------------------------------------------------------- Patient/Caregiver Education Details Patient Name: [name] Date of Service: 06/12/2022 8:00 AM Medical Record Number: [id number] Patient Account Number: [id number] Date of Birth/Gender: 1986/07/20 (35 y.o. F) Treating RN: Carlene Coria Primary Care Physician: Jac Canavan Other Clinician: Referring Physician: Jac Canavan Treating Physician/Extender: Skipper Cliche in Treatment: 72 Education Assessment Education Provided To: Patient Education Topics Provided Wound/Skin Impairment: Methods: Explain/Verbal Responses: State content correctly Electronic Signature(s) Signed: 06/12/2022 4:14:55 PM By: Carlene Coria RN Entered By: Carlene Coria on 06/12/2022 08:23:03 [name] ([id number]) -------------------------------------------------------------------------------- Wound Assessment Details Patient Name: [name] Date of Service: 06/12/2022 8:00 AM Medical Record Number: [id number] Patient Account Number: [id number] Date of Birth/Sex: 28-Sep-1986 (35 y.o. F) Treating RN: Carlene Coria Primary Care Provider: Jac Canavan Other Clinician: Referring Provider: Jac Canavan Treating Provider/Extender: Skipper Cliche in Treatment: 19 Wound Status Wound Number: 1 Primary  Etiology: Abscess Wound Location: Right Amputation Site - Below Knee Wound Status: Open Wounding Event: Gradually Appeared Comorbid History: Hypertension, Type II Diabetes Date Acquired: 01/14/2022 Weeks Of Treatment: 19 Clustered Wound: No Photos Wound Measurements Length: (cm) 1 Width: (cm) 2 Depth: (cm) 0.1 Area: (cm) 1.571 Volume: (cm) 0.157 % Reduction in Area: 94.2% % Reduction in Volume: 99.9% Epithelialization: Small (1-33%) Tunneling: No Undermining: No Wound Description Classification: Full Thickness With Exposed Support Structures Wound Margin: Flat and Intact Exudate Amount: Medium Exudate Type: Serosanguineous Exudate Color: red, brown Foul Odor After Cleansing: No Slough/Fibrino No Wound Bed Granulation Amount: Large (67-100%) Exposed Structure Granulation Quality: Red Fascia Exposed: No Necrotic Amount: None Present (0%) Fat Layer (Subcutaneous Tissue) Exposed: Yes Tendon Exposed: No Muscle Exposed: No Joint Exposed: No Bone Exposed: No Treatment Notes Wound #1 (Amputation Site - Below Knee) Wound Laterality: Right Cleanser Peri-Wound Care Topical [name], [name] ([id number]) Primary Dressing Prisma 4.34 (in) Discharge Instruction: Moisten w/normal saline or sterile water; Cover wound as directed. Do not remove from wound bed. Secondary Dressing ABD Pad 5x9 (in/in) Discharge Instruction: Cover with ABD pad Secured With [place] H Soft Cloth Surgical Tape, 2x2 (in/yd) Compression Wrap Compression Stockings Add-Ons Electronic Signature(s) Signed: 06/12/2022 8:13:04 AM By: Carlene Coria RN Entered By: Carlene Coria on 06/12/2022 08:10:09 [name] ([id number]) -------------------------------------------------------------------------------- Vitals Details Patient Name: [name] Date of Service: 06/12/2022 8:00 AM Medical Record Number: [id number] Patient Account Number: [id number] Date of Birth/Sex: [birth year] (36 y.o.  F) Treating RN: Carlene Coria Primary Care Provider: Jac Canavan Other Clinician: Referring Provider: Jac Canavan Treating Provider/Extender: Skipper Cliche in Treatment: 19 Vital Signs Time Taken: 08:06 Temperature (F): 98.3 Height (in): 67 Pulse (bpm): 114 Weight (lbs): 219 Respiratory Rate (breaths/min): 18 Body Mass Index (BMI): 34.3 Blood Pressure (mmHg): 138/91 Reference Range: 80 - 120 mg / dl Electronic Signature(s) Signed: 06/12/2022 8:13:04 AM By: Carlene Coria RN Entered By: Carlene Coria on 06/12/2022 08:06:43

## 2022-06-13 ENCOUNTER — Ambulatory Visit: Payer: Medicaid Other | Admitting: Physician Assistant

## 2022-06-19 ENCOUNTER — Encounter: Payer: Medicaid Other | Admitting: Physician Assistant

## 2022-06-19 DIAGNOSIS — E11622 Type 2 diabetes mellitus with other skin ulcer: Secondary | ICD-10-CM | POA: Diagnosis not present

## 2022-06-19 NOTE — Progress Notes (Addendum)
JAMAL, HASKIN ([id number]) Visit Report for 06/19/2022 Chief Complaint Document Details Patient Name: [name], [name] Date of Service: 06/19/2022 8:00 AM Medical Record Number: [id number] Patient Account Number: [id number] Date of Birth/Sex: Dec 31, 1985 (36 y.o. F) Treating RN: Yevonne Pax Primary Care Provider: Lynnell Chad Other Clinician: Referring Provider: Lynnell Chad Treating Provider/Extender: Rowan Blase in Treatment: 20 Information Obtained from: Patient Chief Complaint Right BKA Amputation Site Disruption Electronic Signature(s) Signed: 06/19/2022 8:17:32 AM By: Lenda Kelp PA-C Entered By: Lenda Kelp on 06/19/2022 08:17:32 [name] ([id number]) -------------------------------------------------------------------------------- Debridement Details Patient Name: [name] Date of Service: 06/19/2022 8:00 AM Medical Record Number: [id number] Patient Account Number: [id number] Date of Birth/Sex: 1985-10-17 (35 y.o. F) Treating RN: Yevonne Pax Primary Care Provider: Lynnell Chad Other Clinician: Referring Provider: Lynnell Chad Treating Provider/Extender: Rowan Blase in Treatment: 20 Debridement Performed for Wound #1 Right Amputation Site - Below Knee Assessment: Performed By: Physician Nelida Meuse., PA-C Debridement Type: Debridement Level of Consciousness (Pre- Awake and Alert procedure): Pre-procedure Verification/Time Out Yes - 08:20 Taken: Start Time: 08:20 Pain Control: Lidocaine 4% Topical Solution Total Area Debrided (L x W): 1 (cm) x 2 (cm) = 2 (cm) Tissue and other material Viable, Non-Viable, Slough, Subcutaneous, Skin: Dermis , Skin: Epidermis, Slough debrided: Level: Skin/Subcutaneous Tissue Debridement Description: Excisional Instrument: Curette Bleeding: Minimum Hemostasis Achieved: Pressure End Time: 08:24 Procedural Pain: 0 Post Procedural Pain: 0 Response to Treatment: Procedure was tolerated well Level  of Consciousness (Post- Awake and Alert procedure): Post Debridement Measurements of Total Wound Length: (cm) 0.7 Width: (cm) 1.8 Depth: (cm) 0.1 Volume: (cm) 0.099 Character of Wound/Ulcer Post Debridement: Improved Post Procedure Diagnosis Same as Pre-procedure Electronic Signature(s) Signed: 06/19/2022 2:31:54 PM By: Yevonne Pax RN Signed: 06/19/2022 4:49:05 PM By: Lenda Kelp PA-C Entered By: Yevonne Pax on 06/19/2022 08:25:05 [name] ([id number]) -------------------------------------------------------------------------------- HPI Details Patient Name: [name] Date of Service: 06/19/2022 8:00 AM Medical Record Number: [id number] Patient Account Number: [id number] Date of Birth/Sex: 05-Nov-1985 (35 y.o. F) Treating RN: Yevonne Pax Primary Care Provider: Lynnell Chad Other Clinician: Referring Provider: Lynnell Chad Treating Provider/Extender: Rowan Blase in Treatment: 20 History of Present Illness HPI Description: 01-27-2022 upon evaluation today patient appears to be doing somewhat poorly in regard to her right below-knee amputation. This unfortunately developed a abscess internally following the initial amputation which was January 2023. Subsequently she was getting very close to starting to work on the prosthesis when she had an area that somewhat bulged out ended up being an abscess and the incision and drainage was performed April 22. Subsequently she did have antibiotic beads placed in a wound VAC after this was cleared out. Her most recent reported hemoglobin A1c was 6.4. With that being said the patient tells me that her blood sugars seem to be under pretty good control as best she can tell at this point. She also tells me that the wound VAC seems to be functioning appropriately although upon evaluation the did not appear to be anything tucked into the region of undermining and depth in the roughly 9-12 o'clock location based on what we saw today.  This is also where the antibiotic beads are and I can see those in place. I do believe that she is getting need to have the wound VAC tucked down into this location. Patient does have a history of diabetes mellitus type 2, diabetic neuropathy, and hypertension. This initial amputation occurred as a result of her having sewing needles that actually went into her  heel she did not know about it started having trouble with her heel and ended up with a significant infection which was stated to be necrotizing and she subsequently had to have emergency amputation. 02-13-2022 upon evaluation today patient appears to be doing well with regard to her wound. She has been tolerating the dressing changes without complication. The wound VAC does seem to be doing a great job. Fortunately I do not see any evidence of active infection locally or systemically at this time which is great news. She still has some of the antibiotic beads in the base of the wound. 02-20-2022 upon evaluation today patient appears to be doing well currently. There does not appear to be any evidence of active infection locally nor systemically which is great news and overall I am extremely pleased with where we stand today. I do believe that the wound VAC is doing a good job here and the color of the drainage is improved again were unsure as to whether this may have been the antibiotic beads or if there was something such as Pseudomonas causing the greenish discoloration to the drainage either way she is doing significantly better. 03-06-2022 upon evaluation today patient appears to be doing well with regard to her wound. The only issue I see is that she does have a little bit of a pocket of fluid that collected in the deeper section everything else is actually measuring smaller and looks better. This was a point where I really did expect this was been a turnaround and start doing a lot better. I think we are especially seeing that right  now. 03-20-2022 upon evaluation today patient appears to be doing well with regard to her wound in fact this is actually significantly improved compared to last time I saw her. Fortunately I do not see any evidence of active infection at this time. No fevers, chills, nausea, vomiting, or diarrhea. 03-27-2022 upon evaluation today patient appears to be doing excellent in regard to her wound. This is showing signs of improvement which is great news and overall I am extremely pleased with where we stand currently. I do not see any evidence of active infection locally or systemically which is great news. No fevers, chills, nausea, vomiting, or diarrhea. 04-03-2022 upon evaluation today patient's wound is showing signs of significant improvement. I am still very pleased with where we stand and how things are going. I think that she is really making excellent progress which is great news. I do not see any signs of active infection at this time. 04-10-2022 upon evaluation today patient's wound actually showed signs of good granulation and epithelization at this point. Fortunately I do not see any evidence of active infection at this time which is great news and overall I am extremely pleased with where things stand currently. No fevers, chills, nausea, vomiting, or diarrhea. 04-17-2022 upon evaluation today patient appears to be doing well with regard to her wound she is actually showing signs of significant improvement which is great news. I do believe that she is making good progress with the current wound care measures. Fortunately I do not see any evidence of active infection locally or systemically which is great news. 7/24; right BKA site secondary to an abscess. Per our intake nurse wound VAC came off 2 weeks ago she has been using silver collagen. Nice improvements in wound dimensions. She does not have a prosthesis as of yet 05-01-2022 upon evaluation today patient appears to be doing well with regard  to  her wound in fact this is measuring significantly smaller and is really doing quite well. Fortunately I do not see any evidence of active infection locally or systemically at this time. 05-08-2022 upon evaluation today patient's wound is actually showing signs of excellent improvement. I am actually very pleased with where we stand I think that she is making great progress and I do not see any signs of active infection at this time which is great news. No fevers, chills, nausea, vomiting, or diarrhea. 05-15-2022 upon evaluation today patient appears to be doing well currently in regard to her wound. This is actually showing signs of excellent improvement is noted completely to the surface there is just a little bit of biofilm although we are can have to clear this away with sharp debridement as she is saline and gauze treatment is not enough to get this loosened today. 05-22-2022 upon evaluation today patient appears to be doing excellent in regard to her wound this is showing signs of excellent improvement the surgeon she saw just last week he was extremely pleased with how things were doing and in fact was shocked with the progress. 05-29-2022 upon evaluation today patient appears to be doing well currently in regard to her wound the wound does appear to have little bit of Knee, [place] ([id number]) slough and biofilm on the surface of the wound that would not clearway today but the good news is she seems to be doing excellent otherwise. There does not appear to be any signs of active infection locally or systemically at this time. 06-06-2022 upon evaluation today patient appears to be doing well with regard to her wound the amputation site right leg. She has been tolerating the dressing changes without complication. Fortunately there does not appear to be any evidence of active infection locally or systemically at this time. 06-12-2022 upon evaluation today patient appears to be doing well currently  in regard to her wound. She is actually showing signs of improvement there is some slough and biofilm noted on the surface of the wound. Fortunately there does not appear to be any signs of active infection locally or systemically at this time which is great news. No fevers, chills, nausea, vomiting, or diarrhea. 06-19-2022 upon evaluation today patient appears to be doing well currently in regard to her wound this is actually measuring significantly smaller again and looks to be doing excellent. I see no signs of active infection at this time which is great news. Electronic Signature(s) Signed: 06/19/2022 8:26:39 AM By: Lenda KelpStone III, Shalee Paolo PA-C Entered By: Lenda KelpStone III, Toniqua Melamed on 06/19/2022 08:26:39 [name], [name] ([id number]) -------------------------------------------------------------------------------- Physical Exam Details Patient Name: [name], [name] Date of Service: 06/19/2022 8:00 AM Medical Record Number: [id number] Patient Account Number: [id number] Date of Birth/Sex: September 03, 1986 (35 y.o. F) Treating RN: Yevonne PaxEpps, Carrie Primary Care Provider: Lynnell ChadIjaola, Onyeje Other Clinician: Referring Provider: Lynnell ChadIjaola, Onyeje Treating Provider/Extender: Rowan BlaseStone, Tyheim Vanalstyne Weeks in Treatment: 20 Constitutional Well-nourished and well-hydrated in no acute distress. Respiratory normal breathing without difficulty. Psychiatric this patient is able to make decisions and demonstrates good insight into disease process. Alert and Oriented x 3. pleasant and cooperative. Notes Upon inspection patient's wound bed actually showed signs of good granulation and epithelization at this point. Fortunately I see no evidence of infection locally or systemically which is great news and overall I am extremely pleased with where we stand today. Electronic Signature(s) Signed: 06/19/2022 8:26:53 AM By: Lenda KelpStone III, Meredith Kilbride PA-C Entered By: Lenda KelpStone III, Quincey Quesinberry on 06/19/2022 08:26:53 [name], [name]  ([id number]) -------------------------------------------------------------------------------- Physician  Orders Details Patient Name: [name], [name] Date of Service: 06/19/2022 8:00 AM Medical Record Number: [id number] Patient Account Number: [id number] Date of Birth/Sex: 1985-12-14 (36 y.o. F) Treating RN: Yevonne Pax Primary Care Provider: Lynnell Chad Other Clinician: Referring Provider: Lynnell Chad Treating Provider/Extender: Rowan Blase in Treatment: 20 Verbal / Phone Orders: No Diagnosis Coding ICD-10 Coding Code Description E11.622 Type 2 diabetes mellitus with other skin ulcer T81.31XA Disruption of external operation (surgical) wound, not elsewhere classified, initial encounter L97.812 Non-pressure chronic ulcer of other part of right lower leg with fat layer exposed E11.43 Type 2 diabetes mellitus with diabetic autonomic (poly)neuropathy I10 Essential (primary) hypertension Follow-up Appointments o Return Appointment in 1 week. Bathing/ Shower/ Hygiene o May shower; gently cleanse wound with antibacterial soap, rinse and pat dry prior to dressing wounds Anesthetic (Use 'Patient Medications' Section for Anesthetic Order Entry) o Lidocaine applied to wound bed Edema Control - Lymphedema / Segmental Compressive Device / Other o Other: - stump shrinker Wound Treatment Wound #1 - Amputation Site - Below Knee Wound Laterality: Right Peri-Wound Care: AandD Ointment 3 x Per Week/30 Days Discharge Instructions: wound edges Primary Dressing: Prisma 4.34 (in) (Generic) 3 x Per Week/30 Days Discharge Instructions: Moisten w/normal saline or sterile water; Cover wound as directed. Do not remove from wound bed. Secondary Dressing: Gauze 3 x Per Week/30 Days Secured With: Medipore Tape - 62M Medipore H Soft Cloth Surgical Tape, 2x2 (in/yd) 3 x Per Week/30 Days Electronic Signature(s) Signed: 06/19/2022 2:31:54 PM By: Yevonne Pax RN Signed: 06/19/2022 4:49:05 PM By:  Lenda Kelp PA-C Entered By: Yevonne Pax on 06/19/2022 08:25:37 [name] ([id number]) -------------------------------------------------------------------------------- Problem List Details Patient Name: [name] Date of Service: 06/19/2022 8:00 AM Medical Record Number: [id number] Patient Account Number: [id number] Date of Birth/Sex: Jul 05, 1986 (35 y.o. F) Treating RN: Yevonne Pax Primary Care Provider: Lynnell Chad Other Clinician: Referring Provider: Lynnell Chad Treating Provider/Extender: Rowan Blase in Treatment: 20 Active Problems ICD-10 Encounter Code Description Active Date MDM Diagnosis E11.622 Type 2 diabetes mellitus with other skin ulcer 01/27/2022 No Yes T81.31XA Disruption of external operation (surgical) wound, not elsewhere 01/27/2022 No Yes classified, initial encounter L97.812 Non-pressure chronic ulcer of other part of right lower leg with fat layer 01/27/2022 No Yes exposed E11.43 Type 2 diabetes mellitus with diabetic autonomic (poly)neuropathy 01/27/2022 No Yes I10 Essential (primary) hypertension 01/27/2022 No Yes Inactive Problems Resolved Problems Electronic Signature(s) Signed: 06/19/2022 8:17:27 AM By: Lenda Kelp PA-C Entered By: Lenda Kelp on 06/19/2022 08:17:27 [name] ([id number]) -------------------------------------------------------------------------------- Progress Note Details Patient Name: [name] Date of Service: 06/19/2022 8:00 AM Medical Record Number: [id number] Patient Account Number: [id number] Date of Birth/Sex: May 20, 1986 (35 y.o. F) Treating RN: Yevonne Pax Primary Care Provider: Lynnell Chad Other Clinician: Referring Provider: Lynnell Chad Treating Provider/Extender: Rowan Blase in Treatment: 20 Subjective Chief Complaint Information obtained from Patient Right BKA Amputation Site Disruption History of Present Illness (HPI) 01-27-2022 upon evaluation today patient appears to  be doing somewhat poorly in regard to her right below-knee amputation. This unfortunately developed a abscess internally following the initial amputation which was January 2023. Subsequently she was getting very close to starting to work on the prosthesis when she had an area that somewhat bulged out ended up being an abscess and the incision and drainage was performed April 22. Subsequently she did have antibiotic beads placed in a wound VAC after this was cleared out. Her most recent reported hemoglobin A1c was 6.4. With that being said the patient  tells me that her blood sugars seem to be under pretty good control as best she can tell at this point. She also tells me that the wound VAC seems to be functioning appropriately although upon evaluation the did not appear to be anything tucked into the region of undermining and depth in the roughly 9-12 o'clock location based on what we saw today. This is also where the antibiotic beads are and I can see those in place. I do believe that she is getting need to have the wound VAC tucked down into this location. Patient does have a history of diabetes mellitus type 2, diabetic neuropathy, and hypertension. This initial amputation occurred as a result of her having sewing needles that actually went into her heel she did not know about it started having trouble with her heel and ended up with a significant infection which was stated to be necrotizing and she subsequently had to have emergency amputation. 02-13-2022 upon evaluation today patient appears to be doing well with regard to her wound. She has been tolerating the dressing changes without complication. The wound VAC does seem to be doing a great job. Fortunately I do not see any evidence of active infection locally or systemically at this time which is great news. She still has some of the antibiotic beads in the base of the wound. 02-20-2022 upon evaluation today patient appears to be doing well  currently. There does not appear to be any evidence of active infection locally nor systemically which is great news and overall I am extremely pleased with where we stand today. I do believe that the wound VAC is doing a good job here and the color of the drainage is improved again were unsure as to whether this may have been the antibiotic beads or if there was something such as Pseudomonas causing the greenish discoloration to the drainage either way she is doing significantly better. 03-06-2022 upon evaluation today patient appears to be doing well with regard to her wound. The only issue I see is that she does have a little bit of a pocket of fluid that collected in the deeper section everything else is actually measuring smaller and looks better. This was a point where I really did expect this was been a turnaround and start doing a lot better. I think we are especially seeing that right now. 03-20-2022 upon evaluation today patient appears to be doing well with regard to her wound in fact this is actually significantly improved compared to last time I saw her. Fortunately I do not see any evidence of active infection at this time. No fevers, chills, nausea, vomiting, or diarrhea. 03-27-2022 upon evaluation today patient appears to be doing excellent in regard to her wound. This is showing signs of improvement which is great news and overall I am extremely pleased with where we stand currently. I do not see any evidence of active infection locally or systemically which is great news. No fevers, chills, nausea, vomiting, or diarrhea. 04-03-2022 upon evaluation today patient's wound is showing signs of significant improvement. I am still very pleased with where we stand and how things are going. I think that she is really making excellent progress which is great news. I do not see any signs of active infection at this time. 04-10-2022 upon evaluation today patient's wound actually showed signs of good  granulation and epithelization at this point. Fortunately I do not see any evidence of active infection at this time which is great news and  overall I am extremely pleased with where things stand currently. No fevers, chills, nausea, vomiting, or diarrhea. 04-17-2022 upon evaluation today patient appears to be doing well with regard to her wound she is actually showing signs of significant improvement which is great news. I do believe that she is making good progress with the current wound care measures. Fortunately I do not see any evidence of active infection locally or systemically which is great news. 7/24; right BKA site secondary to an abscess. Per our intake nurse wound VAC came off 2 weeks ago she has been using silver collagen. Nice improvements in wound dimensions. She does not have a prosthesis as of yet 05-01-2022 upon evaluation today patient appears to be doing well with regard to her wound in fact this is measuring significantly smaller and is really doing quite well. Fortunately I do not see any evidence of active infection locally or systemically at this time. 05-08-2022 upon evaluation today patient's wound is actually showing signs of excellent improvement. I am actually very pleased with where we stand I think that she is making great progress and I do not see any signs of active infection at this time which is great news. No fevers, chills, nausea, vomiting, or diarrhea. 05-15-2022 upon evaluation today patient appears to be doing well currently in regard to her wound. This is actually showing signs of excellent improvement is noted completely to the surface there is just a little bit of biofilm although we are can have to clear this away with sharp debridement as she is saline and gauze treatment is not enough to get this loosened today. [name], [name] ([id number]) 05-22-2022 upon evaluation today patient appears to be doing excellent in regard to her wound this is showing signs of  excellent improvement the surgeon she saw just last week he was extremely pleased with how things were doing and in fact was shocked with the progress. 05-29-2022 upon evaluation today patient appears to be doing well currently in regard to her wound the wound does appear to have little bit of slough and biofilm on the surface of the wound that would not clearway today but the good news is she seems to be doing excellent otherwise. There does not appear to be any signs of active infection locally or systemically at this time. 06-06-2022 upon evaluation today patient appears to be doing well with regard to her wound the amputation site right leg. She has been tolerating the dressing changes without complication. Fortunately there does not appear to be any evidence of active infection locally or systemically at this time. 06-12-2022 upon evaluation today patient appears to be doing well currently in regard to her wound. She is actually showing signs of improvement there is some slough and biofilm noted on the surface of the wound. Fortunately there does not appear to be any signs of active infection locally or systemically at this time which is great news. No fevers, chills, nausea, vomiting, or diarrhea. 06-19-2022 upon evaluation today patient appears to be doing well currently in regard to her wound this is actually measuring significantly smaller again and looks to be doing excellent. I see no signs of active infection at this time which is great news. Objective Constitutional Well-nourished and well-hydrated in no acute distress. Vitals Time Taken: 8:00 AM, Height: 67 in, Weight: 219 lbs, BMI: 34.3, Temperature: 98.3 F, Pulse: 112 bpm, Respiratory Rate: 18 breaths/min, Blood Pressure: 124/83 mmHg. Respiratory normal breathing without difficulty. Psychiatric this patient is able to make decisions  and demonstrates good insight into disease process. Alert and Oriented x 3. pleasant and  cooperative. General Notes: Upon inspection patient's wound bed actually showed signs of good granulation and epithelization at this point. Fortunately I see no evidence of infection locally or systemically which is great news and overall I am extremely pleased with where we stand today. Integumentary (Hair, Skin) Wound #1 status is Open. Original cause of wound was Gradually Appeared. The date acquired was: 01/14/2022. The wound has been in treatment 20 weeks. The wound is located on the Right Amputation Site - Below Knee. The wound measures 0.7cm length x 1.8cm width x 0.1cm depth; 0.99cm^2 area and 0.099cm^3 volume. There is Fat Layer (Subcutaneous Tissue) exposed. There is no tunneling or undermining noted. There is a medium amount of serosanguineous drainage noted. The wound margin is flat and intact. There is large (67-100%) red granulation within the wound bed. There is no necrotic tissue within the wound bed. Assessment Active Problems ICD-10 Type 2 diabetes mellitus with other skin ulcer Disruption of external operation (surgical) wound, not elsewhere classified, initial encounter Non-pressure chronic ulcer of other part of right lower leg with fat layer exposed Type 2 diabetes mellitus with diabetic autonomic (poly)neuropathy Essential (primary) hypertension Procedures Wound #1 Pre-procedure diagnosis of Wound #1 is an Abscess located on the Right Amputation Site - Below Knee . There was a Excisional [name], [name] ([id number]) Skin/Subcutaneous Tissue Debridement with a total area of 2 sq cm performed by Nelida Meuse., PA-C. With the following instrument(s): Curette to remove Viable and Non-Viable tissue/material. Material removed includes Subcutaneous Tissue, Slough, Skin: Dermis, and Skin: Epidermis after achieving pain control using Lidocaine 4% Topical Solution. No specimens were taken. A time out was conducted at 08:20, prior to the start of the procedure. A Minimum amount of  bleeding was controlled with Pressure. The procedure was tolerated well with a pain level of 0 throughout and a pain level of 0 following the procedure. Post Debridement Measurements: 0.7cm length x 1.8cm width x 0.1cm depth; 0.099cm^3 volume. Character of Wound/Ulcer Post Debridement is improved. Post procedure Diagnosis Wound #1: Same as Pre-Procedure Plan Follow-up Appointments: Return Appointment in 1 week. Bathing/ Shower/ Hygiene: May shower; gently cleanse wound with antibacterial soap, rinse and pat dry prior to dressing wounds Anesthetic (Use 'Patient Medications' Section for Anesthetic Order Entry): Lidocaine applied to wound bed Edema Control - Lymphedema / Segmental Compressive Device / Other: Other: - stump shrinker WOUND #1: - Amputation Site - Below Knee Wound Laterality: Right Peri-Wound Care: AandD Ointment 3 x Per Week/30 Days Discharge Instructions: wound edges Primary Dressing: Prisma 4.34 (in) (Generic) 3 x Per Week/30 Days Discharge Instructions: Moisten w/normal saline or sterile water; Cover wound as directed. Do not remove from wound bed. Secondary Dressing: Gauze 3 x Per Week/30 Days Secured With: Medipore Tape - 26M Medipore H Soft Cloth Surgical Tape, 2x2 (in/yd) 3 x Per Week/30 Days 1. I am good recommend that we going continue with wound care measures as before and the patient is in agreement with plan. This includes the use of the AandD ointment around the edges of the wound which I think is doing quite well. 2. We will also continue with the collagen to the wound bed which I think is doing excellent. 3. I am also going to suggest the patient should continue with the otherwise wound care measures as before with regard to using her shrinker and a gauze and tape dressing which I think is doing quite well.  We will see patient back for reevaluation in 1 week here in the clinic. If anything worsens or changes patient will contact our office for  additional recommendations. Electronic Signature(s) Signed: 06/19/2022 8:27:49 AM By: Lenda Kelp PA-C Entered By: Lenda Kelp on 06/19/2022 08:27:49 [name] ([id number]) -------------------------------------------------------------------------------- SuperBill Details Patient Name: [name] Date of Service: 06/19/2022 Medical Record Number: [id number] Patient Account Number: [id number] Date of Birth/Sex: May 12, 1986 (35 y.o. F) Treating RN: Yevonne Pax Primary Care Provider: Lynnell Chad Other Clinician: Referring Provider: Lynnell Chad Treating Provider/Extender: Rowan Blase in Treatment: 20 Diagnosis Coding ICD-10 Codes Code Description E11.622 Type 2 diabetes mellitus with other skin ulcer T81.31XA Disruption of external operation (surgical) wound, not elsewhere classified, initial encounter L97.812 Non-pressure chronic ulcer of other part of right lower leg with fat layer exposed E11.43 Type 2 diabetes mellitus with diabetic autonomic (poly)neuropathy I10 Essential (primary) hypertension Facility Procedures CPT4 Code: 65784696 Description: 11042 - DEB SUBQ TISSUE 20 SQ CM/< Modifier: Quantity: 1 CPT4 Code: Description: ICD-10 Diagnosis Description L97.812 Non-pressure chronic ulcer of other part of right lower leg with fat laye Modifier: r exposed Quantity: Physician Procedures CPT4 Code: 2952841 Description: 11042 - WC PHYS SUBQ TISS 20 SQ CM Modifier: Quantity: 1 CPT4 Code: Description: ICD-10 Diagnosis Description L97.812 Non-pressure chronic ulcer of other part of right lower leg with fat laye Modifier: r exposed Quantity: Electronic Signature(s) Signed: 06/19/2022 8:28:00 AM By: Lenda Kelp PA-C Entered By: Lenda Kelp on 06/19/2022 08:28:00

## 2022-06-19 NOTE — Progress Notes (Signed)
[name], [name] ([id number]) Visit Report for 06/19/2022 Arrival Information Details Patient Name: [name], [name] Date of Service: 06/19/2022 8:00 AM Medical Record Number: [id number] Patient Account Number: [id number] Date of Birth/Sex: 20-Feb-1986 (36 y.o. F) Treating RN: Carlene Coria Primary Care Pacey Willadsen: Jac Canavan Other Clinician: Referring Christipher Rieger: Jac Canavan Treating Vicki Pasqual/Extender: Skipper Cliche in Treatment: 81 Visit Information History Since Last Visit All ordered tests and consults were completed: No Patient Arrived: Wheel Chair Added or deleted any medications: No Arrival Time: 08:09 Any new allergies or adverse reactions: No Accompanied By: self Had a fall or experienced change in No Transfer Assistance: None activities of daily living that may affect Patient Identification Verified: Yes risk of falls: Secondary Verification Process Completed: Yes Signs or symptoms of abuse/neglect since last visito No Patient Requires Transmission-Based Precautions: No Hospitalized since last visit: No Patient Has Alerts: Yes Implantable device outside of the clinic excluding No cellular tissue based products placed in the center since last visit: Has Dressing in Place as Prescribed: Yes Pain Present Now: No Electronic Signature(s) Signed: 06/19/2022 2:31:54 PM By: Carlene Coria RN Entered By: Carlene Coria on 06/19/2022 08:09:56 [name] ([id number]) -------------------------------------------------------------------------------- Clinic Level of Care Assessment Details Patient Name: [name] Date of Service: 06/19/2022 8:00 AM Medical Record Number: [id number] Patient Account Number: [id number] Date of Birth/Sex: [birth year] (35 y.o. F) Treating RN: Carlene Coria Primary Care Lorrine Killilea: Jac Canavan Other Clinician: Referring Madelena Maturin: Jac Canavan Treating Jeannie Mallinger/Extender: Skipper Cliche in Treatment: 20 Clinic Level of Care Assessment  Items TOOL 1 Quantity Score '[]'  - Use when EandM and Procedure is performed on INITIAL visit 0 ASSESSMENTS - Nursing Assessment / Reassessment '[]'  - General Physical Exam (combine w/ comprehensive assessment (listed just below) when performed on new 0 pt. evals) '[]'  - 0 Comprehensive Assessment (HX, ROS, Risk Assessments, Wounds Hx, etc.) ASSESSMENTS - Wound and Skin Assessment / Reassessment '[]'  - Dermatologic / Skin Assessment (not related to wound area) 0 ASSESSMENTS - Ostomy and/or Continence Assessment and Care '[]'  - Incontinence Assessment and Management 0 '[]'  - 0 Ostomy Care Assessment and Management (repouching, etc.) PROCESS - Coordination of Care '[]'  - Simple Patient / Family Education for ongoing care 0 '[]'  - 0 Complex (extensive) Patient / Family Education for ongoing care '[]'  - 0 Staff obtains Programmer, systems, Records, Test Results / Process Orders '[]'  - 0 Staff telephones HHA, Nursing Homes / Clarify orders / etc '[]'  - 0 Routine Transfer to another Facility (non-emergent condition) '[]'  - 0 Routine Hospital Admission (non-emergent condition) '[]'  - 0 New Admissions / Biomedical engineer / Ordering NPWT, Apligraf, etc. '[]'  - 0 Emergency Hospital Admission (emergent condition) PROCESS - Special Needs '[]'  - Pediatric / Minor Patient Management 0 '[]'  - 0 Isolation Patient Management '[]'  - 0 Hearing / Language / Visual special needs '[]'  - 0 Assessment of Community assistance (transportation, D/C planning, etc.) '[]'  - 0 Additional assistance / Altered mentation '[]'  - 0 Support Surface(s) Assessment (bed, cushion, seat, etc.) INTERVENTIONS - Miscellaneous '[]'  - External ear exam 0 '[]'  - 0 Patient Transfer (multiple staff / Civil Service fast streamer / Similar devices) '[]'  - 0 Simple Staple / Suture removal (25 or less) '[]'  - 0 Complex Staple / Suture removal (26 or more) '[]'  - 0 Hypo/Hyperglycemic Management (do not check if billed separately) '[]'  - 0 Ankle / Brachial Index (ABI) - do not check if  billed separately Has the patient been seen at the hospital within the last three years: Yes Total Score: 0 Level Of Care: ____  [name] ([id number]) Electronic Signature(s) Signed: 06/19/2022 2:31:54 PM By: Carlene Coria RN Entered By: Carlene Coria on 06/19/2022 08:25:15 [name] ([id number]) -------------------------------------------------------------------------------- Encounter Discharge Information Details Patient Name: [name] Date of Service: 06/19/2022 8:00 AM Medical Record Number: [id number] Patient Account Number: [id number] Date of Birth/Sex: 08/15/1986 (35 y.o. F) Treating RN: Carlene Coria Primary Care Natasa Stigall: Jac Canavan Other Clinician: Referring Charlet Harr: Jac Canavan Treating Janeli Lewison/Extender: Skipper Cliche in Treatment: 20 Encounter Discharge Information Items Post Procedure Vitals Discharge Condition: Stable Temperature (F): 98.3 Ambulatory Status: Wheelchair Pulse (bpm): 112 Discharge Destination: Home Respiratory Rate (breaths/min): 18 Transportation: Private Auto Blood Pressure (mmHg): 124/83 Accompanied By: self Schedule Follow-up Appointment: Yes Clinical Summary of Care: Electronic Signature(s) Signed: 06/19/2022 2:31:54 PM By: Carlene Coria RN Entered By: Carlene Coria on 06/19/2022 08:26:31 [name] ([id number]) -------------------------------------------------------------------------------- Lower Extremity Assessment Details Patient Name: [name] Date of Service: 06/19/2022 8:00 AM Medical Record Number: [id number] Patient Account Number: [id number] Date of Birth/Sex: Apr 09, 1986 (35 y.o. F) Treating RN: Carlene Coria Primary Care Agapito Hanway: Jac Canavan Other Clinician: Referring Jarryn Altland: Jac Canavan Treating Christina Waldrop/Extender: Skipper Cliche in Treatment: 20 Electronic Signature(s) Signed: 06/19/2022 2:31:54 PM By: Carlene Coria RN Entered By: Carlene Coria on 06/19/2022 08:11:03 [name]  ([id number]) -------------------------------------------------------------------------------- Multi Wound Chart Details Patient Name: [name] Date of Service: 06/19/2022 8:00 AM Medical Record Number: [id number] Patient Account Number: [id number] Date of Birth/Sex: 1986-07-19 (35 y.o. F) Treating RN: Carlene Coria Primary Care Randell Teare: Jac Canavan Other Clinician: Referring Howell Groesbeck: Jac Canavan Treating Fedor Kazmierski/Extender: Skipper Cliche in Treatment: 20 Vital Signs Height(in): 67 Pulse(bpm): 112 Weight(lbs): 219 Blood Pressure(mmHg): 124/83 Body Mass Index(BMI): 34.3 Temperature(F): 98.3 Respiratory Rate(breaths/min): 18 Photos: [N/A:N/A] Wound Location: Right Amputation Site - Below Knee N/A N/A Wounding Event: Gradually Appeared N/A N/A Primary Etiology: Abscess N/A N/A Comorbid History: Hypertension, Type II Diabetes N/A N/A Date Acquired: 01/14/2022 N/A N/A Weeks of Treatment: 20 N/A N/A Wound Status: Open N/A N/A Wound Recurrence: No N/A N/A Measurements L x W x D (cm) 0.7x1.8x0.1 N/A N/A Area (cm) : 0.99 N/A N/A Volume (cm) : 0.099 N/A N/A % Reduction in Area: 96.30% N/A N/A % Reduction in Volume: 99.90% N/A N/A Classification: Full Thickness With Exposed N/A N/A Support Structures Exudate Amount: Medium N/A N/A Exudate Type: Serosanguineous N/A N/A Exudate Color: red, brown N/A N/A Wound Margin: Flat and Intact N/A N/A Granulation Amount: Large (67-100%) N/A N/A Granulation Quality: Red N/A N/A Necrotic Amount: None Present (0%) N/A N/A Exposed Structures: Fat Layer (Subcutaneous Tissue): N/A N/A Yes Fascia: No Tendon: No Muscle: No Joint: No Bone: No Epithelialization: Small (1-33%) N/A N/A Treatment Notes Electronic Signature(s) Signed: 06/19/2022 2:31:54 PM By: Carlene Coria RN Entered By: Carlene Coria on 06/19/2022 08:11:13 [name]  ([id number]) -------------------------------------------------------------------------------- [place] Details Patient Name: [name] Date of Service: 06/19/2022 8:00 AM Medical Record Number: [id number] Patient Account Number: [id number] Date of Birth/Sex: 1986-09-16 (35 y.o. F) Treating RN: Carlene Coria Primary Care Weston Kallman: Jac Canavan Other Clinician: Referring Fran Neiswonger: Jac Canavan Treating Aleece Loyd/Extender: Skipper Cliche in Treatment: 20 Active Inactive Wound/Skin Impairment Nursing Diagnoses: Knowledge deficit related to ulceration/compromised skin integrity Goals: Patient/caregiver will verbalize understanding of skin care regimen Date Initiated: 01/27/2022 Target Resolution Date: 05/25/2022 Goal Status: Active Ulcer/skin breakdown will have a volume reduction of 30% by week 4 Date Initiated: 01/27/2022 Date Inactivated: 04/24/2022 Target Resolution Date: 03/29/2022 Goal Status: Met Ulcer/skin breakdown will have a volume reduction of 50% by week 8 Date Initiated: 01/27/2022 Date Inactivated: 04/24/2022  Target Resolution Date: 04/28/2022 Goal Status: Met Ulcer/skin breakdown will have a volume reduction of 80% by week 12 Date Initiated: 01/27/2022 Target Resolution Date: 05/29/2022 Goal Status: Active Ulcer/skin breakdown will heal within 14 weeks Date Initiated: 01/27/2022 Target Resolution Date: 06/29/2022 Goal Status: Active Interventions: Assess patient/caregiver ability to obtain necessary supplies Assess patient/caregiver ability to perform ulcer/skin care regimen upon admission and as needed Assess ulceration(s) every visit Notes: Electronic Signature(s) Signed: 06/19/2022 2:31:54 PM By: Carlene Coria RN Entered By: Carlene Coria on 06/19/2022 08:11:07 [name] ([id number]) -------------------------------------------------------------------------------- Pain Assessment Details Patient Name: [name] Date of Service:  06/19/2022 8:00 AM Medical Record Number: [id number] Patient Account Number: [id number] Date of Birth/Sex: May 27, 1986 (36 y.o. F) Treating RN: Carlene Coria Primary Care Jaclin Finks: Jac Canavan Other Clinician: Referring Tarig Zimmers: Jac Canavan Treating Avonna Iribe/Extender: Skipper Cliche in Treatment: 20 Active Problems Location of Pain Severity and Description of Pain Patient Has Paino No Site Locations Pain Management and Medication Current Pain Management: Electronic Signature(s) Signed: 06/19/2022 2:31:54 PM By: Carlene Coria RN Entered By: Carlene Coria on 06/19/2022 08:10:24 [name] ([id number]) -------------------------------------------------------------------------------- Patient/Caregiver Education Details Patient Name: [name] Date of Service: 06/19/2022 8:00 AM Medical Record Number: [id number] Patient Account Number: [id number] Date of Birth/Gender: [birth year] (35 y.o. F) Treating RN: Carlene Coria Primary Care Physician: Jac Canavan Other Clinician: Referring Physician: Jac Canavan Treating Physician/Extender: Skipper Cliche in Treatment: 20 Education Assessment Education Provided To: Patient Education Topics Provided Wound/Skin Impairment: Methods: Explain/Verbal Responses: State content correctly Electronic Signature(s) Signed: 06/19/2022 2:31:54 PM By: Carlene Coria RN Entered By: Carlene Coria on 06/19/2022 08:25:51 [name] ([id number]) -------------------------------------------------------------------------------- Wound Assessment Details Patient Name: [name] Date of Service: 06/19/2022 8:00 AM Medical Record Number: [id number] Patient Account Number: [id number] Date of Birth/Sex: [birth year] (35 y.o. F) Treating RN: Carlene Coria Primary Care Daryel Kenneth: Jac Canavan Other Clinician: Referring Tyquisha Sharps: Jac Canavan Treating Kashus Karlen/Extender: Skipper Cliche in Treatment: 20 Wound Status Wound Number: 1 Primary  Etiology: Abscess Wound Location: Right Amputation Site - Below Knee Wound Status: Open Wounding Event: Gradually Appeared Comorbid History: Hypertension, Type II Diabetes Date Acquired: 01/14/2022 Weeks Of Treatment: 20 Clustered Wound: No Photos Wound Measurements Length: (cm) 0.7 Width: (cm) 1.8 Depth: (cm) 0.1 Area: (cm) 0.99 Volume: (cm) 0.099 % Reduction in Area: 96.3% % Reduction in Volume: 99.9% Epithelialization: Small (1-33%) Tunneling: No Undermining: No Wound Description Classification: Full Thickness With Exposed Support Structures Wound Margin: Flat and Intact Exudate Amount: Medium Exudate Type: Serosanguineous Exudate Color: red, brown Foul Odor After Cleansing: No Slough/Fibrino No Wound Bed Granulation Amount: Large (67-100%) Exposed Structure Granulation Quality: Red Fascia Exposed: No Necrotic Amount: None Present (0%) Fat Layer (Subcutaneous Tissue) Exposed: Yes Tendon Exposed: No Muscle Exposed: No Joint Exposed: No Bone Exposed: No Treatment Notes Wound #1 (Amputation Site - Below Knee) Wound Laterality: Right Cleanser Peri-Wound Care AandD Ointment Discharge Instruction: wound edges Klima, Sherisa ([id number]) Topical Primary Dressing Prisma 4.34 (in) Discharge Instruction: Moisten w/normal saline or sterile water; Cover wound as directed. Do not remove from wound bed. Secondary Dressing Gauze Secured With Medipore Tape - 47M Medipore H Soft Cloth Surgical Tape, 2x2 (in/yd) Compression Wrap Compression Stockings Add-Ons Electronic Signature(s) Signed: 06/19/2022 2:31:54 PM By: Carlene Coria RN Entered By: Carlene Coria on 06/19/2022 08:10:52 [name] ([id number]) -------------------------------------------------------------------------------- Vitals Details Patient Name: [name] Date of Service: 06/19/2022 8:00 AM Medical Record Number: [id number] Patient Account Number: [id number] Date of Birth/Sex: [birth year] (35 y.o.  F) Treating RN: Carlene Coria Primary Care Vennela Jutte: Jac Canavan  Other Clinician: Referring Latashia Koch: Jac Canavan Treating Tiasha Helvie/Extender: Skipper Cliche in Treatment: 20 Vital Signs Time Taken: 08:00 Temperature (F): 98.3 Height (in): 67 Pulse (bpm): 112 Weight (lbs): 219 Respiratory Rate (breaths/min): 18 Body Mass Index (BMI): 34.3 Blood Pressure (mmHg): 124/83 Reference Range: 80 - 120 mg / dl Electronic Signature(s) Signed: 06/19/2022 2:31:54 PM By: Carlene Coria RN Entered By: Carlene Coria on 06/19/2022 08:10:18

## 2022-06-20 ENCOUNTER — Ambulatory Visit: Payer: Medicaid Other | Admitting: Physician Assistant

## 2022-06-26 ENCOUNTER — Encounter: Payer: Medicaid Other | Admitting: Physician Assistant

## 2022-06-26 DIAGNOSIS — E11622 Type 2 diabetes mellitus with other skin ulcer: Secondary | ICD-10-CM | POA: Diagnosis not present

## 2022-06-26 NOTE — Progress Notes (Signed)
ZOELLA, ROBERTI ([id number]) Visit Report for 06/26/2022 Chief Complaint Document Details Patient Name: [name], [name] Date of Service: 06/26/2022 8:00 AM Medical Record Number: [id number] Patient Account Number: [id number] Date of Birth/Sex: 1986-08-08 (36 y.o. F) Treating RN: Yevonne Pax Primary Care Provider: Lynnell Chad Other Clinician: Referring Provider: Lynnell Chad Treating Provider/Extender: Rowan Blase in Treatment: 21 Information Obtained from: Patient Chief Complaint Right BKA Amputation Site Disruption Electronic Signature(s) Signed: 06/26/2022 12:16:04 PM By: Lenda Kelp PA-C Entered By: Lenda Kelp on 06/26/2022 08:17:44 [name] ([id number]) -------------------------------------------------------------------------------- Debridement Details Patient Name: [name] Date of Service: 06/26/2022 8:00 AM Medical Record Number: [id number] Patient Account Number: [id number] Date of Birth/Sex: 03/01/1986 (35 y.o. F) Treating RN: Yevonne Pax Primary Care Provider: Lynnell Chad Other Clinician: Referring Provider: Lynnell Chad Treating Provider/Extender: Rowan Blase in Treatment: 21 Debridement Performed for Wound #1 Right Amputation Site - Below Knee Assessment: Performed By: Physician Nelida Meuse., PA-C Debridement Type: Debridement Level of Consciousness (Pre- Awake and Alert procedure): Pre-procedure Verification/Time Out Yes - 08:23 Taken: Start Time: 08:23 Total Area Debrided (L x W): 0.4 (cm) x 1.5 (cm) = 0.6 (cm) Tissue and other material Viable, Non-Viable, Slough, Subcutaneous, Skin: Dermis , Biofilm, Slough debrided: Level: Skin/Subcutaneous Tissue Debridement Description: Excisional Instrument: Curette Bleeding: Minimum Hemostasis Achieved: Pressure End Time: 08:25 Procedural Pain: 0 Post Procedural Pain: 0 Response to Treatment: Procedure was tolerated well Level of Consciousness (Post- Awake and  Alert procedure): Post Debridement Measurements of Total Wound Length: (cm) 0.4 Width: (cm) 1.5 Depth: (cm) 0.1 Volume: (cm) 0.047 Character of Wound/Ulcer Post Debridement: Improved Post Procedure Diagnosis Same as Pre-procedure Electronic Signature(s) Signed: 06/26/2022 12:16:04 PM By: Lenda Kelp PA-C Signed: 06/26/2022 4:20:15 PM By: Yevonne Pax RN Entered By: Yevonne Pax on 06/26/2022 08:25:15 [name] ([id number]) -------------------------------------------------------------------------------- HPI Details Patient Name: [name] Date of Service: 06/26/2022 8:00 AM Medical Record Number: [id number] Patient Account Number: [id number] Date of Birth/Sex: 1986/05/02 (35 y.o. F) Treating RN: Yevonne Pax Primary Care Provider: Lynnell Chad Other Clinician: Referring Provider: Lynnell Chad Treating Provider/Extender: Rowan Blase in Treatment: 21 History of Present Illness HPI Description: 01-27-2022 upon evaluation today patient appears to be doing somewhat poorly in regard to her right below-knee amputation. This unfortunately developed a abscess internally following the initial amputation which was January 2023. Subsequently she was getting very close to starting to work on the prosthesis when she had an area that somewhat bulged out ended up being an abscess and the incision and drainage was performed April 22. Subsequently she did have antibiotic beads placed in a wound VAC after this was cleared out. Her most recent reported hemoglobin A1c was 6.4. With that being said the patient tells me that her blood sugars seem to be under pretty good control as best she can tell at this point. She also tells me that the wound VAC seems to be functioning appropriately although upon evaluation the did not appear to be anything tucked into the region of undermining and depth in the roughly 9-12 o'clock location based on what we saw today. This is also where the  antibiotic beads are and I can see those in place. I do believe that she is getting need to have the wound VAC tucked down into this location. Patient does have a history of diabetes mellitus type 2, diabetic neuropathy, and hypertension. This initial amputation occurred as a result of her having sewing needles that actually went into her heel she did not know about it  started having trouble with her heel and ended up with a significant infection which was stated to be necrotizing and she subsequently had to have emergency amputation. 02-13-2022 upon evaluation today patient appears to be doing well with regard to her wound. She has been tolerating the dressing changes without complication. The wound VAC does seem to be doing a great job. Fortunately I do not see any evidence of active infection locally or systemically at this time which is great news. She still has some of the antibiotic beads in the base of the wound. 02-20-2022 upon evaluation today patient appears to be doing well currently. There does not appear to be any evidence of active infection locally nor systemically which is great news and overall I am extremely pleased with where we stand today. I do believe that the wound VAC is doing a good job here and the color of the drainage is improved again were unsure as to whether this may have been the antibiotic beads or if there was something such as Pseudomonas causing the greenish discoloration to the drainage either way she is doing significantly better. 03-06-2022 upon evaluation today patient appears to be doing well with regard to her wound. The only issue I see is that she does have a little bit of a pocket of fluid that collected in the deeper section everything else is actually measuring smaller and looks better. This was a point where I really did expect this was been a turnaround and start doing a lot better. I think we are especially seeing that right now. 03-20-2022 upon evaluation  today patient appears to be doing well with regard to her wound in fact this is actually significantly improved compared to last time I saw her. Fortunately I do not see any evidence of active infection at this time. No fevers, chills, nausea, vomiting, or diarrhea. 03-27-2022 upon evaluation today patient appears to be doing excellent in regard to her wound. This is showing signs of improvement which is great news and overall I am extremely pleased with where we stand currently. I do not see any evidence of active infection locally or systemically which is great news. No fevers, chills, nausea, vomiting, or diarrhea. 04-03-2022 upon evaluation today patient's wound is showing signs of significant improvement. I am still very pleased with where we stand and how things are going. I think that she is really making excellent progress which is great news. I do not see any signs of active infection at this time. 04-10-2022 upon evaluation today patient's wound actually showed signs of good granulation and epithelization at this point. Fortunately I do not see any evidence of active infection at this time which is great news and overall I am extremely pleased with where things stand currently. No fevers, chills, nausea, vomiting, or diarrhea. 04-17-2022 upon evaluation today patient appears to be doing well with regard to her wound she is actually showing signs of significant improvement which is great news. I do believe that she is making good progress with the current wound care measures. Fortunately I do not see any evidence of active infection locally or systemically which is great news. 7/24; right BKA site secondary to an abscess. Per our intake nurse wound VAC came off 2 weeks ago she has been using silver collagen. Nice improvements in wound dimensions. She does not have a prosthesis as of yet 05-01-2022 upon evaluation today patient appears to be doing well with regard to her wound in fact this is  measuring  significantly smaller and is really doing quite well. Fortunately I do not see any evidence of active infection locally or systemically at this time. 05-08-2022 upon evaluation today patient's wound is actually showing signs of excellent improvement. I am actually very pleased with where we stand I think that she is making great progress and I do not see any signs of active infection at this time which is great news. No fevers, chills, nausea, vomiting, or diarrhea. 05-15-2022 upon evaluation today patient appears to be doing well currently in regard to her wound. This is actually showing signs of excellent improvement is noted completely to the surface there is just a little bit of biofilm although we are can have to clear this away with sharp debridement as she is saline and gauze treatment is not enough to get this loosened today. 05-22-2022 upon evaluation today patient appears to be doing excellent in regard to her wound this is showing signs of excellent improvement the surgeon she saw just last week he was extremely pleased with how things were doing and in fact was shocked with the progress. 05-29-2022 upon evaluation today patient appears to be doing well currently in regard to her wound the wound does appear to have little bit of Decou, Joanann ([id number]) slough and biofilm on the surface of the wound that would not clearway today but the good news is she seems to be doing excellent otherwise. There does not appear to be any signs of active infection locally or systemically at this time. 06-06-2022 upon evaluation today patient appears to be doing well with regard to her wound the amputation site right leg. She has been tolerating the dressing changes without complication. Fortunately there does not appear to be any evidence of active infection locally or systemically at this time. 06-12-2022 upon evaluation today patient appears to be doing well currently in regard to her wound. She is  actually showing signs of improvement there is some slough and biofilm noted on the surface of the wound. Fortunately there does not appear to be any signs of active infection locally or systemically at this time which is great news. No fevers, chills, nausea, vomiting, or diarrhea. 06-19-2022 upon evaluation today patient appears to be doing well currently in regard to her wound this is actually measuring significantly smaller again and looks to be doing excellent. I see no signs of active infection at this time which is great news. 06-26-2022 upon evaluation today patient appears to be doing excellent in regard to her wound. She has been tolerating the dressing changes without complication. Fortunately there does not appear to be any evidence of active infection at this time and I think she is headed in the right direction. Electronic Signature(s) Signed: 06/26/2022 8:29:09 AM By: Lenda Kelp PA-C Entered By: Lenda Kelp on 06/26/2022 08:29:08 [name] ([id number]) -------------------------------------------------------------------------------- Physical Exam Details Patient Name: [name] Date of Service: 06/26/2022 8:00 AM Medical Record Number: [id number] Patient Account Number: [id number] Date of Birth/Sex: 1986-06-06 (35 y.o. F) Treating RN: Yevonne Pax Primary Care Provider: Lynnell Chad Other Clinician: Referring Provider: Lynnell Chad Treating Provider/Extender: Rowan Blase in Treatment: 21 Constitutional Well-nourished and well-hydrated in no acute distress. Respiratory normal breathing without difficulty. Psychiatric this patient is able to make decisions and demonstrates good insight into disease process. Alert and Oriented x 3. pleasant and cooperative. Notes Upon inspection patient's wound bed actually showed signs of good granulation and epithelization at this point. Fortunately I see no evidence of active infection  locally or systemically which  is great news and overall I am extremely pleased with where things are currently. Electronic Signature(s) Signed: 06/26/2022 8:29:26 AM By: Lenda Kelp PA-C Entered By: Lenda Kelp on 06/26/2022 08:29:26 [name] ([id number]) -------------------------------------------------------------------------------- Physician Orders Details Patient Name: [name] Date of Service: 06/26/2022 8:00 AM Medical Record Number: [id number] Patient Account Number: [id number] Date of Birth/Sex: September 13, 1986 (35 y.o. F) Treating RN: Yevonne Pax Primary Care Provider: Lynnell Chad Other Clinician: Referring Provider: Lynnell Chad Treating Provider/Extender: Rowan Blase in Treatment: 21 Verbal / Phone Orders: No Diagnosis Coding ICD-10 Coding Code Description E11.622 Type 2 diabetes mellitus with other skin ulcer T81.31XA Disruption of external operation (surgical) wound, not elsewhere classified, initial encounter L97.812 Non-pressure chronic ulcer of other part of right lower leg with fat layer exposed E11.43 Type 2 diabetes mellitus with diabetic autonomic (poly)neuropathy I10 Essential (primary) hypertension Follow-up Appointments o Return Appointment in 1 week. Bathing/ Shower/ Hygiene o May shower; gently cleanse wound with antibacterial soap, rinse and pat dry prior to dressing wounds Anesthetic (Use 'Patient Medications' Section for Anesthetic Order Entry) o Lidocaine applied to wound bed Edema Control - Lymphedema / Segmental Compressive Device / Other o Other: - stump shrinker Wound Treatment Wound #1 - Amputation Site - Below Knee Wound Laterality: Right Peri-Wound Care: AandD Ointment 3 x Per Week/30 Days Discharge Instructions: wound edges Primary Dressing: Prisma 4.34 (in) (Generic) 3 x Per Week/30 Days Discharge Instructions: Moisten w/normal saline or sterile water; Cover wound as directed. Do not remove from wound bed. Secondary Dressing: Gauze 3 x Per  Week/30 Days Secured With: Medipore Tape - 80M Medipore H Soft Cloth Surgical Tape, 2x2 (in/yd) 3 x Per Week/30 Days Electronic Signature(s) Signed: 06/26/2022 12:16:04 PM By: Lenda Kelp PA-C Signed: 06/26/2022 4:20:15 PM By: Yevonne Pax RN Entered By: Yevonne Pax on 06/26/2022 08:25:35 [name] ([id number]) -------------------------------------------------------------------------------- Problem List Details Patient Name: [name] Date of Service: 06/26/2022 8:00 AM Medical Record Number: [id number] Patient Account Number: [id number] Date of Birth/Sex: June 06, 1986 (35 y.o. F) Treating RN: Yevonne Pax Primary Care Provider: Lynnell Chad Other Clinician: Referring Provider: Lynnell Chad Treating Provider/Extender: Rowan Blase in Treatment: 21 Active Problems ICD-10 Encounter Code Description Active Date MDM Diagnosis E11.622 Type 2 diabetes mellitus with other skin ulcer 01/27/2022 No Yes T81.31XA Disruption of external operation (surgical) wound, not elsewhere 01/27/2022 No Yes classified, initial encounter L97.812 Non-pressure chronic ulcer of other part of right lower leg with fat layer 01/27/2022 No Yes exposed E11.43 Type 2 diabetes mellitus with diabetic autonomic (poly)neuropathy 01/27/2022 No Yes I10 Essential (primary) hypertension 01/27/2022 No Yes Inactive Problems Resolved Problems Electronic Signature(s) Signed: 06/26/2022 12:16:04 PM By: Lenda Kelp PA-C Entered By: Lenda Kelp on 06/26/2022 08:17:02 [name] ([id number]) -------------------------------------------------------------------------------- Progress Note Details Patient Name: [name] Date of Service: 06/26/2022 8:00 AM Medical Record Number: [id number] Patient Account Number: [id number] Date of Birth/Sex: [birth year] (35 y.o. F) Treating RN: Yevonne Pax Primary Care Provider: Lynnell Chad Other Clinician: Referring Provider: Lynnell Chad Treating  Provider/Extender: Rowan Blase in Treatment: 21 Subjective Chief Complaint Information obtained from Patient Right BKA Amputation Site Disruption History of Present Illness (HPI) 01-27-2022 upon evaluation today patient appears to be doing somewhat poorly in regard to her right below-knee amputation. This unfortunately developed a abscess internally following the initial amputation which was January 2023. Subsequently she was getting very close to starting to work on the prosthesis when she had an area that somewhat bulged out ended  up being an abscess and the incision and drainage was performed April 22. Subsequently she did have antibiotic beads placed in a wound VAC after this was cleared out. Her most recent reported hemoglobin A1c was 6.4. With that being said the patient tells me that her blood sugars seem to be under pretty good control as best she can tell at this point. She also tells me that the wound VAC seems to be functioning appropriately although upon evaluation the did not appear to be anything tucked into the region of undermining and depth in the roughly 9-12 o'clock location based on what we saw today. This is also where the antibiotic beads are and I can see those in place. I do believe that she is getting need to have the wound VAC tucked down into this location. Patient does have a history of diabetes mellitus type 2, diabetic neuropathy, and hypertension. This initial amputation occurred as a result of her having sewing needles that actually went into her heel she did not know about it started having trouble with her heel and ended up with a significant infection which was stated to be necrotizing and she subsequently had to have emergency amputation. 02-13-2022 upon evaluation today patient appears to be doing well with regard to her wound. She has been tolerating the dressing changes without complication. The wound VAC does seem to be doing a great job. Fortunately I  do not see any evidence of active infection locally or systemically at this time which is great news. She still has some of the antibiotic beads in the base of the wound. 02-20-2022 upon evaluation today patient appears to be doing well currently. There does not appear to be any evidence of active infection locally nor systemically which is great news and overall I am extremely pleased with where we stand today. I do believe that the wound VAC is doing a good job here and the color of the drainage is improved again were unsure as to whether this may have been the antibiotic beads or if there was something such as Pseudomonas causing the greenish discoloration to the drainage either way she is doing significantly better. 03-06-2022 upon evaluation today patient appears to be doing well with regard to her wound. The only issue I see is that she does have a little bit of a pocket of fluid that collected in the deeper section everything else is actually measuring smaller and looks better. This was a point where I really did expect this was been a turnaround and start doing a lot better. I think we are especially seeing that right now. 03-20-2022 upon evaluation today patient appears to be doing well with regard to her wound in fact this is actually significantly improved compared to last time I saw her. Fortunately I do not see any evidence of active infection at this time. No fevers, chills, nausea, vomiting, or diarrhea. 03-27-2022 upon evaluation today patient appears to be doing excellent in regard to her wound. This is showing signs of improvement which is great news and overall I am extremely pleased with where we stand currently. I do not see any evidence of active infection locally or systemically which is great news. No fevers, chills, nausea, vomiting, or diarrhea. 04-03-2022 upon evaluation today patient's wound is showing signs of significant improvement. I am still very pleased with where we stand  and how things are going. I think that she is really making excellent progress which is great news. I do not see  any signs of active infection at this time. 04-10-2022 upon evaluation today patient's wound actually showed signs of good granulation and epithelization at this point. Fortunately I do not see any evidence of active infection at this time which is great news and overall I am extremely pleased with where things stand currently. No fevers, chills, nausea, vomiting, or diarrhea. 04-17-2022 upon evaluation today patient appears to be doing well with regard to her wound she is actually showing signs of significant improvement which is great news. I do believe that she is making good progress with the current wound care measures. Fortunately I do not see any evidence of active infection locally or systemically which is great news. 7/24; right BKA site secondary to an abscess. Per our intake nurse wound VAC came off 2 weeks ago she has been using silver collagen. Nice improvements in wound dimensions. She does not have a prosthesis as of yet 05-01-2022 upon evaluation today patient appears to be doing well with regard to her wound in fact this is measuring significantly smaller and is really doing quite well. Fortunately I do not see any evidence of active infection locally or systemically at this time. 05-08-2022 upon evaluation today patient's wound is actually showing signs of excellent improvement. I am actually very pleased with where we stand I think that she is making great progress and I do not see any signs of active infection at this time which is great news. No fevers, chills, nausea, vomiting, or diarrhea. 05-15-2022 upon evaluation today patient appears to be doing well currently in regard to her wound. This is actually showing signs of excellent improvement is noted completely to the surface there is just a little bit of biofilm although we are can have to clear this away with  sharp debridement as she is saline and gauze treatment is not enough to get this loosened today. [name], [name] ([id number]) 05-22-2022 upon evaluation today patient appears to be doing excellent in regard to her wound this is showing signs of excellent improvement the surgeon she saw just last week he was extremely pleased with how things were doing and in fact was shocked with the progress. 05-29-2022 upon evaluation today patient appears to be doing well currently in regard to her wound the wound does appear to have little bit of slough and biofilm on the surface of the wound that would not clearway today but the good news is she seems to be doing excellent otherwise. There does not appear to be any signs of active infection locally or systemically at this time. 06-06-2022 upon evaluation today patient appears to be doing well with regard to her wound the amputation site right leg. She has been tolerating the dressing changes without complication. Fortunately there does not appear to be any evidence of active infection locally or systemically at this time. 06-12-2022 upon evaluation today patient appears to be doing well currently in regard to her wound. She is actually showing signs of improvement there is some slough and biofilm noted on the surface of the wound. Fortunately there does not appear to be any signs of active infection locally or systemically at this time which is great news. No fevers, chills, nausea, vomiting, or diarrhea. 06-19-2022 upon evaluation today patient appears to be doing well currently in regard to her wound this is actually measuring significantly smaller again and looks to be doing excellent. I see no signs of active infection at this time which is great news. 06-26-2022 upon evaluation today patient appears  to be doing excellent in regard to her wound. She has been tolerating the dressing changes without complication. Fortunately there does not appear to be any evidence of  active infection at this time and I think she is headed in the right direction. Objective Constitutional Well-nourished and well-hydrated in no acute distress. Vitals Time Taken: 8:00 AM, Height: 67 in, Weight: 219 lbs, BMI: 34.3, Temperature: 98.5 F, Pulse: 114 bpm, Respiratory Rate: 16 breaths/min, Blood Pressure: 115/77 mmHg. Respiratory normal breathing without difficulty. Psychiatric this patient is able to make decisions and demonstrates good insight into disease process. Alert and Oriented x 3. pleasant and cooperative. General Notes: Upon inspection patient's wound bed actually showed signs of good granulation and epithelization at this point. Fortunately I see no evidence of active infection locally or systemically which is great news and overall I am extremely pleased with where things are currently. Integumentary (Hair, Skin) Wound #1 status is Open. Original cause of wound was Gradually Appeared. The date acquired was: 01/14/2022. The wound has been in treatment 21 weeks. The wound is located on the Right Amputation Site - Below Knee. The wound measures 0.4cm length x 1.5cm width x 0.1cm depth; 0.471cm^2 area and 0.047cm^3 volume. There is Fat Layer (Subcutaneous Tissue) exposed. There is no tunneling or undermining noted. There is a medium amount of serosanguineous drainage noted. The wound margin is flat and intact. There is large (67-100%) red granulation within the wound bed. There is no necrotic tissue within the wound bed. Assessment Active Problems ICD-10 Type 2 diabetes mellitus with other skin ulcer Disruption of external operation (surgical) wound, not elsewhere classified, initial encounter Non-pressure chronic ulcer of other part of right lower leg with fat layer exposed Type 2 diabetes mellitus with diabetic autonomic (poly)neuropathy Essential (primary) hypertension Burggraf, Allison ([id number]) Procedures Wound #1 Pre-procedure diagnosis of Wound #1 is an  Abscess located on the Right Amputation Site - Below Knee . There was a Excisional Skin/Subcutaneous Tissue Debridement with a total area of 0.6 sq cm performed by Nelida Meuse., PA-C. With the following instrument(s): Curette to remove Viable and Non-Viable tissue/material. Material removed includes Subcutaneous Tissue, Slough, Skin: Dermis, and Biofilm. No specimens were taken. A time out was conducted at 08:23, prior to the start of the procedure. A Minimum amount of bleeding was controlled with Pressure. The procedure was tolerated well with a pain level of 0 throughout and a pain level of 0 following the procedure. Post Debridement Measurements: 0.4cm length x 1.5cm width x 0.1cm depth; 0.047cm^3 volume. Character of Wound/Ulcer Post Debridement is improved. Post procedure Diagnosis Wound #1: Same as Pre-Procedure Plan Follow-up Appointments: Return Appointment in 1 week. Bathing/ Shower/ Hygiene: May shower; gently cleanse wound with antibacterial soap, rinse and pat dry prior to dressing wounds Anesthetic (Use 'Patient Medications' Section for Anesthetic Order Entry): Lidocaine applied to wound bed Edema Control - Lymphedema / Segmental Compressive Device / Other: Other: - stump shrinker WOUND #1: - Amputation Site - Below Knee Wound Laterality: Right Peri-Wound Care: AandD Ointment 3 x Per Week/30 Days Discharge Instructions: wound edges Primary Dressing: Prisma 4.34 (in) (Generic) 3 x Per Week/30 Days Discharge Instructions: Moisten w/normal saline or sterile water; Cover wound as directed. Do not remove from wound bed. Secondary Dressing: Gauze 3 x Per Week/30 Days Secured With: Medipore Tape - 43M Medipore H Soft Cloth Surgical Tape, 2x2 (in/yd) 3 x Per Week/30 Days 1. I am going to suggest that we go ahead and continue with the recommendation for wound  care measures as before and the patient is in agreement with plan this includes the use of the silver collagen which I think is  doing an awesome job. 2. I am also can recommend that we have the patient continue to monitor for any signs of worsening or infection if anything changes she should contact the office and let me know. We will see patient back for reevaluation in 1 week here in the clinic. If anything worsens or changes patient will contact our office for additional recommendations. Electronic Signature(s) Signed: 06/26/2022 8:29:55 AM By: Lenda KelpStone III, Ivey Nembhard PA-C Entered By: Lenda KelpStone III, Leonid Manus on 06/26/2022 08:29:55 [name], [name] ([id number]) -------------------------------------------------------------------------------- SuperBill Details Patient Name: [name], [name] Date of Service: 06/26/2022 Medical Record Number: [id number] Patient Account Number: [id number] Date of Birth/Sex: May 08, 1986 (35 y.o. F) Treating RN: Yevonne PaxEpps, Carrie Primary Care Provider: Lynnell ChadIjaola, Onyeje Other Clinician: Referring Provider: Lynnell ChadIjaola, Onyeje Treating Provider/Extender: Rowan BlaseStone, Nadim Malia Weeks in Treatment: 21 Diagnosis Coding ICD-10 Codes Code Description E11.622 Type 2 diabetes mellitus with other skin ulcer T81.31XA Disruption of external operation (surgical) wound, not elsewhere classified, initial encounter L97.812 Non-pressure chronic ulcer of other part of right lower leg with fat layer exposed E11.43 Type 2 diabetes mellitus with diabetic autonomic (poly)neuropathy I10 Essential (primary) hypertension Facility Procedures CPT4 Code: [id number] Description: 11042 - DEB SUBQ TISSUE 20 SQ CM/< Modifier: Quantity: 1 CPT4 Code: Description: ICD-10 Diagnosis Description L97.812 Non-pressure chronic ulcer of other part of right lower leg with fat laye Modifier: r exposed Quantity: Physician Procedures CPT4 Code: [id number] Description: 11042 - WC PHYS SUBQ TISS 20 SQ CM Modifier: Quantity: 1 CPT4 Code: Description: ICD-10 Diagnosis Description L97.812 Non-pressure chronic ulcer of other part of right lower leg with fat laye Modifier:  r exposed Quantity: Electronic Signature(s) Signed: 06/26/2022 8:30:20 AM By: Lenda KelpStone III, Sanaya Gwilliam PA-C Previous Signature: 06/26/2022 8:30:09 AM Version By: Lenda KelpStone III, Zayley Arras PA-C Entered By: Lenda KelpStone III, Vanecia Limpert on 06/26/2022 08:30:20

## 2022-06-26 NOTE — Progress Notes (Signed)
ALAHIA, WHICKER ([id number]) Visit Report for 06/26/2022 Arrival Information Details Patient Name: [name], [name] Date of Service: 06/26/2022 8:00 AM Medical Record Number: [id number] Patient Account Number: [id number] Date of Birth/Sex: Jun 18, 1986 (36 y.o. F) Treating RN: Carlene Coria Primary Care Idalis Hoelting: Jac Canavan Other Clinician: Referring Rihana Kiddy: Jac Canavan Treating Marisue Canion/Extender: Skipper Cliche in Treatment: 21 Visit Information History Since Last Visit All ordered tests and consults were completed: No Patient Arrived: Wheel Chair Added or deleted any medications: No Arrival Time: 08:04 Any new allergies or adverse reactions: No Accompanied By: self Had a fall or experienced change in No Transfer Assistance: None activities of daily living that may affect Patient Identification Verified: Yes risk of falls: Secondary Verification Process Completed: Yes Signs or symptoms of abuse/neglect since last visito No Patient Requires Transmission-Based Precautions: No Hospitalized since last visit: No Patient Has Alerts: Yes Implantable device outside of the clinic excluding No cellular tissue based products placed in the center since last visit: Has Dressing in Place as Prescribed: Yes Pain Present Now: No Electronic Signature(s) Signed: 06/26/2022 4:20:15 PM By: Carlene Coria RN Entered By: Carlene Coria on 06/26/2022 08:05:16 [name] ([id number]) -------------------------------------------------------------------------------- Clinic Level of Care Assessment Details Patient Name: [name] Date of Service: 06/26/2022 8:00 AM Medical Record Number: [id number] Patient Account Number: [id number] Date of Birth/Sex: 07/01/1986 (35 y.o. F) Treating RN: Carlene Coria Primary Care Perkins Molina: Jac Canavan Other Clinician: Referring Spyridon Hornstein: Jac Canavan Treating Dorothe Elmore/Extender: Skipper Cliche in Treatment: 21 Clinic Level of Care Assessment  Items TOOL 1 Quantity Score '[]'  - Use when EandM and Procedure is performed on INITIAL visit 0 ASSESSMENTS - Nursing Assessment / Reassessment '[]'  - General Physical Exam (combine w/ comprehensive assessment (listed just below) when performed on new 0 pt. evals) '[]'  - 0 Comprehensive Assessment (HX, ROS, Risk Assessments, Wounds Hx, etc.) ASSESSMENTS - Wound and Skin Assessment / Reassessment '[]'  - Dermatologic / Skin Assessment (not related to wound area) 0 ASSESSMENTS - Ostomy and/or Continence Assessment and Care '[]'  - Incontinence Assessment and Management 0 '[]'  - 0 Ostomy Care Assessment and Management (repouching, etc.) PROCESS - Coordination of Care '[]'  - Simple Patient / Family Education for ongoing care 0 '[]'  - 0 Complex (extensive) Patient / Family Education for ongoing care '[]'  - 0 Staff obtains Programmer, systems, Records, Test Results / Process Orders '[]'  - 0 Staff telephones HHA, Nursing Homes / Clarify orders / etc '[]'  - 0 Routine Transfer to another Facility (non-emergent condition) '[]'  - 0 Routine Hospital Admission (non-emergent condition) '[]'  - 0 New Admissions / Biomedical engineer / Ordering NPWT, Apligraf, etc. '[]'  - 0 Emergency Hospital Admission (emergent condition) PROCESS - Special Needs '[]'  - Pediatric / Minor Patient Management 0 '[]'  - 0 Isolation Patient Management '[]'  - 0 Hearing / Language / Visual special needs '[]'  - 0 Assessment of Community assistance (transportation, D/C planning, etc.) '[]'  - 0 Additional assistance / Altered mentation '[]'  - 0 Support Surface(s) Assessment (bed, cushion, seat, etc.) INTERVENTIONS - Miscellaneous '[]'  - External ear exam 0 '[]'  - 0 Patient Transfer (multiple staff / Civil Service fast streamer / Similar devices) '[]'  - 0 Simple Staple / Suture removal (25 or less) '[]'  - 0 Complex Staple / Suture removal (26 or more) '[]'  - 0 Hypo/Hyperglycemic Management (do not check if billed separately) '[]'  - 0 Ankle / Brachial Index (ABI) - do not check if  billed separately Has the patient been seen at the hospital within the last three years: Yes Total Score: 0 Level Of Care: ____  SHENETTA, SCHNACKENBERG ([id number]) Electronic Signature(s) Signed: 06/26/2022 4:20:15 PM By: Carlene Coria RN Entered By: Carlene Coria on 06/26/2022 08:25:44 [name] ([id number]) -------------------------------------------------------------------------------- Encounter Discharge Information Details Patient Name: [name] Date of Service: 06/26/2022 8:00 AM Medical Record Number: [id number] Patient Account Number: [id number] Date of Birth/Sex: Jun 23, 1986 (35 y.o. F) Treating RN: Carlene Coria Primary Care Neema Barreira: Jac Canavan Other Clinician: Referring Mehmet Scally: Jac Canavan Treating Teran Knittle/Extender: Skipper Cliche in Treatment: 21 Encounter Discharge Information Items Post Procedure Vitals Discharge Condition: Stable Temperature (F): 98.5 Ambulatory Status: Wheelchair Pulse (bpm): 114 Discharge Destination: Home Respiratory Rate (breaths/min): 16 Transportation: Private Auto Blood Pressure (mmHg): 115/77 Accompanied By: self Schedule Follow-up Appointment: Yes Clinical Summary of Care: Electronic Signature(s) Signed: 06/26/2022 4:20:15 PM By: Carlene Coria RN Entered By: Carlene Coria on 06/26/2022 08:26:41 [name] ([id number]) -------------------------------------------------------------------------------- Lower Extremity Assessment Details Patient Name: [name] Date of Service: 06/26/2022 8:00 AM Medical Record Number: [id number] Patient Account Number: [id number] Date of Birth/Sex: 05/02/1986 (35 y.o. F) Treating RN: Carlene Coria Primary Care Clarivel Callaway: Jac Canavan Other Clinician: Referring Edlin Ford: Jac Canavan Treating Jakeisha Stricker/Extender: Skipper Cliche in Treatment: 21 Electronic Signature(s) Signed: 06/26/2022 4:20:15 PM By: Carlene Coria RN Entered By: Carlene Coria on 06/26/2022 08:10:33 [name]  ([id number]) -------------------------------------------------------------------------------- Multi Wound Chart Details Patient Name: [name] Date of Service: 06/26/2022 8:00 AM Medical Record Number: [id number] Patient Account Number: [id number] Date of Birth/Sex: 1985/12/20 (35 y.o. F) Treating RN: Carlene Coria Primary Care Daylyn Christine: Jac Canavan Other Clinician: Referring Evangelyn Crouse: Jac Canavan Treating Trella Thurmond/Extender: Skipper Cliche in Treatment: 21 Vital Signs Height(in): 67 Pulse(bpm): 114 Weight(lbs): 219 Blood Pressure(mmHg): 115/77 Body Mass Index(BMI): 34.3 Temperature(F): 98.5 Respiratory Rate(breaths/min): 16 Photos: [N/A:N/A] Wound Location: Right Amputation Site - Below Knee N/A N/A Wounding Event: Gradually Appeared N/A N/A Primary Etiology: Abscess N/A N/A Comorbid History: Hypertension, Type II Diabetes N/A N/A Date Acquired: 01/14/2022 N/A N/A Weeks of Treatment: 21 N/A N/A Wound Status: Open N/A N/A Wound Recurrence: No N/A N/A Measurements L x W x D (cm) 0.4x1.5x0.1 N/A N/A Area (cm) : 0.471 N/A N/A Volume (cm) : 0.047 N/A N/A % Reduction in Area: 98.30% N/A N/A % Reduction in Volume: 100.00% N/A N/A Classification: Full Thickness With Exposed N/A N/A Support Structures Exudate Amount: Medium N/A N/A Exudate Type: Serosanguineous N/A N/A Exudate Color: red, brown N/A N/A Wound Margin: Flat and Intact N/A N/A Granulation Amount: Large (67-100%) N/A N/A Granulation Quality: Red N/A N/A Necrotic Amount: None Present (0%) N/A N/A Exposed Structures: Fat Layer (Subcutaneous Tissue): N/A N/A Yes Fascia: No Tendon: No Muscle: No Joint: No Bone: No Epithelialization: Small (1-33%) N/A N/A Treatment Notes Electronic Signature(s) Signed: 06/26/2022 4:20:15 PM By: Carlene Coria RN Entered By: Carlene Coria on 06/26/2022 08:10:58 [name]  ([id number]) -------------------------------------------------------------------------------- Multi-Disciplinary Care Plan Details Patient Name: [name] Date of Service: 06/26/2022 8:00 AM Medical Record Number: [id number] Patient Account Number: [id number] Date of Birth/Sex: [birth year] (35 y.o. F) Treating RN: Carlene Coria Primary Care Karinda Cabriales: Jac Canavan Other Clinician: Referring Rhyli Depaula: Jac Canavan Treating Paelyn Smick/Extender: Skipper Cliche in Treatment: 21 Active Inactive Wound/Skin Impairment Nursing Diagnoses: Knowledge deficit related to ulceration/compromised skin integrity Goals: Patient/caregiver will verbalize understanding of skin care regimen Date Initiated: 01/27/2022 Target Resolution Date: 05/25/2022 Goal Status: Active Ulcer/skin breakdown will have a volume reduction of 30% by week 4 Date Initiated: 01/27/2022 Date Inactivated: 04/24/2022 Target Resolution Date: 03/29/2022 Goal Status: Met Ulcer/skin breakdown will have a volume reduction of 50% by week 8 Date Initiated: 01/27/2022 Date Inactivated: 04/24/2022  Target Resolution Date: 04/28/2022 Goal Status: Met Ulcer/skin breakdown will have a volume reduction of 80% by week 12 Date Initiated: 01/27/2022 Date Inactivated: 06/26/2022 Target Resolution Date: 05/29/2022 Goal Status: Met Ulcer/skin breakdown will heal within 14 weeks Date Initiated: 01/27/2022 Target Resolution Date: 06/29/2022 Goal Status: Active Interventions: Assess patient/caregiver ability to obtain necessary supplies Assess patient/caregiver ability to perform ulcer/skin care regimen upon admission and as needed Assess ulceration(s) every visit Notes: Electronic Signature(s) Signed: 06/26/2022 4:20:15 PM By: Carlene Coria RN Entered By: Carlene Coria on 06/26/2022 08:10:50 [name] ([id number]) -------------------------------------------------------------------------------- Pain Assessment Details Patient Name: [name] Date of Service: 06/26/2022 8:00 AM Medical Record Number: [id number] Patient Account Number: [id number] Date of Birth/Sex: October 30, 1985 (35 y.o. F) Treating RN: Carlene Coria Primary Care Saanvika Vazques: Jac Canavan Other Clinician: Referring Marybel Alcott: Jac Canavan Treating Gregoire Bennis/Extender: Skipper Cliche in Treatment: 21 Active Problems Location of Pain Severity and Description of Pain Patient Has Paino No Site Locations Pain Management and Medication Current Pain Management: Electronic Signature(s) Signed: 06/26/2022 4:20:15 PM By: Carlene Coria RN Entered By: Carlene Coria on 06/26/2022 08:05:37 [name] ([id number]) -------------------------------------------------------------------------------- Patient/Caregiver Education Details Patient Name: [name] Date of Service: 06/26/2022 8:00 AM Medical Record Number: [id number] Patient Account Number: [id number] Date of Birth/Gender: 12-Jun-1986 (35 y.o. F) Treating RN: Carlene Coria Primary Care Physician: Jac Canavan Other Clinician: Referring Physician: Jac Canavan Treating Physician/Extender: Skipper Cliche in Treatment: 21 Education Assessment Education Provided To: Patient Education Topics Provided Wound/Skin Impairment: Methods: Explain/Verbal Responses: State content correctly Electronic Signature(s) Signed: 06/26/2022 4:20:15 PM By: Carlene Coria RN Entered By: Carlene Coria on 06/26/2022 08:25:57 [name] ([id number]) -------------------------------------------------------------------------------- Wound Assessment Details Patient Name: [name] Date of Service: 06/26/2022 8:00 AM Medical Record Number: [id number] Patient Account Number: [id number] Date of Birth/Sex: 01/13/1986 (35 y.o. F) Treating RN: Carlene Coria Primary Care Tyrez Berrios: Jac Canavan Other Clinician: Referring Ericson Nafziger: Jac Canavan Treating Twana Wileman/Extender: Skipper Cliche in Treatment: 21 Wound  Status Wound Number: 1 Primary Etiology: Abscess Wound Location: Right Amputation Site - Below Knee Wound Status: Open Wounding Event: Gradually Appeared Comorbid History: Hypertension, Type II Diabetes Date Acquired: 01/14/2022 Weeks Of Treatment: 21 Clustered Wound: No Photos Wound Measurements Length: (cm) 0.4 Width: (cm) 1.5 Depth: (cm) 0.1 Area: (cm) 0.471 Volume: (cm) 0.047 % Reduction in Area: 98.3% % Reduction in Volume: 100% Epithelialization: Small (1-33%) Tunneling: No Undermining: No Wound Description Classification: Full Thickness With Exposed Support Structures Wound Margin: Flat and Intact Exudate Amount: Medium Exudate Type: Serosanguineous Exudate Color: red, brown Foul Odor After Cleansing: No Slough/Fibrino No Wound Bed Granulation Amount: Large (67-100%) Exposed Structure Granulation Quality: Red Fascia Exposed: No Necrotic Amount: None Present (0%) Fat Layer (Subcutaneous Tissue) Exposed: Yes Tendon Exposed: No Muscle Exposed: No Joint Exposed: No Bone Exposed: No Treatment Notes Wound #1 (Amputation Site - Below Knee) Wound Laterality: Right Cleanser Peri-Wound Care AandD Ointment Discharge Instruction: wound edges Haag, Adoria ([id number]) Topical Primary Dressing Prisma 4.34 (in) Discharge Instruction: Moisten w/normal saline or sterile water; Cover wound as directed. Do not remove from wound bed. Secondary Dressing Gauze Secured With Medipore Tape - 64M Medipore H Soft Cloth Surgical Tape, 2x2 (in/yd) Compression Wrap Compression Stockings Add-Ons Electronic Signature(s) Signed: 06/26/2022 4:20:15 PM By: Carlene Coria RN Entered By: Carlene Coria on 06/26/2022 08:10:23 [name] ([id number]) -------------------------------------------------------------------------------- Vitals Details Patient Name: [name] Date of Service: 06/26/2022 8:00 AM Medical Record Number: [id number] Patient Account Number: [id number] Date of  Birth/Sex: December 24, 1985 (35 y.o. F) Treating RN: Carlene Coria Primary Care  Azoria Abbett: Jac Canavan Other Clinician: Referring Bladen Umar: Jac Canavan Treating Tenille Morrill/Extender: Skipper Cliche in Treatment: 21 Vital Signs Time Taken: 08:00 Temperature (F): 98.5 Height (in): 67 Pulse (bpm): 114 Weight (lbs): 219 Respiratory Rate (breaths/min): 16 Body Mass Index (BMI): 34.3 Blood Pressure (mmHg): 115/77 Reference Range: 80 - 120 mg / dl Electronic Signature(s) Signed: 06/26/2022 4:20:15 PM By: Carlene Coria RN Entered By: Carlene Coria on 06/26/2022 08:05:32

## 2022-06-27 ENCOUNTER — Ambulatory Visit: Payer: Medicaid Other | Admitting: Physician Assistant

## 2022-07-03 ENCOUNTER — Encounter: Payer: Medicaid Other | Attending: Physician Assistant | Admitting: Physician Assistant

## 2022-07-03 DIAGNOSIS — Z89511 Acquired absence of right leg below knee: Secondary | ICD-10-CM | POA: Insufficient documentation

## 2022-07-03 DIAGNOSIS — E1143 Type 2 diabetes mellitus with diabetic autonomic (poly)neuropathy: Secondary | ICD-10-CM | POA: Insufficient documentation

## 2022-07-03 DIAGNOSIS — E11622 Type 2 diabetes mellitus with other skin ulcer: Secondary | ICD-10-CM | POA: Diagnosis present

## 2022-07-03 DIAGNOSIS — E1151 Type 2 diabetes mellitus with diabetic peripheral angiopathy without gangrene: Secondary | ICD-10-CM | POA: Insufficient documentation

## 2022-07-03 DIAGNOSIS — L97812 Non-pressure chronic ulcer of other part of right lower leg with fat layer exposed: Secondary | ICD-10-CM | POA: Diagnosis not present

## 2022-07-03 NOTE — Progress Notes (Signed)
MECHILLE, VARGHESE ([id number]) Visit Report for 07/03/2022 Chief Complaint Document Details Patient Name: BEBE, MONCURE Date of Service: 07/03/2022 8:00 AM Medical Record Number: [id number] Patient Account Number: [id number] Date of Birth/Sex: 1985-10-07 (36 y.o. F) Treating RN: Carlene Coria Primary Care Provider: Jac Canavan Other Clinician: Referring Provider: Jac Canavan Treating Provider/Extender: Skipper Cliche in Treatment: 22 Information Obtained from: Patient Chief Complaint Right BKA Amputation Site Disruption Electronic Signature(s) Signed: 07/03/2022 8:25:08 AM By: Worthy Keeler PA-C Entered By: Worthy Keeler on 07/03/2022 08:25:08 [name] ([id number]) -------------------------------------------------------------------------------- Problem List Details Patient Name: [name] Date of Service: 07/03/2022 8:00 AM Medical Record Number: [id number] Patient Account Number: [id number] Date of Birth/Sex: Feb 03, 1986 (35 y.o. F) Treating RN: Carlene Coria Primary Care Provider: Jac Canavan Other Clinician: Referring Provider: Jac Canavan Treating Provider/Extender: Skipper Cliche in Treatment: 22 Active Problems ICD-10 Encounter Code Description Active Date MDM Diagnosis E11.622 Type 2 diabetes mellitus with other skin ulcer 01/27/2022 No Yes T81.31XA Disruption of external operation (surgical) wound, not elsewhere 01/27/2022 No Yes classified, initial encounter L97.812 Non-pressure chronic ulcer of other part of right lower leg with fat layer 01/27/2022 No Yes exposed E11.43 Type 2 diabetes mellitus with diabetic autonomic (poly)neuropathy 01/27/2022 No Yes I10 Essential (primary) hypertension 01/27/2022 No Yes Inactive Problems Resolved Problems Electronic Signature(s) Signed: 07/03/2022 8:25:05 AM By: Worthy Keeler PA-C Entered By: Worthy Keeler on 07/03/2022 08:25:05

## 2022-07-05 NOTE — Progress Notes (Signed)
[name] ([id number]) Visit Report for 07/03/2022 Arrival Information Details Patient Name: [name], [name] Date of Service: 07/03/2022 8:00 AM Medical Record Number: [id number] Patient Account Number: [id number] Date of Birth/Sex: 03/09/1986 (36 y.o. F) Treating RN: Carlene Coria Primary Care Najiyah Paris: Jac Canavan Other Clinician: Referring Sylis Ketchum: Jac Canavan Treating Lourine Alberico/Extender: Skipper Cliche in Treatment: 106 Visit Information History Since Last Visit All ordered tests and consults were completed: No Patient Arrived: Wheel Chair Added or deleted any medications: No Arrival Time: 08:01 Any new allergies or adverse reactions: No Accompanied By: self Had a fall or experienced change in No Transfer Assistance: None activities of daily living that may affect Patient Identification Verified: Yes risk of falls: Secondary Verification Process Completed: Yes Signs or symptoms of abuse/neglect since last visito No Patient Requires Transmission-Based Precautions: No Hospitalized since last visit: No Patient Has Alerts: Yes Implantable device outside of the clinic excluding No cellular tissue based products placed in the center since last visit: Has Dressing in Place as Prescribed: Yes Has Compression in Place as Prescribed: Yes Pain Present Now: No Electronic Signature(s) Signed: 07/05/2022 11:46:40 AM By: Carlene Coria RN Entered By: Carlene Coria on 07/03/2022 08:04:37 [name] ([id number]) -------------------------------------------------------------------------------- Clinic Level of Care Assessment Details Patient Name: [name] Date of Service: 07/03/2022 8:00 AM Medical Record Number: [id number] Patient Account Number: [id number] Date of Birth/Sex: 06/22/1986 (35 y.o. F) Treating RN: Carlene Coria Primary Care Rolando Hessling: Jac Canavan Other Clinician: Referring Peityn Payton: Jac Canavan Treating Bernd Crom/Extender: Skipper Cliche in Treatment:  22 Clinic Level of Care Assessment Items TOOL 4 Quantity Score X - Use when only an EandM is performed on FOLLOW-UP visit 1 0 ASSESSMENTS - Nursing Assessment / Reassessment X - Reassessment of Co-morbidities (includes updates in patient status) 1 10 X- 1 5 Reassessment of Adherence to Treatment Plan ASSESSMENTS - Wound and Skin Assessment / Reassessment X - Simple Wound Assessment / Reassessment - one wound 1 5 _0  - 0 Complex Wound Assessment / Reassessment - multiple wounds _1  - 0 Dermatologic / Skin Assessment (not related to wound area) ASSESSMENTS - Focused Assessment _2  - Circumferential Edema Measurements - multi extremities 0 _3  - 0 Nutritional Assessment / Counseling / Intervention _4  - 0 Lower Extremity Assessment (monofilament, tuning fork, pulses) _5  - 0 Peripheral Arterial Disease Assessment (using hand held doppler) ASSESSMENTS - Ostomy and/or Continence Assessment and Care _6  - Incontinence Assessment and Management 0 _7  - 0 Ostomy Care Assessment and Management (repouching, etc.) PROCESS - Coordination of Care X - Simple Patient / Family Education for ongoing care 1 15 _8  - 0 Complex (extensive) Patient / Family Education for ongoing care _9  - 0 Staff obtains Programmer, systems, Records, Test Results / Process Orders _10  - 0 Staff telephones HHA, Nursing Homes / Clarify orders / etc _11  - 0 Routine Transfer to another Facility (non-emergent condition) _12  - 0 Routine Hospital Admission (non-emergent condition) _13  - 0 New Admissions / Biomedical engineer / Ordering NPWT, Apligraf, etc. _14  - 0 Emergency Hospital Admission (emergent condition) X- 1 10 Simple Discharge Coordination _15  - 0 Complex (extensive) Discharge Coordination PROCESS - Special Needs _16  - Pediatric / Minor Patient Management 0 _17  - 0 Isolation Patient Management _18  - 0 Hearing / Language / Visual special needs _19  - 0 Assessment of Community assistance (transportation, D/C planning,  etc.) _20  - 0 Additional assistance / Altered mentation _21  - 0 Support Surface(s) Assessment (bed, cushion, seat, etc.) INTERVENTIONS - Wound Cleansing / Measurement [name] ([id number]) X- 1 5 Simple  Wound Cleansing - one wound _0  - 0 Complex Wound Cleansing - multiple wounds X- 1 5 Wound Imaging (photographs - any number of wounds) _1  - 0 Wound Tracing (instead of photographs) X- 1 5 Simple Wound Measurement - one wound _2  - 0 Complex Wound Measurement - multiple wounds INTERVENTIONS - Wound Dressings X - Small Wound Dressing one or multiple wounds 1 10 _3  - 0 Medium Wound Dressing one or multiple wounds _4  - 0 Large Wound Dressing one or multiple wounds <IRCVELFYBOFBPZWC>_5<\/ENIDPOEUMPNTIRWE>_3  - 0 Application of Medications - topical <XVQMGQQPYPPJKDTO>_6<\/ZTIWPYKDXIPJASNK>_5  - 0 Application of Medications - injection INTERVENTIONS - Miscellaneous _7  - External ear exam 0 _8  - 0 Specimen Collection (cultures, biopsies, blood, body fluids, etc.) _9  - 0 Specimen(s) / Culture(s) sent or taken to Lab for analysis _10  - 0 Patient Transfer (multiple staff / Civil Service fast streamer / Similar devices) _11  - 0 Simple Staple / Suture removal (25 or less) _12  - 0 Complex Staple / Suture removal (26 or more) _13  - 0 Hypo / Hyperglycemic Management (close monitor of Blood Glucose) _14  - 0 Ankle / Brachial Index (ABI) - do not check if billed separately X- 1 5 Vital Signs Has the patient been seen at the hospital within the last three years: Yes Total Score: 75 Level Of Care: New/Established - Level 2 Electronic Signature(s) Signed: 07/05/2022 11:46:40 AM By: Carlene Coria RN Entered By: Carlene Coria on 07/03/2022 08:27:34 [name] ([id number]) -------------------------------------------------------------------------------- Encounter Discharge Information Details Patient Name: [name] Date of Service: 07/03/2022 8:00 AM Medical Record Number: [id number] Patient Account Number: [id number] Date of Birth/Sex: 21-Sep-1986 (36 y.o. F) Treating RN: Carlene Coria Primary Care Lili Harts: Jac Canavan Other Clinician: Referring Sharol Croghan: Jac Canavan Treating Alayssa Flinchum/Extender: Skipper Cliche in Treatment: 22 Encounter Discharge Information Items Discharge Condition: Stable Ambulatory Status: Wheelchair Discharge Destination: Home Transportation: Private Auto Accompanied By: self Schedule Follow-up Appointment: Yes Clinical Summary of Care: Electronic Signature(s) Signed: 07/05/2022 11:46:40 AM By: Carlene Coria RN Entered By: Carlene Coria on 07/03/2022 08:28:53 [name] ([id number]) -------------------------------------------------------------------------------- Lower Extremity Assessment Details Patient Name: [name] Date of Service: 07/03/2022 8:00 AM Medical Record Number: [id number] Patient Account Number: [id number] Date of Birth/Sex: [birth year] (35 y.o. F) Treating RN: Carlene Coria Primary Care Marqueta Pulley: Jac Canavan Other Clinician: Referring Shanessa Hodak: Jac Canavan Treating Ayat Drenning/Extender: Skipper Cliche in Treatment: 22 Electronic Signature(s) Signed: 07/05/2022 11:46:40 AM By: Carlene Coria RN Entered By: Carlene Coria on 07/03/2022 08:10:22 [name] ([id number]) -------------------------------------------------------------------------------- Multi Wound Chart Details Patient Name: [name] Date of Service: 07/03/2022 8:00 AM Medical Record Number: [id number] Patient Account Number: [id number] Date of Birth/Sex: 11/25/1985 (35 y.o. F) Treating RN: Carlene Coria Primary Care Ashrith Sagan: Jac Canavan Other Clinician: Referring Revere Maahs: Jac Canavan Treating Midge Momon/Extender: Skipper Cliche in Treatment: 22 Vital Signs Height(in): 67 Pulse(bpm): 111 Weight(lbs): 219 Blood Pressure(mmHg): 128/82 Body Mass Index(BMI): 34.3 Temperature(F): 98 Respiratory Rate(breaths/min): 18 Photos: [N/A:N/A] Wound Location: Right Amputation Site - Below Knee N/A N/A Wounding Event: Gradually  Appeared N/A N/A Primary Etiology: Abscess N/A N/A Comorbid History: Hypertension, Type II Diabetes N/A N/A Date Acquired: 01/14/2022 N/A N/A Weeks of Treatment: 22 N/A N/A Wound Status: Open N/A N/A Wound Recurrence: No N/A N/A Measurements L x W x D (cm) 0.3x0.8x0.1 N/A N/A Area (cm) : 0.188 N/A N/A Volume (cm) : 0.019 N/A N/A % Reduction in Area: 99.30% N/A N/A % Reduction in Volume: 100.00% N/A N/A Classification: Full Thickness With Exposed N/A N/A Support Structures Exudate Amount: Medium N/A N/A Exudate Type: Serosanguineous N/A  N/A Exudate Color: red, brown N/A N/A Wound Margin: Flat and Intact N/A N/A Granulation Amount: Large (67-100%) N/A N/A Granulation Quality: Red N/A N/A Necrotic Amount: None Present (0%) N/A N/A Exposed Structures: Fat Layer (Subcutaneous Tissue): N/A N/A Yes Fascia: No Tendon: No Muscle: No Joint: No Bone: No Epithelialization: Small (1-33%) N/A N/A Treatment Notes Electronic Signature(s) Signed: 07/05/2022 11:46:40 AM By: Carlene Coria RN Entered By: Carlene Coria on 07/03/2022 08:11:16 [name] ([id number]) -------------------------------------------------------------------------------- Multi-Disciplinary Care Plan Details Patient Name: [name] Date of Service: 07/03/2022 8:00 AM Medical Record Number: [id number] Patient Account Number: [id number] Date of Birth/Sex: March 27, 1986 (35 y.o. F) Treating RN: Carlene Coria Primary Care Brittnae Aschenbrenner: Jac Canavan Other Clinician: Referring Miquela Costabile: Jac Canavan Treating Lonni Dirden/Extender: Skipper Cliche in Treatment: 22 Active Inactive Wound/Skin Impairment Nursing Diagnoses: Knowledge deficit related to ulceration/compromised skin integrity Goals: Patient/caregiver will verbalize understanding of skin care regimen Date Initiated: 01/27/2022 Target Resolution Date: 05/25/2022 Goal Status: Active Ulcer/skin breakdown will have a volume reduction of 30% by week 4 Date  Initiated: 01/27/2022 Date Inactivated: 04/24/2022 Target Resolution Date: 03/29/2022 Goal Status: Met Ulcer/skin breakdown will have a volume reduction of 50% by week 8 Date Initiated: 01/27/2022 Date Inactivated: 04/24/2022 Target Resolution Date: 04/28/2022 Goal Status: Met Ulcer/skin breakdown will have a volume reduction of 80% by week 12 Date Initiated: 01/27/2022 Date Inactivated: 06/26/2022 Target Resolution Date: 05/29/2022 Goal Status: Met Ulcer/skin breakdown will heal within 14 weeks Date Initiated: 01/27/2022 Date Inactivated: 07/03/2022 Target Resolution Date: 06/29/2022 Goal Status: Unmet Unmet Reason: comordbities Interventions: Assess patient/caregiver ability to obtain necessary supplies Assess patient/caregiver ability to perform ulcer/skin care regimen upon admission and as needed Assess ulceration(s) every visit Notes: Electronic Signature(s) Signed: 07/05/2022 11:46:40 AM By: Carlene Coria RN Entered By: Carlene Coria on 07/03/2022 08:11:00 [name] ([id number]) -------------------------------------------------------------------------------- Pain Assessment Details Patient Name: [name] Date of Service: 07/03/2022 8:00 AM Medical Record Number: [id number] Patient Account Number: [id number] Date of Birth/Sex: 02/06/1986 (36 y.o. F) Treating RN: Carlene Coria Primary Care Everhett Bozard: Jac Canavan Other Clinician: Referring Chole Driver: Jac Canavan Treating Alanee Ting/Extender: Skipper Cliche in Treatment: 22 Active Problems Location of Pain Severity and Description of Pain Patient Has Paino No Site Locations Pain Management and Medication Current Pain Management: Electronic Signature(s) Signed: 07/05/2022 11:46:40 AM By: Carlene Coria RN Entered By: Carlene Coria on 07/03/2022 08:05:05 [name] ([id number]) -------------------------------------------------------------------------------- Patient/Caregiver Education Details Patient Name: [name] Date of Service: 07/03/2022 8:00 AM Medical Record Number: [id number] Patient Account Number: [id number] Date of Birth/Gender: 1986-02-15 (35 y.o. F) Treating RN: Carlene Coria Primary Care Physician: Jac Canavan Other Clinician: Referring Physician: Jac Canavan Treating Physician/Extender: Skipper Cliche in Treatment: 22 Education Assessment Education Provided To: Patient Education Topics Provided Wound/Skin Impairment: Methods: Explain/Verbal Responses: State content correctly Electronic Signature(s) Signed: 07/05/2022 11:46:40 AM By: Carlene Coria RN Entered By: Carlene Coria on 07/03/2022 08:27:55 [name] ([id number]) -------------------------------------------------------------------------------- Wound Assessment Details Patient Name: [name] Date of Service: 07/03/2022 8:00 AM Medical Record Number: [id number] Patient Account Number: [id number] Date of Birth/Sex: 28-Jan-1986 (35 y.o. F) Treating RN: Carlene Coria Primary Care Kensli Bowley: Jac Canavan Other Clinician: Referring Seairra Otani: Jac Canavan Treating Blimie Vaness/Extender: Skipper Cliche in Treatment: 22 Wound Status Wound Number: 1 Primary Etiology: Abscess Wound Location: Right Amputation Site - Below Knee Wound Status: Open Wounding Event: Gradually Appeared Comorbid History: Hypertension, Type II Diabetes Date Acquired: 01/14/2022 Weeks Of Treatment: 22 Clustered Wound: No Photos Wound Measurements Length: (cm) 0.3 Width: (cm) 0.8 Depth: (cm) 0.1 Area: (cm) 0.188 Volume: (  cm) 0.019 % Reduction in Area: 99.3% % Reduction in Volume: 100% Epithelialization: Small (1-33%) Tunneling: No Undermining: No Wound Description Classification: Full Thickness With Exposed Support Structures Wound Margin: Flat and Intact Exudate Amount: Medium Exudate Type: Serosanguineous Exudate Color: red, brown Foul Odor After Cleansing: No Slough/Fibrino No Wound Bed Granulation Amount: Large  (67-100%) Exposed Structure Granulation Quality: Red Fascia Exposed: No Necrotic Amount: None Present (0%) Fat Layer (Subcutaneous Tissue) Exposed: Yes Tendon Exposed: No Muscle Exposed: No Joint Exposed: No Bone Exposed: No Treatment Notes Wound #1 (Amputation Site - Below Knee) Wound Laterality: Right Cleanser Peri-Wound Care AandD Ointment Discharge Instruction: wound edges Senteno, Anna ([id number]) Topical Primary Dressing Prisma 4.34 (in) Discharge Instruction: Moisten w/normal saline or sterile water; Cover wound as directed. Do not remove from wound bed. Secondary Dressing Gauze Secured With Medipore Tape - 33M Medipore H Soft Cloth Surgical Tape, 2x2 (in/yd) Compression Wrap Compression Stockings Add-Ons Electronic Signature(s) Signed: 07/05/2022 11:46:40 AM By: Carlene Coria RN Entered By: Carlene Coria on 07/03/2022 08:10:14 [name] ([id number]) -------------------------------------------------------------------------------- Vitals Details Patient Name: [name] Date of Service: 07/03/2022 8:00 AM Medical Record Number: [id number] Patient Account Number: [id number] Date of Birth/Sex: 1985/12/09 (35 y.o. F) Treating RN: Carlene Coria Primary Care Gordie Crumby: Jac Canavan Other Clinician: Referring Stacy Deshler: Jac Canavan Treating Adell Panek/Extender: Skipper Cliche in Treatment: 22 Vital Signs Time Taken: 08:04 Temperature (F): 98 Height (in): 67 Pulse (bpm): 111 Weight (lbs): 219 Respiratory Rate (breaths/min): 18 Body Mass Index (BMI): 34.3 Blood Pressure (mmHg): 128/82 Reference Range: 80 - 120 mg / dl Electronic Signature(s) Signed: 07/05/2022 11:46:40 AM By: Carlene Coria RN Entered By: Carlene Coria on 07/03/2022 08:04:54

## 2022-07-10 ENCOUNTER — Encounter: Payer: Medicaid Other | Admitting: Physician Assistant

## 2022-07-10 DIAGNOSIS — E11622 Type 2 diabetes mellitus with other skin ulcer: Secondary | ICD-10-CM | POA: Diagnosis not present

## 2022-07-10 NOTE — Progress Notes (Addendum)
[name] ([id number]) Visit Report for 07/10/2022 Chief Complaint Document Details Patient Name: [name], [name] Date of Service: 07/10/2022 8:00 AM Medical Record Number: [id number] Patient Account Number: [id number] Date of Birth/Sex: November 24, 1985 (36 y.o. F) Treating RN: Yevonne Pax Primary Care Provider: Lynnell Chad Other Clinician: Referring Provider: Lynnell Chad Treating Provider/Extender: Rowan Blase in Treatment: 23 Information Obtained from: Patient Chief Complaint Right BKA Amputation Site Disruption Electronic Signature(s) Signed: 07/10/2022 8:19:00 AM By: Lenda Kelp PA-C Entered By: Lenda Kelp on 07/10/2022 08:18:59 [name] ([id number]) -------------------------------------------------------------------------------- HPI Details Patient Name: [name] Date of Service: 07/10/2022 8:00 AM Medical Record Number: [id number] Patient Account Number: [id number] Date of Birth/Sex: 1986/08/09 (35 y.o. F) Treating RN: Yevonne Pax Primary Care Provider: Lynnell Chad Other Clinician: Referring Provider: Lynnell Chad Treating Provider/Extender: Rowan Blase in Treatment: 23 History of Present Illness HPI Description: 01-27-2022 upon evaluation today patient appears to be doing somewhat poorly in regard to her right below-knee amputation. This unfortunately developed a abscess internally following the initial amputation which was January 2023. Subsequently she was getting very close to starting to work on the prosthesis when she had an area that somewhat bulged out ended up being an abscess and the incision and drainage was performed April 22. Subsequently she did have antibiotic beads placed in a wound VAC after this was cleared out. Her most recent reported hemoglobin A1c was 6.4. With that being said the patient tells me that her blood sugars seem to be under pretty good control as best she can tell at this point. She also tells me that the  wound VAC seems to be functioning appropriately although upon evaluation the did not appear to be anything tucked into the region of undermining and depth in the roughly 9-12 o'clock location based on what we saw today. This is also where the antibiotic beads are and I can see those in place. I do believe that she is getting need to have the wound VAC tucked down into this location. Patient does have a history of diabetes mellitus type 2, diabetic neuropathy, and hypertension. This initial amputation occurred as a result of her having sewing needles that actually went into her heel she did not know about it started having trouble with her heel and ended up with a significant infection which was stated to be necrotizing and she subsequently had to have emergency amputation. 02-13-2022 upon evaluation today patient appears to be doing well with regard to her wound. She has been tolerating the dressing changes without complication. The wound VAC does seem to be doing a great job. Fortunately I do not see any evidence of active infection locally or systemically at this time which is great news. She still has some of the antibiotic beads in the base of the wound. 02-20-2022 upon evaluation today patient appears to be doing well currently. There does not appear to be any evidence of active infection locally nor systemically which is great news and overall I am extremely pleased with where we stand today. I do believe that the wound VAC is doing a good job here and the color of the drainage is improved again were unsure as to whether this may have been the antibiotic beads or if there was something such as Pseudomonas causing the greenish discoloration to the drainage either way she is doing significantly better. 03-06-2022 upon evaluation today patient appears to be doing well with regard to her wound. The only issue I see is that she does have  a little bit of a pocket of fluid that collected in the deeper  section everything else is actually measuring smaller and looks better. This was a point where I really did expect this was been a turnaround and start doing a lot better. I think we are especially seeing that right now. 03-20-2022 upon evaluation today patient appears to be doing well with regard to her wound in fact this is actually significantly improved compared to last time I saw her. Fortunately I do not see any evidence of active infection at this time. No fevers, chills, nausea, vomiting, or diarrhea. 03-27-2022 upon evaluation today patient appears to be doing excellent in regard to her wound. This is showing signs of improvement which is great news and overall I am extremely pleased with where we stand currently. I do not see any evidence of active infection locally or systemically which is great news. No fevers, chills, nausea, vomiting, or diarrhea. 04-03-2022 upon evaluation today patient's wound is showing signs of significant improvement. I am still very pleased with where we stand and how things are going. I think that she is really making excellent progress which is great news. I do not see any signs of active infection at this time. 04-10-2022 upon evaluation today patient's wound actually showed signs of good granulation and epithelization at this point. Fortunately I do not see any evidence of active infection at this time which is great news and overall I am extremely pleased with where things stand currently. No fevers, chills, nausea, vomiting, or diarrhea. 04-17-2022 upon evaluation today patient appears to be doing well with regard to her wound she is actually showing signs of significant improvement which is great news. I do believe that she is making good progress with the current wound care measures. Fortunately I do not see any evidence of active infection locally or systemically which is great news. 7/24; right BKA site secondary to an abscess. Per our intake nurse wound VAC  came off 2 weeks ago she has been using silver collagen. Nice improvements in wound dimensions. She does not have a prosthesis as of yet 05-01-2022 upon evaluation today patient appears to be doing well with regard to her wound in fact this is measuring significantly smaller and is really doing quite well. Fortunately I do not see any evidence of active infection locally or systemically at this time. 05-08-2022 upon evaluation today patient's wound is actually showing signs of excellent improvement. I am actually very pleased with where we stand I think that she is making great progress and I do not see any signs of active infection at this time which is great news. No fevers, chills, nausea, vomiting, or diarrhea. 05-15-2022 upon evaluation today patient appears to be doing well currently in regard to her wound. This is actually showing signs of excellent improvement is noted completely to the surface there is just a little bit of biofilm although we are can have to clear this away with sharp debridement as she is saline and gauze treatment is not enough to get this loosened today. 05-22-2022 upon evaluation today patient appears to be doing excellent in regard to her wound this is showing signs of excellent improvement the surgeon she saw just last week he was extremely pleased with how things were doing and in fact was shocked with the progress. 05-29-2022 upon evaluation today patient appears to be doing well currently in regard to her wound the wound does appear to have little bit of [place], [name] ([id number])  slough and biofilm on the surface of the wound that would not clearway today but the good news is she seems to be doing excellent otherwise. There does not appear to be any signs of active infection locally or systemically at this time. 06-06-2022 upon evaluation today patient appears to be doing well with regard to her wound the amputation site right leg. She has been tolerating the dressing  changes without complication. Fortunately there does not appear to be any evidence of active infection locally or systemically at this time. 06-12-2022 upon evaluation today patient appears to be doing well currently in regard to her wound. She is actually showing signs of improvement there is some slough and biofilm noted on the surface of the wound. Fortunately there does not appear to be any signs of active infection locally or systemically at this time which is great news. No fevers, chills, nausea, vomiting, or diarrhea. 06-19-2022 upon evaluation today patient appears to be doing well currently in regard to her wound this is actually measuring significantly smaller again and looks to be doing excellent. I see no signs of active infection at this time which is great news. 06-26-2022 upon evaluation today patient appears to be doing excellent in regard to her wound. She has been tolerating the dressing changes without complication. Fortunately there does not appear to be any evidence of active infection at this time and I think she is headed in the right direction. 07-03-2022 upon evaluation today patient's wound is actually showing signs of excellent improvement. Fortunately I do not see any evidence of infection locally or systemically which is great news and overall I do believe that we are on the right track. She is getting much smaller and I think was 7 mm smaller today compared to last week's visit which is awesome. 07-10-2022 upon evaluation today patient appears to be doing well currently in regard to her amputation site ulcer which in fact appears to be completely healed based on what I am seeing currently. Fortunately I do not see any evidence of active infection locally or systemically at this time which is great news. No fevers, chills, nausea, vomiting, or diarrhea. Electronic Signature(s) Signed: 07/10/2022 8:48:34 AM By: Lenda Kelp PA-C Entered By: Lenda Kelp on 07/10/2022  08:48:34 [name] ([id number]) -------------------------------------------------------------------------------- Physical Exam Details Patient Name: [name] Date of Service: 07/10/2022 8:00 AM Medical Record Number: [id number] Patient Account Number: [id number] Date of Birth/Sex: October 16, 1985 (35 y.o. F) Treating RN: Yevonne Pax Primary Care Provider: Lynnell Chad Other Clinician: Referring Provider: Lynnell Chad Treating Provider/Extender: Rowan Blase in Treatment: 80 Constitutional Well-nourished and well-hydrated in no acute distress. Respiratory normal breathing without difficulty. Psychiatric this patient is able to make decisions and demonstrates good insight into disease process. Alert and Oriented x 3. pleasant and cooperative. Notes Upon inspection patient's wound bed actually showed signs of good epithelization and I do not see anything open at this point. I am extremely pleased that she is healed and doing as well as she has. Electronic Signature(s) Signed: 07/10/2022 8:48:50 AM By: Lenda Kelp PA-C Entered By: Lenda Kelp on 07/10/2022 08:48:50 [name] ([id number]) -------------------------------------------------------------------------------- Physician Orders Details Patient Name: [name] Date of Service: 07/10/2022 8:00 AM Medical Record Number: [id number] Patient Account Number: [id number] Date of Birth/Sex: 10-06-1985 (35 y.o. F) Treating RN: Yevonne Pax Primary Care Provider: Lynnell Chad Other Clinician: Referring Provider: Lynnell Chad Treating Provider/Extender: Rowan Blase in Treatment: 32 Verbal / Phone Orders: No Diagnosis Coding ICD-10  Coding Code Description E11.622 Type 2 diabetes mellitus with other skin ulcer T81.31XA Disruption of external operation (surgical) wound, not elsewhere classified, initial encounter L97.812 Non-pressure chronic ulcer of other part of right lower leg with fat layer  exposed E11.43 Type 2 diabetes mellitus with diabetic autonomic (poly)neuropathy I10 Essential (primary) hypertension Discharge From [facility] o Discharge from Wound Care Center Treatment Complete - protect times 2 weeks , apply aandD ointment daily Wound Treatment Electronic Signature(s) Signed: 07/11/2022 6:10:21 PM By: Lenda Kelp PA-C Signed: 07/14/2022 12:25:01 PM By: Yevonne Pax RN Entered By: Yevonne Pax on 07/10/2022 08:27:43 [name] ([id number]) -------------------------------------------------------------------------------- Problem List Details Patient Name: [name] Date of Service: 07/10/2022 8:00 AM Medical Record Number: [id number] Patient Account Number: [id number] Date of Birth/Sex: 10-04-1985 (35 y.o. F) Treating RN: Yevonne Pax Primary Care Provider: Lynnell Chad Other Clinician: Referring Provider: Lynnell Chad Treating Provider/Extender: Rowan Blase in Treatment: 23 Active Problems ICD-10 Encounter Code Description Active Date MDM Diagnosis E11.622 Type 2 diabetes mellitus with other skin ulcer 01/27/2022 No Yes T81.31XA Disruption of external operation (surgical) wound, not elsewhere 01/27/2022 No Yes classified, initial encounter L97.812 Non-pressure chronic ulcer of other part of right lower leg with fat layer 01/27/2022 No Yes exposed E11.43 Type 2 diabetes mellitus with diabetic autonomic (poly)neuropathy 01/27/2022 No Yes I10 Essential (primary) hypertension 01/27/2022 No Yes Inactive Problems Resolved Problems Electronic Signature(s) Signed: 07/10/2022 8:18:55 AM By: Lenda Kelp PA-C Entered By: Lenda Kelp on 07/10/2022 08:18:55 [name] ([id number]) -------------------------------------------------------------------------------- Progress Note Details Patient Name: [name] Date of Service: 07/10/2022 8:00 AM Medical Record Number: [id number] Patient Account Number: [id number] Date of Birth/Sex:  1986-03-15 (35 y.o. F) Treating RN: Yevonne Pax Primary Care Provider: Lynnell Chad Other Clinician: Referring Provider: Lynnell Chad Treating Provider/Extender: Rowan Blase in Treatment: 23 Subjective Chief Complaint Information obtained from Patient Right BKA Amputation Site Disruption History of Present Illness (HPI) 01-27-2022 upon evaluation today patient appears to be doing somewhat poorly in regard to her right below-knee amputation. This unfortunately developed a abscess internally following the initial amputation which was January 2023. Subsequently she was getting very close to starting to work on the prosthesis when she had an area that somewhat bulged out ended up being an abscess and the incision and drainage was performed April 22. Subsequently she did have antibiotic beads placed in a wound VAC after this was cleared out. Her most recent reported hemoglobin A1c was 6.4. With that being said the patient tells me that her blood sugars seem to be under pretty good control as best she can tell at this point. She also tells me that the wound VAC seems to be functioning appropriately although upon evaluation the did not appear to be anything tucked into the region of undermining and depth in the roughly 9-12 o'clock location based on what we saw today. This is also where the antibiotic beads are and I can see those in place. I do believe that she is getting need to have the wound VAC tucked down into this location. Patient does have a history of diabetes mellitus type 2, diabetic neuropathy, and hypertension. This initial amputation occurred as a result of her having sewing needles that actually went into her heel she did not know about it started having trouble with her heel and ended up with a significant infection which was stated to be necrotizing and she subsequently had to have emergency amputation. 02-13-2022 upon evaluation today patient appears to be doing well with  regard to her wound. She has been tolerating the dressing changes without complication. The wound VAC does seem to be doing a great job. Fortunately I do not see any evidence of active infection locally or systemically at this time which is great news. She still has some of the antibiotic beads in the base of the wound. 02-20-2022 upon evaluation today patient appears to be doing well currently. There does not appear to be any evidence of active infection locally nor systemically which is great news and overall I am extremely pleased with where we stand today. I do believe that the wound VAC is doing a good job here and the color of the drainage is improved again were unsure as to whether this may have been the antibiotic beads or if there was something such as Pseudomonas causing the greenish discoloration to the drainage either way she is doing significantly better. 03-06-2022 upon evaluation today patient appears to be doing well with regard to her wound. The only issue I see is that she does have a little bit of a pocket of fluid that collected in the deeper section everything else is actually measuring smaller and looks better. This was a point where I really did expect this was been a turnaround and start doing a lot better. I think we are especially seeing that right now. 03-20-2022 upon evaluation today patient appears to be doing well with regard to her wound in fact this is actually significantly improved compared to last time I saw her. Fortunately I do not see any evidence of active infection at this time. No fevers, chills, nausea, vomiting, or diarrhea. 03-27-2022 upon evaluation today patient appears to be doing excellent in regard to her wound. This is showing signs of improvement which is great news and overall I am extremely pleased with where we stand currently. I do not see any evidence of active infection locally or systemically which is great news. No fevers, chills, nausea, vomiting,  or diarrhea. 04-03-2022 upon evaluation today patient's wound is showing signs of significant improvement. I am still very pleased with where we stand and how things are going. I think that she is really making excellent progress which is great news. I do not see any signs of active infection at this time. 04-10-2022 upon evaluation today patient's wound actually showed signs of good granulation and epithelization at this point. Fortunately I do not see any evidence of active infection at this time which is great news and overall I am extremely pleased with where things stand currently. No fevers, chills, nausea, vomiting, or diarrhea. 04-17-2022 upon evaluation today patient appears to be doing well with regard to her wound she is actually showing signs of significant improvement which is great news. I do believe that she is making good progress with the current wound care measures. Fortunately I do not see any evidence of active infection locally or systemically which is great news. 7/24; right BKA site secondary to an abscess. Per our intake nurse wound VAC came off 2 weeks ago she has been using silver collagen. Nice improvements in wound dimensions. She does not have a prosthesis as of yet 05-01-2022 upon evaluation today patient appears to be doing well with regard to her wound in fact this is measuring significantly smaller and is really doing quite well. Fortunately I do not see any evidence of active infection locally or systemically at this time. 05-08-2022 upon evaluation today patient's wound is actually showing signs of excellent improvement. I am  actually very pleased with where we stand I think that she is making great progress and I do not see any signs of active infection at this time which is great news. No fevers, chills, nausea, vomiting, or diarrhea. 05-15-2022 upon evaluation today patient appears to be doing well currently in regard to her wound. This is actually showing signs of  excellent improvement is noted completely to the surface there is just a little bit of biofilm although we are can have to clear this away with sharp debridement as she is saline and gauze treatment is not enough to get this loosened today. [name], [name] ([id number]) 05-22-2022 upon evaluation today patient appears to be doing excellent in regard to her wound this is showing signs of excellent improvement the surgeon she saw just last week he was extremely pleased with how things were doing and in fact was shocked with the progress. 05-29-2022 upon evaluation today patient appears to be doing well currently in regard to her wound the wound does appear to have little bit of slough and biofilm on the surface of the wound that would not clearway today but the good news is she seems to be doing excellent otherwise. There does not appear to be any signs of active infection locally or systemically at this time. 06-06-2022 upon evaluation today patient appears to be doing well with regard to her wound the amputation site right leg. She has been tolerating the dressing changes without complication. Fortunately there does not appear to be any evidence of active infection locally or systemically at this time. 06-12-2022 upon evaluation today patient appears to be doing well currently in regard to her wound. She is actually showing signs of improvement there is some slough and biofilm noted on the surface of the wound. Fortunately there does not appear to be any signs of active infection locally or systemically at this time which is great news. No fevers, chills, nausea, vomiting, or diarrhea. 06-19-2022 upon evaluation today patient appears to be doing well currently in regard to her wound this is actually measuring significantly smaller again and looks to be doing excellent. I see no signs of active infection at this time which is great news. 06-26-2022 upon evaluation today patient appears to be doing excellent in  regard to her wound. She has been tolerating the dressing changes without complication. Fortunately there does not appear to be any evidence of active infection at this time and I think she is headed in the right direction. 07-03-2022 upon evaluation today patient's wound is actually showing signs of excellent improvement. Fortunately I do not see any evidence of infection locally or systemically which is great news and overall I do believe that we are on the right track. She is getting much smaller and I think was 7 mm smaller today compared to last week's visit which is awesome. 07-10-2022 upon evaluation today patient appears to be doing well currently in regard to her amputation site ulcer which in fact appears to be completely healed based on what I am seeing currently. Fortunately I do not see any evidence of active infection locally or systemically at this time which is great news. No fevers, chills, nausea, vomiting, or diarrhea. Objective Constitutional Well-nourished and well-hydrated in no acute distress. Vitals Time Taken: 8:07 AM, Height: 67 in, Weight: 219 lbs, BMI: 34.3, Temperature: 98 F, Pulse: 116 bpm, Respiratory Rate: 18 breaths/min, Blood Pressure: 133/85 mmHg. Respiratory normal breathing without difficulty. Psychiatric this patient is able to make decisions and demonstrates  good insight into disease process. Alert and Oriented x 3. pleasant and cooperative. General Notes: Upon inspection patient's wound bed actually showed signs of good epithelization and I do not see anything open at this point. I am extremely pleased that she is healed and doing as well as she has. Integumentary (Hair, Skin) Wound #1 status is Healed - Epithelialized. Original cause of wound was Gradually Appeared. The date acquired was: 01/14/2022. The wound has been in treatment 23 weeks. The wound is located on the Right Amputation Site - Below Knee. The wound measures 0cm length x 0cm width x 0cm  depth; 0cm^2 area and 0cm^3 volume. There is no tunneling or undermining noted. There is a none present amount of drainage noted. The wound margin is flat and intact. There is no granulation within the wound bed. There is no necrotic tissue within the wound bed. Assessment Active Problems ICD-10 Type 2 diabetes mellitus with other skin ulcer Disruption of external operation (surgical) wound, not elsewhere classified, initial encounter Non-pressure chronic ulcer of other part of right lower leg with fat layer exposed Type 2 diabetes mellitus with diabetic autonomic (poly)neuropathy Essential (primary) hypertension [name], [name] ([id number]) [facility]: [facility] Treatment Complete - protect times 2 weeks , apply aandD ointment daily 1. 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 I am not sure if she is quite at that point from the standpoint of their logistics yet but at the same time I think from a wound healing standpoint they can do what needs to be done. 2. I am also going to recommend that we have the patient continue to cover this during the day although at nighttime I think she can take everything off and apply AandD ointment. 3. I would also suggest that she continue to monitor for any signs of worsening or infection. Obviously if anything changes she should let me know after 2 weeks wearing the protective dressing she can then go without any dressing issues in the shrinker. We will see patient back for reevaluation in 1 week here in the clinic. If anything worsens or changes patient will contact our office for additional recommendations. Electronic Signature(s) Signed: 07/10/2022 8:49:44 AM By: Lenda KelpStone III, Sayward Horvath PA-C Entered By: Lenda KelpStone III, Kaeleen Odom on 07/10/2022 08:49:43 [name], [name] ([id number]) -------------------------------------------------------------------------------- SuperBill  Details Patient Name: [name], [name] Date of Service: 07/10/2022 Medical Record Number: [id number] Patient Account Number: [id number] Date of Birth/Sex: 08/22/1986 (35 y.o. F) Treating RN: Yevonne PaxEpps, Carrie Primary Care Provider: Lynnell ChadIjaola, Onyeje Other Clinician: Referring Provider: Lynnell ChadIjaola, Onyeje Treating Provider/Extender: Rowan BlaseStone, Wilfred Siverson Weeks in Treatment: 23 Diagnosis Coding ICD-10 Codes Code Description E11.622 Type 2 diabetes mellitus with other skin ulcer T81.31XA Disruption of external operation (surgical) wound, not elsewhere classified, initial encounter L97.812 Non-pressure chronic ulcer of other part of right lower leg with fat layer exposed E11.43 Type 2 diabetes mellitus with diabetic autonomic (poly)neuropathy I10 Essential (primary) hypertension Facility Procedures CPT4 Code: [id number] Description: [phone] - WOUND CARE VISIT-LEV 2 EST PT Modifier: Quantity: 1 Physician Procedures CPT4 Code: [id number] Description: 99213 - WC PHYS LEVEL 3 - EST PT Modifier: Quantity: 1 CPT4 Code: Description: ICD-10 Diagnosis Description E11.622 Type 2 diabetes mellitus with other skin ulcer T81.31XA Disruption of external operation (surgical) wound, not elsewhere classifi L97.812 Non-pressure chronic ulcer of other part of right lower leg with  fat laye E11.43 Type 2 diabetes mellitus with diabetic autonomic (poly)neuropathy Modifier: ed, initial encounter r  exposed Quantity: Electronic Signature(s) Signed: 07/10/2022 8:49:57 AM By: Lenda Kelp PA-C Entered By: Lenda Kelp on 07/10/2022 08:49:57

## 2022-07-14 NOTE — Progress Notes (Signed)
CHEZNEY, HUETHER ([id number]) Visit Report for 07/10/2022 Arrival Information Details Patient Name: [name], [name] Date of Service: 07/10/2022 8:00 AM Medical Record Number: [id number] Patient Account Number: [id number] Date of Birth/Sex: 04-25-1986 (36 y.o. F) Treating RN: Yevonne Pax Primary Care Danahi Reddish: Lynnell Chad Other Clinician: Referring Manasi Dishon: Lynnell Chad Treating Ilithyia Titzer/Extender: Rowan Blase in Treatment: 23 Visit Information History Since Last Visit All ordered tests and consults were completed: No Patient Arrived: Wheel Chair Added or deleted any medications: No Arrival Time: 08:02 Any new allergies or adverse reactions: No Accompanied By: self Had a fall or experienced change in No Transfer Assistance: None activities of daily living that may affect Patient Identification Verified: Yes risk of falls: Secondary Verification Process Completed: Yes Signs or symptoms of abuse/neglect since last visito No Patient Requires Transmission-Based Precautions: No Hospitalized since last visit: No Patient Has Alerts: Yes Implantable device outside of the clinic excluding No cellular tissue based products placed in the center since last visit: Has Dressing in Place as Prescribed: Yes Pain Present Now: No Electronic Signature(s) Signed: 07/14/2022 12:25:01 PM By: Yevonne Pax RN Entered By: Yevonne Pax on 07/10/2022 08:07:11 [name] ([id number]) -------------------------------------------------------------------------------- Clinic Level of Care Assessment Details Patient Name: [name] Date of Service: 07/10/2022 8:00 AM Medical Record Number: [id number] Patient Account Number: [id number] Date of Birth/Sex: 1985/10/21 (35 y.o. F) Treating RN: Yevonne Pax Primary Care Dejuan Elman: Lynnell Chad Other Clinician: Referring Dyanna Seiter: Lynnell Chad Treating Tieara Flitton/Extender: Rowan Blase in Treatment: 23 Clinic Level of Care Assessment  Items TOOL 4 Quantity Score X - Use when only an EandM is performed on FOLLOW-UP visit 1 0 ASSESSMENTS - Nursing Assessment / Reassessment X - Reassessment of Co-morbidities (includes updates in patient status) 1 10 X- 1 5 Reassessment of Adherence to Treatment Plan ASSESSMENTS - Wound and Skin Assessment / Reassessment X - Simple Wound Assessment / Reassessment - one wound 1 5 []  - 0 Complex Wound Assessment / Reassessment - multiple wounds []  - 0 Dermatologic / Skin Assessment (not related to wound area) ASSESSMENTS - Focused Assessment []  - Circumferential Edema Measurements - multi extremities 0 []  - 0 Nutritional Assessment / Counseling / Intervention []  - 0 Lower Extremity Assessment (monofilament, tuning fork, pulses) []  - 0 Peripheral Arterial Disease Assessment (using hand held doppler) ASSESSMENTS - Ostomy and/or Continence Assessment and Care []  - Incontinence Assessment and Management 0 []  - 0 Ostomy Care Assessment and Management (repouching, etc.) PROCESS - Coordination of Care X - Simple Patient / Family Education for ongoing care 1 15 []  - 0 Complex (extensive) Patient / Family Education for ongoing care []  - 0 Staff obtains , Records, Test Results / Process Orders []  - 0 Staff telephones HHA, Nursing Homes / Clarify orders / etc []  - 0 Routine Transfer to another Facility (non-emergent condition) []  - 0 Routine Hospital Admission (non-emergent condition) []  - 0 New Admissions / / Ordering NPWT, Apligraf, etc. []  - 0 Emergency Hospital Admission (emergent condition) X- 1 10 Simple Discharge Coordination []  - 0 Complex (extensive) Discharge Coordination PROCESS - Special Needs []  - Pediatric / Minor Patient Management 0 []  - 0 Isolation Patient Management []  - 0 Hearing / Language / Visual special needs []  - 0 Assessment of Community assistance (transportation, D/C planning, etc.) []  - 0 Additional assistance /  Altered mentation []  - 0 Support Surface(s) Assessment (bed, cushion, seat, etc.) INTERVENTIONS - Wound Cleansing / Measurement [name], [name] ( ) X- 1 5 Simple Wound Cleansing - one wound []  -  0 Complex Wound Cleansing - multiple wounds X- 1 5 Wound Imaging (photographs - any number of wounds) []  - 0 Wound Tracing (instead of photographs) X- 1 5 Simple Wound Measurement - one wound []  - 0 Complex Wound Measurement - multiple wounds INTERVENTIONS - Wound Dressings X - Small Wound Dressing one or multiple wounds 1 10 []  - 0 Medium Wound Dressing one or multiple wounds []  - 0 Large Wound Dressing one or multiple wounds []  - 0 Application of Medications - topical []  - 0 Application of Medications - injection INTERVENTIONS - Miscellaneous []  - External ear exam 0 []  - 0 Specimen Collection (cultures, biopsies, blood, body fluids, etc.) []  - 0 Specimen(s) / Culture(s) sent or taken to Lab for analysis []  - 0 Patient Transfer (multiple staff / [employer] fast streamer / Similar devices) []  - 0 Simple Staple / Suture removal (25 or less) []  - 0 Complex Staple / Suture removal (26 or more) []  - 0 Hypo / Hyperglycemic Management (close monitor of Blood Glucose) []  - 0 Ankle / Brachial Index (ABI) - do not check if billed separately X- 1 5 Vital Signs Has the patient been seen at the hospital within the last three years: Yes Total Score: 75 Level Of Care: New/Established - Level 2 Electronic Signature(s) Signed: 07/14/2022 12:25:01 PM By: Carlene Coria RN Entered By: Carlene Coria on 07/10/2022 08:30:23 [name] ([id number]) -------------------------------------------------------------------------------- Encounter Discharge Information Details Patient Name: [name] Date of Service: 07/10/2022 8:00 AM Medical Record Number: [id number] Patient Account Number: [id number] Date of Birth/Sex: 03-31-1986 (35 y.o. F) Treating RN: Carlene Coria Primary Care Livie Vanderhoof: Jac Canavan Other Clinician: Referring Sanel Stemmer: Jac Canavan Treating Eagan Shifflett/Extender: Skipper Cliche in Treatment: 23 Encounter Discharge Information Items Discharge Condition: Stable Ambulatory Status: Wheelchair Discharge Destination: Home Transportation: Private Auto Accompanied By: self Schedule Follow-up Appointment: Yes Clinical Summary of Care: Patient Declined Electronic Signature(s) Signed: 07/14/2022 12:25:01 PM By: Carlene Coria RN Entered By: Carlene Coria on 07/10/2022 08:31:41 [name] ([id number]) -------------------------------------------------------------------------------- Lower Extremity Assessment Details Patient Name: [name] Date of Service: 07/10/2022 8:00 AM Medical Record Number: [id number] Patient Account Number: [id number] Date of Birth/Sex: 08-Feb-1986 (35 y.o. F) Treating RN: Carlene Coria Primary Care Kaleab Frasier: Jac Canavan Other Clinician: Referring Blia Totman: Jac Canavan Treating Javiana Anwar/Extender: Skipper Cliche in Treatment: 23 Electronic Signature(s) Signed: 07/14/2022 12:25:01 PM By: Carlene Coria RN Entered By: Carlene Coria on 07/10/2022 08:11:46 [name] ([id number]) -------------------------------------------------------------------------------- Multi Wound Chart Details Patient Name: [name] Date of Service: 07/10/2022 8:00 AM Medical Record Number: [id number] Patient Account Number: [id number] Date of Birth/Sex: 1986/06/10 (35 y.o. F) Treating RN: Carlene Coria Primary Care Huy Majid: Jac Canavan Other Clinician: Referring Prima Rayner: Jac Canavan Treating Lycia Sachdeva/Extender: Skipper Cliche in Treatment: 23 Vital Signs Height(in): 67 Pulse(bpm): 116 Weight(lbs): 219 Blood Pressure(mmHg): 133/85 Body Mass Index(BMI): 34.3 Temperature(F): 98 Respiratory Rate(breaths/min): 18 Photos: [N/A:N/A] Wound Location: Right Amputation Site - Below Knee N/A N/A Wounding Event: Gradually Appeared N/A  N/A Primary Etiology: Abscess N/A N/A Comorbid History: Hypertension, Type II Diabetes N/A N/A Date Acquired: 01/14/2022 N/A N/A Weeks of Treatment: 23 N/A N/A Wound Status: Open N/A N/A Wound Recurrence: No N/A N/A Measurements L x W x D (cm) 0x0x0 N/A N/A Area (cm) : 0 N/A N/A Volume (cm) : 0 N/A N/A % Reduction in Area: 100.00% N/A N/A % Reduction in Volume: 100.00% N/A N/A Classification: Full Thickness With Exposed N/A N/A Support Structures Exudate Amount: Medium N/A N/A Exudate Type: Serosanguineous N/A N/A Exudate Color: red, brown  N/A N/A Wound Margin: Flat and Intact N/A N/A Granulation Amount: Large (67-100%) N/A N/A Granulation Quality: Red N/A N/A Necrotic Amount: None Present (0%) N/A N/A Exposed Structures: Fat Layer (Subcutaneous Tissue): N/A N/A Yes Fascia: No Tendon: No Muscle: No Joint: No Bone: No Epithelialization: Small (1-33%) N/A N/A Treatment Notes Electronic Signature(s) Signed: 07/14/2022 12:25:01 PM By: Yevonne Pax RN Entered By: Yevonne Pax on 07/10/2022 08:31:09 [name] ([id number]) -------------------------------------------------------------------------------- Multi-Disciplinary Care Plan Details Patient Name: [name] Date of Service: 07/10/2022 8:00 AM Medical Record Number: [id number] Patient Account Number: [id number] Date of Birth/Sex: 1985-12-12 (35 y.o. F) Treating RN: Yevonne Pax Primary Care Addis Tuohy: Lynnell Chad Other Clinician: Referring Iliani Vejar: Lynnell Chad Treating Ell Tiso/Extender: Rowan Blase in Treatment: 23 Active Inactive Electronic Signature(s) Signed: 07/14/2022 12:25:01 PM By: Yevonne Pax RN Entered By: Yevonne Pax on 07/10/2022 08:31:03 [name] ([id number]) -------------------------------------------------------------------------------- Pain Assessment Details Patient Name: [name] Date of Service: 07/10/2022 8:00 AM Medical Record Number: [id number] Patient Account  Number: [id number] Date of Birth/Sex: 1986-05-18 (35 y.o. F) Treating RN: Yevonne Pax Primary Care Genita Nilsson: Lynnell Chad Other Clinician: Referring Jinx Gilden: Lynnell Chad Treating Izaias Krupka/Extender: Rowan Blase in Treatment: 23 Active Problems Location of Pain Severity and Description of Pain Patient Has Paino No Site Locations Pain Management and Medication Current Pain Management: Electronic Signature(s) Signed: 07/14/2022 12:25:01 PM By: Yevonne Pax RN Entered By: Yevonne Pax on 07/10/2022 08:07:44 [name] ([id number]) -------------------------------------------------------------------------------- Patient/Caregiver Education Details Patient Name: [name] Date of Service: 07/10/2022 8:00 AM Medical Record Number: [id number] Patient Account Number: [id number] Date of Birth/Gender: June 24, 1986 (35 y.o. F) Treating RN: Yevonne Pax Primary Care Physician: Lynnell Chad Other Clinician: Referring Physician: Lynnell Chad Treating Physician/Extender: Rowan Blase in Treatment: 75 Education Assessment Education Provided To: Patient Education Topics Provided Wound/Skin Impairment: Methods: Explain/Verbal Responses: State content correctly Electronic Signature(s) Signed: 07/14/2022 12:25:01 PM By: Yevonne Pax RN Entered By: Yevonne Pax on 07/10/2022 08:30:40 [name] ([id number]) -------------------------------------------------------------------------------- Wound Assessment Details Patient Name: [name] Date of Service: 07/10/2022 8:00 AM Medical Record Number: [id number] Patient Account Number: [id number] Date of Birth/Sex: May 13, 1986 (35 y.o. F) Treating RN: Yevonne Pax Primary Care Sunni Richardson: Lynnell Chad Other Clinician: Referring Deneka Greenwalt: Lynnell Chad Treating Altair Appenzeller/Extender: Rowan Blase in Treatment: 23 Wound Status Wound Number: 1 Primary Etiology: Abscess Wound Location: Right Amputation Site - Below  Knee Wound Status: Healed - Epithelialized Wounding Event: Gradually Appeared Comorbid History: Hypertension, Type II Diabetes Date Acquired: 01/14/2022 Weeks Of Treatment: 23 Clustered Wound: No Photos Wound Measurements Length: (cm) 0 Width: (cm) 0 Depth: (cm) 0 Area: (cm) 0 Volume: (cm) 0 % Reduction in Area: 100% % Reduction in Volume: 100% Epithelialization: Small (1-33%) Tunneling: No Undermining: No Wound Description Classification: Full Thickness With Exposed Support Structures Wound Margin: Flat and Intact Exudate Amount: None Present Foul Odor After Cleansing: No Slough/Fibrino No Wound Bed Granulation Amount: None Present (0%) Exposed Structure Necrotic Amount: None Present (0%) Fascia Exposed: No Fat Layer (Subcutaneous Tissue) Exposed: No Tendon Exposed: No Muscle Exposed: No Joint Exposed: No Bone Exposed: No Treatment Notes Wound #1 (Amputation Site - Below Knee) Wound Laterality: Right Cleanser Peri-Wound Care Topical Primary Dressing [name], [name] ([id number]) Secondary Dressing Secured With Compression Wrap Compression Stockings Add-Ons Electronic Signature(s) Signed: 07/14/2022 12:25:01 PM By: Yevonne Pax RN Entered By: Yevonne Pax on 07/10/2022 08:32:45 [name] ([id number]) -------------------------------------------------------------------------------- Vitals Details Patient Name: [name] Date of Service: 07/10/2022 8:00 AM Medical Record Number: [id number] Patient Account Number: [id number] Date of Birth/Sex: [birth year] (36 y.o. F)  Treating RN: Yevonne Pax Primary Care Keno Caraway: Lynnell Chad Other Clinician: Referring Sherrol Vicars: Lynnell Chad Treating Gini Caputo/Extender: Rowan Blase in Treatment: 23 Vital Signs Time Taken: 08:07 Temperature (F): 98 Height (in): 67 Pulse (bpm): 116 Weight (lbs): 219 Respiratory Rate (breaths/min): 18 Body Mass Index (BMI): 34.3 Blood Pressure (mmHg): 133/85 Reference  Range: 80 - 120 mg / dl Electronic Signature(s) Signed: 07/14/2022 12:25:01 PM By: Yevonne Pax RN Entered By: Yevonne Pax on 07/10/2022 08:07:34

## 2022-07-17 ENCOUNTER — Encounter: Payer: Medicaid Other | Admitting: Physician Assistant

## 2022-07-24 ENCOUNTER — Encounter: Payer: Medicaid Other | Admitting: Physician Assistant

## 2022-07-31 ENCOUNTER — Encounter: Payer: Medicaid Other | Admitting: Physician Assistant

## 2022-08-05 ENCOUNTER — Other Ambulatory Visit: Payer: Self-pay | Admitting: Nurse Practitioner

## 2022-08-05 DIAGNOSIS — E1165 Type 2 diabetes mellitus with hyperglycemia: Secondary | ICD-10-CM

## 2022-08-08 ENCOUNTER — Ambulatory Visit (INDEPENDENT_AMBULATORY_CARE_PROVIDER_SITE_OTHER): Payer: Medicaid Other | Admitting: Nurse Practitioner

## 2022-08-08 ENCOUNTER — Encounter: Payer: Self-pay | Admitting: Nurse Practitioner

## 2022-08-08 DIAGNOSIS — E781 Pure hyperglyceridemia: Secondary | ICD-10-CM | POA: Insufficient documentation

## 2022-08-08 DIAGNOSIS — Z89511 Acquired absence of right leg below knee: Secondary | ICD-10-CM

## 2022-08-08 DIAGNOSIS — E119 Type 2 diabetes mellitus without complications: Secondary | ICD-10-CM

## 2022-08-08 DIAGNOSIS — Z6833 Body mass index (BMI) 33.0-33.9, adult: Secondary | ICD-10-CM | POA: Diagnosis not present

## 2022-08-08 DIAGNOSIS — R110 Nausea: Secondary | ICD-10-CM

## 2022-08-08 DIAGNOSIS — I10 Essential (primary) hypertension: Secondary | ICD-10-CM

## 2022-08-08 DIAGNOSIS — E1165 Type 2 diabetes mellitus with hyperglycemia: Secondary | ICD-10-CM

## 2022-08-08 LAB — BAYER DCA HB A1C WAIVED: HB A1C (BAYER DCA - WAIVED): 6.9 % — ABNORMAL HIGH (ref 4.8–5.6)

## 2022-08-08 MED ORDER — LANTUS SOLOSTAR 100 UNIT/ML ~~LOC~~ SOPN: 15.0000 [IU] | PEN_INJECTOR | Freq: Every day | SUBCUTANEOUS | 1 refills | Status: DC

## 2022-08-08 MED ORDER — GABAPENTIN 300 MG PO CAPS: 300.0000 mg | ORAL_CAPSULE | Freq: Three times a day (TID) | ORAL | 3 refills | Status: DC

## 2022-08-08 MED ORDER — OMEGA-3 FATTY ACIDS 1000 MG PO CAPS: 2.0000 g | ORAL_CAPSULE | Freq: Every day | ORAL | 1 refills | Status: DC

## 2022-08-08 MED ORDER — SEMAGLUTIDE (2 MG/DOSE) 8 MG/3ML ~~LOC~~ SOPN: 2.0000 mg | PEN_INJECTOR | SUBCUTANEOUS | 2 refills | Status: DC

## 2022-08-08 MED ORDER — NIFEDIPINE ER OSMOTIC RELEASE 30 MG PO TB24: 30.0000 mg | ORAL_TABLET | Freq: Every day | ORAL | 0 refills | Status: DC

## 2022-08-08 MED ORDER — NOVOLOG FLEXPEN RELION 100 UNIT/ML ~~LOC~~ SOPN: 5.0000 [IU] | PEN_INJECTOR | Freq: Three times a day (TID) | SUBCUTANEOUS | 1 refills | Status: DC

## 2022-08-08 MED ORDER — PRECISION QID TEST VI STRP: ORAL_STRIP | 1 refills | Status: DC

## 2022-08-08 MED ORDER — ONDANSETRON HCL 4 MG PO TABS: 4.0000 mg | ORAL_TABLET | Freq: Three times a day (TID) | ORAL | 0 refills | Status: DC | PRN

## 2022-08-08 NOTE — Assessment & Plan Note (Signed)
Continue low-cholesterol diet.  Repeat lipid panel, continue omega-3 fatty acid.

## 2022-08-08 NOTE — Assessment & Plan Note (Signed)
Hypertension well-controlled on current medication.  Patient is currently on Procardia.  Continue blood pressure monitoring follow-up with on resolved symptoms.

## 2022-08-08 NOTE — Progress Notes (Signed)
Virtual Visit  Note Due to COVID-19 pandemic this visit was conducted virtually. This visit type was conducted due to national recommendations for restrictions regarding the COVID-19 Pandemic (e.g. social distancing, sheltering in place) in an effort to limit this patient's exposure and mitigate transmission in our community. All issues noted in this document were discussed and addressed.  A physical exam was not performed with this format.  I connected with [name] on 08/08/22 at 8 AM by telephone and verified that I am speaking with the correct person using two identifiers. [name] is currently located at home during visit. The provider, Ivy Lynn, NP is located in their office at time of visit.  I discussed the limitations, risks, security and privacy concerns of performing an evaluation and management service by telephone and the availability of in person appointments. I also discussed with the patient that there may be a patient responsible charge related to this service. The patient expressed understanding and agreed to proceed.   History and Present Illness:  HPI  Diabetes Mellitus Type II, Follow-up  Lab Results  Component Value Date   HGBA1C 6.4 (H) 01/09/2022   HGBA1C 7.3 (H) 12/22/2018   HGBA1C 10.6 (H) 06/20/2018   Wt Readings from Last 3 Encounters:  02/10/22 192 lb (87.1 kg)  01/13/22 236 lb (107 kg)  11/30/21 236 lb 15.9 oz (107.5 kg)   Last seen for diabetes 3 months ago.  Management since then includes Ozempic 2 mg tablet by mouth weekly, short acting insulin 5 units 3 times daily.  Patient is only taking 3 units 3 times daily.  Long-acting insulin 15 units patient is only taking 10 units.. She reports good compliance with treatment. She is having side effects.  Hypoglycemia with initial dosing.  Symptoms: No fatigue No foot ulcerations  No appetite changes Yes nausea  No paresthesia of the feet  No polydipsia  No polyuria No visual  disturbances   No vomiting     Home blood sugar records: fasting range: 96-126  Episodes of hypoglycemia? Yes 5 times in the past 3 months   Current insulin regiment: {Short-acting insulin 3 units at bedtime 5 units breakfast and lunch.  Long-acting insulin 10 units, Ozempic 2 mg weekly. Most Recent Eye Exam: 2023 Current exercise: none Current diet habits: diabetic  Pertinent Labs: Lab Results  Component Value Date   CHOL 191 01/09/2022   HDL 35 (L) 01/09/2022   LDLCALC 111 (H) 01/09/2022   TRIG 260 (H) 01/09/2022   CHOLHDL 5.5 (H) 01/09/2022   Lab Results  Component Value Date   NA 138 01/09/2022   K 4.4 01/09/2022   CREATININE 0.72 01/09/2022   EGFR 112 01/09/2022   LABMICR See below: 04/19/2016    Hypertension, follow-up  BP Readings from Last 3 Encounters:  02/10/22 122/82  01/13/22 101/68  11/30/21 125/86   Wt Readings from Last 3 Encounters:  02/10/22 192 lb (87.1 kg)  01/13/22 236 lb (107 kg)  11/30/21 236 lb 15.9 oz (107.5 kg)     She was last seen for hypertension 3 months ago.  BP at that visit was documented in chart. Management since that visit includes NIFEdipine 30 mg 24 hr tablet  She reports good compliance with treatment. She is not having side effects.  She is following a  diet. She  exercising. She  smoke.  Use of agents associated with hypertension: none.   Outside blood pressures are.  Patient did not document blood pressure today before  phone call. Symptoms: No chest pain No chest pressure  No palpitations No syncope  No dyspnea No orthopnea  No paroxysmal nocturnal dyspnea No lower extremity edema   Pertinent labs Lab Results  Component Value Date   CHOL 191 01/09/2022   HDL 35 (L) 01/09/2022   LDLCALC 111 (H) 01/09/2022   TRIG 260 (H) 01/09/2022   CHOLHDL 5.5 (H) 01/09/2022   Lab Results  Component Value Date   NA 138 01/09/2022   K 4.4 01/09/2022   CREATININE 0.72 01/09/2022   EGFR 112 01/09/2022   GLUCOSE 80  01/09/2022   TSH 1.450 06/20/2018     The ASCVD Risk score (Arnett DK, et al., 2019) failed to calculate for the following reasons:   The 2019 ASCVD risk score is only valid for ages 15 to 16  Lipid/Cholesterol, Follow-up  Last lipid panel Other pertinent labs  Lab Results  Component Value Date   CHOL 191 01/09/2022   HDL 35 (L) 01/09/2022   LDLCALC 111 (H) 01/09/2022   TRIG 260 (H) 01/09/2022   CHOLHDL 5.5 (H) 01/09/2022   Lab Results  Component Value Date   ALT 11 01/09/2022   AST 14 01/09/2022   PLT 380 01/09/2022   TSH 1.450 06/20/2018     She was last seen for this 3 months ago.  Management since that visit includes fish oil/omega-3 fatty acids 1000 mg capsule.  She reports good compliance with treatment. She is not having side effects.    Symptoms: No chest pain No chest pressure/discomfort  No dyspnea No lower extremity edema  No numbness or tingling of extremity No orthopnea  No palpitations No paroxysmal nocturnal dyspnea  No speech difficulty No syncope   Current diet: low fat/ cholesterol Current exercise: none  The ASCVD Risk score (Arnett DK, et al., 2019) failed to calculate for the following reasons:   The 2019 ASCVD risk score is only valid for ages 35 to 63     ROS   Observations/Objective: Televisit patient not in distress.  Assessment and Plan: Diabetes mellitus type 2, noninsulin dependent ([place]) Patient is reporting hypoglycemia 5 episode in the last 3 months.  Reassess all medication.  Patient is currently taking 3 units of short acting insulin at nighttime and 5 units for breakfast and lunch.  Patient is taking 10 units instead of 15 units of long-acting.  And 2 mg of Ozempic every 2 weeks.  Patient has not had Ozempic this week due to shortages.  Patient made adjustments due to hypoglycemia episodes.  And has been well controlled ever since.  Completed A1c today, CBC CMP.  We will schedule patient with clinical pharmacy to readjust  medication after lab results.  Continue diabetic diet, follow-up in 3 months.  Nausea Intermittent nausea due to Ozempic use.  Zofran 4 mg tablet as needed sent to pharmacy.  Chronic hypertension Hypertension well-controlled on current medication.  Patient is currently on Procardia.  Continue blood pressure monitoring follow-up with on resolved symptoms.  High triglycerides Continue low-cholesterol diet.  Repeat lipid panel, continue omega-3 fatty acid.   Follow Up Instructions: Follow-up in 3 months.    I discussed the assessment and treatment plan with the patient. The patient was provided an opportunity to ask questions and all were answered. The patient agreed with the plan and demonstrated an understanding of the instructions.   The patient was advised to call back or seek an in-person evaluation if the symptoms worsen or if the condition fails to improve  as anticipated.  The above assessment and management plan was discussed with the patient. The patient verbalized understanding of and has agreed to the management plan. Patient is aware to call the clinic if symptoms persist or worsen. Patient is aware when to return to the clinic for a follow-up visit. Patient educated on when it is appropriate to go to the emergency department.   Time call ended: 8:20 AM  I provided 20 minutes of  non face-to-face time during this encounter.    Ivy Lynn, NP

## 2022-08-08 NOTE — Assessment & Plan Note (Signed)
Intermittent nausea due to Ozempic use.  Zofran 4 mg tablet as needed sent to pharmacy.

## 2022-08-08 NOTE — Progress Notes (Deleted)
   Established Patient Office Visit  Subjective   Patient ID: [name], female    DOB: [birth year]  Age: 36 y.o. MRN: [id number]  Chief Complaint  Patient presents with   chronic disease managemnt    HPI  {History (Optional):23778}  ROS    Objective:     There were no vitals taken for this visit. {Vitals History (Optional):23777}  Physical Exam   No results found for any visits on 08/08/22.  {Labs (Optional):23779}  The ASCVD Risk score (Arnett DK, et al., 2019) failed to calculate for the following reasons:   The 2019 ASCVD risk score is only valid for ages 59 to 10    Assessment & Plan:   Problem List Items Addressed This Visit       Cardiovascular and Mediastinum   Chronic hypertension   Relevant Medications   NIFEdipine (PROCARDIA-XL/NIFEDICAL-XL) 30 MG 24 hr tablet     Endocrine   Diabetes mellitus type 2, noninsulin dependent (HCC) - Primary   Relevant Medications   insulin glargine (LANTUS SOLOSTAR) 100 UNIT/ML Solostar Pen   NOVOLOG FLEXPEN 100 UNIT/ML FlexPen   Semaglutide, 2 MG/DOSE, 8 MG/3ML SOPN   Other Relevant Orders   CBC with Differential   CMP14+EGFR   Lipid Panel   Bayer DCA Hb A1c Waived   Type 2 diabetes mellitus with hyperglycemia (HCC)   Relevant Medications   insulin glargine (LANTUS SOLOSTAR) 100 UNIT/ML Solostar Pen   glucose blood (PRECISION QID TEST) test strip   NOVOLOG FLEXPEN 100 UNIT/ML FlexPen   Semaglutide, 2 MG/DOSE, 8 MG/3ML SOPN     Other   BMI 33.0-33.9,adult   Nausea   Relevant Medications   ondansetron (ZOFRAN) 4 MG tablet   Other Visit Diagnoses     Status post below-knee amputation of right lower extremity (HCC)       Relevant Medications   gabapentin (NEURONTIN) 300 MG capsule   High triglycerides       Relevant Medications   fish oil-omega-3 fatty acids 1000 MG capsule   NIFEdipine (PROCARDIA-XL/NIFEDICAL-XL) 30 MG 24 hr tablet       No follow-ups on file.    Ivy Lynn, NP

## 2022-08-08 NOTE — Assessment & Plan Note (Signed)
Patient is reporting hypoglycemia 5 episode in the last 3 months.  Reassess all medication.  Patient is currently taking 3 units of short acting insulin at nighttime and 5 units for breakfast and lunch.  Patient is taking 10 units instead of 15 units of long-acting.  And 2 mg of Ozempic every 2 weeks.  Patient has not had Ozempic this week due to shortages.  Patient made adjustments due to hypoglycemia episodes.  And has been well controlled ever since.  Completed A1c today, CBC CMP.  We will schedule patient with clinical pharmacy to readjust medication after lab results.  Continue diabetic diet, follow-up in 3 months.

## 2022-08-09 LAB — LIPID PANEL
Chol/HDL Ratio: 5.9 ratio — ABNORMAL HIGH (ref 0.0–4.4)
Cholesterol, Total: 200 mg/dL — ABNORMAL HIGH (ref 100–199)
HDL: 34 mg/dL — ABNORMAL LOW (ref >39)
LDL Chol Calc (NIH): 124 mg/dL — ABNORMAL HIGH (ref 0–99)
Triglycerides: 235 mg/dL — ABNORMAL HIGH (ref 0–149)
VLDL Cholesterol Cal: 42 mg/dL — ABNORMAL HIGH (ref 5–40)

## 2022-08-09 LAB — CBC WITH DIFFERENTIAL/PLATELET
Basophils Absolute: 0.1 10*3/uL (ref 0.0–0.2)
Basos: 1 %
EOS (ABSOLUTE): 0.1 10*3/uL (ref 0.0–0.4)
Eos: 1 %
Hematocrit: 39.3 % (ref 34.0–46.6)
Hemoglobin: 13.1 g/dL (ref 11.1–15.9)
Immature Grans (Abs): 0 10*3/uL (ref 0.0–0.1)
Immature Granulocytes: 0 %
Lymphocytes Absolute: 3.4 10*3/uL — ABNORMAL HIGH (ref 0.7–3.1)
Lymphs: 41 %
MCH: 29.2 pg (ref 26.6–33.0)
MCHC: 33.3 g/dL (ref 31.5–35.7)
MCV: 88 fL (ref 79–97)
Monocytes Absolute: 0.4 10*3/uL (ref 0.1–0.9)
Monocytes: 4 %
Neutrophils Absolute: 4.4 10*3/uL (ref 1.4–7.0)
Neutrophils: 53 %
Platelets: 475 10*3/uL — ABNORMAL HIGH (ref 150–450)
RBC: 4.48 x10E6/uL (ref 3.77–5.28)
RDW: 13.2 % (ref 11.7–15.4)
WBC: 8.3 10*3/uL (ref 3.4–10.8)

## 2022-08-09 LAB — CMP14+EGFR
ALT: 9 IU/L (ref 0–32)
AST: 12 IU/L (ref 0–40)
Albumin/Globulin Ratio: 1.1 — ABNORMAL LOW (ref 1.2–2.2)
Albumin: 3.5 g/dL — ABNORMAL LOW (ref 3.9–4.9)
Alkaline Phosphatase: 59 IU/L (ref 44–121)
BUN/Creatinine Ratio: 25 — ABNORMAL HIGH (ref 9–23)
BUN: 20 mg/dL (ref 6–20)
Bilirubin Total: 0.4 mg/dL (ref 0.0–1.2)
CO2: 22 mmol/L (ref 20–29)
Calcium: 9.1 mg/dL (ref 8.7–10.2)
Chloride: 104 mmol/L (ref 96–106)
Creatinine, Ser: 0.79 mg/dL (ref 0.57–1.00)
Globulin, Total: 3.3 g/dL (ref 1.5–4.5)
Glucose: 68 mg/dL — ABNORMAL LOW (ref 70–99)
Potassium: 4.2 mmol/L (ref 3.5–5.2)
Sodium: 140 mmol/L (ref 134–144)
Total Protein: 6.8 g/dL (ref 6.0–8.5)
eGFR: 101 mL/min/{1.73_m2} (ref >59)

## 2022-09-08 ENCOUNTER — Other Ambulatory Visit: Payer: Self-pay | Admitting: Nurse Practitioner

## 2022-09-08 ENCOUNTER — Telehealth: Payer: Self-pay | Admitting: Nurse Practitioner

## 2022-09-08 DIAGNOSIS — Z89511 Acquired absence of right leg below knee: Secondary | ICD-10-CM

## 2022-09-08 NOTE — Telephone Encounter (Signed)
REFERRAL REQUEST Telephone Note  Have you been seen at our office for this problem? yes (Advise that they may need an appointment with their PCP before a referral can be done)  Reason for Referral:physical therapy for prosthetic leg, pt gets prosthetic leg on 09/09/2022 Referral discussed with patient: yes  Best contact number of patient for referral team: [phone]    Has patient been seen by a specialist for this issue before: no  Patient provider preference for referral: [facility] Out pt rehabilitation Patient location preference for referral: [place] Fax#[phone]   Patient notified that referrals can take up to a week or longer to process. If they haven't heard anything within a week they should call back and speak with the referral department.

## 2022-09-08 NOTE — Telephone Encounter (Signed)
Referral completed

## 2022-09-27 ENCOUNTER — Encounter: Payer: Self-pay | Admitting: Physical Therapy

## 2022-09-27 ENCOUNTER — Other Ambulatory Visit: Payer: Self-pay

## 2022-09-27 ENCOUNTER — Ambulatory Visit: Payer: Medicaid Other | Attending: Nurse Practitioner | Admitting: Physical Therapy

## 2022-09-27 VITALS — BP 139/97 | HR 106

## 2022-09-27 DIAGNOSIS — R2681 Unsteadiness on feet: Secondary | ICD-10-CM | POA: Insufficient documentation

## 2022-09-27 DIAGNOSIS — M6281 Muscle weakness (generalized): Secondary | ICD-10-CM | POA: Insufficient documentation

## 2022-09-27 DIAGNOSIS — R2689 Other abnormalities of gait and mobility: Secondary | ICD-10-CM | POA: Diagnosis present

## 2022-09-27 NOTE — Therapy (Unsigned)
OUTPATIENT PHYSICAL THERAPY PROSTHETICS EVALUATION   Patient Name: [name] MRN: [id number] DOB:January 17, 1986, 36 y.o., female Today's Date: 09/28/2022  PCP: Daryll Drown, NP (pt states she is having to find a new provider as current one is leaving practice) REFERRING PROVIDER: Daryll Drown, NP  END OF SESSION:  PT End of Session - 09/27/22 1155     Visit Number 1    Number of Visits 9   8+eval   Date for PT Re-Evaluation 11/24/22    Authorization Type [place] MEDICAID HEALTHY BLUE    PT Start Time 1146    PT Stop Time 1230    PT Time Calculation (min) 44 min    Equipment Utilized During Treatment Gait belt    Activity Tolerance Patient tolerated treatment well    Behavior During Therapy WFL for tasks assessed/performed             Past Medical History:  Diagnosis Date   Chronic hypertension    12-16-2020  documented in epic since 2017 pt dx hypertension (not during pregnancy);  pt currently is not and has not been taking any bp med,  bp at [facility] 12-15-2020 165/ 104   Missed ab 12/15/2020   Type 2 diabetes mellitus (HCC)    12-16-2020  pt stated only during pregnancy took medication, at [facility] 12-15-2020 blood glucose 353 pt was given metformin bid and pt stated did start taking it   Wears glasses    Past Surgical History:  Procedure Laterality Date   BELOW KNEE LEG AMPUTATION Right    CESAREAN SECTION N/A 12/25/2018   Procedure: CESAREAN SECTION;  Surgeon: Levie Heritage, DO;  Location: [facility] LD ORS;  Service: Obstetrics;  Laterality: N/A;   TONSILLECTOMY AND ADENOIDECTOMY  age 14   Patient Active Problem List   Diagnosis Date Noted   High triglycerides 08/08/2022   Chronic pain of right knee 01/13/2022   Nausea 01/13/2022   Morbid obesity (HCC) 11/30/2021   Type 2 diabetes mellitus with hyperglycemia (HCC) 11/30/2021   Non-healing amputation site (HCC) 10/12/2021   Necrotizing fasciitis of ankle and foot (HCC) 10/05/2021   Hypokalemia 10/02/2021    Hypoalbuminemia 09/26/2021   Positive for macroalbuminuria 09/26/2021   Acute blood loss anemia 09/22/2021   ATN (acute tubular necrosis) (HCC) 09/22/2021   AKI (acute kidney injury) (HCC) 09/20/2021   Hyponatremia 09/19/2021   Diabetes mellitus type 2, noninsulin dependent (HCC) 09/19/2021   Chronic hypertension 12/16/2020   Missed abortion 12/16/2020   Status post cesarean delivery 12/15/2020   Establishing care with new doctor, encounter for 12/22/2018   Pap smear abnormality of cervix/human papillomavirus (HPV) positive 06/25/2018   BMI 33.0-33.9,adult 03/30/2016    ONSET DATE: 09/08/2022 (received prosthetic on 09/19/2022)  REFERRING DIAG: Z89.511 (ICD-10-CM) - Status post below-knee amputation of right lower extremity (HCC)  THERAPY DIAG:  Other abnormalities of gait and mobility  Muscle weakness (generalized)  Unsteadiness on feet  Rationale for Evaluation and Treatment: Rehabilitation  SUBJECTIVE:   SUBJECTIVE STATEMENT: Pt recently received new prosthetic.  She is s/p right BKA in the setting of necrotizing fasciitis by Dr.Long on 10-12-2021.  Followed by Dr. Loreta Ave at [facility].  She is followed by Reita Cliche at [facility].  She will see him again 10/10/2022. Inclines like ramps and stairs are very difficult for patient. Pt accompanied by: family member-uncle drove her today, but pt states she has returned to driving.  PERTINENT HISTORY: HTN, DM2-on insulin  She is s/p right BKA in the setting  of necrotizing fasciitis by Dr.Long on 10-12-2021.  Followed by Dr. Loreta Ave at [facility].  She is followed by Reita Cliche at [facility].  PAIN:  Are you having pain? No  PRECAUTIONS: Fall and Other: pt has recently been hypoglycemic frequently  WEIGHT BEARING RESTRICTIONS: No  FALLS: Has patient fallen in last 6 months? No  LIVING ENVIRONMENT: Lives with: lives with their son -son is 47 years old Lives in: House/apartment Home Access: Level entry Home layout: Two level Stairs: Yes:  Internal: 16 steps; on left going up Has following equipment at home: Counselling psychologist, Environmental consultant - 2 wheeled, Environmental consultant - 4 wheeled, and Wheelchair (manual)  PLOF: Independent with basic ADLs, Independent with household mobility with device, Independent with community mobility with device, and Needs assistance with transfers-has been butt bumping and going up stairs on knees  PATIENT GOALS: Pt would like to work on posture, stairs and unlevel surfaces, and get away from rollator.    OBJECTIVE:   DIAGNOSTIC FINDINGS: No recent relevant imaging.  COGNITION: Overall cognitive status: Within functional limits for tasks assessed   SENSATION: WFL  POSTURE: No Significant postural limitations  LOWER EXTREMITY ROM:  Active ROM Right eval Left eval  Hip flexion Functionally assessed:  WFL  Hip extension   Hip abduction   Hip adduction   Hip internal rotation   Hip external rotation   Knee flexion   Knee extension   Ankle dorsiflexion  "  Ankle plantarflexion    Ankle inversion    Ankle eversion     (Blank rows = not tested)  LOWER EXTREMITY MMT:  MMT Right eval Left eval  Hip flexion 4+/5 4+/5  Hip extension    Hip abduction 4/5 4+/5  Hip adduction    Hip internal rotation    Hip external rotation    Knee flexion 4/5 5/5  Knee extension 4/5 5/5  Ankle dorsiflexion  5/5  Ankle plantarflexion    Ankle inversion    Ankle eversion    (Blank rows = not tested)  BED MOBILITY:  Sit to supine Complete Independence Supine to sit Complete Independence  TRANSFERS: Sit to stand: Complete Independence Stand to sit: Complete Independence  RAMP: SBA  GAIT: Gait pattern: step through pattern, knee flexed in stance- Right, and trunk flexed Distance walked: 120' Assistive device utilized: Environmental consultant - 4 wheeled Level of assistance: SBA  FUNCTIONAL TESTS:  5 times sit to stand: 15.82 sec w/ BUE support 10 meter walk test: 13.92 sec w/ rollator = 0.72 m/sec OR 2.37  ft/sec Berg Balance Scale: TBD  CURRENT PROSTHETIC WEAR ASSESSMENT: Patient is independent with: skin check, residual limb care, care of non-amputated limb, prosthetic cleaning, ply sock cleaning, correct ply sock adjustment, proper wear schedule/adjustment, and proper weight-bearing schedule/adjustment Patient is dependent with:  none Donning prosthesis: Complete Independence Doffing prosthesis: Complete Independence Prosthetic wear tolerance: (Christmas day was on for 6 hours) hours/day, 7 days/week Prosthetic weight bearing tolerance: 10 minutes Edema: pt reports limb volume stable, wearing shrinker when prosthesis is off except when A&D ointment is on at night Residual limb condition: skin intact, no wounds, incision healed Prosthetic description: Patellar-tendon bearing prosthesis K code/activity level with prosthetic use: Level 3  PATIENT SURVEYS:  None completed due to time.  TODAY'S TREATMENT:  DATE: N/A  TREATMENT:  PATIENT EDUCATED ON FOLLOWING PROSTHETIC CARE: Prosthetic wear tolerance: 1-1.5 hours/day, 7 days/week Prosthetic weight bearing tolerance: 10 minutes Other education  Skin check, Residual limb care, Proper wear schedule/adjustment, and Proper weight-bearing schedule/adjustment  PATIENT EDUCATION: Education details: PT POC, assessments used, and goals to be set. Person educated: Patient Education method: Explanation Education comprehension: verbalized understanding  HOME EXERCISE PROGRAM: To be established.  ASSESSMENT:  CLINICAL IMPRESSION: Patient is a 36 y.o. female who was seen today for physical therapy evaluation and treatment s/p right BKA w/ recent receipt of prosthetic.  Pt has a significant PMH of HTN and DM2.  Identified impairments include right LE weakness, abnormalities ambulating over inclined surfaces,  postural difficulty with ambulatory tasks, and reliance on rollator for mobility.  Evaluation via the following assessment tools: 5xSTS and indicate fall risk.  BERG to be assessed next visit to further determine fall risk and specific deficits with static balance hindering her ambulatory progression.  She would benefit from skilled PT to address impairments as noted and progress towards long term goals.  OBJECTIVE IMPAIRMENTS: Abnormal gait, decreased balance, decreased mobility, difficulty walking, decreased strength, and postural dysfunction.   ACTIVITY LIMITATIONS: carrying, lifting, bending, squatting, stairs, transfers, and locomotion level  PARTICIPATION LIMITATIONS: community activity  PERSONAL FACTORS: Fitness, Past/current experiences, and 1-2 comorbidities: HTN and DM2  are also affecting patient's functional outcome.   REHAB POTENTIAL: Excellent  CLINICAL DECISION MAKING: Stable/uncomplicated  EVALUATION COMPLEXITY: Low   GOALS: Goals reviewed with patient? Yes  SHORT TERM GOALS: Target date: 10/27/2022  Pt will be independent with initial strength and balance HEP to maintain progress. Baseline:  To be established. Goal status: INITIAL  2.  BERG to be assessed w/ STG/LTG set as appropriate. Baseline: To be assessed. Goal status: INITIAL  3.  Pt will demonstrate a gait speed of >/=2.77 feet/sec using LRAD in order to decrease risk for falls. Baseline: 2.37 ft/sec w/ rollator Goal status: INITIAL  4.  Pt will decrease 5xSTS to </=13 seconds w/o UE support in order to demonstrate decreased risk for falls and improved functional bilateral LE strength and power. Baseline: 15.82 sec w/ BUE support Goal status: INITIAL  LONG TERM GOALS: Target date: 11/24/2022  Pt will be independent with advanced strength and balance HEP to maintain progress. Baseline: To be established. Goal status: INITIAL  2.  Pt will demonstrate a gait speed of >/=3.17 feet/sec using LRAD in  order to decrease risk for falls. Baseline: 2.37 ft/sec w/ rollator Goal status: INITIAL  3.  BERG to be assessed w/ STG/LTG to be set as appropriate. Baseline: To be assessed. Goal status: INITIAL  PLAN:  PT FREQUENCY: 1x/week  PT DURATION: 8 weeks  PLANNED INTERVENTIONS: Therapeutic exercises, Therapeutic activity, Neuromuscular re-education, Balance training, Gait training, Patient/Family education, Self Care, Joint mobilization, Stair training, Vestibular training, Prosthetic training, DME instructions, Manual therapy, and Re-evaluation  PLAN FOR NEXT SESSION: Assess BERG-set goals, initiate HEP for strength and balance.  Work towards [employer] vs current rollator w/ gait training.  Check all possible CPT codes: 68616 - PT Re-evaluation, 97110- Therapeutic Exercise, [phone]- Neuro Re-education, [phone] - Gait Training, [phone] - Manual Therapy, [phone] - Therapeutic Activities, [phone] - Self Care, and [phone] - Prosthetic training    Check all conditions that are expected to impact treatment: Diabetes mellitus   If treatment provided at initial evaluation, no treatment charged due to lack of authorization.    Sadie Haber, PT, DPT 09/28/2022, 8:44 AM

## 2022-10-05 ENCOUNTER — Ambulatory Visit: Payer: Medicaid Other | Attending: Nurse Practitioner | Admitting: Physical Therapy

## 2022-10-05 ENCOUNTER — Encounter: Payer: Self-pay | Admitting: Physical Therapy

## 2022-10-05 DIAGNOSIS — R2681 Unsteadiness on feet: Secondary | ICD-10-CM | POA: Insufficient documentation

## 2022-10-05 DIAGNOSIS — M6281 Muscle weakness (generalized): Secondary | ICD-10-CM | POA: Insufficient documentation

## 2022-10-05 DIAGNOSIS — R2689 Other abnormalities of gait and mobility: Secondary | ICD-10-CM | POA: Insufficient documentation

## 2022-10-05 NOTE — Patient Instructions (Addendum)
Access Code: [email or web address] URL: [email or web address] Date: 10/05/2022 Prepared by: Elease Etienne  Exercises - Corner Balance Feet Together With Eyes Open  - 1 x daily - 5 x weekly - 1 sets - 3 reps - 30 seconds hold - Standing Quarter Turn with Counter Support  - 1 x daily - 5 x weekly - 1 sets - 12-15 reps - Standing Single Leg Stance with Counter Support  - 1 x daily - 5 x weekly - 1 sets - 3 reps - 30 seconds hold - Corner Balance Feet Together With Eyes Closed  - 1 x daily - 5 x weekly - 1 sets - 3 reps - 30 seconds hold

## 2022-10-05 NOTE — Therapy (Signed)
OUTPATIENT PHYSICAL THERAPY PROSTHETICS EVALUATION   Patient Name: [name] MRN: [id number] DOB:1985/11/08, 37 y.o., female Today's Date: 10/05/2022  PCP: Ivy Lynn, NP (pt states she is having to find a new provider as current one is leaving practice) REFERRING PROVIDER: Ivy Lynn, NP  END OF SESSION:  PT End of Session - 10/05/22 0847     Visit Number 2    Number of Visits 9   8+eval   Date for PT Re-Evaluation 11/24/22    Authorization Type [place] MEDICAID HEALTHY BLUE    PT Start Time 0845    PT Stop Time 0928    PT Time Calculation (min) 43 min    Equipment Utilized During Treatment Gait belt    Activity Tolerance Patient tolerated treatment well    Behavior During Therapy WFL for tasks assessed/performed             Past Medical History:  Diagnosis Date   Chronic hypertension    12-16-2020  documented in epic since 2017 pt dx hypertension (not during pregnancy);  pt currently is not and has not been taking any bp med,  bp at [facility] 12-15-2020 165/ 104   Missed ab 12/15/2020   Type 2 diabetes mellitus ([place])    12-16-2020  pt stated only during pregnancy took medication, at [facility] 12-15-2020 blood glucose 353 pt was given metformin bid and pt stated did start taking it   Wears glasses    Past Surgical History:  Procedure Laterality Date   BELOW KNEE LEG AMPUTATION Right    CESAREAN SECTION N/A 12/25/2018   Procedure: CESAREAN SECTION;  Surgeon: [name], DO;  Location: [place] LD ORS;  Service: Obstetrics;  Laterality: N/A;   TONSILLECTOMY AND ADENOIDECTOMY  age 68   Patient Active Problem List   Diagnosis Date Noted   High triglycerides 08/08/2022   Chronic pain of right knee 01/13/2022   Nausea 01/13/2022   Morbid obesity ([place]) 11/30/2021   Type 2 diabetes mellitus with hyperglycemia ([place]) 11/30/2021   Non-healing amputation site ([place]) 10/12/2021   Necrotizing fasciitis of ankle and foot ([place]) 10/05/2021   Hypokalemia 10/02/2021    Hypoalbuminemia 09/26/2021   Positive for macroalbuminuria 09/26/2021   Acute blood loss anemia 09/22/2021   ATN (acute tubular necrosis) ([place]) 09/22/2021   AKI (acute kidney injury) ([place]) 09/20/2021   Hyponatremia 09/19/2021   Diabetes mellitus type 2, noninsulin dependent ([place]) 09/19/2021   Chronic hypertension 12/16/2020   Missed abortion 12/16/2020   Status post cesarean delivery 12/15/2020   Establishing care with new doctor, encounter for 12/22/2018   Pap smear abnormality of cervix/human papillomavirus (HPV) positive 06/25/2018   BMI 33.0-33.9,adult 03/30/2016    ONSET DATE: 09/08/2022 (received prosthetic on 09/19/2022)  REFERRING DIAG: Z89.511 (ICD-10-CM) - Status post below-knee amputation of right lower extremity (HCC)  THERAPY DIAG:  Other abnormalities of gait and mobility  Muscle weakness (generalized)  Unsteadiness on feet  Rationale for Evaluation and Treatment: Rehabilitation  SUBJECTIVE:   SUBJECTIVE STATEMENT: Pt denies pain or falls.  She states she has no socks on residual limb currently though she usually has to put a 3-ply on.  She will see Mortimer Fries at [place] again 10/10/2022.  Pt accompanied by: family member-uncle drove her today, but pt states she has returned to driving.  PERTINENT HISTORY: HTN, DM2-on insulin  She is s/p right BKA in the setting of necrotizing fasciitis by Dr.Long on 10-12-2021.  Followed by Dr. Earleen Newport at [facility].  She is followed by  Bobby at [place] [facility].  PAIN:  Are you having pain? No  PRECAUTIONS: Fall and Other: pt has recently been hypoglycemic frequently  WEIGHT BEARING RESTRICTIONS: No  OBJECTIVE:   CURRENT PROSTHETIC WEAR ASSESSMENT: Patient is independent with: skin check, residual limb care, care of non-amputated limb, prosthetic cleaning, ply sock cleaning, correct ply sock adjustment, proper wear schedule/adjustment, and proper weight-bearing schedule/adjustment Patient is dependent with:  none Donning prosthesis:  Complete Independence Doffing prosthesis: Complete Independence Prosthetic wear tolerance: 2 hours hours/day, 7 days/week Prosthetic weight bearing tolerance: 10 minutes Edema: pt reports limb volume stable, wearing shrinker when prosthesis is off except when A&D ointment is on at night Residual limb condition: skin intact, no wounds, incision healed; pt had mild soreness with increased wear time, but no skin breakdown. Prosthetic description: Patellar-tendon bearing prosthesis K code/activity level with prosthetic use: Level 3  PATIENT EDUCATED ON FOLLOWING PROSTHETIC CARE: Prosthetic wear tolerance: 2-3  hours, 7 days/week Prosthetic weight bearing tolerance: 10 minutes Other education  Skin check, Residual limb care, Proper wear schedule/adjustment, and Proper weight-bearing schedule/adjustment TODAY'S TREATMENT:                                                                                                                                         DATE:   -Blood sugar 127 when waking this morning. -Assessed BERG:  [facility] PT Assessment - 10/05/22 0852       Standardized Balance Assessment   Standardized Balance Assessment Berg Balance Test      Berg Balance Test   Sit to Stand Able to stand  independently using hands    Standing Unsupported Able to stand safely 2 minutes    Sitting with Back Unsupported but Feet Supported on Floor or Stool Able to sit safely and securely 2 minutes    Stand to Sit Controls descent by using hands    Transfers Able to transfer safely, definite need of hands    Standing Unsupported with Eyes Closed Able to stand 10 seconds with supervision    Standing Unsupported with Feet Together Needs help to attain position but able to stand for 30 seconds with feet together    From Standing, Reach Forward with Outstretched Arm Can reach forward >12 cm safely (5")    From Standing Position, Pick up Object from Floor Able to pick up shoe, needs supervision    From  Standing Position, Turn to Look Behind Over each Shoulder Looks behind from both sides and weight shifts well    Turn 360 Degrees Needs close supervision or verbal cueing    Standing Unsupported, Alternately Place Feet on Step/Stool Able to complete 4 steps without aid or supervision    Standing Unsupported, One Foot in Front Able to take small step independently and hold 30 seconds    Standing on One Leg Tries to lift leg/unable to hold 3 seconds but remains standing independently  Total Score 37    Berg comment: 37/56 = high fall risk            -Initiated HEP.  See HEP section for details.   GAIT: Gait pattern: step through pattern, decreased arm swing- Right, decreased stride length, and decreased hip/knee flexion- Right Distance walked: 115' x3 Assistive device utilized: Single point cane, Quad cane small base, and rubber tip quad cane Level of assistance: SBA Comments: Single lap performed w/ each device option for assessment as pt is progressing walking tolerance well w/ current rollator.  Pt safest and most fluid with rubber tip quad cane of options trialed.  Discussed potentially making due with existing NBQC pt already owns, will reassess next session before recommending OOP purchase.   PATIENT EDUCATION: Education details: BERG interpretation and goal set.  Bring NBQC to next session.  Initial HEP safety. Person educated: Patient Education method: Explanation Education comprehension: verbalized understanding  HOME EXERCISE PROGRAM: Access Code: [email or web address] URL: [email or web address] Date: 10/05/2022 Prepared by: Camille Bal  Exercises - Corner Balance Feet Together With Eyes Open  - 1 x daily - 5 x weekly - 1 sets - 3 reps - 30 seconds hold - Standing Quarter Turn with Counter Support  - 1 x daily - 5 x weekly - 1 sets - 12-15 reps - Standing Single Leg Stance with Counter Support  - 1 x daily - 5 x weekly - 1 sets - 3 reps - 30 seconds hold -  Corner Balance Feet Together With Eyes Closed  - 1 x daily - 5 x weekly - 1 sets - 3 reps - 30 seconds hold  ASSESSMENT:  CLINICAL IMPRESSION: Assessed BERG this session with pt scoring 37/56 indicating high fall risk.  She had most difficulty with narrowed BOS, partially due to socket, SLS, and turning in a circle so HEP developed to address these areas.  Further assessed LRAD options w/ pt performing best w/ rubber tip quad cane, but she already owns a NBQC so instructed pt to bring to next session for further assessment prior to additional out-of-pocket purchase.  Will continue per POC.  OBJECTIVE IMPAIRMENTS: Abnormal gait, decreased balance, decreased mobility, difficulty walking, decreased strength, and postural dysfunction.   ACTIVITY LIMITATIONS: carrying, lifting, bending, squatting, stairs, transfers, and locomotion level  PARTICIPATION LIMITATIONS: community activity  PERSONAL FACTORS: Fitness, Past/current experiences, and 1-2 comorbidities: HTN and DM2  are also affecting patient's functional outcome.   REHAB POTENTIAL: Excellent  CLINICAL DECISION MAKING: Stable/uncomplicated  EVALUATION COMPLEXITY: Low   GOALS: Goals reviewed with patient? Yes  SHORT TERM GOALS: Target date: 10/27/2022  Pt will be independent with initial strength and balance HEP to maintain progress. Baseline:  To be established. Goal status: INITIAL  2.  Pt will increase BERG balance score to >/=42/56 to demonstrate improved static balance. Baseline: 37/56 (10/05/2022) Goal status: INITIAL  3.  Pt will demonstrate a gait speed of >/=2.77 feet/sec using LRAD in order to decrease risk for falls. Baseline: 2.37 ft/sec w/ rollator Goal status: INITIAL  4.  Pt will decrease 5xSTS to </=13 seconds w/o UE support in order to demonstrate decreased risk for falls and improved functional bilateral LE strength and power. Baseline: 15.82 sec w/ BUE support Goal status: INITIAL  LONG TERM GOALS: Target  date: 11/24/2022  Pt will be independent with advanced strength and balance HEP to maintain progress. Baseline: To be established. Goal status: INITIAL  2.  Pt will demonstrate a gait speed of >/=3.17 feet/sec using LRAD in  order to decrease risk for falls. Baseline: 2.37 ft/sec w/ rollator Goal status: INITIAL  3.  Pt will increase BERG balance score to >/=47/56 to demonstrate improved static balance. Baseline: 37/56 (10/05/2022) Goal status: INITIAL  PLAN:  PT FREQUENCY: 1x/week  PT DURATION: 8 weeks  PLANNED INTERVENTIONS: Therapeutic exercises, Therapeutic activity, Neuromuscular re-education, Balance training, Gait training, Patient/Family education, Self Care, Joint mobilization, Stair training, Vestibular training, Prosthetic training, DME instructions, Manual therapy, and Re-evaluation  PLAN FOR NEXT SESSION:  Modify HEP prn for strength and balance.  Work towards [employer] (pt's owned NBQC vs OOP rubber tip quad cane purchase vs current rollator) w/ gait training.  Leg press, treadmill training-incline, hip/quad strength, dynamic balance  Check all possible CPT codes: 97164 - PT Re-evaluation, 97110- Therapeutic Exercise, [phone]- Neuro Re-education, [phone] - Gait Training, [phone] - Manual Therapy, [phone] - Therapeutic Activities, [phone] - Self Care, and [phone] - Prosthetic training    Check all conditions that are expected to impact treatment: Diabetes mellitus   If treatment provided at initial evaluation, no treatment charged due to lack of authorization.    Bary Richard, PT, DPT 10/05/2022, 9:35 AM

## 2022-10-09 ENCOUNTER — Other Ambulatory Visit: Payer: Self-pay | Admitting: Nurse Practitioner

## 2022-10-09 DIAGNOSIS — I10 Essential (primary) hypertension: Secondary | ICD-10-CM

## 2022-10-11 ENCOUNTER — Encounter: Payer: Self-pay | Admitting: Physical Therapy

## 2022-10-11 ENCOUNTER — Ambulatory Visit: Payer: Medicaid Other | Admitting: Physical Therapy

## 2022-10-11 DIAGNOSIS — R2689 Other abnormalities of gait and mobility: Secondary | ICD-10-CM

## 2022-10-11 DIAGNOSIS — M6281 Muscle weakness (generalized): Secondary | ICD-10-CM

## 2022-10-11 DIAGNOSIS — R2681 Unsteadiness on feet: Secondary | ICD-10-CM

## 2022-10-11 NOTE — Patient Instructions (Signed)
-   Standing Tandem Balance with Counter Support  - 1 x daily - 5 x weekly - 1 sets - 3 reps - 30 seconds hold

## 2022-10-11 NOTE — Therapy (Signed)
OUTPATIENT PHYSICAL THERAPY PROSTHETICS TREATMENT   Patient Name: [name] MRN: [id number] DOB:07/11/1986, 37 y.o., female Today's Date: 10/11/2022  PCP: Daryll Drown, NP (pt states she is having to find a new provider as current one is leaving practice) REFERRING PROVIDER: Daryll Drown, NP  END OF SESSION:  PT End of Session - 10/11/22 0850     Visit Number 3    Number of Visits 9   8+eval   Date for PT Re-Evaluation 11/24/22    Authorization Type [place] MEDICAID HEALTHY BLUE    PT Start Time 0845    PT Stop Time 0931    PT Time Calculation (min) 46 min    Equipment Utilized During Treatment Gait belt    Activity Tolerance Patient tolerated treatment well    Behavior During Therapy WFL for tasks assessed/performed             Past Medical History:  Diagnosis Date   Chronic hypertension    12-16-2020  documented in epic since 2017 pt dx hypertension (not during pregnancy);  pt currently is not and has not been taking any bp med,  bp at [facility] 12-15-2020 165/ 104   Missed ab 12/15/2020   Type 2 diabetes mellitus (HCC)    12-16-2020  pt stated only during pregnancy took medication, at [facility] 12-15-2020 blood glucose 353 pt was given metformin bid and pt stated did start taking it   Wears glasses    Past Surgical History:  Procedure Laterality Date   BELOW KNEE LEG AMPUTATION Right    CESAREAN SECTION N/A 12/25/2018   Procedure: CESAREAN SECTION;  Surgeon: Levie Heritage, DO;  Location: [facility] LD ORS;  Service: Obstetrics;  Laterality: N/A;   TONSILLECTOMY AND ADENOIDECTOMY  age 33   Patient Active Problem List   Diagnosis Date Noted   High triglycerides 08/08/2022   Chronic pain of right knee 01/13/2022   Nausea 01/13/2022   Morbid obesity (HCC) 11/30/2021   Type 2 diabetes mellitus with hyperglycemia (HCC) 11/30/2021   Non-healing amputation site (HCC) 10/12/2021   Necrotizing fasciitis of ankle and foot (HCC) 10/05/2021   Hypokalemia 10/02/2021    Hypoalbuminemia 09/26/2021   Positive for macroalbuminuria 09/26/2021   Acute blood loss anemia 09/22/2021   ATN (acute tubular necrosis) (HCC) 09/22/2021   AKI (acute kidney injury) (HCC) 09/20/2021   Hyponatremia 09/19/2021   Diabetes mellitus type 2, noninsulin dependent (HCC) 09/19/2021   Chronic hypertension 12/16/2020   Missed abortion 12/16/2020   Status post cesarean delivery 12/15/2020   Establishing care with new doctor, encounter for 12/22/2018   Pap smear abnormality of cervix/human papillomavirus (HPV) positive 06/25/2018   BMI 33.0-33.9,adult 03/30/2016    ONSET DATE: 09/08/2022 (received prosthetic on 09/19/2022)  REFERRING DIAG: Z89.511 (ICD-10-CM) - Status post below-knee amputation of right lower extremity (HCC)  THERAPY DIAG:  Other abnormalities of gait and mobility  Muscle weakness (generalized)  Unsteadiness on feet  Rationale for Evaluation and Treatment: Rehabilitation  SUBJECTIVE:   SUBJECTIVE STATEMENT: Pt denies pain or falls.  She has a 3-ply sock on today.  Bobby increased the height of her existing prosthetic and turning the foot out to the right.  Pt feels this got rid of the sinking feeling from side-to-side when walking.  Pt accompanied by: family member-uncle drove her today, but pt states she has returned to driving.  PERTINENT HISTORY: HTN, DM2-on insulin  She is s/p right BKA in the setting of necrotizing fasciitis by Dr.Long on 10-12-2021.  Followed  by Dr. Earleen Newport at [facility].  She is followed by Mortimer Fries at [place] [facility].  PAIN:  Are you having pain? No  PRECAUTIONS: Fall and Other: pt has recently been hypoglycemic frequently  WEIGHT BEARING RESTRICTIONS: No  OBJECTIVE:   CURRENT PROSTHETIC WEAR ASSESSMENT: Patient is independent with: skin check, residual limb care, care of non-amputated limb, prosthetic cleaning, ply sock cleaning, correct ply sock adjustment, proper wear schedule/adjustment, and proper weight-bearing  schedule/adjustment Patient is dependent with:  none Donning prosthesis: Complete Independence Doffing prosthesis: Complete Independence Prosthetic wear tolerance: 6 hours/day, 7 days/week Prosthetic weight bearing tolerance: 12 minutes Edema: pt reports limb volume stable, wearing shrinker when prosthesis is off except when A&D ointment is on at night Residual limb condition: skin intact, no wounds, incision healed; pt has no pain or skin breakdown with current wear schedule. Prosthetic description: Patellar-tendon bearing prosthesis K code/activity level with prosthetic use: Level 3  PATIENT EDUCATED ON FOLLOWING PROSTHETIC CARE: Prosthetic wear tolerance: 6  hours, 7 days/week; continue to progress to all awake hours gradually, pt is self-progressing well based on skin tolerance. Prosthetic weight bearing tolerance: 10 minutes Other education  Skin check, Residual limb care, Proper wear schedule/adjustment, and Proper weight-bearing schedule/adjustment TODAY'S TREATMENT:                                                                                                                                         DATE:   -Blood sugar 116 when waking this morning. -Treadmill training x5 minutes at 1.1 mph for neural priming and dynamic warmup, min cues for preventing LE scuffing to improve bilateral clearance.  GAIT: Gait pattern: step through pattern, decreased arm swing- Right, decreased stride length, and decreased hip/knee flexion- Right Distance walked: 57' + 124' Assistive device utilized: Quad cane small base and None Level of assistance: SBA and CGA Comments: Pt ambulates w/ good cadence and sequencing w/ NBQC, but states she feels like she wants to just take off.  Trialed no AD today w/ notable fatigue at 100' w/ pt beginning to have narrowed BOS and some staggering w/ turns.    -4" hurdles w/ left NBQC 4x10' w/ cues to prevent circumduction of RLE -6" step taps progressing from LUE  support to none w/ intermittent CGA, pt has severe posterior LOB x1 requiring minA and BUE support to recover -STS from progressively lowered mat surface 3x10; intermittent hands-on-knees assist for last set from lowest surface (20.5") -Standing at counter progressing to fingertip support alt LE tandem 2x30 sec - added to HEP  PATIENT EDUCATION: Education details: Instructed pt in safety w/ using NBQC at home and expected fatigue w/ decreased hand support from AD.  Use rollator for longer or unlevel distances for now. Person educated: Patient Education method: Explanation Education comprehension: verbalized understanding  HOME EXERCISE PROGRAM: Access Code: [email or web address] URL: [email or web address] Date: 10/05/2022 Prepared by: Elease Etienne  Exercises - Corner Balance Feet  Together With Eyes Open  - 1 x daily - 5 x weekly - 1 sets - 3 reps - 30 seconds hold - Standing Quarter Turn with Counter Support  - 1 x daily - 5 x weekly - 1 sets - 12-15 reps - Standing Single Leg Stance with Counter Support  - 1 x daily - 5 x weekly - 1 sets - 3 reps - 30 seconds hold - Corner Balance Feet Together With Eyes Closed  - 1 x daily - 5 x weekly - 1 sets - 3 reps - 30 seconds hold - Standing Tandem Balance with Counter Support  - 1 x daily - 5 x weekly - 1 sets - 3 reps - 30 seconds hold  ASSESSMENT:  CLINICAL IMPRESSION: Continued to address gait training this session w/ NBQC vs none AD.  Pt is safest with rollator for longer distances due to notable gait deficits with fatigue, but manages short, level distances well w/ NBQC.  Further addressed management of obstacles using low hurdles and 6" step to practice foot clearance w/ pt improving right foot clearance with repetition.  She continues to benefit from skilled PT to address ongoing right weakness and safety w/ LRAD dynamically.  OBJECTIVE IMPAIRMENTS: Abnormal gait, decreased balance, decreased mobility, difficulty walking, decreased  strength, and postural dysfunction.   ACTIVITY LIMITATIONS: carrying, lifting, bending, squatting, stairs, transfers, and locomotion level  PARTICIPATION LIMITATIONS: community activity  PERSONAL FACTORS: Fitness, Past/current experiences, and 1-2 comorbidities: HTN and DM2  are also affecting patient's functional outcome.   REHAB POTENTIAL: Excellent  CLINICAL DECISION MAKING: Stable/uncomplicated  EVALUATION COMPLEXITY: Low   GOALS: Goals reviewed with patient? Yes  SHORT TERM GOALS: Target date: 10/27/2022  Pt will be independent with initial strength and balance HEP to maintain progress. Baseline:  To be established. Goal status: INITIAL  2.  Pt will increase BERG balance score to >/=42/56 to demonstrate improved static balance. Baseline: 37/56 (10/05/2022) Goal status: INITIAL  3.  Pt will demonstrate a gait speed of >/=2.77 feet/sec using LRAD in order to decrease risk for falls. Baseline: 2.37 ft/sec w/ rollator Goal status: INITIAL  4.  Pt will decrease 5xSTS to </=13 seconds w/o UE support in order to demonstrate decreased risk for falls and improved functional bilateral LE strength and power. Baseline: 15.82 sec w/ BUE support Goal status: INITIAL  LONG TERM GOALS: Target date: 11/24/2022  Pt will be independent with advanced strength and balance HEP to maintain progress. Baseline: To be established. Goal status: INITIAL  2.  Pt will demonstrate a gait speed of >/=3.17 feet/sec using LRAD in order to decrease risk for falls. Baseline: 2.37 ft/sec w/ rollator Goal status: INITIAL  3.  Pt will increase BERG balance score to >/=47/56 to demonstrate improved static balance. Baseline: 37/56 (10/05/2022) Goal status: INITIAL  PLAN:  PT FREQUENCY: 1x/week  PT DURATION: 8 weeks  PLANNED INTERVENTIONS: Therapeutic exercises, Therapeutic activity, Neuromuscular re-education, Balance training, Gait training, Patient/Family education, Self Care, Joint mobilization,  Stair training, Vestibular training, Prosthetic training, DME instructions, Manual therapy, and Re-evaluation  PLAN FOR NEXT SESSION:  Modify HEP prn for strength and balance.  Work towards LRAD (pt's own NBQC vs none) w/ gait training.  Leg press, treadmill training-incline, hip/quad strength, dynamic balance, general endurance  Check all possible CPT codes: 53976 - PT Re-evaluation, 97110- Therapeutic Exercise, [phone]- Neuro Re-education, [phone] - Gait Training, [phone] - Manual Therapy, [phone] - Therapeutic Activities, [phone] - Self Care, and [phone] - Prosthetic training  Check all conditions that are expected to impact treatment: Diabetes mellitus   If treatment provided at initial evaluation, no treatment charged due to lack of authorization.    Sadie Haber, PT, DPT 10/11/2022, 9:59 AM

## 2022-10-18 ENCOUNTER — Ambulatory Visit: Payer: Medicaid Other | Admitting: Physical Therapy

## 2022-10-18 ENCOUNTER — Encounter: Payer: Self-pay | Admitting: Physical Therapy

## 2022-10-18 DIAGNOSIS — R2681 Unsteadiness on feet: Secondary | ICD-10-CM

## 2022-10-18 DIAGNOSIS — M6281 Muscle weakness (generalized): Secondary | ICD-10-CM

## 2022-10-18 DIAGNOSIS — R2689 Other abnormalities of gait and mobility: Secondary | ICD-10-CM

## 2022-10-18 NOTE — Therapy (Signed)
OUTPATIENT PHYSICAL THERAPY PROSTHETICS TREATMENT   Patient Name: [name] MRN: [id number] DOB:[birth year], 37 y.o., female Today's Date: 10/18/2022  PCP: Daryll Drown, NP (pt states she is having to find a new provider as current one is leaving practice) REFERRING PROVIDER: Daryll Drown, NP  END OF SESSION:  PT End of Session - 10/18/22 0849     Visit Number 4    Number of Visits 9   8+eval   Date for PT Re-Evaluation 11/24/22    Authorization Type [place] MEDICAID HEALTHY BLUE    PT Start Time [phone]    PT Stop Time 0930    PT Time Calculation (min) 43 min    Equipment Utilized During Treatment Gait belt    Activity Tolerance Patient tolerated treatment well    Behavior During Therapy WFL for tasks assessed/performed             Past Medical History:  Diagnosis Date   Chronic hypertension    12-16-2020  documented in epic since 2017 pt dx hypertension (not during pregnancy);  pt currently is not and has not been taking any bp med,  bp at [facility] 12-15-2020 165/ 104   Missed ab 12/15/2020   Type 2 diabetes mellitus (HCC)    12-16-2020  pt stated only during pregnancy took medication, at [facility] 12-15-2020 blood glucose 353 pt was given metformin bid and pt stated did start taking it   Wears glasses    Past Surgical History:  Procedure Laterality Date   BELOW KNEE LEG AMPUTATION Right    CESAREAN SECTION N/A 12/25/2018   Procedure: CESAREAN SECTION;  Surgeon: Levie Heritage, DO;  Location: [facility] LD ORS;  Service: Obstetrics;  Laterality: N/A;   TONSILLECTOMY AND ADENOIDECTOMY  age 33   Patient Active Problem List   Diagnosis Date Noted   High triglycerides 08/08/2022   Chronic pain of right knee 01/13/2022   Nausea 01/13/2022   Morbid obesity (HCC) 11/30/2021   Type 2 diabetes mellitus with hyperglycemia (HCC) 11/30/2021   Non-healing amputation site (HCC) 10/12/2021   Necrotizing fasciitis of ankle and foot (HCC) 10/05/2021   Hypokalemia 10/02/2021    Hypoalbuminemia 09/26/2021   Positive for macroalbuminuria 09/26/2021   Acute blood loss anemia 09/22/2021   ATN (acute tubular necrosis) (HCC) 09/22/2021   AKI (acute kidney injury) (HCC) 09/20/2021   Hyponatremia 09/19/2021   Diabetes mellitus type 2, noninsulin dependent (HCC) 09/19/2021   Chronic hypertension 12/16/2020   Missed abortion 12/16/2020   Status post cesarean delivery 12/15/2020   Establishing care with new doctor, encounter for 12/22/2018   Pap smear abnormality of cervix/human papillomavirus (HPV) positive 06/25/2018   BMI 33.0-33.9,adult 03/30/2016    ONSET DATE: 09/08/2022 (received prosthetic on 09/19/2022)  REFERRING DIAG: Z89.511 (ICD-10-CM) - Status post below-knee amputation of right lower extremity (HCC)  THERAPY DIAG:  Other abnormalities of gait and mobility  Unsteadiness on feet  Muscle weakness (generalized)  Rationale for Evaluation and Treatment: Rehabilitation  SUBJECTIVE:   SUBJECTIVE STATEMENT: She presents with prosthetic donned and ambulates into clinic w/ rollator.  Pt denies pain or falls.  She has a 3-ply sock on today.  Socket continues to feel fine since adjustment and feels like the adjustment is working.  She denies any skin changes or issues, further stating "no redness or sore spots".  Pt accompanied by: family member-uncle drove her today, but pt states she has returned to driving.  PERTINENT HISTORY: HTN, DM2-on insulin  She is s/p right BKA in  the setting of necrotizing fasciitis by Dr.Long on 10-12-2021.  Followed by Dr. Earleen Newport at [facility].  She is followed by Mortimer Fries at [facility].  PAIN:  Are you having pain? No  PRECAUTIONS: Fall and Other: pt has recently been hypoglycemic frequently  WEIGHT BEARING RESTRICTIONS: No  OBJECTIVE:   CURRENT PROSTHETIC WEAR ASSESSMENT: Patient is independent with: skin check, residual limb care, care of non-amputated limb, prosthetic cleaning, ply sock cleaning, correct ply sock  adjustment, proper wear schedule/adjustment, and proper weight-bearing schedule/adjustment Patient is dependent with:  none Donning prosthesis: Complete Independence Doffing prosthesis: Complete Independence Prosthetic wear tolerance: 6-7 hours/day, 7 days/week Prosthetic weight bearing tolerance: 12 minutes Edema: pt reports limb volume stable, wearing shrinker when prosthesis is off except when A&D ointment is on at night Residual limb condition: skin intact, no wounds, incision healed; pt has no pain or skin breakdown with current wear schedule. Prosthetic description: Patellar-tendon bearing prosthesis K code/activity level with prosthetic use: Level 3  PATIENT EDUCATED ON FOLLOWING PROSTHETIC CARE: Prosthetic wear tolerance: 6  hours, 7 days/week; continue to progress to all awake hours gradually, pt is self-progressing well based on skin tolerance. Prosthetic weight bearing tolerance: 10 minutes Other education  Skin check, Residual limb care, Proper wear schedule/adjustment, and Proper weight-bearing schedule/adjustment  TODAY'S TREATMENT:                                                                                                                                         DATE:   -Same from prior:  Blood sugar 116 when waking this morning.  She states her fasting blood sugar has been stable the past week or so.  STAIRS:  Level of Assistance: SBA  Stair Negotiation Technique: Step to Pattern Forwards with Single Rail on Left  Number of Stairs: 8   Height of Stairs: 6"  Comments: Pt cued to go up with the left LE and down with the RLE, good BLE clearance.  -Treadmill training x6 minutes at 1.2 mph progressed to 2% incline for cardiovascular endurance and improved hip flexor engagement and foot placement, min cues for preventing appropriate placement and use of proprioceptive input to improve width of BOS.  GAIT: Gait pattern: step through pattern, decreased arm swing- Right,  decreased stride length, and decreased hip/knee flexion- Right Distance walked: 175' + 80' Assistive device utilized: Lobbyist and None Level of assistance: SBA and CGA Comments: Pt ambulates w/ good cadence and sequencing w/ NBQC, beginning to out pace the AD.  Trialed no AD again this visit w/ pt hitting prosthetic foot on LLE causing significant forward stumble and CGA-minA from therapist to prevent fall.  Discussed widened BOS on recovery with pt maintaining for duration of 80' task, did not recommend going without an AD at this time.  Following all gait tasks pt reporting mild low back discomfort (states this always happens lately) and requests activities  to address this at home.  -Supine bridges x12 -Lower trunk rotations x10 each direction -SKTC x30 sec each side, pt reports minimal stretch -Supine piriformis stretch (manual on the RLE due to prosthetic positioning difficulties) x30 sec each LE, tightness noted in thighs -Lumbar rollouts w/ physioball x15 forward, left, and right w/ good stretch noted  PATIENT EDUCATION: Education details: Instructed pt in safety w/ using NBQC at home and expected fatigue w/ decreased hand support from AD.  Use rollator for longer or unlevel distances for now. Person educated: Patient Education method: Explanation Education comprehension: verbalized understanding  HOME EXERCISE PROGRAM: Access Code: [email or web address] URL: [email or web address] Date: 10/18/2022 Prepared by: Elease Etienne  Exercises - Corner Balance Feet Together With Eyes Open  - 1 x daily - 5 x weekly - 1 sets - 3 reps - 30 seconds hold - Standing Quarter Turn with Counter Support  - 1 x daily - 5 x weekly - 1 sets - 12-15 reps - Standing Single Leg Stance with Counter Support  - 1 x daily - 5 x weekly - 1 sets - 3 reps - 30 seconds hold - Corner Balance Feet Together With Eyes Closed  - 1 x daily - 5 x weekly - 1 sets - 3 reps - 30 seconds hold - Standing  Tandem Balance with Counter Support  - 1 x daily - 5 x weekly - 1 sets - 3 reps - 30 seconds hold - Supine Bridge  - 1 x daily - 5 x weekly - 2 sets - 10 reps - Supine Lower Trunk Rotation  - 1 x daily - 5 x weekly - 2 sets - 10 reps - Seated Lumbar Flexion Stretch  - 1 x daily - 5 x weekly - 1 sets - 10 reps - 3 seconds hold  ASSESSMENT:  CLINICAL IMPRESSION: Focus of skilled session today on safety and width of BOS with NBQC.  Pt still tolerating most distance with maintained stability using rollator, but is progressing safety and mechanics w/ NBQC which she has been trialing in home with success.  Her endurance remains limited as well as some noted low back discomfort with upright tasks likely due to ongoing weakness and posture in standing.  Time spent addressing this today and will continue to address in addition to ongoing skilled PT POC.  OBJECTIVE IMPAIRMENTS: Abnormal gait, decreased balance, decreased mobility, difficulty walking, decreased strength, and postural dysfunction.   ACTIVITY LIMITATIONS: carrying, lifting, bending, squatting, stairs, transfers, and locomotion level  PARTICIPATION LIMITATIONS: community activity  PERSONAL FACTORS: Fitness, Past/current experiences, and 1-2 comorbidities: HTN and DM2  are also affecting patient's functional outcome.   REHAB POTENTIAL: Excellent  CLINICAL DECISION MAKING: Stable/uncomplicated  EVALUATION COMPLEXITY: Low   GOALS: Goals reviewed with patient? Yes  SHORT TERM GOALS: Target date: 10/27/2022  Pt will be independent with initial strength and balance HEP to maintain progress. Baseline:  To be established. Goal status: INITIAL  2.  Pt will increase BERG balance score to >/=42/56 to demonstrate improved static balance. Baseline: 37/56 (10/05/2022) Goal status: INITIAL  3.  Pt will demonstrate a gait speed of >/=2.77 feet/sec using LRAD in order to decrease risk for falls. Baseline: 2.37 ft/sec w/ rollator Goal status:  INITIAL  4.  Pt will decrease 5xSTS to </=13 seconds w/o UE support in order to demonstrate decreased risk for falls and improved functional bilateral LE strength and power. Baseline: 15.82 sec w/ BUE support Goal status: INITIAL  LONG TERM GOALS: Target date: 11/24/2022  Pt will be independent with advanced strength and balance HEP to maintain progress. Baseline: To be established. Goal status: INITIAL  2.  Pt will demonstrate a gait speed of >/=3.17 feet/sec using LRAD in order to decrease risk for falls. Baseline: 2.37 ft/sec w/ rollator Goal status: INITIAL  3.  Pt will increase BERG balance score to >/=47/56 to demonstrate improved static balance. Baseline: 37/56 (10/05/2022) Goal status: INITIAL  PLAN:  PT FREQUENCY: 1x/week  PT DURATION: 8 weeks  PLANNED INTERVENTIONS: Therapeutic exercises, Therapeutic activity, Neuromuscular re-education, Balance training, Gait training, Patient/Family education, Self Care, Joint mobilization, Stair training, Vestibular training, Prosthetic training, DME instructions, Manual therapy, and Re-evaluation  PLAN FOR NEXT SESSION:  ASSESS STGs!  How is LBP?  Walking w/ NBQC?  Modify HEP prn for strength and balance.  Work towards LRAD (pt's own NBQC vs none) w/ gait training.  Leg press, treadmill training-incline, hip/quad strength, dynamic balance, general endurance, hamstring stretch-add to HEP  Check all possible CPT codes: 01093 - PT Re-evaluation, 97110- Therapeutic Exercise, [phone]- Neuro Re-education, [phone] - Gait Training, [phone] - Manual Therapy, [phone] - Therapeutic Activities, [phone] - Self Care, and [phone] - Prosthetic training    Check all conditions that are expected to impact treatment: Diabetes mellitus   If treatment provided at initial evaluation, no treatment charged due to lack of authorization.    Sadie Haber, PT, DPT 10/18/2022, 10:57 AM

## 2022-10-18 NOTE — Patient Instructions (Signed)
-  Supine Bridge  - 1 x daily - 5 x weekly - 2 sets - 10 reps - Supine Lower Trunk Rotation  - 1 x daily - 5 x weekly - 2 sets - 10 reps - Seated Lumbar Flexion Stretch  - 1 x daily - 5 x weekly - 1 sets - 10 reps - 3 seconds hold

## 2022-10-25 ENCOUNTER — Encounter: Payer: Self-pay | Admitting: Physical Therapy

## 2022-10-25 ENCOUNTER — Ambulatory Visit: Payer: Medicaid Other | Admitting: Physical Therapy

## 2022-10-25 DIAGNOSIS — R2689 Other abnormalities of gait and mobility: Secondary | ICD-10-CM | POA: Diagnosis not present

## 2022-10-25 DIAGNOSIS — M6281 Muscle weakness (generalized): Secondary | ICD-10-CM

## 2022-10-25 DIAGNOSIS — R2681 Unsteadiness on feet: Secondary | ICD-10-CM

## 2022-10-25 NOTE — Therapy (Signed)
OUTPATIENT PHYSICAL THERAPY PROSTHETICS TREATMENT   Patient Name: [name] MRN: [id number] DOB:[birth year], 37 y.o., female Today's Date: 10/25/2022  PCP: Daryll Drown, NP (pt states she is having to find a new provider as current one is leaving practice) REFERRING PROVIDER: Daryll Drown, NP  END OF SESSION:  PT End of Session - 10/25/22 0852     Visit Number 5    Number of Visits 9   8+eval   Date for PT Re-Evaluation 11/24/22    Authorization Type [place] MEDICAID HEALTHY BLUE    PT Start Time 0850    PT Stop Time 0933    PT Time Calculation (min) 43 min    Equipment Utilized During Treatment Gait belt    Activity Tolerance Patient tolerated treatment well    Behavior During Therapy WFL for tasks assessed/performed             Past Medical History:  Diagnosis Date   Chronic hypertension    12-16-2020  documented in epic since 2017 pt dx hypertension (not during pregnancy);  pt currently is not and has not been taking any bp med,  bp at [facility] 12-15-2020 165/ 104   Missed ab 12/15/2020   Type 2 diabetes mellitus (HCC)    12-16-2020  pt stated only during pregnancy took medication, at [facility] 12-15-2020 blood glucose 353 pt was given metformin bid and pt stated did start taking it   Wears glasses    Past Surgical History:  Procedure Laterality Date   BELOW KNEE LEG AMPUTATION Right    CESAREAN SECTION N/A 12/25/2018   Procedure: CESAREAN SECTION;  Surgeon: Levie Heritage, DO;  Location: [facility] LD ORS;  Service: Obstetrics;  Laterality: N/A;   TONSILLECTOMY AND ADENOIDECTOMY  age 42   Patient Active Problem List   Diagnosis Date Noted   High triglycerides 08/08/2022   Chronic pain of right knee 01/13/2022   Nausea 01/13/2022   Morbid obesity (HCC) 11/30/2021   Type 2 diabetes mellitus with hyperglycemia (HCC) 11/30/2021   Non-healing amputation site (HCC) 10/12/2021   Necrotizing fasciitis of ankle and foot (HCC) 10/05/2021   Hypokalemia 10/02/2021    Hypoalbuminemia 09/26/2021   Positive for macroalbuminuria 09/26/2021   Acute blood loss anemia 09/22/2021   ATN (acute tubular necrosis) (HCC) 09/22/2021   AKI (acute kidney injury) (HCC) 09/20/2021   Hyponatremia 09/19/2021   Diabetes mellitus type 2, noninsulin dependent (HCC) 09/19/2021   Chronic hypertension 12/16/2020   Missed abortion 12/16/2020   Status post cesarean delivery 12/15/2020   Establishing care with new doctor, encounter for 12/22/2018   Pap smear abnormality of cervix/human papillomavirus (HPV) positive 06/25/2018   BMI 33.0-33.9,adult 03/30/2016    ONSET DATE: 09/08/2022 (received prosthetic on 09/19/2022)  REFERRING DIAG: Z89.511 (ICD-10-CM) - Status post below-knee amputation of right lower extremity (HCC)  THERAPY DIAG:  Other abnormalities of gait and mobility  Unsteadiness on feet  Muscle weakness (generalized)  Rationale for Evaluation and Treatment: Rehabilitation  SUBJECTIVE:   SUBJECTIVE STATEMENT: She presents with prosthetic donned and ambulates into clinic w/ NBQC.  She denies fall or other issues, but has been wearing 5 ply sock the past 2 days.  She has been regularly walking w/ NBQC.  She has been able to lift son into grocery cart and walk across a large store pushing cart, but it was a workout.  She has been doing lower trunk rotations for ongoing low back pain/fatigue with good relief.  Pt accompanied by: family member-uncle drove her  today, but pt states she has returned to driving.  PERTINENT HISTORY: HTN, DM2-on insulin  She is s/p right BKA in the setting of necrotizing fasciitis by Dr.Long on 10-12-2021.  Followed by Dr. Loreta Ave at [facility].  She is followed by Reita Cliche at [facility].  PAIN:  Are you having pain? No  PRECAUTIONS: Fall and Other: pt has recently been hypoglycemic frequently  WEIGHT BEARING RESTRICTIONS: No  OBJECTIVE:   CURRENT PROSTHETIC WEAR ASSESSMENT: Patient is independent with: skin check, residual limb  care, care of non-amputated limb, prosthetic cleaning, ply sock cleaning, correct ply sock adjustment, proper wear schedule/adjustment, and proper weight-bearing schedule/adjustment Patient is dependent with:  none Donning prosthesis: Complete Independence Doffing prosthesis: Complete Independence Prosthetic wear tolerance: all awake hours/day, 7 days/week Prosthetic weight bearing tolerance: 12 minutes Edema: pt reports limb volume stable, wearing shrinker when prosthesis is off except when A&D ointment is on at night Residual limb condition: skin intact, no wounds, incision healed; pt has no pain or skin breakdown with current wear schedule. Prosthetic description: Patellar-tendon bearing prosthesis K code/activity level with prosthetic use: Level 3  PATIENT EDUCATED ON FOLLOWING PROSTHETIC CARE: Prosthetic wear tolerance: all awake hours Prosthetic weight bearing tolerance: Progress towards 15 minutes Other education  Skin check, Residual limb care, Proper wear schedule/adjustment, and Proper weight-bearing schedule/adjustment  TODAY'S TREATMENT:                                                                                                                                         DATE:   -Blood sugar 86 when waking this morning, she has eaten and feels good at current.  -Verbally reviewed HEP and exercises remain challenging and helpful for balance and low back pain.  Pt inquires about why wide BOS is easier than narrow.  She further inquires about how many ply are too many.  Provided sock edu and socket fit guidelines. -Treadmill training x8 minutes at 1.0 mph progressed to 4% incline for cardiovascular endurance and improved hip flexor engagement and foot placement, min cues for width of BOS prior to overground training. -PT adjusts patient's personal cane to height as height she ambulated in with cane at too elevated of a height.  GAIT: Gait pattern: step through pattern, decreased arm  swing- Right, decreased stride length, and decreased hip/knee flexion- Right Distance walked: 220' Assistive device utilized: Quad cane small base Level of assistance: SBA Comments: Pt ambulates w/ good cadence and sequencing w/ NBQC, not outpacing the cane today.  Pt maintains RLE in appropriate BOS width from left.  She demonstrates improved step length of the left LE and increased stance time of the RLE today.  - w/ NBQC (pt intermittently picks up the cane):  9.97 sec = 1.00 m/sec OR 3.31 ft/sec -5xSTS:  11.25 sec w/ hands-on-knees  PATIENT EDUCATION: Education details: Utilize standing rest breaks as needed w/ larger ambulatory tasks when using  NBQC as pt continues to have some fatigue w/ longer distances.   Person educated: Patient Education method: Explanation Education comprehension: verbalized understanding  HOME EXERCISE PROGRAM: Access Code: [email or web address] URL: [email or web address] Date: 10/18/2022 Prepared by: Elease Etienne  Exercises - Corner Balance Feet Together With Eyes Open  - 1 x daily - 5 x weekly - 1 sets - 3 reps - 30 seconds hold - Standing Quarter Turn with Counter Support  - 1 x daily - 5 x weekly - 1 sets - 12-15 reps - Standing Single Leg Stance with Counter Support  - 1 x daily - 5 x weekly - 1 sets - 3 reps - 30 seconds hold - Corner Balance Feet Together With Eyes Closed  - 1 x daily - 5 x weekly - 1 sets - 3 reps - 30 seconds hold - Standing Tandem Balance with Counter Support  - 1 x daily - 5 x weekly - 1 sets - 3 reps - 30 seconds hold - Supine Bridge  - 1 x daily - 5 x weekly - 2 sets - 10 reps - Supine Lower Trunk Rotation  - 1 x daily - 5 x weekly - 2 sets - 10 reps - Seated Lumbar Flexion Stretch  - 1 x daily - 5 x weekly - 1 sets - 10 reps - 3 seconds hold  ASSESSMENT:  CLINICAL IMPRESSION: Started assessing STGs this session with pt compliant to HEP which remains appropriately challenging.  She is able to ambulated at a speed  of greater than 3.0 ft/sec using NBQC and even intermittently lifting the cane to ambulate during testing without issue.  She continues to need light UE support when standing and sitting due to ongoing functional weakness in LE, but is much improved in her time of completion of 5xSTS to 11.25 seconds which is WNL.  Will finish addressing STGs next session.  OBJECTIVE IMPAIRMENTS: Abnormal gait, decreased balance, decreased mobility, difficulty walking, decreased strength, and postural dysfunction.   ACTIVITY LIMITATIONS: carrying, lifting, bending, squatting, stairs, transfers, and locomotion level  PARTICIPATION LIMITATIONS: community activity  PERSONAL FACTORS: Fitness, Past/current experiences, and 1-2 comorbidities: HTN and DM2  are also affecting patient's functional outcome.   REHAB POTENTIAL: Excellent  CLINICAL DECISION MAKING: Stable/uncomplicated  EVALUATION COMPLEXITY: Low   GOALS: Goals reviewed with patient? Yes  SHORT TERM GOALS: Target date: 10/27/2022  Pt will be independent with initial strength and balance HEP to maintain progress. Baseline:  To be established.  Established and pt compliant (1/24) Goal status: MET  2.  Pt will increase BERG balance score to >/=42/56 to demonstrate improved static balance. Baseline: 37/56 (10/05/2022) Goal status: INITIAL  3.  Pt will demonstrate a gait speed of >/=2.77 feet/sec using LRAD in order to decrease risk for falls. Baseline: 2.37 ft/sec w/ rollator; 3.31 ft/sec using NBQC (intermittently nothing-1/24) Goal status: MET  4.  Pt will decrease 5xSTS to </=13 seconds w/o UE support in order to demonstrate decreased risk for falls and improved functional bilateral LE strength and power. Baseline: 15.82 sec w/ BUE support; 11.25 sec w/ hands-on-knees (1/24) Goal status: IN PROGRESS  LONG TERM GOALS: Target date: 11/24/2022  Pt will be independent with advanced strength and balance HEP to maintain progress. Baseline: To be  established. Goal status: INITIAL  2.  Pt will demonstrate a gait speed of >/=3.3 feet/sec using no AD in order to decrease risk for falls. Baseline: 2.37 ft/sec w/ rollator; 3.31 ft/sec w/ NBQC (intermittently nothing-1/24) Goal status: REVISED  3.  Pt will increase BERG balance score to >/=47/56 to demonstrate improved static balance. Baseline: 37/56 (10/05/2022) Goal status: INITIAL  PLAN:  PT FREQUENCY: 1x/week  PT DURATION: 8 weeks  PLANNED INTERVENTIONS: Therapeutic exercises, Therapeutic activity, Neuromuscular re-education, Balance training, Gait training, Patient/Family education, Self Care, Joint mobilization, Stair training, Vestibular training, Prosthetic training, DME instructions, Manual therapy, and Re-evaluation  PLAN FOR NEXT SESSION:  Finish STGs!  Modify HEP prn for strength and balance.  Work towards LRAD (pt's own NBQC vs none) w/ gait training.  Leg press, treadmill training-incline, hip/quad strength, dynamic balance, general endurance, hamstring stretch-add to HEP  Check all possible CPT codes: 24580 - PT Re-evaluation, 97110- Therapeutic Exercise, [phone]- Neuro Re-education, [phone] - Gait Training, [phone] - Manual Therapy, [phone] - Therapeutic Activities, [phone] - Self Care, and [phone] - Prosthetic training    Check all conditions that are expected to impact treatment: Diabetes mellitus   If treatment provided at initial evaluation, no treatment charged due to lack of authorization.    Bary Richard, PT, DPT 10/25/2022, 9:43 AM

## 2022-11-02 ENCOUNTER — Ambulatory Visit: Payer: Medicaid Other | Attending: Nurse Practitioner | Admitting: Physical Therapy

## 2022-11-02 ENCOUNTER — Encounter: Payer: Self-pay | Admitting: Physical Therapy

## 2022-11-02 DIAGNOSIS — M6281 Muscle weakness (generalized): Secondary | ICD-10-CM | POA: Diagnosis present

## 2022-11-02 DIAGNOSIS — R2689 Other abnormalities of gait and mobility: Secondary | ICD-10-CM

## 2022-11-02 DIAGNOSIS — R2681 Unsteadiness on feet: Secondary | ICD-10-CM

## 2022-11-02 NOTE — Therapy (Addendum)
OUTPATIENT PHYSICAL THERAPY PROSTHETICS TREATMENT   Patient Name: [name] MRN: [id number] DOB:Aug 03, 1986, 37 y.o., female Today's Date: 11/02/2022  PCP: Ivy Lynn, NP (pt states she is having to find a new provider as current one is leaving practice) REFERRING PROVIDER: Ivy Lynn, NP  END OF SESSION:  PT End of Session - 11/02/22 0850     Visit Number 6    Number of Visits 9   8+eval   Date for PT Re-Evaluation 11/24/22    Authorization Type [place] MEDICAID HEALTHY BLUE    PT Start Time [phone]    PT Stop Time 0934    PT Time Calculation (min) 48 min    Equipment Utilized During Treatment Gait belt    Activity Tolerance Patient tolerated treatment well    Behavior During Therapy [facility] for tasks assessed/performed             Past Medical History:  Diagnosis Date   Chronic hypertension    12-16-2020  documented in epic since 2017 pt dx hypertension (not during pregnancy);  pt currently is not and has not been taking any bp med,  bp at [facility] 12-15-2020 165/ 104   Missed ab 12/15/2020   Type 2 diabetes mellitus ([place])    12-16-2020  pt stated only during pregnancy took medication, at [facility] 12-15-2020 blood glucose 353 pt was given metformin bid and pt stated did start taking it   Wears glasses    Past Surgical History:  Procedure Laterality Date   BELOW KNEE LEG AMPUTATION Right    CESAREAN SECTION N/A 12/25/2018   Procedure: CESAREAN SECTION;  Surgeon: [name], DO;  Location: [facility] LD ORS;  Service: Obstetrics;  Laterality: N/A;   TONSILLECTOMY AND ADENOIDECTOMY  age 66   Patient Active Problem List   Diagnosis Date Noted   High triglycerides 08/08/2022   Chronic pain of right knee 01/13/2022   Nausea 01/13/2022   Morbid obesity ([place]) 11/30/2021   Type 2 diabetes mellitus with hyperglycemia ([place]) 11/30/2021   Non-healing amputation site ([place]) 10/12/2021   Necrotizing fasciitis of ankle and foot ([place]) 10/05/2021   Hypokalemia 10/02/2021    Hypoalbuminemia 09/26/2021   Positive for macroalbuminuria 09/26/2021   Acute blood loss anemia 09/22/2021   ATN (acute tubular necrosis) ([place]) 09/22/2021   AKI (acute kidney injury) ([place]) 09/20/2021   Hyponatremia 09/19/2021   Diabetes mellitus type 2, noninsulin dependent ([place]) 09/19/2021   Chronic hypertension 12/16/2020   Missed abortion 12/16/2020   Status post cesarean delivery 12/15/2020   Establishing care with new doctor, encounter for 12/22/2018   Pap smear abnormality of cervix/human papillomavirus (HPV) positive 06/25/2018   BMI 33.0-33.9,adult 03/30/2016    ONSET DATE: 09/08/2022 (received prosthetic on 09/19/2022)  REFERRING DIAG: Z89.511 (ICD-10-CM) - Status post below-knee amputation of right lower extremity (HCC)  THERAPY DIAG:  Other abnormalities of gait and mobility  Unsteadiness on feet  Muscle weakness (generalized)  Rationale for Evaluation and Treatment: Rehabilitation  SUBJECTIVE:   SUBJECTIVE STATEMENT: She presents with prosthetic donned and ambulates into clinic holding NBQC, but not using.  She denies falls or other issues stating she has not been using the cane at home.  She is consistently wearing a 5 ply sock. Her low back pain has been resolving since last visit.  She drove herself to visit today and has been driving recently.  PERTINENT HISTORY: HTN, DM2-on insulin  She is s/p right BKA in the setting of necrotizing fasciitis by Dr.Long on 10-12-2021.  Followed by Dr. Loreta Ave at [facility].  She is followed by Reita Cliche at [facility].  PAIN:  Are you having pain? No  PRECAUTIONS: Fall and Other: pt has recently been hypoglycemic frequently  WEIGHT BEARING RESTRICTIONS: No  OBJECTIVE:   CURRENT PROSTHETIC WEAR ASSESSMENT: Patient is independent with: skin check, residual limb care, care of non-amputated limb, prosthetic cleaning, ply sock cleaning, correct ply sock adjustment, proper wear schedule/adjustment, and proper weight-bearing  schedule/adjustment Patient is dependent with:  none Donning prosthesis: Complete Independence Doffing prosthesis: Complete Independence Prosthetic wear tolerance: all awake hours/day, 7 days/week Prosthetic weight bearing tolerance: 12 minutes Edema: pt reports limb volume stable, wearing shrinker when prosthesis is off except when A&D ointment is on at night Residual limb condition: skin intact, no wounds, incision healed; pt has no pain or skin breakdown with current wear schedule. Prosthetic description: Patellar-tendon bearing prosthesis K code/activity level with prosthetic use: Level 3  PATIENT EDUCATED ON FOLLOWING PROSTHETIC CARE: Prosthetic wear tolerance: all awake hours Prosthetic weight bearing tolerance: Progress towards 15 minutes Other education  Skin check, Residual limb care, Proper wear schedule/adjustment, and Proper weight-bearing schedule/adjustment  TODAY'S TREATMENT:                                                                                                                                         DATE:  -Blood sugar 93 when waking this morning, she has eaten and feels good at current.  -BERG:  [facility] PT Assessment - 11/02/22 0854       Berg Balance Test   Sit to Stand Able to stand without using hands and stabilize independently    Standing Unsupported Able to stand safely 2 minutes    Sitting with Back Unsupported but Feet Supported on Floor or Stool Able to sit safely and securely 2 minutes    Stand to Sit Sits safely with minimal use of hands    Transfers Able to transfer safely, definite need of hands   x2 attempts w/ severe LOB posteriorly midway into standing   Standing Unsupported with Eyes Closed Able to stand 10 seconds safely    Standing Unsupported with Feet Together Able to place feet together independently and stand for 1 minute with supervision    From Standing, Reach Forward with Outstretched Arm Can reach forward >12 cm safely (5")   wide BOS    From Standing Position, Pick up Object from Floor Able to pick up shoe, needs supervision   Wide BOS   From Standing Position, Turn to Look Behind Over each Shoulder Looks behind from both sides and weight shifts well    Turn 360 Degrees Able to turn 360 degrees safely but slowly    Standing Unsupported, Alternately Place Feet on Step/Stool Able to stand independently and safely and complete 8 steps in 20 seconds    Standing Unsupported, One Foot in Front Able to take small step  independently and hold 30 seconds    Standing on One Leg Tries to lift leg/unable to hold 3 seconds but remains standing independently    Total Score 45    Berg comment: 45/56 = significant fall risk and use of cane indoors            -Treadmill training for CV endurance x76mins progressing to 1.30mph over 2 minute warmup and returning to 1.0 mph on 1 minute cooldown, gait mechanics much improved this visit w/ only minor cues for improved right weight shift and prolonged stance time on RLE w/ fatigue at increased time.  GAIT: Gait pattern: step through pattern, decreased arm swing- Right, decreased stride length, and decreased hip/knee flexion- Right Distance walked: 400' Assistive device utilized: None Level of assistance: SBA Comments:  Pt maintains RLE in appropriate BOS width from left.  She demonstrates good carryover of cuing from treadmill training w/ similar fatigue issue causing decreased stance on the RLE w/ inc distance.  No LOB.  On instance of pt visualizing feet and not environment resulting in poor motor planning navigating left oriented obstacle, but no imbalance noted during correction.  -4-square stepping, reverse stepping is most difficult, progressed from supported to unsupported -Retro stepping no UE support 2x25' > lateral stepping 2x25' both directions, SBA  Pt requires seated rest throughout.  PATIENT EDUCATION: Education details: Utilize standing rest breaks as needed w/ larger  ambulatory tasks when using NBQC as pt continues to have some fatigue w/ longer distances (referenced BERG score from today as well).   Person educated: Patient Education method: Explanation Education comprehension: verbalized understanding  HOME EXERCISE PROGRAM: Access Code: [email or web address] URL: [email or web address] Date: 10/18/2022 Prepared by: Elease Etienne  Exercises - Corner Balance Feet Together With Eyes Open  - 1 x daily - 5 x weekly - 1 sets - 3 reps - 30 seconds hold - Standing Quarter Turn with Counter Support  - 1 x daily - 5 x weekly - 1 sets - 12-15 reps - Standing Single Leg Stance with Counter Support  - 1 x daily - 5 x weekly - 1 sets - 3 reps - 30 seconds hold - Corner Balance Feet Together With Eyes Closed  - 1 x daily - 5 x weekly - 1 sets - 3 reps - 30 seconds hold - Standing Tandem Balance with Counter Support  - 1 x daily - 5 x weekly - 1 sets - 3 reps - 30 seconds hold - Supine Bridge  - 1 x daily - 5 x weekly - 2 sets - 10 reps - Supine Lower Trunk Rotation  - 1 x daily - 5 x weekly - 2 sets - 10 reps - Seated Lumbar Flexion Stretch  - 1 x daily - 5 x weekly - 1 sets - 10 reps - 3 seconds hold - Backwards Walking  - 1 x daily - 5 x weekly - 3 sets - 10 reps  ASSESSMENT:  CLINICAL IMPRESSION: Finished assessing BERG this session for updated STG baseline with pt scoring 45/56 indicating some ongoing elevated fall risk, but significant improvement from evaluation.  Focused remainder of session on endurance training and overground gait training without and AD with pt still having some fatigue with longer unsupported distances impacting balance.  Worked on dynamic balance and made single addition to HEP to address hip extension deficits noted during ambulatory task.  Will continue POC.  OBJECTIVE IMPAIRMENTS: Abnormal gait, decreased balance, decreased mobility, difficulty walking, decreased strength, and postural dysfunction.  ACTIVITY LIMITATIONS:  carrying, lifting, bending, squatting, stairs, transfers, and locomotion level  PARTICIPATION LIMITATIONS: community activity  PERSONAL FACTORS: Fitness, Past/current experiences, and 1-2 comorbidities: HTN and DM2  are also affecting patient's functional outcome.   REHAB POTENTIAL: Excellent  CLINICAL DECISION MAKING: Stable/uncomplicated  EVALUATION COMPLEXITY: Low   GOALS: Goals reviewed with patient? Yes  SHORT TERM GOALS: Target date: 10/27/2022  Pt will be independent with initial strength and balance HEP to maintain progress. Baseline:  To be established.  Established and pt compliant (1/24) Goal status: MET  2.  Pt will increase BERG balance score to >/=42/56 to demonstrate improved static balance. Baseline: 37/56 (10/05/2022); 45/56 (2/1) Goal status: MET  3.  Pt will demonstrate a gait speed of >/=2.77 feet/sec using LRAD in order to decrease risk for falls. Baseline: 2.37 ft/sec w/ rollator; 3.31 ft/sec using NBQC (intermittently nothing-1/24) Goal status: MET  4.  Pt will decrease 5xSTS to </=13 seconds w/o UE support in order to demonstrate decreased risk for falls and improved functional bilateral LE strength and power. Baseline: 15.82 sec w/ BUE support; 11.25 sec w/ hands-on-knees (1/24) Goal status: IN PROGRESS  LONG TERM GOALS: Target date: 11/24/2022  Pt will be independent with advanced strength and balance HEP to maintain progress. Baseline: To be established. Goal status: INITIAL  2.  Pt will demonstrate a gait speed of >/=3.3 feet/sec using no AD in order to decrease risk for falls. Baseline: 2.37 ft/sec w/ rollator; 3.31 ft/sec w/ NBQC (intermittently nothing-1/24) Goal status: REVISED  3.  Pt will increase BERG balance score to >/=50/56 to demonstrate improved static balance. Baseline: 37/56 (10/05/2022); 45/56 (2/1) Goal status: REVISED  PLAN:  PT FREQUENCY: 1x/week  PT DURATION: 8 weeks  PLANNED INTERVENTIONS: Therapeutic exercises,  Therapeutic activity, Neuromuscular re-education, Balance training, Gait training, Patient/Family education, Self Care, Joint mobilization, Stair training, Vestibular training, Prosthetic training, DME instructions, Manual therapy, and Re-evaluation  PLAN FOR NEXT SESSION:  Modify HEP prn for strength and balance.  Work towards LRAD (pt's own NBQC vs none) w/ gait training.  Leg press, treadmill training-incline, hip/quad strength, dynamic balance, general endurance, hamstring stretch-add to HEP, hip extension, pt requesting to review floor recovery  Check all possible CPT codes: 97164 - PT Re-evaluation, 97110- Therapeutic Exercise, [phone]- Neuro Re-education, [phone] - Gait Training, [phone] - Manual Therapy, 97530 - Therapeutic Activities, [phone] - Self Care, and [phone] - Prosthetic training    Check all conditions that are expected to impact treatment: Diabetes mellitus   If treatment provided at initial evaluation, no treatment charged due to lack of authorization.    Bary Richard, PT, DPT 11/02/2022, 9:34 AM

## 2022-11-02 NOTE — Patient Instructions (Signed)
-  Backwards Walking  - 1 x daily - 5 x weekly - 3 sets - 10 reps

## 2022-11-08 ENCOUNTER — Ambulatory Visit: Payer: Medicaid Other | Admitting: Physical Therapy

## 2022-11-08 DIAGNOSIS — R2681 Unsteadiness on feet: Secondary | ICD-10-CM

## 2022-11-08 DIAGNOSIS — R2689 Other abnormalities of gait and mobility: Secondary | ICD-10-CM | POA: Diagnosis not present

## 2022-11-08 DIAGNOSIS — M6281 Muscle weakness (generalized): Secondary | ICD-10-CM

## 2022-11-08 NOTE — Therapy (Signed)
OUTPATIENT PHYSICAL THERAPY PROSTHETICS TREATMENT   Patient Name: [name] MRN: [id number] DOB:06/09/1986, 37 y.o., female Today's Date: 11/08/2022  PCP: Ivy Lynn, NP (pt states she is having to find a new provider as current one is leaving practice) REFERRING PROVIDER: Ivy Lynn, NP  END OF SESSION:  PT End of Session - 11/08/22 0846     Visit Number 7    Number of Visits 9   8+eval   Date for PT Re-Evaluation 11/24/22    Authorization Type [place] MEDICAID HEALTHY BLUE    PT Start Time 0845    PT Stop Time 0929    PT Time Calculation (min) 44 min    Equipment Utilized During Treatment Gait belt    Activity Tolerance Patient tolerated treatment well    Behavior During Therapy WFL for tasks assessed/performed              Past Medical History:  Diagnosis Date   Chronic hypertension    12-16-2020  documented in epic since 2017 pt dx hypertension (not during pregnancy);  pt currently is not and has not been taking any bp med,  bp at [facility] 12-15-2020 165/ 104   Missed ab 12/15/2020   Type 2 diabetes mellitus ([place])    12-16-2020  pt stated only during pregnancy took medication, at [facility] 12-15-2020 blood glucose 353 pt was given metformin bid and pt stated did start taking it   Wears glasses    Past Surgical History:  Procedure Laterality Date   BELOW KNEE LEG AMPUTATION Right    CESAREAN SECTION N/A 12/25/2018   Procedure: CESAREAN SECTION;  Surgeon: [name], DO;  Location: [facility] LD ORS;  Service: Obstetrics;  Laterality: N/A;   TONSILLECTOMY AND ADENOIDECTOMY  age 37   Patient Active Problem List   Diagnosis Date Noted   High triglycerides 08/08/2022   Chronic pain of right knee 01/13/2022   Nausea 01/13/2022   Morbid obesity ([place]) 11/30/2021   Type 2 diabetes mellitus with hyperglycemia ([place]) 11/30/2021   Non-healing amputation site ([place]) 10/12/2021   Necrotizing fasciitis of ankle and foot ([place]) 10/05/2021   Hypokalemia 10/02/2021    Hypoalbuminemia 09/26/2021   Positive for macroalbuminuria 09/26/2021   Acute blood loss anemia 09/22/2021   ATN (acute tubular necrosis) ([place]) 09/22/2021   AKI (acute kidney injury) ([place]) 09/20/2021   Hyponatremia 09/19/2021   Diabetes mellitus type 2, noninsulin dependent ([place]) 09/19/2021   Chronic hypertension 12/16/2020   Missed abortion 12/16/2020   Status post cesarean delivery 12/15/2020   Establishing care with new doctor, encounter for 12/22/2018   Pap smear abnormality of cervix/human papillomavirus (HPV) positive 06/25/2018   BMI 33.0-33.9,adult 03/30/2016    ONSET DATE: 09/08/2022 (received prosthetic on 09/19/2022)  REFERRING DIAG: Z89.511 (ICD-10-CM) - Status post below-knee amputation of right lower extremity (HCC)  THERAPY DIAG:  Other abnormalities of gait and mobility  Unsteadiness on feet  Muscle weakness (generalized)  Rationale for Evaluation and Treatment: Rehabilitation  SUBJECTIVE:   SUBJECTIVE STATEMENT: Pt reports no falls or acute changes since last session. Pt reports that she saw Bobby at [facility] yesterday and he adjusted her R foot angle (turned it back in). No pain today.  PERTINENT HISTORY: HTN, DM2-on insulin  She is s/p right BKA in the setting of necrotizing fasciitis by Dr.Long on 10-12-2021.  Followed by Dr. Earleen Newport at [facility].  She is followed by Mortimer Fries at [place] [facility].  PAIN:  Are you having pain? No  PRECAUTIONS: Fall and Other:  pt has recently been hypoglycemic frequently  WEIGHT BEARING RESTRICTIONS: No  OBJECTIVE:   CURRENT PROSTHETIC WEAR ASSESSMENT: Patient is independent with: skin check, residual limb care, care of non-amputated limb, prosthetic cleaning, ply sock cleaning, correct ply sock adjustment, proper wear schedule/adjustment, and proper weight-bearing schedule/adjustment Patient is dependent with:  none Donning prosthesis: Complete Independence Doffing prosthesis: Complete Independence Prosthetic wear tolerance: all  awake hours/day, 7 days/week Prosthetic weight bearing tolerance: 12 minutes Edema: pt reports limb volume stable, wearing shrinker when prosthesis is off except when A&D ointment is on at night Residual limb condition: skin intact, no wounds, incision healed; pt has no pain or skin breakdown with current wear schedule. Prosthetic description: Patellar-tendon bearing prosthesis K code/activity level with prosthetic use: Level 3  PATIENT EDUCATED ON FOLLOWING PROSTHETIC CARE: Prosthetic wear tolerance: all awake hours Prosthetic weight bearing tolerance: Progress towards 15 minutes Other education  Skin check, Residual limb care, Proper wear schedule/adjustment, and Proper weight-bearing schedule/adjustment  TODAY'S TREATMENT:                                                                                                                                         THER EX: At countertop with intermittent UE support: Resisted sidesteps L/R with GTB Resisted monster walks with GTB Resisted backwards gait with GTB  At mat table with no UE support: Resisted sit to stands with GTB, 3 x 10 reps  Added to HEP, see bolded below  GAIT: Treadmill at 1.3 mph x 5 min with focus on increasing incline up to 4% grade, RPE 1/10 (will increase challenge next session).  THER ACT: Reviewed floor recovery technique per pt request. Pt able to demonstrate good technique for recovery getting into tall kneeling then half-kneeling position and using mat table to boost herself back up. Pt able to perform transfer mod I. Also dicussed post-fall assessment prior to initiating transfer and that pt should call EMS if she hits her head, breaks anything, etc with fall.  In // bars for standing balance with progression to no UE support: Rockerboard A/P stance 4 x 30 sec each, posterior LOB Rockerboard L/R stance 4 x 30 sec each, LOB to the L   PATIENT EDUCATION: Education details: continue HEP, added to HEP Person  educated: Patient Education method: Chief Technology Officer Education comprehension: verbalized understanding  HOME EXERCISE PROGRAM: Access Code: [email or web address] URL: [email or web address] Date: 10/18/2022 Prepared by: Camille Bal  Exercises - Corner Balance Feet Together With Eyes Open  - 1 x daily - 5 x weekly - 1 sets - 3 reps - 30 seconds hold - Standing Quarter Turn with Counter Support  - 1 x daily - 5 x weekly - 1 sets - 12-15 reps - Standing Single Leg Stance with Counter Support  - 1 x daily - 5 x weekly - 1 sets - 3 reps - 30 seconds hold - Oncologist Feet Together  With Eyes Closed  - 1 x daily - 5 x weekly - 1 sets - 3 reps - 30 seconds hold - Standing Tandem Balance with Counter Support  - 1 x daily - 5 x weekly - 1 sets - 3 reps - 30 seconds hold - Supine Bridge  - 1 x daily - 5 x weekly - 2 sets - 10 reps - Supine Lower Trunk Rotation  - 1 x daily - 5 x weekly - 2 sets - 10 reps - Seated Lumbar Flexion Stretch  - 1 x daily - 5 x weekly - 1 sets - 10 reps - 3 seconds hold - Backwards Walking  - 1 x daily - 5 x weekly - 3 sets - 10 reps - Side Stepping with Resistance at Ankles and Counter Support  - 1 x daily - 5 x weekly - 3 sets - 10 reps - Forward Monster Walk with Resistance at Ankles and Counter Support  - 1 x daily - 5 x weekly - 3 sets - 10 reps - Sit to Stand with Resistance Around Legs  - 1 x daily - 5 x weekly - 3 sets - 10 reps   ASSESSMENT:  CLINICAL IMPRESSION: Emphasis of skilled PT session on working on BLE hip strengthening and adding exercises to HEP as well as working on standing balance strategies. Pt exhibits good challenge with addition of resisted exercises this date. Pt continues to benefit from skilled therapy services to address ongoing LE weakness and impaired balance. Continue POC.   OBJECTIVE IMPAIRMENTS: Abnormal gait, decreased balance, decreased mobility, difficulty walking, decreased strength, and postural dysfunction.    ACTIVITY LIMITATIONS: carrying, lifting, bending, squatting, stairs, transfers, and locomotion level  PARTICIPATION LIMITATIONS: community activity  PERSONAL FACTORS: Fitness, Past/current experiences, and 1-2 comorbidities: HTN and DM2  are also affecting patient's functional outcome.   REHAB POTENTIAL: Excellent  CLINICAL DECISION MAKING: Stable/uncomplicated  EVALUATION COMPLEXITY: Low   GOALS: Goals reviewed with patient? Yes  SHORT TERM GOALS: Target date: 10/27/2022  Pt will be independent with initial strength and balance HEP to maintain progress. Baseline:  To be established.  Established and pt compliant (1/24) Goal status: MET  2.  Pt will increase BERG balance score to >/=42/56 to demonstrate improved static balance. Baseline: 37/56 (10/05/2022); 45/56 (2/1) Goal status: MET  3.  Pt will demonstrate a gait speed of >/=2.77 feet/sec using LRAD in order to decrease risk for falls. Baseline: 2.37 ft/sec w/ rollator; 3.31 ft/sec using NBQC (intermittently nothing-1/24) Goal status: MET  4.  Pt will decrease 5xSTS to </=13 seconds w/o UE support in order to demonstrate decreased risk for falls and improved functional bilateral LE strength and power. Baseline: 15.82 sec w/ BUE support; 11.25 sec w/ hands-on-knees (1/24) Goal status: IN PROGRESS  LONG TERM GOALS: Target date: 11/24/2022  Pt will be independent with advanced strength and balance HEP to maintain progress. Baseline: To be established. Goal status: INITIAL  2.  Pt will demonstrate a gait speed of >/=3.3 feet/sec using no AD in order to decrease risk for falls. Baseline: 2.37 ft/sec w/ rollator; 3.31 ft/sec w/ NBQC (intermittently nothing-1/24) Goal status: REVISED  3.  Pt will increase BERG balance score to >/=50/56 to demonstrate improved static balance. Baseline: 37/56 (10/05/2022); 45/56 (2/1) Goal status: REVISED  PLAN:  PT FREQUENCY: 1x/week  PT DURATION: 8 weeks  PLANNED INTERVENTIONS:  Therapeutic exercises, Therapeutic activity, Neuromuscular re-education, Balance training, Gait training, Patient/Family education, Self Care, Joint mobilization, Stair training, Vestibular training, Prosthetic  training, DME instructions, Manual therapy, and Re-evaluation  PLAN FOR NEXT SESSION:  Modify HEP prn for strength and balance.  Work towards LRAD (pt's own NBQC vs none) w/ gait training.  Leg press, treadmill training-incline, hip/quad strength, dynamic balance, general endurance, hamstring stretch-add to HEP, hip extension  Check all possible CPT codes: 97164 - PT Re-evaluation, 97110- Therapeutic Exercise, [phone]- Neuro Re-education, [phone] - Gait Training, [phone] - Manual Therapy, [phone] - Therapeutic Activities, [phone] - Self Care, and [phone] - Prosthetic training    Check all conditions that are expected to impact treatment: Diabetes mellitus   If treatment provided at initial evaluation, no treatment charged due to lack of authorization.    Excell Seltzer, PT, DPT, CSRS 11/08/2022, 9:30 AM

## 2022-11-15 ENCOUNTER — Encounter: Payer: Self-pay | Admitting: Physical Therapy

## 2022-11-15 ENCOUNTER — Ambulatory Visit: Payer: Medicaid Other | Admitting: Physical Therapy

## 2022-11-15 DIAGNOSIS — R2689 Other abnormalities of gait and mobility: Secondary | ICD-10-CM | POA: Diagnosis not present

## 2022-11-15 DIAGNOSIS — M6281 Muscle weakness (generalized): Secondary | ICD-10-CM

## 2022-11-15 DIAGNOSIS — R2681 Unsteadiness on feet: Secondary | ICD-10-CM

## 2022-11-15 NOTE — Therapy (Signed)
OUTPATIENT PHYSICAL THERAPY PROSTHETICS TREATMENT   Patient Name: [name] MRN: [id number] DOB:1986/04/05, 37 y.o., female Today's Date: 11/15/2022  PCP: Ivy Lynn, NP (pt states she is having to find a new provider as current one is leaving practice) REFERRING PROVIDER: Ivy Lynn, NP  END OF SESSION:  PT End of Session - 11/15/22 0854     Visit Number 8    Number of Visits 9   8+eval   Date for PT Re-Evaluation 11/24/22    Authorization Type [place] MEDICAID HEALTHY BLUE    PT Start Time 0850    PT Stop Time 0930    PT Time Calculation (min) 40 min    Equipment Utilized During Treatment Gait belt    Activity Tolerance Patient tolerated treatment well    Behavior During Therapy WFL for tasks assessed/performed              Past Medical History:  Diagnosis Date   Chronic hypertension    12-16-2020  documented in epic since 2017 pt dx hypertension (not during pregnancy);  pt currently is not and has not been taking any bp med,  bp at [facility] 12-15-2020 165/ 104   Missed ab 12/15/2020   Type 2 diabetes mellitus ([place])    12-16-2020  pt stated only during pregnancy took medication, at [facility] 12-15-2020 blood glucose 353 pt was given metformin bid and pt stated did start taking it   Wears glasses    Past Surgical History:  Procedure Laterality Date   BELOW KNEE LEG AMPUTATION Right    CESAREAN SECTION N/A 12/25/2018   Procedure: CESAREAN SECTION;  Surgeon: [name], DO;  Location: [place] [facility];  Service: Obstetrics;  Laterality: N/A;   TONSILLECTOMY AND ADENOIDECTOMY  age 68   Patient Active Problem List   Diagnosis Date Noted   High triglycerides 08/08/2022   Chronic pain of right knee 01/13/2022   Nausea 01/13/2022   Morbid obesity ([place]) 11/30/2021   Type 2 diabetes mellitus with hyperglycemia ([place]) 11/30/2021   Non-healing amputation site ([place]) 10/12/2021   Necrotizing fasciitis of ankle and foot ([place]) 10/05/2021   Hypokalemia 10/02/2021    Hypoalbuminemia 09/26/2021   Positive for macroalbuminuria 09/26/2021   Acute blood loss anemia 09/22/2021   ATN (acute tubular necrosis) ([place]) 09/22/2021   AKI (acute kidney injury) ([place]) 09/20/2021   Hyponatremia 09/19/2021   Diabetes mellitus type 2, noninsulin dependent ([place]) 09/19/2021   Chronic hypertension 12/16/2020   Missed abortion 12/16/2020   Status post cesarean delivery 12/15/2020   Establishing care with new doctor, encounter for 12/22/2018   Pap smear abnormality of cervix/human papillomavirus (HPV) positive 06/25/2018   BMI 33.0-33.9,adult 03/30/2016    ONSET DATE: 09/08/2022 (received prosthetic on 09/19/2022)  REFERRING DIAG: Z89.511 (ICD-10-CM) - Status post below-knee amputation of right lower extremity ([place])  THERAPY DIAG:  Other abnormalities of gait and mobility  Unsteadiness on feet  Muscle weakness (generalized)  Rationale for Evaluation and Treatment: Rehabilitation  SUBJECTIVE:   SUBJECTIVE STATEMENT: Pt saw Bobby last Tuesday (11/07/2022) for adjustment turning the foot in to midline (previously in out-toe position).  She feels like she is not walking right, but denies feeling like she is going to fall.  She also denies falls or other acute changes.  PERTINENT HISTORY: HTN, DM2-on insulin  She is s/p right BKA in the setting of necrotizing fasciitis by Dr.Long on 10-12-2021.  Followed by [facility] Earleen Newport at [facility].  She is followed by Mortimer Fries at [place] [facility].  PAIN:  Are you having pain? No  PRECAUTIONS: Fall and Other: pt has recently been hypoglycemic frequently  WEIGHT BEARING RESTRICTIONS: No  OBJECTIVE:   CURRENT PROSTHETIC WEAR ASSESSMENT: Patient is independent with: skin check, residual limb care, care of non-amputated limb, prosthetic cleaning, ply sock cleaning, correct ply sock adjustment, proper wear schedule/adjustment, and proper weight-bearing schedule/adjustment Patient is dependent with:  none Donning prosthesis: Complete  Independence Doffing prosthesis: Complete Independence Prosthetic wear tolerance: all awake hours/day, 7 days/week Prosthetic weight bearing tolerance: >1 hour yesterday, on average >/=15 minutes Edema: pt reports limb volume stable, wearing shrinker when prosthesis is off except when A&D ointment is on at night Residual limb condition: skin intact, no wounds, incision healed; pt has no pain or skin breakdown with current wear schedule or recent prosthesis changes. Prosthetic description: Patellar-tendon bearing prosthesis K code/activity level with prosthetic use: Level 3  PATIENT EDUCATED ON FOLLOWING PROSTHETIC CARE: Prosthetic wear tolerance: all awake hours Prosthetic weight bearing tolerance: Progress towards 20 minutes w/ good skin integrity Other education  Skin check, Residual limb care, Proper wear schedule/adjustment, and Proper weight-bearing schedule/adjustment  TODAY'S TREATMENT:                                                                                                                                         -Discussed trialing new foot positioning in clinic today and new things at home before calling [facility] back about reassessing foot position of prosthesis since recent adjustment. -Further discussed plan for next visit, pt is unsure she needs to continue w/ PT at this time and would like to see goal progress before plan is decided.  THER EX: Seated KTC on right hip 2x45 sec > using 8" box and forward fold position 2x45 sec to target low back and top of hip > seated piriformis stretch using chair to support prosthesis 2x45 sec added forward lean 2x20 seconds  NMR: 4-way stepping clockwise and counterclockwise x1 each > diagonal stepping forwards and backwards in left and right diagonals x4 each direction 8" hurdles attempted w/o UE support regressed to BUE support in // bars w/ focus on clearing hurdles w/o circumduction  -Discussed and verbally reviewed HEP, removed easy  items and added stretches and hip flexion holds to target (noted difficulty w/ hurdles this visit).  See below for details.  PATIENT EDUCATION: Education details: continue HEP, added to HEP Person educated: Patient Education method: Theatre stage manager Education comprehension: verbalized understanding  HOME EXERCISE PROGRAM: Access Code: [email or web address] URL: [email or web address] Date: 11/15/2022 Prepared by: Elease Etienne  Exercises - Corner Balance Feet Together With Eyes Closed  - 1 x daily - 5 x weekly - 1 sets - 3 reps - 30 seconds hold - Standing Tandem Balance with Counter Support  - 1 x daily - 5 x weekly - 1 sets - 3 reps - 30 seconds hold - Supine Bridge  - 1  x daily - 5 x weekly - 2 sets - 10 reps - Supine Lower Trunk Rotation  - 1 x daily - 5 x weekly - 2 sets - 10 reps - Seated Lumbar Flexion Stretch  - 1 x daily - 5 x weekly - 1 sets - 10 reps - 3 seconds hold - Backwards Walking  - 1 x daily - 5 x weekly - 3 sets - 10 reps - Side Stepping with Resistance at Ankles and Counter Support  - 1 x daily - 5 x weekly - 3 sets - 10 reps - Forward Monster Walk with Resistance at Ankles and Counter Support  - 1 x daily - 5 x weekly - 3 sets - 10 reps - Seated Single Knee to Chest  - 1 x daily - 5 x weekly - 1 sets - 2-3 reps - 45 seconds hold - Seated Piriformis Stretch with Trunk Bend  - 1 x daily - 5 x weekly - 1 sets - 2-3 reps - 45 seconds hold - Standing Marching  - 1 x daily - 5 x weekly - 3 sets - 5 reps - 10 seconds hold (emphasis on hip flexion hold w/ 8" target)  ASSESSMENT:  CLINICAL IMPRESSION: Focus of skilled session today on addressing ongoing anterior hip weakness and posterior hip and low back tightness to balance kinetic chain.  Pt is ambulating w/o AD almost full-time without issue, but one instance of increased LOB due to prosthetic toe catch noted this visit.  Pt is still adjusting to recent in-toe adjustment (to midline) of prosthetic foot so  LOB likely due to this.  She accepts all challenges well with modifications made to HEP this visit.  She is due for goal reassessment and further POC adjustments next session.   OBJECTIVE IMPAIRMENTS: Abnormal gait, decreased balance, decreased mobility, difficulty walking, decreased strength, and postural dysfunction.   ACTIVITY LIMITATIONS: carrying, lifting, bending, squatting, stairs, transfers, and locomotion level  PARTICIPATION LIMITATIONS: community activity  PERSONAL FACTORS: Fitness, Past/current experiences, and 1-2 comorbidities: HTN and DM2  are also affecting patient's functional outcome.   REHAB POTENTIAL: Excellent  CLINICAL DECISION MAKING: Stable/uncomplicated  EVALUATION COMPLEXITY: Low   GOALS: Goals reviewed with patient? Yes  SHORT TERM GOALS: Target date: 10/27/2022  Pt will be independent with initial strength and balance HEP to maintain progress. Baseline:  To be established.  Established and pt compliant (1/24) Goal status: MET  2.  Pt will increase BERG balance score to >/=42/56 to demonstrate improved static balance. Baseline: 37/56 (10/05/2022); 45/56 (2/1) Goal status: MET  3.  Pt will demonstrate a gait speed of >/=2.77 feet/sec using LRAD in order to decrease risk for falls. Baseline: 2.37 ft/sec w/ rollator; 3.31 ft/sec using NBQC (intermittently nothing-1/24) Goal status: MET  4.  Pt will decrease 5xSTS to </=13 seconds w/o UE support in order to demonstrate decreased risk for falls and improved functional bilateral LE strength and power. Baseline: 15.82 sec w/ BUE support; 11.25 sec w/ hands-on-knees (1/24) Goal status: IN PROGRESS  LONG TERM GOALS: Target date: 11/24/2022  Pt will be independent with advanced strength and balance HEP to maintain progress. Baseline: To be established. Goal status: INITIAL  2.  Pt will demonstrate a gait speed of >/=3.3 feet/sec using no AD in order to decrease risk for falls. Baseline: 2.37 ft/sec w/  rollator; 3.31 ft/sec w/ NBQC (intermittently nothing-1/24) Goal status: REVISED  3.  Pt will increase BERG balance score to >/=50/56 to demonstrate improved static balance. Baseline:  37/56 (10/05/2022); 45/56 (2/1) Goal status: REVISED  PLAN:  PT FREQUENCY: 1x/week  PT DURATION: 8 weeks  PLANNED INTERVENTIONS: Therapeutic exercises, Therapeutic activity, Neuromuscular re-education, Balance training, Gait training, Patient/Family education, Self Care, Joint mobilization, Stair training, Vestibular training, Prosthetic training, DME instructions, Manual therapy, and Re-evaluation  PLAN FOR NEXT SESSION:  ASSESS LTGs-re-cert or D/C?  Modify HEP prn for strength and balance.  Work towards LRAD (pt's own NBQC vs none) w/ gait training.  Leg press, treadmill training-incline, hip/quad strength, dynamic balance, general endurance, hamstring stretch-add to HEP, hip extension  Check all possible CPT codes: 97164 - PT Re-evaluation, 97110- Therapeutic Exercise, [phone]- Neuro Re-education, [phone] - Gait Training, [phone] - Manual Therapy, [phone] - Therapeutic Activities, [phone] - Self Care, and [phone] - Prosthetic training    Check all conditions that are expected to impact treatment: Diabetes mellitus   If treatment provided at initial evaluation, no treatment charged due to lack of authorization.    Bary Richard, PT, DPT 11/15/2022, 9:36 AM

## 2022-11-15 NOTE — Patient Instructions (Signed)
Access Code: [email or web address] URL: [email or web address] Date: 11/15/2022 Prepared by: Elease Etienne  Exercises - Corner Balance Feet Together With Eyes Closed  - 1 x daily - 5 x weekly - 1 sets - 3 reps - 30 seconds hold - Standing Tandem Balance with Counter Support  - 1 x daily - 5 x weekly - 1 sets - 3 reps - 30 seconds hold - Supine Bridge  - 1 x daily - 5 x weekly - 2 sets - 10 reps - Supine Lower Trunk Rotation  - 1 x daily - 5 x weekly - 2 sets - 10 reps - Seated Lumbar Flexion Stretch  - 1 x daily - 5 x weekly - 1 sets - 10 reps - 3 seconds hold - Backwards Walking  - 1 x daily - 5 x weekly - 3 sets - 10 reps - Side Stepping with Resistance at Ankles and Counter Support  - 1 x daily - 5 x weekly - 3 sets - 10 reps - Forward Monster Walk with Resistance at Ankles and Counter Support  - 1 x daily - 5 x weekly - 3 sets - 10 reps - Seated Single Knee to Chest  - 1 x daily - 5 x weekly - 1 sets - 2-3 reps - 45 seconds hold - Seated Piriformis Stretch with Trunk Bend  - 1 x daily - 5 x weekly - 1 sets - 2-3 reps - 45 seconds hold - Standing Marching  - 1 x daily - 5 x weekly - 3 sets - 5 reps - 10 seconds hold

## 2022-11-22 ENCOUNTER — Ambulatory Visit: Payer: Medicaid Other | Admitting: Physical Therapy

## 2022-11-22 ENCOUNTER — Encounter: Payer: Self-pay | Admitting: Physical Therapy

## 2022-11-22 DIAGNOSIS — R2681 Unsteadiness on feet: Secondary | ICD-10-CM

## 2022-11-22 DIAGNOSIS — R2689 Other abnormalities of gait and mobility: Secondary | ICD-10-CM

## 2022-11-22 DIAGNOSIS — M6281 Muscle weakness (generalized): Secondary | ICD-10-CM

## 2022-11-22 NOTE — Therapy (Signed)
OUTPATIENT PHYSICAL THERAPY PROSTHETICS TREATMENT/DISCHARGE SUMMARY   Patient Name: [name] MRN: [id number] DOB:1985/11/21, 37 y.o., female Today's Date: 11/22/2022  PCP: Ivy Lynn, NP (pt states she is having to find a new provider as current one is leaving practice) REFERRING PROVIDER: Ivy Lynn, NP  PHYSICAL THERAPY DISCHARGE SUMMARY  Visits from Start of Care: 9  Current functional level related to goals / functional outcomes: See clinical impression statement.   Remaining deficits: Needed socket adjustment   Education / Equipment: Discharge plan, continue HEP, and follow-up w/ [facility].  Patient agrees to discharge. Patient goals were met. Patient is being discharged due to meeting the stated rehab goals.   END OF SESSION:  PT End of Session - 11/22/22 0855     Visit Number 9    Number of Visits 9   8+eval   Date for PT Re-Evaluation 11/24/22    Authorization Type [place] MEDICAID HEALTHY BLUE    PT Start Time 0850    PT Stop Time 0925    PT Time Calculation (min) 35 min    Equipment Utilized During Treatment Gait belt    Activity Tolerance Patient tolerated treatment well    Behavior During Therapy WFL for tasks assessed/performed              Past Medical History:  Diagnosis Date   Chronic hypertension    12-16-2020  documented in epic since 2017 pt dx hypertension (not during pregnancy);  pt currently is not and has not been taking any bp med,  bp at [facility] 12-15-2020 165/ 104   Missed ab 12/15/2020   Type 2 diabetes mellitus ([place])    12-16-2020  pt stated only during pregnancy took medication, at [facility] 12-15-2020 blood glucose 353 pt was given metformin bid and pt stated did start taking it   Wears glasses    Past Surgical History:  Procedure Laterality Date   BELOW KNEE LEG AMPUTATION Right    CESAREAN SECTION N/A 12/25/2018   Procedure: CESAREAN SECTION;  Surgeon: [name], DO;  Location: [facility];  Service: Obstetrics;   Laterality: N/A;   TONSILLECTOMY AND ADENOIDECTOMY  age 37   Patient Active Problem List   Diagnosis Date Noted   High triglycerides 08/08/2022   Chronic pain of right knee 01/13/2022   Nausea 01/13/2022   Morbid obesity ([place]) 11/30/2021   Type 2 diabetes mellitus with hyperglycemia ([place]) 11/30/2021   Non-healing amputation site ([place]) 10/12/2021   Necrotizing fasciitis of ankle and foot ([place]) 10/05/2021   Hypokalemia 10/02/2021   Hypoalbuminemia 09/26/2021   Positive for macroalbuminuria 09/26/2021   Acute blood loss anemia 09/22/2021   ATN (acute tubular necrosis) ([place]) 09/22/2021   AKI (acute kidney injury) ([place]) 09/20/2021   Hyponatremia 09/19/2021   Diabetes mellitus type 2, noninsulin dependent ([place]) 09/19/2021   Chronic hypertension 12/16/2020   Missed abortion 12/16/2020   Status post cesarean delivery 12/15/2020   Establishing care with new doctor, encounter for 12/22/2018   Pap smear abnormality of cervix/human papillomavirus (HPV) positive 06/25/2018   BMI 33.0-33.9,adult 03/30/2016    ONSET DATE: 09/08/2022 (received prosthetic on 09/19/2022)  REFERRING DIAG: Z89.511 (ICD-10-CM) - Status post below-knee amputation of right lower extremity (HCC)  THERAPY DIAG:  Other abnormalities of gait and mobility  Unsteadiness on feet  Muscle weakness (generalized)  Rationale for Evaluation and Treatment: Rehabilitation  SUBJECTIVE:   SUBJECTIVE STATEMENT: Pt has not seen Bobby regarding toe out on prosthetic as she is attempting to get  used to it still and feels it is getting better.  PT encouraged pt to contact [facility] should it impact her balance or walking pattern more so than now.  She states she walked in a children's museum Saturday w/o her cane and did well and tolerated this without issue.  She was sore on Monday, but no issues with residual limb.  Skin remains healthy, closed, and dry.  She also denies falls or other acute changes and is regularly ambulating w/o AD.   She ambulates into clinic today w/o cane.  PERTINENT HISTORY: HTN, DM2-on insulin  She is s/p right BKA in the setting of necrotizing fasciitis by Dr.Long on 10-12-2021.  Followed by Dr. Earleen Newport at [facility].  She is followed by Mortimer Fries at [place] [facility].  PAIN:  Are you having pain? No  PRECAUTIONS: Fall and Other: pt has recently been hypoglycemic frequently  WEIGHT BEARING RESTRICTIONS: No  OBJECTIVE:   CURRENT PROSTHETIC WEAR ASSESSMENT: Patient is independent with: skin check, residual limb care, care of non-amputated limb, prosthetic cleaning, ply sock cleaning, correct ply sock adjustment, proper wear schedule/adjustment, and proper weight-bearing schedule/adjustment Patient is dependent with:  none Donning prosthesis: Complete Independence Doffing prosthesis: Complete Independence Prosthetic wear tolerance: all awake hours/day, 7 days/week Prosthetic weight bearing tolerance: >3 hours Saturday, on average >/=20 minutes Edema: pt reports limb volume stable, wearing shrinker when prosthesis is off except when A&D ointment is on at night Residual limb condition: skin intact, no wounds, incision healed; pt has no pain or skin breakdown with current wear schedule or recent prosthesis changes. Prosthetic description: Patellar-tendon bearing prosthesis K code/activity level with prosthetic use: Level 3  PATIENT EDUCATED ON FOLLOWING PROSTHETIC CARE: Prosthetic wear tolerance: all awake hours Prosthetic weight bearing tolerance: as tolerated >/=20 minutes at a time Other education  Skin check, Residual limb care, Proper wear schedule/adjustment, and Proper weight-bearing schedule/adjustment  TODAY'S TREATMENT:                                                                                                                                         -Verbally reviewed HEP, pt compliant and regularly active w/ young son. -10MWT no AD:  7.84 sec = 1.28 m/sec OR 4.21 ft/sec -BERG:  [facility] PT  Assessment - 11/22/22 0912       Berg Balance Test   Sit to Stand Able to stand without using hands and stabilize independently    Standing Unsupported Able to stand safely 2 minutes    Sitting with Back Unsupported but Feet Supported on Floor or Stool Able to sit safely and securely 2 minutes    Stand to Sit Sits safely with minimal use of hands    Transfers Able to transfer safely, minor use of hands    Standing Unsupported with Eyes Closed Able to stand 10 seconds safely    Standing Unsupported with Feet Together Able to place feet together  independently and stand 1 minute safely   mildly limited in feet together due to socket flare   From Standing, Reach Forward with Outstretched Arm Can reach confidently >25 cm (10")    From Standing Position, Pick up Object from Floor Able to pick up shoe safely and easily    From Standing Position, Turn to Look Behind Over each Shoulder Looks behind from both sides and weight shifts well    Turn 360 Degrees Able to turn 360 degrees safely in 4 seconds or less    Standing Unsupported, Alternately Place Feet on Step/Stool Able to stand independently and safely and complete 8 steps in 20 seconds    Standing Unsupported, One Foot in Front Able to take small step independently and hold 30 seconds    Standing on One Leg Tries to lift leg/unable to hold 3 seconds but remains standing independently    Total Score 51    Berg comment: 51/56 =Moderate Fall Risk (1 pt below low fall risk)            -Discussed PT calling [facility] about sooner appt for socket adjustment as pt is bottoming out wearing 6 ply consistently, causing patella weight-bearing w/o skin breakdown or abnormal redness at current.  Discussed trialing further sock adjustment until can be seen.  Pt has beach trip scheduled for end of March and needs further suggestions about prosthetic management in this setting, deferred to [place] at [place].  PATIENT EDUCATION: Education details: Discharge  plan, follow-up with [facility], and continue HEP. Person educated: Patient Education method: Explanation and Handouts Education comprehension: verbalized understanding  HOME EXERCISE PROGRAM: Access Code: [email or web address] URL: [email or web address] Date: 11/15/2022 Prepared by: Elease Etienne  Exercises - Corner Balance Feet Together With Eyes Closed  - 1 x daily - 5 x weekly - 1 sets - 3 reps - 30 seconds hold - Standing Tandem Balance with Counter Support  - 1 x daily - 5 x weekly - 1 sets - 3 reps - 30 seconds hold - Supine Bridge  - 1 x daily - 5 x weekly - 2 sets - 10 reps - Supine Lower Trunk Rotation  - 1 x daily - 5 x weekly - 2 sets - 10 reps - Seated Lumbar Flexion Stretch  - 1 x daily - 5 x weekly - 1 sets - 10 reps - 3 seconds hold - Backwards Walking  - 1 x daily - 5 x weekly - 3 sets - 10 reps - Side Stepping with Resistance at Ankles and Counter Support  - 1 x daily - 5 x weekly - 3 sets - 10 reps - Forward Monster Walk with Resistance at Ankles and Counter Support  - 1 x daily - 5 x weekly - 3 sets - 10 reps - Seated Single Knee to Chest  - 1 x daily - 5 x weekly - 1 sets - 2-3 reps - 45 seconds hold - Seated Piriformis Stretch with Trunk Bend  - 1 x daily - 5 x weekly - 1 sets - 2-3 reps - 45 seconds hold - Standing Marching  - 1 x daily - 5 x weekly - 3 sets - 5 reps - 10 seconds hold (emphasis on hip flexion hold w/ 8" target)  ASSESSMENT:  CLINICAL IMPRESSION: Assessed LTGs this session with patient meeting 3 of 3.  She is ambulating w/o an AD at a speed of 4.21 ft/sec w/ no overt LOB.  Did recommend taking cane for excessive distances or unfamiliar surfaces on  initial attempts to prevent falls.  She does need a follow-up with [facility] for potential padding added to inside of socket or other options to prevent patella weight-bearing and bottoming out because at current thicker ply socks are not solving this problem.  Her BERG score today is drastically improved to  just shy of low fall risk category at 51/56.  At present, she is appropriate for and in agreement to discharge.   OBJECTIVE IMPAIRMENTS: Abnormal gait, decreased balance, decreased mobility, difficulty walking, decreased strength, and postural dysfunction.   ACTIVITY LIMITATIONS: carrying, lifting, bending, squatting, stairs, transfers, and locomotion level  PARTICIPATION LIMITATIONS: community activity  PERSONAL FACTORS: Fitness, Past/current experiences, and 1-2 comorbidities: HTN and DM2  are also affecting patient's functional outcome.   REHAB POTENTIAL: Excellent  CLINICAL DECISION MAKING: Stable/uncomplicated  EVALUATION COMPLEXITY: Low   GOALS: Goals reviewed with patient? Yes  SHORT TERM GOALS: Target date: 10/27/2022  Pt will be independent with initial strength and balance HEP to maintain progress. Baseline:  To be established.  Established and pt compliant (1/24) Goal status: MET  2.  Pt will increase BERG balance score to >/=42/56 to demonstrate improved static balance. Baseline: 37/56 (10/05/2022); 45/56 (2/1) Goal status: MET  3.  Pt will demonstrate a gait speed of >/=2.77 feet/sec using LRAD in order to decrease risk for falls. Baseline: 2.37 ft/sec w/ rollator; 3.31 ft/sec using NBQC (intermittently nothing-1/24) Goal status: MET  4.  Pt will decrease 5xSTS to </=13 seconds w/o UE support in order to demonstrate decreased risk for falls and improved functional bilateral LE strength and power. Baseline: 15.82 sec w/ BUE support; 11.25 sec w/ hands-on-knees (1/24) Goal status: IN PROGRESS  LONG TERM GOALS: Target date: 11/24/2022  Pt will be independent with advanced strength and balance HEP to maintain progress. Baseline: HEP established and advanced at last session, pt compliant (2/21) Goal status: MET  2.  Pt will demonstrate a gait speed of >/=3.3 feet/sec using no AD in order to decrease risk for falls. Baseline: 2.37 ft/sec w/ rollator; 3.31 ft/sec w/  NBQC (intermittently nothing-1/24); 4.21 ft/sec no AD (2/21) Goal status: MET  3.  Pt will increase BERG balance score to >/=50/56 to demonstrate improved static balance. Baseline: 37/56 (10/05/2022); 45/56 (2/1); 51/56 (2/21) Goal status: MET  PLAN:  PT FREQUENCY: 1x/week  PT DURATION: 8 weeks  PLANNED INTERVENTIONS: Therapeutic exercises, Therapeutic activity, Neuromuscular re-education, Balance training, Gait training, Patient/Family education, Self Care, Joint mobilization, Stair training, Vestibular training, Prosthetic training, DME instructions, Manual therapy, and Re-evaluation  PLAN FOR NEXT SESSION:  N/A  Check all possible CPT codes: [id number] - PT Re-evaluation, 97110- Therapeutic Exercise, [phone]- Neuro Re-education, [phone] - Gait Training, [phone] - Manual Therapy, [phone] - Therapeutic Activities, [phone] - Self Care, and [phone] - Prosthetic training    Check all conditions that are expected to impact treatment: Diabetes mellitus   If treatment provided at initial evaluation, no treatment charged due to lack of authorization.    Bary Richard, PT, DPT 11/22/2022, 10:15 AM

## 2023-01-17 ENCOUNTER — Encounter: Payer: Self-pay | Admitting: Nurse Practitioner

## 2023-01-17 ENCOUNTER — Other Ambulatory Visit: Payer: Self-pay | Admitting: *Deleted

## 2023-01-17 DIAGNOSIS — E1165 Type 2 diabetes mellitus with hyperglycemia: Secondary | ICD-10-CM

## 2023-01-17 MED ORDER — SEMAGLUTIDE (2 MG/DOSE) 8 MG/3ML ~~LOC~~ SOPN: 2.0000 mg | PEN_INJECTOR | SUBCUTANEOUS | 0 refills | Status: DC

## 2023-01-17 NOTE — Telephone Encounter (Signed)
JE pt NTBS by new provider for 6 mos FU in May 30-d given today

## 2023-01-17 NOTE — Telephone Encounter (Signed)
Lmtcb. Letter mailed 

## 2023-03-12 ENCOUNTER — Telehealth: Payer: Medicaid Other

## 2023-03-13 ENCOUNTER — Encounter: Payer: Self-pay | Admitting: Nurse Practitioner

## 2023-03-13 ENCOUNTER — Ambulatory Visit: Payer: Medicaid Other | Admitting: Nurse Practitioner

## 2023-03-13 VITALS — BP 122/84 | HR 114 | Temp 98.3°F | Ht 67.0 in | Wt 191.0 lb

## 2023-03-13 DIAGNOSIS — E11628 Type 2 diabetes mellitus with other skin complications: Secondary | ICD-10-CM | POA: Diagnosis not present

## 2023-03-13 DIAGNOSIS — E781 Pure hyperglyceridemia: Secondary | ICD-10-CM

## 2023-03-13 DIAGNOSIS — R110 Nausea: Secondary | ICD-10-CM

## 2023-03-13 DIAGNOSIS — I10 Essential (primary) hypertension: Secondary | ICD-10-CM

## 2023-03-13 DIAGNOSIS — E1165 Type 2 diabetes mellitus with hyperglycemia: Secondary | ICD-10-CM | POA: Diagnosis not present

## 2023-03-13 DIAGNOSIS — J302 Other seasonal allergic rhinitis: Secondary | ICD-10-CM

## 2023-03-13 DIAGNOSIS — E1151 Type 2 diabetes mellitus with diabetic peripheral angiopathy without gangrene: Secondary | ICD-10-CM

## 2023-03-13 DIAGNOSIS — E119 Type 2 diabetes mellitus without complications: Secondary | ICD-10-CM | POA: Insufficient documentation

## 2023-03-13 DIAGNOSIS — Z7985 Long-term (current) use of injectable non-insulin antidiabetic drugs: Secondary | ICD-10-CM | POA: Insufficient documentation

## 2023-03-13 DIAGNOSIS — Z794 Long term (current) use of insulin: Secondary | ICD-10-CM

## 2023-03-13 LAB — PREGNANCY, URINE: Preg Test, Ur: NEGATIVE

## 2023-03-13 LAB — BAYER DCA HB A1C WAIVED: HB A1C (BAYER DCA - WAIVED): 10.3 % — ABNORMAL HIGH (ref 4.8–5.6)

## 2023-03-13 MED ORDER — ONDANSETRON HCL 4 MG PO TABS
4.0000 mg | ORAL_TABLET | Freq: Three times a day (TID) | ORAL | 3 refills | Status: DC | PRN
Start: 2023-03-13 — End: 2023-03-18

## 2023-03-13 MED ORDER — METFORMIN HCL ER 500 MG PO TB24
500.0000 mg | ORAL_TABLET | Freq: Every day | ORAL | 0 refills | Status: DC
Start: 2023-03-13 — End: 2024-07-20

## 2023-03-13 MED ORDER — NOVOLOG FLEXPEN RELION 100 UNIT/ML ~~LOC~~ SOPN
5.0000 [IU] | PEN_INJECTOR | Freq: Three times a day (TID) | SUBCUTANEOUS | 1 refills | Status: AC
Start: 2023-03-13 — End: ?

## 2023-03-13 MED ORDER — CETIRIZINE HCL 10 MG PO TABS
10.0000 mg | ORAL_TABLET | Freq: Every day | ORAL | 0 refills | Status: DC
Start: 2023-03-13 — End: 2024-07-20

## 2023-03-13 MED ORDER — OZEMPIC (0.25 OR 0.5 MG/DOSE) 2 MG/3ML ~~LOC~~ SOPN
0.2500 mg | PEN_INJECTOR | SUBCUTANEOUS | 1 refills | Status: DC
Start: 2023-03-13 — End: 2024-07-20

## 2023-03-13 MED ORDER — FREESTYLE LIBRE 3 SENSOR MISC
1.0000 | 2 refills | Status: DC
Start: 2023-03-13 — End: 2024-07-20

## 2023-03-13 MED ORDER — NIFEDIPINE ER 30 MG PO TB24
30.0000 mg | ORAL_TABLET | Freq: Every day | ORAL | 0 refills | Status: AC
Start: 2023-03-13 — End: ?

## 2023-03-13 MED ORDER — OMEGA-3 FATTY ACIDS 1000 MG PO CAPS
2.0000 g | ORAL_CAPSULE | Freq: Every day | ORAL | 1 refills | Status: DC
Start: 2023-03-13 — End: 2024-07-20

## 2023-03-13 MED ORDER — LANTUS SOLOSTAR 100 UNIT/ML ~~LOC~~ SOPN
15.0000 [IU] | PEN_INJECTOR | Freq: Every day | SUBCUTANEOUS | 1 refills | Status: AC
Start: 2023-03-13 — End: ?

## 2023-03-13 NOTE — Progress Notes (Signed)
Established Patient Office Visit  Subjective   Patient ID: [name], female    DOB: 03/18/1986  Age: 37 y.o. MRN: [id number]  Chief Complaint  Patient presents with   Establish Care   Cough    Has been going on for over a week. Was taking mucinex and it was helping but has stopped helping.    Nasal Congestion    Been going on for about a week.    Ear Pain    Both ears have been itching.     HPI [name] 37 year old African-American female with past medical history of uncontrolled diabetes, hypertension and hypertriglyceridemia.  She has been without her medication for 2 months now and has a new complaint of cough and rhinorrhea that is not relieved with Mucinex. She reports history of allergic rhinitis and reports that her allergies are bit worse this year.  Coughing for 1-week started with HA " I have been taking mucinex and quit working" sputum, nasal discharge are clear"We discussed starting her on Zyrtec and Flonase, she refused Flonase reported that she had nose surgery as a kid she is still traumatized by it and cannot put anything in her nose.  She has history of diabetes currently on Lantus, Ozempic, NovoLog and Ozempic.  A1c today 10.3% as she reports she has been out of her medication for 2 months now.  She is right BKA and has history of necrotizing fasciitis which has been resolved. She she reported that she has been to rehab for necrosis past amputation and is resolving has been on a prosthetic since February.  We will start Ozempic at a lower dose 25 and client will be taking that dose for 4 weeks and increase it to 0.5.  Will start metformin extended release as client reported that she was having GI issues with abnormal metformin, and continue Lantus and NovoLog.  Discussed with client of the goal of her A1c will be 6.5.  She is also prescribed a freestyle libre for better blood glucose monitoring, and advised to decrease carbohydrate intake.  Client reviews foot exam  and says she will get it done at the next visit, she has not seen optometry for 2 years we added her name to the list of new patient to be seen when the diabetes clinic is open this summer and she agreed. That the Ozempic works for her she has been dealing with nausea and vomiting so we will send Zofran for her  She has a diagnosis of hypertension and has been off her medication, we will restart Adalat. BP is in the normal range today. Denies HA, syncope, change in vision or blurry vision  She has a diagnosis of hypertriglyceridemia and was supposed to try over-the-counter fish oil but reports that she has not been taking it "by last provider wanted me to try diet and if no improvement will start me med, but I will never take it" will send fishery to the pharmacy for her and based on the lab results we will determine if medication is needed.  rhinorrhea  RBKA [place] got fited for porstethin since Feb  She denied the use of tobacco, EtOH, illicit drugs LMP 02/27/2023, hCG negative today  Patient Active Problem List   Diagnosis Date Noted   Long-term (current) use of injectable non-insulin antidiabetic drugs 03/13/2023   Diabetes mellitus (HCC) 03/13/2023   High triglycerides 08/08/2022   Essential hypertension 01/21/2022   Insulin dependent type 2 diabetes mellitus (HCC) 01/21/2022   Chronic  pain of right knee 01/13/2022   Nausea 01/13/2022   Type 2 diabetes mellitus with hyperglycemia (HCC) 11/30/2021   Hypokalemia 10/02/2021   Hypoalbuminemia 09/26/2021   Positive for macroalbuminuria 09/26/2021   Acute blood loss anemia 09/22/2021   ATN (acute tubular necrosis) (HCC) 09/22/2021   AKI (acute kidney injury) (HCC) 09/20/2021   Hyponatremia 09/19/2021   Diabetes mellitus type 2, noninsulin dependent (HCC) 09/19/2021   Chronic hypertension 12/16/2020   Missed abortion 12/16/2020   Status post cesarean delivery 12/15/2020   Establishing care with new doctor, encounter for 12/22/2018    Pap smear abnormality of cervix/human papillomavirus (HPV) positive 06/25/2018   BMI 33.0-33.9,adult 03/30/2016   Past Medical History:  Diagnosis Date   Chronic hypertension    12-16-2020  documented in epic since 2017 pt dx hypertension (not during pregnancy);  pt currently is not and has not been taking any bp med,  bp at [facility] 12-15-2020 165/ 104   Missed ab 12/15/2020   Type 2 diabetes mellitus (HCC)    12-16-2020  pt stated only during pregnancy took medication, at [facility] 12-15-2020 blood glucose 353 pt was given metformin bid and pt stated did start taking it   Wears glasses    Past Surgical History:  Procedure Laterality Date   BELOW KNEE LEG AMPUTATION Right    CESAREAN SECTION N/A 12/25/2018   Procedure: CESAREAN SECTION;  Surgeon: Levie Heritage, DO;  Location: [facility] LD ORS;  Service: Obstetrics;  Laterality: N/A;   TONSILLECTOMY AND ADENOIDECTOMY  age 37   Social History   Tobacco Use   Smoking status: Never   Smokeless tobacco: Never  Vaping Use   Vaping Use: Never used  Substance Use Topics   Alcohol use: No   Drug use: Never   Social History   Socioeconomic History   Marital status: Single    Spouse name: Not on file   Number of children: Not on file   Years of education: Not on file   Highest education level: Not on file  Occupational History   Not on file  Tobacco Use   Smoking status: Never   Smokeless tobacco: Never  Vaping Use   Vaping Use: Never used  Substance and Sexual Activity   Alcohol use: No   Drug use: Never   Sexual activity: Yes    Birth control/protection: None  Other Topics Concern   Not on file  Social History Narrative   Not on file   Social Determinants of Health   Financial Resource Strain: Low Risk  (06/20/2018)   Overall Financial Resource Strain (CARDIA)    Difficulty of Paying Living Expenses: Not hard at all  Food Insecurity: No Food Insecurity (03/13/2023)   Hunger Vital Sign    Worried About Running Out of Food in the  Last Year: Never true    Ran Out of Food in the Last Year: Never true  Transportation Needs: No Transportation Needs (03/13/2023)   PRAPARE - Administrator, Civil Service (Medical): No    Lack of Transportation (Non-Medical): No  Physical Activity: Insufficiently Active (06/20/2018)   Exercise Vital Sign    Days of Exercise per Week: 2 days    Minutes of Exercise per Session: 30 min  Stress: No Stress Concern Present (06/20/2018)   [employer] of Occupational Health - Occupational Stress Questionnaire    Feeling of Stress : Not at all  Social Connections: Moderately Integrated (03/13/2023)   Social Connection and Isolation Panel [NHANES]  Frequency of Communication with Friends and Family: More than three times a week    Frequency of Social Gatherings with Friends and Family: Three times a week    Attends Religious Services: More than 4 times per year    Active Member of Clubs or Organizations: Yes    Attends Banker Meetings: 1 to 4 times per year    Marital Status: Never married  Recent Concern: Social Connections - Moderately Isolated (03/13/2023)   Social Connection and Isolation Panel [NHANES]    Frequency of Communication with Friends and Family: More than three times a week    Frequency of Social Gatherings with Friends and Family: Three times a week    Attends Religious Services: More than 4 times per year    Active Member of Clubs or Organizations: No    Attends Banker Meetings: Not on file    Marital Status: Never married  Intimate Partner Violence: Not At Risk (03/13/2023)   Humiliation, Afraid, Rape, and Kick questionnaire    Fear of Current or Ex-Partner: No    Emotionally Abused: No    Physically Abused: No    Sexually Abused: No      ROS Negative unless indicated in HPI   Objective:     BP 122/84   Pulse (!) 114   Temp 98.3 F (36.8 C) (Temporal)   Ht 5\' 7"  (1.702 m)   Wt 191 lb (86.6 kg)   SpO2 98%   BMI 29.91  kg/m  BP Readings from Last 3 Encounters:  03/13/23 122/84  09/27/22 (!) 139/97  02/10/22 122/82   Wt Readings from Last 3 Encounters:  03/13/23 191 lb (86.6 kg)  02/10/22 192 lb (87.1 kg)  01/13/22 236 lb (107 kg)      Physical Exam Vitals and nursing note reviewed.  Constitutional:      Appearance: Normal appearance. She is overweight.  HENT:     Head: Normocephalic and atraumatic.     Nose: Congestion and rhinorrhea present.     Right Sinus: No maxillary sinus tenderness or frontal sinus tenderness.     Left Sinus: No maxillary sinus tenderness or frontal sinus tenderness.  Eyes:     General: No scleral icterus.    Extraocular Movements: Extraocular movements intact.     Conjunctiva/sclera: Conjunctivae normal.     Pupils: Pupils are equal, round, and reactive to light.  Neck:     Vascular: No carotid bruit.  Cardiovascular:     Rate and Rhythm: Normal rate.     Heart sounds: Normal heart sounds.  Pulmonary:     Effort: Pulmonary effort is normal.     Breath sounds: Normal breath sounds.  Abdominal:     General: Bowel sounds are normal.     Palpations: Abdomen is soft.     Tenderness: There is no abdominal tenderness.     Hernia: No hernia is present.  Musculoskeletal:        General: Normal range of motion.     Cervical back: Normal range of motion and neck supple.     Left lower leg: No edema.     Comments: RBKA  Lymphadenopathy:     Cervical: No cervical adenopathy.  Skin:    General: Skin is warm and dry.     Findings: No lesion or rash.  Neurological:     General: No focal deficit present.     Mental Status: She is alert and oriented to person, place, and time. Mental  status is at baseline.  Psychiatric:        Mood and Affect: Mood normal.        Behavior: Behavior normal.        Thought Content: Thought content normal.        Judgment: Judgment normal.      No results found for any visits on 03/13/23.  Last CBC Lab Results  Component Value  Date   WBC 8.3 08/08/2022   HGB 13.1 08/08/2022   HCT 39.3 08/08/2022   MCV 88 08/08/2022   MCH 29.2 08/08/2022   RDW 13.2 08/08/2022   PLT 475 (H) 08/08/2022   Last metabolic panel Lab Results  Component Value Date   GLUCOSE 68 (L) 08/08/2022   NA 140 08/08/2022   K 4.2 08/08/2022   CL 104 08/08/2022   CO2 22 08/08/2022   BUN 20 08/08/2022   CREATININE 0.79 08/08/2022   EGFR 101 08/08/2022   CALCIUM 9.1 08/08/2022   PROT 6.8 08/08/2022   ALBUMIN 3.5 (L) 08/08/2022   LABGLOB 3.3 08/08/2022   AGRATIO 1.1 (L) 08/08/2022   BILITOT 0.4 08/08/2022   ALKPHOS 59 08/08/2022   AST 12 08/08/2022   ALT 9 08/08/2022   ANIONGAP 7 12/15/2020   Last lipids Lab Results  Component Value Date   CHOL 200 (H) 08/08/2022   HDL 34 (L) 08/08/2022   LDLCALC 124 (H) 08/08/2022   TRIG 235 (H) 08/08/2022   CHOLHDL 5.9 (H) 08/08/2022   Last hemoglobin A1c Lab Results  Component Value Date   HGBA1C 6.9 (H) 08/08/2022   Last thyroid functions Lab Results  Component Value Date   TSH 1.450 06/20/2018        Assessment & Plan:  Type 2 diabetes mellitus with hyperglycemia, with long-term current use of insulin (HCC) -     CMP14+EGFR -     Bayer DCA Hb A1c Waived -     Microalbumin / creatinine urine ratio -     FreeStyle Libre 3 Sensor; 1 Box by Does not apply route every 14 (fourteen) days. Place 1 sensor on the skin every 14 days. Use to check glucose continuously  Dispense: 2 each; Refill: 2 -     metFORMIN HCl ER; Take 1 tablet (500 mg total) by mouth daily with breakfast.  Dispense: 90 tablet; Refill: 0 -     Lantus SoloStar; Inject 15 Units into the skin at bedtime. Patient is taking only 10 unit  Dispense: 15 mL; Refill: 1 -     NovoLOG FlexPen ReliOn; Inject 5 Units into the skin 3 (three) times daily with meals. Also can use sliding scale if needed. Patient is taking 3 U at dinner and 5 unit Breakfast and lunch  Dispense: 15 mL; Refill: 1 -     Ozempic (0.25 or 0.5 MG/DOSE);  Inject 0.25 mg into the skin every 7 (seven) days.  Dispense: 3 mL; Refill: 1  Long-term (current) use of injectable non-insulin antidiabetic drugs  Type 2 diabetes mellitus with other skin complication, with long-term current use of insulin (HCC) -     Microalbumin / creatinine urine ratio -     FreeStyle Libre 3 Sensor; 1 Box by Does not apply route every 14 (fourteen) days. Place 1 sensor on the skin every 14 days. Use to check glucose continuously  Dispense: 2 each; Refill: 2 -     metFORMIN HCl ER; Take 1 tablet (500 mg total) by mouth daily with breakfast.  Dispense: 90 tablet; Refill:  0 -     Lantus SoloStar; Inject 15 Units into the skin at bedtime. Patient is taking only 10 unit  Dispense: 15 mL; Refill: 1 -     NovoLOG FlexPen ReliOn; Inject 5 Units into the skin 3 (three) times daily with meals. Also can use sliding scale if needed. Patient is taking 3 U at dinner and 5 unit Breakfast and lunch  Dispense: 15 mL; Refill: 1 -     Ozempic (0.25 or 0.5 MG/DOSE); Inject 0.25 mg into the skin every 7 (seven) days.  Dispense: 3 mL; Refill: 1  Type II diabetes mellitus with peripheral circulatory disorder (HCC)  High triglycerides -     CMP14+EGFR -     Lipid panel -     Thyroid Panel With TSH -     Omega-3 Fatty Acids; Take 2 capsules (2 g total) by mouth daily.  Dispense: 90 capsule; Refill: 1  Essential hypertension -     CBC with Differential/Platelet -     CMP14+EGFR -     Thyroid Panel With TSH -     Pregnancy, urine -     NIFEdipine ER; Take 1 tablet (30 mg total) by mouth daily.  Dispense: 90 tablet; Refill: 0  Chronic hypertension -     NIFEdipine ER; Take 1 tablet (30 mg total) by mouth daily.  Dispense: 90 tablet; Refill: 0 -     Ozempic (0.25 or 0.5 MG/DOSE); Inject 0.25 mg into the skin every 7 (seven) days.  Dispense: 3 mL; Refill: 1  Seasonal allergic rhinitis, unspecified trigger -     Cetirizine HCl; Take 1 tablet (10 mg total) by mouth daily.  Dispense: 90 tablet;  Refill: 0  Nausea -     metFORMIN HCl ER; Take 1 tablet (500 mg total) by mouth daily with breakfast.  Dispense: 90 tablet; Refill: 0 -     Ozempic (0.25 or 0.5 MG/DOSE); Inject 0.25 mg into the skin every 7 (seven) days.  Dispense: 3 mL; Refill: 1 -     Ondansetron HCl; Take 1 tablet (4 mg total) by mouth every 8 (eight) hours as needed for nausea or vomiting.  Dispense: 20 tablet; Refill: 3   Uncontrolled DM 2 with A1c of 10.3, restart Ozempic 1.5 for 4 weeks and increase it to .50 Continue NovoLog 5 units 3 times a day with meals  Continue Lantus 15 unit at bedtime Zofran 4 mg 3 times daily as needed for nausea and vomiting Continuous blood glucose monitor freestyle libre sent to client pharmacy, client is instructed to come to the clinic is on around the heart to use it so we can do education - add her to be schedule to diabetic eye exam when the clinic open in a few months Hypertension restart Nifedine 30 mg 1-tab daily  Allergic rhinitis take Zyrtec 10 mg 1 tab daily  Diabetic foot exam will be performed at her next appointment A1c goal 6.5, client to work on carbs control, diet and exercise  Return in about 3 months (around 06/13/2023) for follow-up.   [address] Santa Lighter DNP

## 2023-03-14 LAB — CMP14+EGFR
ALT: 20 IU/L (ref 0–32)
AST: 15 IU/L (ref 0–40)
Albumin/Globulin Ratio: 0.7
Albumin: 3 g/dL — ABNORMAL LOW (ref 3.9–4.9)
Alkaline Phosphatase: 167 IU/L — ABNORMAL HIGH (ref 44–121)
BUN/Creatinine Ratio: 17 (ref 9–23)
BUN: 17 mg/dL (ref 6–20)
Bilirubin Total: 0.4 mg/dL (ref 0.0–1.2)
CO2: 24 mmol/L (ref 20–29)
Calcium: 9 mg/dL (ref 8.7–10.2)
Chloride: 96 mmol/L (ref 96–106)
Creatinine, Ser: 1.02 mg/dL — ABNORMAL HIGH (ref 0.57–1.00)
Globulin, Total: 4.5 g/dL (ref 1.5–4.5)
Glucose: 384 mg/dL — ABNORMAL HIGH (ref 70–99)
Potassium: 4.5 mmol/L (ref 3.5–5.2)
Sodium: 136 mmol/L (ref 134–144)
Total Protein: 7.5 g/dL (ref 6.0–8.5)
eGFR: 74 mL/min/{1.73_m2} (ref >59)

## 2023-03-14 LAB — LIPID PANEL
Chol/HDL Ratio: 6.2 ratio — ABNORMAL HIGH (ref 0.0–4.4)
Cholesterol, Total: 193 mg/dL (ref 100–199)
HDL: 31 mg/dL — ABNORMAL LOW (ref >39)
LDL Chol Calc (NIH): 118 mg/dL — ABNORMAL HIGH (ref 0–99)
Triglycerides: 251 mg/dL — ABNORMAL HIGH (ref 0–149)
VLDL Cholesterol Cal: 44 mg/dL — ABNORMAL HIGH (ref 5–40)

## 2023-03-14 LAB — CBC WITH DIFFERENTIAL/PLATELET
Basophils Absolute: 0.1 10*3/uL (ref 0.0–0.2)
Basos: 1 %
EOS (ABSOLUTE): 0.3 10*3/uL (ref 0.0–0.4)
Eos: 2 %
Hematocrit: 36.4 % (ref 34.0–46.6)
Hemoglobin: 12.4 g/dL (ref 11.1–15.9)
Immature Grans (Abs): 0.1 10*3/uL (ref 0.0–0.1)
Immature Granulocytes: 1 %
Lymphocytes Absolute: 2 10*3/uL (ref 0.7–3.1)
Lymphs: 13 %
MCH: 29.5 pg (ref 26.6–33.0)
MCHC: 34.1 g/dL (ref 31.5–35.7)
MCV: 87 fL (ref 79–97)
Monocytes Absolute: 1 10*3/uL — ABNORMAL HIGH (ref 0.1–0.9)
Monocytes: 6 %
Neutrophils Absolute: 11.8 10*3/uL — ABNORMAL HIGH (ref 1.4–7.0)
Neutrophils: 77 %
Platelets: 421 10*3/uL (ref 150–450)
RBC: 4.21 x10E6/uL (ref 3.77–5.28)
RDW: 11.3 % — ABNORMAL LOW (ref 11.7–15.4)
WBC: 15.2 10*3/uL — ABNORMAL HIGH (ref 3.4–10.8)

## 2023-03-14 LAB — THYROID PANEL WITH TSH
Free Thyroxine Index: 2.6 (ref 1.2–4.9)
T3 Uptake Ratio: 35 % (ref 24–39)
T4, Total: 7.3 ug/dL (ref 4.5–12.0)
TSH: 1.16 u[IU]/mL (ref 0.450–4.500)

## 2023-03-14 LAB — MICROALBUMIN / CREATININE URINE RATIO
Creatinine, Urine: 88.1 mg/dL
Microalb/Creat Ratio: 4170 mg/g creat — ABNORMAL HIGH (ref 0–29)
Microalbumin, Urine: 3673.5 ug/mL

## 2023-03-15 ENCOUNTER — Emergency Department (HOSPITAL_COMMUNITY): Payer: Medicaid Other

## 2023-03-15 ENCOUNTER — Inpatient Hospital Stay (HOSPITAL_COMMUNITY)
Admission: EM | Admit: 2023-03-15 | Discharge: 2023-03-18 | DRG: 073 | Disposition: A | Discharge status: Home / Self Care | Payer: Medicaid Other | Attending: Internal Medicine | Admitting: Internal Medicine

## 2023-03-15 ENCOUNTER — Encounter: Payer: Self-pay | Admitting: Nurse Practitioner

## 2023-03-15 ENCOUNTER — Encounter (HOSPITAL_COMMUNITY): Payer: Self-pay | Admitting: Emergency Medicine

## 2023-03-15 ENCOUNTER — Other Ambulatory Visit: Payer: Self-pay

## 2023-03-15 ENCOUNTER — Other Ambulatory Visit: Payer: Self-pay | Admitting: Nurse Practitioner

## 2023-03-15 ENCOUNTER — Telehealth: Payer: Self-pay | Admitting: Nurse Practitioner

## 2023-03-15 DIAGNOSIS — N179 Acute kidney failure, unspecified: Secondary | ICD-10-CM | POA: Diagnosis present

## 2023-03-15 DIAGNOSIS — E860 Dehydration: Secondary | ICD-10-CM | POA: Diagnosis present

## 2023-03-15 DIAGNOSIS — Z8249 Family history of ischemic heart disease and other diseases of the circulatory system: Secondary | ICD-10-CM

## 2023-03-15 DIAGNOSIS — E669 Obesity, unspecified: Secondary | ICD-10-CM | POA: Diagnosis present

## 2023-03-15 DIAGNOSIS — E119 Type 2 diabetes mellitus without complications: Secondary | ICD-10-CM

## 2023-03-15 DIAGNOSIS — Z794 Long term (current) use of insulin: Secondary | ICD-10-CM | POA: Diagnosis not present

## 2023-03-15 DIAGNOSIS — I10 Essential (primary) hypertension: Secondary | ICD-10-CM | POA: Diagnosis present

## 2023-03-15 DIAGNOSIS — J181 Lobar pneumonia, unspecified organism: Secondary | ICD-10-CM | POA: Diagnosis present

## 2023-03-15 DIAGNOSIS — Z6833 Body mass index (BMI) 33.0-33.9, adult: Secondary | ICD-10-CM | POA: Diagnosis not present

## 2023-03-15 DIAGNOSIS — J189 Pneumonia, unspecified organism: Secondary | ICD-10-CM

## 2023-03-15 DIAGNOSIS — K3184 Gastroparesis: Secondary | ICD-10-CM | POA: Diagnosis present

## 2023-03-15 DIAGNOSIS — E1165 Type 2 diabetes mellitus with hyperglycemia: Secondary | ICD-10-CM

## 2023-03-15 DIAGNOSIS — Z79899 Other long term (current) drug therapy: Secondary | ICD-10-CM

## 2023-03-15 DIAGNOSIS — E781 Pure hyperglyceridemia: Secondary | ICD-10-CM

## 2023-03-15 DIAGNOSIS — E876 Hypokalemia: Secondary | ICD-10-CM | POA: Diagnosis present

## 2023-03-15 DIAGNOSIS — Z7984 Long term (current) use of oral hypoglycemic drugs: Secondary | ICD-10-CM

## 2023-03-15 DIAGNOSIS — E1129 Type 2 diabetes mellitus with other diabetic kidney complication: Secondary | ICD-10-CM | POA: Insufficient documentation

## 2023-03-15 DIAGNOSIS — E8889 Other specified metabolic disorders: Secondary | ICD-10-CM | POA: Diagnosis present

## 2023-03-15 DIAGNOSIS — E871 Hypo-osmolality and hyponatremia: Secondary | ICD-10-CM | POA: Diagnosis present

## 2023-03-15 DIAGNOSIS — Z91030 Bee allergy status: Secondary | ICD-10-CM

## 2023-03-15 DIAGNOSIS — Z89511 Acquired absence of right leg below knee: Secondary | ICD-10-CM | POA: Diagnosis not present

## 2023-03-15 DIAGNOSIS — Z91018 Allergy to other foods: Secondary | ICD-10-CM | POA: Diagnosis not present

## 2023-03-15 DIAGNOSIS — E1143 Type 2 diabetes mellitus with diabetic autonomic (poly)neuropathy: Principal | ICD-10-CM | POA: Diagnosis present

## 2023-03-15 DIAGNOSIS — Z716 Tobacco abuse counseling: Secondary | ICD-10-CM | POA: Diagnosis not present

## 2023-03-15 DIAGNOSIS — R112 Nausea with vomiting, unspecified: Secondary | ICD-10-CM | POA: Diagnosis present

## 2023-03-15 DIAGNOSIS — Z833 Family history of diabetes mellitus: Secondary | ICD-10-CM | POA: Diagnosis not present

## 2023-03-15 LAB — CBC WITH DIFFERENTIAL/PLATELET
Abs Immature Granulocytes: 1.5 10*3/uL — ABNORMAL HIGH (ref 0.00–0.07)
Basophils Absolute: 0.1 10*3/uL (ref 0.0–0.1)
Basophils Relative: 0 %
Eosinophils Absolute: 0 10*3/uL (ref 0.0–0.5)
Eosinophils Relative: 0 %
HCT: 36.6 % (ref 36.0–46.0)
Hemoglobin: 12.1 g/dL (ref 12.0–15.0)
Immature Granulocytes: 4 %
Lymphocytes Relative: 3 %
Lymphs Abs: 1.2 10*3/uL (ref 0.7–4.0)
MCH: 29.3 pg (ref 26.0–34.0)
MCHC: 33.1 g/dL (ref 30.0–36.0)
MCV: 88.6 fL (ref 80.0–100.0)
Monocytes Absolute: 0.5 10*3/uL (ref 0.1–1.0)
Monocytes Relative: 1 %
Neutro Abs: 35.2 10*3/uL — ABNORMAL HIGH (ref 1.7–7.7)
Neutrophils Relative %: 92 %
Platelets: 503 10*3/uL — ABNORMAL HIGH (ref 150–400)
RBC: 4.13 MIL/uL (ref 3.87–5.11)
RDW: 12.3 % (ref 11.5–15.5)
WBC: 38.6 10*3/uL — ABNORMAL HIGH (ref 4.0–10.5)
nRBC: 0 % (ref 0.0–0.2)

## 2023-03-15 LAB — COMPREHENSIVE METABOLIC PANEL
ALT: 15 U/L (ref 0–44)
AST: 13 U/L — ABNORMAL LOW (ref 15–41)
Albumin: 1.7 g/dL — ABNORMAL LOW (ref 3.5–5.0)
Alkaline Phosphatase: 148 U/L — ABNORMAL HIGH (ref 38–126)
Anion gap: 13 (ref 5–15)
BUN: 24 mg/dL — ABNORMAL HIGH (ref 6–20)
CO2: 22 mmol/L (ref 22–32)
Calcium: 8.4 mg/dL — ABNORMAL LOW (ref 8.9–10.3)
Chloride: 95 mmol/L — ABNORMAL LOW (ref 98–111)
Creatinine, Ser: 1.24 mg/dL — ABNORMAL HIGH (ref 0.44–1.00)
GFR, Estimated: 58 mL/min — ABNORMAL LOW (ref >60)
Glucose, Bld: 299 mg/dL — ABNORMAL HIGH (ref 70–99)
Potassium: 3.9 mmol/L (ref 3.5–5.1)
Sodium: 130 mmol/L — ABNORMAL LOW (ref 135–145)
Total Bilirubin: 0.6 mg/dL (ref 0.3–1.2)
Total Protein: 8.1 g/dL (ref 6.5–8.1)

## 2023-03-15 LAB — POC URINE PREG, ED: Preg Test, Ur: NEGATIVE

## 2023-03-15 LAB — I-STAT VENOUS BLOOD GAS, ED
Acid-Base Excess: 0 mmol/L (ref 0.0–2.0)
Bicarbonate: 23.4 mmol/L (ref 20.0–28.0)
Calcium, Ion: 1.04 mmol/L — ABNORMAL LOW (ref 1.15–1.40)
HCT: 35 % — ABNORMAL LOW (ref 36.0–46.0)
Hemoglobin: 11.9 g/dL — ABNORMAL LOW (ref 12.0–15.0)
O2 Saturation: 96 %
Potassium: 3.9 mmol/L (ref 3.5–5.1)
Sodium: 133 mmol/L — ABNORMAL LOW (ref 135–145)
TCO2: 24 mmol/L (ref 22–32)
pCO2, Ven: 31.5 mmHg — ABNORMAL LOW (ref 44–60)
pH, Ven: 7.478 — ABNORMAL HIGH (ref 7.25–7.43)
pO2, Ven: 73 mmHg — ABNORMAL HIGH (ref 32–45)

## 2023-03-15 LAB — URINALYSIS, ROUTINE W REFLEX MICROSCOPIC
Bilirubin Urine: NEGATIVE
Glucose, UA: >=500 mg/dL — AB
Ketones, ur: 15 mg/dL — AB
Leukocytes,Ua: NEGATIVE
Nitrite: NEGATIVE
Protein, ur: >300 mg/dL — AB
Specific Gravity, Urine: 1.025 (ref 1.005–1.030)
pH: 6 (ref 5.0–8.0)

## 2023-03-15 LAB — URINALYSIS, MICROSCOPIC (REFLEX)

## 2023-03-15 LAB — LACTIC ACID, PLASMA: Lactic Acid, Venous: 1.1 mmol/L (ref 0.5–1.9)

## 2023-03-15 LAB — LIPASE, BLOOD: Lipase: 19 U/L (ref 11–51)

## 2023-03-15 LAB — CBG MONITORING, ED: Glucose-Capillary: 294 mg/dL — ABNORMAL HIGH (ref 70–99)

## 2023-03-15 MED ORDER — DROPERIDOL 2.5 MG/ML IJ SOLN
2.5000 mg | Freq: Once | INTRAMUSCULAR | Status: AC
  Administered 2023-03-15: 2.5 mg via INTRAVENOUS
  Filled 2023-03-15: qty 2

## 2023-03-15 MED ORDER — ONDANSETRON HCL 4 MG/2ML IJ SOLN
4.0000 mg | Freq: Four times a day (QID) | INTRAMUSCULAR | Status: DC | PRN
  Administered 2023-03-16 – 2023-03-17 (×3): 4 mg via INTRAVENOUS
  Filled 2023-03-15 (×3): qty 2

## 2023-03-15 MED ORDER — KERENDIA 10 MG PO TABS
10.0000 mg | ORAL_TABLET | Freq: Every day | ORAL | 0 refills | Status: DC
Start: 2023-03-15 — End: 2023-03-18

## 2023-03-15 MED ORDER — SODIUM CHLORIDE 0.9 % IV SOLN
500.0000 mg | INTRAVENOUS | Status: DC
  Administered 2023-03-16 – 2023-03-17 (×2): 500 mg via INTRAVENOUS
  Filled 2023-03-15 (×3): qty 5

## 2023-03-15 MED ORDER — LACTATED RINGERS IV SOLN: INTRAVENOUS | Status: DC

## 2023-03-15 MED ORDER — ENOXAPARIN SODIUM 40 MG/0.4ML IJ SOSY
40.0000 mg | PREFILLED_SYRINGE | INTRAMUSCULAR | Status: DC
  Administered 2023-03-16 – 2023-03-17 (×2): 40 mg via SUBCUTANEOUS
  Filled 2023-03-15 (×2): qty 0.4

## 2023-03-15 MED ORDER — SODIUM CHLORIDE 0.9 % IV SOLN
1.0000 g | Freq: Once | INTRAVENOUS | Status: AC
  Administered 2023-03-15: 1 g via INTRAVENOUS
  Filled 2023-03-15: qty 10

## 2023-03-15 MED ORDER — INSULIN ASPART 100 UNIT/ML IJ SOLN
0.0000 [IU] | Freq: Every day | INTRAMUSCULAR | Status: DC
  Administered 2023-03-16: 3 [IU] via SUBCUTANEOUS

## 2023-03-15 MED ORDER — ONDANSETRON HCL 4 MG PO TABS: 4.0000 mg | ORAL_TABLET | Freq: Four times a day (QID) | ORAL | Status: DC | PRN

## 2023-03-15 MED ORDER — MORPHINE SULFATE (PF) 2 MG/ML IV SOLN: 2.0000 mg | INTRAVENOUS | Status: DC | PRN

## 2023-03-15 MED ORDER — ATORVASTATIN CALCIUM 10 MG PO TABS
10.0000 mg | ORAL_TABLET | Freq: Every day | ORAL | 0 refills | Status: DC
Start: 2023-03-15 — End: 2024-07-20

## 2023-03-15 MED ORDER — ONDANSETRON HCL 4 MG/2ML IJ SOLN
4.0000 mg | Freq: Once | INTRAMUSCULAR | Status: AC
  Administered 2023-03-15: 4 mg via INTRAVENOUS
  Filled 2023-03-15: qty 2

## 2023-03-15 MED ORDER — LACTATED RINGERS IV BOLUS
1000.0000 mL | Freq: Once | INTRAVENOUS | Status: AC
  Administered 2023-03-15: 1000 mL via INTRAVENOUS

## 2023-03-15 MED ORDER — SODIUM CHLORIDE 0.9 % IV SOLN: 1.0000 g | INTRAVENOUS | Status: DC

## 2023-03-15 MED ORDER — INSULIN ASPART 100 UNIT/ML IJ SOLN
0.0000 [IU] | Freq: Three times a day (TID) | INTRAMUSCULAR | Status: DC
  Administered 2023-03-16 (×2): 5 [IU] via SUBCUTANEOUS
  Administered 2023-03-17 (×2): 3 [IU] via SUBCUTANEOUS

## 2023-03-15 MED ORDER — SODIUM CHLORIDE 0.9 % IV SOLN
500.0000 mg | Freq: Once | INTRAVENOUS | Status: AC
  Administered 2023-03-15: 500 mg via INTRAVENOUS
  Filled 2023-03-15: qty 5

## 2023-03-15 NOTE — Progress Notes (Signed)
Due to client's recent lab results, currently 10 mg 1 tab daily and Lipitor 10 mg 1 tab daily will be added medication management.  Try to call her to inform her of the changes she did not pick up left voicemail

## 2023-03-15 NOTE — ED Triage Notes (Addendum)
Pt BIB EMS for nausea vomiting and weakness x days. Dx with pneumonia 3 days ago. Given fluids during visit and now has swelling to R stump. R BKA. EMS VS: 162/92 HR 116 99% RA CBG 334

## 2023-03-15 NOTE — Progress Notes (Signed)
Patient r/ c °

## 2023-03-15 NOTE — Telephone Encounter (Signed)
New medications sent to her pahrmacy

## 2023-03-15 NOTE — ED Provider Notes (Signed)
[place] EMERGENCY DEPARTMENT AT [facility] Provider Note   HPI: [name] is a 37 year old female with a past medical history as below presenting today due to nausea and vomiting.  She reports she was recently diagnosed with pneumonia.  She reports this was 3 days ago.  She was started on antibiotics that were oral and reports difficult time tolerating p.o. intake.  She has not been able to tolerate her medications.  She denies any abdominal pain with the symptoms.  She has not had diarrhea.  Due to inability to tolerate her antibiotics or oral intake she presented to the emergency department.  She denies chest pain or shortness of breath.  She denies any increased work of breathing.  She has not been having fevers or chills at home.  She reports usually Phenergan works for her nausea vomiting however it has not been working for this episode.  Past Medical History:  Diagnosis Date   Chronic hypertension    12-16-2020  documented in epic since 2017 pt dx hypertension (not during pregnancy);  pt currently is not and has not been taking any bp med,  bp at [facility] 12-15-2020 165/ 104   Missed ab 12/15/2020   Type 2 diabetes mellitus (HCC)    12-16-2020  pt stated only during pregnancy took medication, at [facility] 12-15-2020 blood glucose 353 pt was given metformin bid and pt stated did start taking it   Wears glasses     Past Surgical History:  Procedure Laterality Date   BELOW KNEE LEG AMPUTATION Right    CESAREAN SECTION N/A 12/25/2018   Procedure: CESAREAN SECTION;  Surgeon: Levie Heritage, DO;  Location: [facility] LD [facility]S;  Service: Obstetrics;  Laterality: N/A;   TONSILLECTOMY AND ADENOIDECTOMY  age 89     Social History   Tobacco Use   Smoking status: Never   Smokeless tobacco: Never  Vaping Use   Vaping Use: Never used  Substance Use Topics   Alcohol use: No   Drug use: Never      Review of Systems  A complete ROS was performed with pertinent positives/negatives noted  in the HPI.   Vitals:   03/15/23 2200 03/15/23 2224  BP: (!) 169/147   Pulse: (!) 122   Resp:  (!) 24  Temp:    SpO2: 99%     Physical Exam Vitals and nursing note reviewed.  Constitutional:      General: She is not in acute distress.    Appearance: She is well-developed. She is ill-appearing.  HENT:     Head: Normocephalic and atraumatic.     Mouth/Throat:     Mouth: Mucous membranes are dry.  Cardiovascular:     Rate and Rhythm: Regular rhythm. Tachycardia present.     Pulses: Normal pulses.     Heart sounds: Normal heart sounds. No murmur heard.    No friction rub. No gallop.  Pulmonary:     Effort: Pulmonary effort is normal. No respiratory distress.     Breath sounds: No stridor. Rhonchi present. No wheezing or rales.  Abdominal:     General: Abdomen is flat. There is no distension.     Palpations: Abdomen is soft.     Tenderness: There is no abdominal tenderness. There is no guarding or rebound.  Musculoskeletal:     Cervical back: Neck supple.  Skin:    General: Skin is warm and dry.     Capillary Refill: Capillary refill takes 2 to 3 seconds.  Neurological:     Mental Status: She is alert.     Procedures  MDM:  Imaging/radiology results:  DG Chest 2 View  Result Date: 03/15/2023 CLINICAL DATA:  Pneumonia, cough EXAM: CHEST - 2 VIEW COMPARISON:  Previous studies including the examination of 03/13/2023 FINDINGS: There is patchy infiltrate in right upper lung field with no significant change. Cardiac size is within normal limits. There are no signs of pulmonary edema or new focal infiltrates. There is no pleural effusion or pneumothorax. IMPRESSION: There is small patchy infiltrate in right upper lung field suggesting pneumonia. There are no new infiltrates or signs of pulmonary edema. There is no pleural effusion. Electronically Signed   By: Ernie Avena M.D.   On: 03/15/2023 19:15     Lab results:  Results for orders placed or performed during the  hospital encounter of 03/15/23 (from the past 24 hour(s))  Urinalysis, Routine w reflex microscopic -Urine, Clean Catch     Status: Abnormal   Collection Time: 03/15/23  5:48 PM  Result Value Ref Range   Color, Urine YELLOW YELLOW   APPearance HAZY (A) CLEAR   Specific Gravity, Urine 1.025 1.005 - 1.030   pH 6.0 5.0 - 8.0   Glucose, UA >=500 (A) NEGATIVE mg/dL   Hgb urine dipstick MODERATE (A) NEGATIVE   Bilirubin Urine NEGATIVE NEGATIVE   Ketones, ur 15 (A) NEGATIVE mg/dL   Protein, ur >161 (A) NEGATIVE mg/dL   Nitrite NEGATIVE NEGATIVE   Leukocytes,Ua NEGATIVE NEGATIVE  Urinalysis, Microscopic (reflex)     Status: Abnormal   Collection Time: 03/15/23  5:48 PM  Result Value Ref Range   RBC / HPF 6-10 0 - 5 RBC/hpf   WBC, UA 6-10 0 - 5 WBC/hpf   Bacteria, UA RARE (A) NONE SEEN   Squamous Epithelial / HPF 11-20 0 - 5 /HPF   Mucus PRESENT   CBC with Differential     Status: Abnormal   Collection Time: 03/15/23  6:23 PM  Result Value Ref Range   WBC 38.6 (H) 4.0 - 10.5 K/uL   RBC 4.13 3.87 - 5.11 MIL/uL   Hemoglobin 12.1 12.0 - 15.0 g/dL   HCT 09.6 04.5 - 40.9 %   MCV 88.6 80.0 - 100.0 fL   MCH 29.3 26.0 - 34.0 pg   MCHC 33.1 30.0 - 36.0 g/dL   RDW 81.1 91.4 - 78.2 %   Platelets 503 (H) 150 - 400 K/uL   nRBC 0.0 0.0 - 0.2 %   Neutrophils Relative % 92 %   Neutro Abs 35.2 (H) 1.7 - 7.7 K/uL   Lymphocytes Relative 3 %   Lymphs Abs 1.2 0.7 - 4.0 K/uL   Monocytes Relative 1 %   Monocytes Absolute 0.5 0.1 - 1.0 K/uL   Eosinophils Relative 0 %   Eosinophils Absolute 0.0 0.0 - 0.5 K/uL   Basophils Relative 0 %   Basophils Absolute 0.1 0.0 - 0.1 K/uL   WBC Morphology MORPHOLOGY UNREMARKABLE    RBC Morphology MORPHOLOGY UNREMARKABLE    Smear Review MORPHOLOGY UNREMARKABLE    Immature Granulocytes 4 %   Abs Immature Granulocytes 1.50 (H) 0.00 - 0.07 K/uL  Comprehensive metabolic panel     Status: Abnormal   Collection Time: 03/15/23  6:23 PM  Result Value Ref Range   Sodium  130 (L) 135 - 145 mmol/L   Potassium 3.9 3.5 - 5.1 mmol/L   Chloride 95 (L) 98 - 111 mmol/L   CO2 22 22 - 32  mmol/L   Glucose, Bld 299 (H) 70 - 99 mg/dL   BUN 24 (H) 6 - 20 mg/dL   Creatinine, Ser 1.61 (H) 0.44 - 1.00 mg/dL   Calcium 8.4 (L) 8.9 - 10.3 mg/dL   Total Protein 8.1 6.5 - 8.1 g/dL   Albumin 1.7 (L) 3.5 - 5.0 g/dL   AST 13 (L) 15 - 41 U/L   ALT 15 0 - 44 U/L   Alkaline Phosphatase 148 (H) 38 - 126 U/L   Total Bilirubin 0.6 0.3 - 1.2 mg/dL   GFR, Estimated 58 (L) >60 mL/min   Anion gap 13 5 - 15  Lipase, blood     Status: None   Collection Time: 03/15/23  6:23 PM  Result Value Ref Range   Lipase 19 11 - 51 U/L  POC urine preg, ED     Status: None   Collection Time: 03/15/23  6:29 PM  Result Value Ref Range   Preg Test, Ur NEGATIVE NEGATIVE  I-Stat venous blood gas, ([facility] ED, [facility], [facility])     Status: Abnormal   Collection Time: 03/15/23  9:45 PM  Result Value Ref Range   pH, Ven 7.478 (H) 7.25 - 7.43   pCO2, Ven 31.5 (L) 44 - 60 mmHg   pO2, Ven 73 (H) 32 - 45 mmHg   Bicarbonate 23.4 20.0 - 28.0 mmol/L   TCO2 24 22 - 32 mmol/L   O2 Saturation 96 %   Acid-Base Excess 0.0 0.0 - 2.0 mmol/L   Sodium 133 (L) 135 - 145 mmol/L   Potassium 3.9 3.5 - 5.1 mmol/L   Calcium, Ion 1.04 (L) 1.15 - 1.40 mmol/L   HCT 35.0 (L) 36.0 - 46.0 %   Hemoglobin 11.9 (L) 12.0 - 15.0 g/dL   Sample type VENOUS      Key medications administered in the ER:  Medications  ondansetron (ZOFRAN) injection 4 mg (4 mg Intravenous Given 03/15/23 1930)  lactated ringers bolus 1,000 mL (1,000 mLs Intravenous Bolus 03/15/23 1932)  cefTRIAXone (ROCEPHIN) 1 g in sodium chloride 0.9 % 100 mL IVPB (0 g Intravenous Stopped 03/15/23 2129)  azithromycin (ZITHROMAX) 500 mg in sodium chloride 0.9 % 250 mL IVPB (0 mg Intravenous Stopped 03/15/23 2303)  ondansetron (ZOFRAN) injection 4 mg (4 mg Intravenous Given 03/15/23 2041)  droperidol (INAPSINE) 2.5 MG/ML injection 2.5 mg (2.5 mg Intravenous Given 03/15/23 2134)      Medical decision making: -Vital signs stable. Patient afebrile, hemodynamically stable, and non-toxic appearing. -Patient's presentation is most consistent with acute presentation with potential threat to life or bodily function.. [name] is a 37 y.o. female presenting to the emergency department with emesis.  -Additional history obtained from patient's family member at bedside who reports nothing like this has happened before. -Per chart review, patient has a history of type 2 diabetes that is insulin-dependent. -The patient is presenting today with emesis.  She does not have severe abdominal pain, abdominal exam without any tenderness.  Do not suspect acute intra-abdominal process at this time.  Patient is tachycardic but afebrile.  Labs with leukocytosis.  Chest x-ray my interpretation with right upper lobe pneumonia.  Sepsis protocol initiated including lactic acid, blood cultures, and empiric antibiotics.  Given no pneumonia patient given Rocephin and azithromycin.  She does not have an oxygen requirement and is without tachypnea.  Patient given fluids and Zofran.  Patient does not have improvement in her nausea and vomiting upon reassessment.  Given droperidol this time.  Pregnancy test is  negative, do not suspect ectopic or intrauterine pregnancy/hyperemesis gravidarum.  Patient is hyperglycemic with ketonuria on UA, blood gas is normal do not suspect DKA.  Ketonuria likely secondary to starvation ketosis.  UA is without evidence of UTI.  Labs show evidence of AKI.  Given patient is unable to tolerate p.o. intake I believe she will need admission for intractable emesis for continued treatment of her pneumonia.  I have discussed the patient with medicine will admit her to their service.   Medical Decision Making Amount and/or Complexity of Data Reviewed Labs: ordered. Decision-making details documented in ED Course. Radiology: ordered and independent interpretation performed.  Decision-making details documented in ED Course.  Risk Prescription drug management. Decision regarding hospitalization.     The plan for this patient was discussed with Dr. Jeraldine Loots, who voiced agreement and who oversaw evaluation and treatment of this patient.  Marta Lamas, MD Emergency Medicine, PGY-3  Note: Dragon medical dictation software was used in the creation of this note.   Clinical Impression:  1. AKI (acute kidney injury) (HCC)   2. Community acquired pneumonia of right upper lobe of lung          Chase Caller, MD 03/15/23 2311    Gerhard Munch, MD 03/15/23 [phone]

## 2023-03-15 NOTE — ED Notes (Signed)
ED TO INPATIENT HANDOFF REPORT  ED Nurse Name and Phone #: Amil Amen [phone]  S Name/Age/Gender [name] 37 y.o. female Room/Bed: 020C/020C  Code Status   Code Status: Full Code  Home/SNF/Other Home Patient oriented to: self, place, time, and situation Is this baseline? Yes   Triage Complete: Triage complete  Chief Complaint Nausea & vomiting [R11.2]  Triage Note Pt BIB EMS for nausea vomiting and weakness x days. Dx with pneumonia 3 days ago. Given fluids during visit and now has swelling to R stump. R BKA. EMS VS: 162/92 HR 116 99% RA CBG 334   Allergies Allergies  Allergen Reactions   Almond Oil Hives   Almond (Diagnostic) Hives   Bee Venom Hives   Limonene Hives    Level of Care/Admitting Diagnosis ED Disposition     ED Disposition  Admit   Condition  --   Comment  Hospital Area: [facility] [[id number]]  Level of Care: Telemetry Medical [[id number]]  May admit patient to [facility] or [facility] if equivalent level of care is available:: No  Covid Evaluation: Asymptomatic - no recent exposure (last 10 days) testing not required  Diagnosis: Nausea & vomiting [277057]  Admitting Physician: Rometta Emery [[id number]]  Attending Physician: Rometta Emery [[id number]]  Certification:: I certify this patient will need inpatient services for at least 2 midnights  Estimated Length of Stay: 3          B Medical/Surgery History Past Medical History:  Diagnosis Date   Chronic hypertension    12-16-2020  documented in epic since 2017 pt dx hypertension (not during pregnancy);  pt currently is not and has not been taking any bp med,  bp at [facility] 12-15-2020 165/ 104   Missed ab 12/15/2020   Type 2 diabetes mellitus (HCC)    12-16-2020  pt stated only during pregnancy took medication, at [facility] 12-15-2020 blood glucose 353 pt was given metformin bid and pt stated did start taking it   Wears glasses    Past Surgical History:  Procedure Laterality Date    BELOW KNEE LEG AMPUTATION Right    CESAREAN SECTION N/A 12/25/2018   Procedure: CESAREAN SECTION;  Surgeon: Levie Heritage, DO;  Location: [facility] LD ORS;  Service: Obstetrics;  Laterality: N/A;   TONSILLECTOMY AND ADENOIDECTOMY  age 80     A IV Location/Drains/Wounds Patient Lines/Drains/Airways Status     Active Line/Drains/Airways     Name Placement date Placement time Site Days   Peripheral IV 03/15/23 20 G Left Antecubital 03/15/23  1825  Antecubital  less than 1   Incision (Closed) 12/25/18 Abdomen Other (Comment) 12/25/18  0801  -- 1541   Incision (Closed) 12/25/18 Perineum Other (Comment) 12/25/18  0801  -- 1541            Intake/Output Last 24 hours  Intake/Output Summary (Last 24 hours) at 03/15/2023 2225 Last data filed at 03/15/2023 2129 Gross per 24 hour  Intake 100 ml  Output --  Net 100 ml    Labs/Imaging Results for orders placed or performed during the hospital encounter of 03/15/23 (from the past 48 hour(s))  Urinalysis, Routine w reflex microscopic -Urine, Clean Catch     Status: Abnormal   Collection Time: 03/15/23  5:48 PM  Result Value Ref Range   Color, Urine YELLOW YELLOW   APPearance HAZY (A) CLEAR   Specific Gravity, Urine 1.025 1.005 - 1.030   pH 6.0 5.0 - 8.0   Glucose, UA >=500 (  A) NEGATIVE mg/dL   Hgb urine dipstick MODERATE (A) NEGATIVE   Bilirubin Urine NEGATIVE NEGATIVE   Ketones, ur 15 (A) NEGATIVE mg/dL   Protein, ur >161 (A) NEGATIVE mg/dL   Nitrite NEGATIVE NEGATIVE   Leukocytes,Ua NEGATIVE NEGATIVE    Comment: Performed at [facility], [address], [place], [place] [address]  Urinalysis, Microscopic (reflex)     Status: Abnormal   Collection Time: 03/15/23  5:48 PM  Result Value Ref Range   RBC / HPF 6-10 0 - 5 RBC/hpf   WBC, UA 6-10 0 - 5 WBC/hpf   Bacteria, UA RARE (A) NONE SEEN   Squamous Epithelial / HPF 11-20 0 - 5 /HPF   Mucus PRESENT     Comment: Performed at [facility], [address],  [place], [place] [address]  CBC with Differential     Status: Abnormal   Collection Time: 03/15/23  6:23 PM  Result Value Ref Range   WBC 38.6 (H) 4.0 - 10.5 K/uL   RBC 4.13 3.87 - 5.11 MIL/uL   Hemoglobin 12.1 12.0 - 15.0 g/dL   HCT 11.9 14.7 - 82.9 %   MCV 88.6 80.0 - 100.0 fL   MCH 29.3 26.0 - 34.0 pg   MCHC 33.1 30.0 - 36.0 g/dL   RDW 56.2 13.0 - 86.5 %   Platelets 503 (H) 150 - 400 K/uL   nRBC 0.0 0.0 - 0.2 %   Neutrophils Relative % 92 %   Neutro Abs 35.2 (H) 1.7 - 7.7 K/uL   Lymphocytes Relative 3 %   Lymphs Abs 1.2 0.7 - 4.0 K/uL   Monocytes Relative 1 %   Monocytes Absolute 0.5 0.1 - 1.0 K/uL   Eosinophils Relative 0 %   Eosinophils Absolute 0.0 0.0 - 0.5 K/uL   Basophils Relative 0 %   Basophils Absolute 0.1 0.0 - 0.1 K/uL   WBC Morphology MORPHOLOGY UNREMARKABLE    RBC Morphology MORPHOLOGY UNREMARKABLE    Smear Review MORPHOLOGY UNREMARKABLE    Immature Granulocytes 4 %   Abs Immature Granulocytes 1.50 (H) 0.00 - 0.07 K/uL    Comment: Performed at [facility], [address] [place], [place] [address]  Comprehensive metabolic panel     Status: Abnormal   Collection Time: 03/15/23  6:23 PM  Result Value Ref Range   Sodium 130 (L) 135 - 145 mmol/L   Potassium 3.9 3.5 - 5.1 mmol/L   Chloride 95 (L) 98 - 111 mmol/L   CO2 22 22 - 32 mmol/L   Glucose, Bld 299 (H) 70 - 99 mg/dL    Comment: Glucose reference range applies only to samples taken after fasting for at least 8 hours.   BUN 24 (H) 6 - 20 mg/dL   Creatinine, Ser 6.29 (H) 0.44 - 1.00 mg/dL   Calcium 8.4 (L) 8.9 - 10.3 mg/dL   Total Protein 8.1 6.5 - 8.1 g/dL   Albumin 1.7 (L) 3.5 - 5.0 g/dL   AST 13 (L) 15 - 41 U/L   ALT 15 0 - 44 U/L   Alkaline Phosphatase 148 (H) 38 - 126 U/L   Total Bilirubin 0.6 0.3 - 1.2 mg/dL   GFR, Estimated 58 (L) >60 mL/min    Comment: (NOTE) Calculated using the CKD-EPI Creatinine Equation (2021)    Anion gap 13 5 - 15    Comment: Performed at [facility], [address], [place], [place] [address]  Lipase, blood  Status: None   Collection Time: 03/15/23  6:23 PM  Result Value Ref Range   Lipase 19 11 - 51 U/L    Comment: Performed at [facility], [address], [place], [place] [address]  POC urine preg, ED     Status: None   Collection Time: 03/15/23  6:29 PM  Result Value Ref Range   Preg Test, Ur NEGATIVE NEGATIVE    Comment:        THE SENSITIVITY OF THIS METHODOLOGY IS >24 mIU/mL   I-Stat venous blood gas, ([facility] ED, [facility], [facility])     Status: Abnormal   Collection Time: 03/15/23  9:45 PM  Result Value Ref Range   pH, Ven 7.478 (H) 7.25 - 7.43   pCO2, Ven 31.5 (L) 44 - 60 mmHg   pO2, Ven 73 (H) 32 - 45 mmHg   Bicarbonate 23.4 20.0 - 28.0 mmol/L   TCO2 24 22 - 32 mmol/L   O2 Saturation 96 %   Acid-Base Excess 0.0 0.0 - 2.0 mmol/L   Sodium 133 (L) 135 - 145 mmol/L   Potassium 3.9 3.5 - 5.1 mmol/L   Calcium, Ion 1.04 (L) 1.15 - 1.40 mmol/L   HCT 35.0 (L) 36.0 - 46.0 %   Hemoglobin 11.9 (L) 12.0 - 15.0 g/dL   Sample type VENOUS    DG Chest 2 View  Result Date: 03/15/2023 CLINICAL DATA:  Pneumonia, cough EXAM: CHEST - 2 VIEW COMPARISON:  Previous studies including the examination of 03/13/2023 FINDINGS: There is patchy infiltrate in right upper lung field with no significant change. Cardiac size is within normal limits. There are no signs of pulmonary edema or new focal infiltrates. There is no pleural effusion or pneumothorax. IMPRESSION: There is small patchy infiltrate in right upper lung field suggesting pneumonia. There are no new infiltrates or signs of pulmonary edema. There is no pleural effusion. Electronically Signed   By: Ernie Avena M.D.   On: 03/15/2023 19:15    Pending Labs Unresulted Labs (From admission, onward)     Start     Ordered   03/15/23 1946  Lactic acid, plasma  (Undifferentiated presentation (screening labs and basic nursing orders))  Now then every 2 hours,   R      03/15/23 1946   Signed and Held   Hemoglobin A1c  Once,   R       Comments: To assess prior glycemic control    Signed and Held   Signed and Held  HIV Antibody (routine testing w rflx)  (HIV Antibody (Routine testing w reflex) panel)  Once,   R        Signed and Held   Signed and Held  CBC  (enoxaparin (LOVENOX)    CrCl >/= 30 ml/min)  Once,   R       Comments: Baseline for enoxaparin therapy IF NOT ALREADY DRAWN.  Notify MD if PLT < 100 K.    Signed and Held   Signed and Held  Creatinine, serum  (enoxaparin (LOVENOX)    CrCl >/= 30 ml/min)  Once,   R       Comments: Baseline for enoxaparin therapy IF NOT ALREADY DRAWN.    Signed and Held   Signed and Held  Creatinine, serum  (enoxaparin (LOVENOX)    CrCl >/= 30 ml/min)  Weekly,   R     Comments: while on enoxaparin therapy    Signed and Held   Signed and Held  Comprehensive metabolic  panel  Tomorrow morning,   R        Signed and Held   Signed and Held  CBC  Tomorrow morning,   R        Signed and Held            Vitals/Pain Today's Vitals   03/15/23 2000 03/15/23 2159 03/15/23 2200 03/15/23 2224  BP: (!) 164/94  (!) 169/147   Pulse: (!) 117  (!) 122   Resp: (!) 22   (!) 24  Temp:  98.5 F (36.9 C)    TempSrc:  Oral    SpO2: 99%  99%   Weight:      Height:      PainSc:        Isolation Precautions No active isolations  Medications Medications  azithromycin (ZITHROMAX) 500 mg in sodium chloride 0.9 % 250 mL IVPB (500 mg Intravenous New Bag/Given 03/15/23 2130)  ondansetron (ZOFRAN) injection 4 mg (4 mg Intravenous Given 03/15/23 1930)  lactated ringers bolus 1,000 mL (1,000 mLs Intravenous Bolus 03/15/23 1932)  cefTRIAXone (ROCEPHIN) 1 g in sodium chloride 0.9 % 100 mL IVPB (0 g Intravenous Stopped 03/15/23 2129)  ondansetron (ZOFRAN) injection 4 mg (4 mg Intravenous Given 03/15/23 2041)  droperidol (INAPSINE) 2.5 MG/ML injection 2.5 mg (2.5 mg Intravenous Given 03/15/23 2134)    Mobility walks     Focused Assessments Abdo - minimal pain but  severe nausesa/vomiting. Given zofran x 2 and droperidol   R Recommendations: See Admitting Provider Note  Report given to:   Additional Notes: pt has bedside commode in room and uses independently

## 2023-03-15 NOTE — H&P (Signed)
History and Physical    Patient: [name] ZOX:[id number] DOB: [birth year] DOA: 03/15/2023 DOS: the patient was seen and examined on 03/15/2023 PCP: Martina Sinner, NP  Patient coming from: Home  Chief Complaint:  Chief Complaint  Patient presents with   Nausea   Weakness   Emesis   HPI: [name] is a 37 y.o. female with medical history significant of insulin-dependent diabetes type 2, essential hypertension, morbid obesity, chronic hyponatremia, who presented to the ER with intractable nausea and vomiting.  Patient has been unable to keep anything down.  Has some abdominal pain.  Denies any significant diarrhea.  Patient denied any sick contacts.  Denied eating outside the house.  She has no prior history of gastroparesis.  Patient also reported generalized weakness.  She has received multiple doses of Zofran in the ER but still vomiting so she is being admitted for further evaluation and treatment.  Patient has been taking her medications.  Evaluation in the ER showed no evidence of DKA.  Review of Systems: As mentioned in the history of present illness. All other systems reviewed and are negative. Past Medical History:  Diagnosis Date   Chronic hypertension    12-16-2020  documented in epic since 2017 pt dx hypertension (not during pregnancy);  pt currently is not and has not been taking any bp med,  bp at [facility] 12-15-2020 165/ 104   Missed ab 12/15/2020   Type 2 diabetes mellitus (HCC)    12-16-2020  pt stated only during pregnancy took medication, at [facility] 12-15-2020 blood glucose 353 pt was given metformin bid and pt stated did start taking it   Wears glasses    Past Surgical History:  Procedure Laterality Date   BELOW KNEE LEG AMPUTATION Right    CESAREAN SECTION N/A 12/25/2018   Procedure: CESAREAN SECTION;  Surgeon: Levie Heritage, DO;  Location: [facility] LD ORS;  Service: Obstetrics;  Laterality: N/A;   TONSILLECTOMY AND ADENOIDECTOMY  age 32   Social History:   reports that she has never smoked. She has never used smokeless tobacco. She reports that she does not drink alcohol and does not use drugs.  Allergies  Allergen Reactions   Almond Oil Hives   Almond (Diagnostic) Hives   Bee Venom Hives   Limonene Hives    Family History  Problem Relation Age of Onset   Diabetes Mother    Hypertension Mother    Hypertension Maternal Grandmother    Diabetes Maternal Aunt    Diabetes Maternal Uncle     Prior to Admission medications   Medication Sig Start Date End Date Taking? Authorizing Provider  atorvastatin (LIPITOR) 10 MG tablet Take 1 tablet (10 mg total) by mouth daily. 03/15/23 03/14/24  St Vena Austria, NP  cetirizine (ZYRTEC) 10 MG tablet Take 1 tablet (10 mg total) by mouth daily. 03/13/23   St Vena Austria, NP  Continuous Glucose Sensor (FREESTYLE LIBRE 3 SENSOR) MISC 1 Box by Does not apply route every 14 (fourteen) days. Place 1 sensor on the skin every 14 days. Use to check glucose continuously 03/13/23   Evern Bio, Dois Davenport, NP  Finerenone (KERENDIA) 10 MG TABS Take 1 tablet (10 mg total) by mouth daily. 03/15/23   St Vena Austria, NP  fish oil-omega-3 fatty acids 1000 MG capsule Take 2 capsules (2 g total) by mouth daily. 03/13/23   St Vena Austria, NP  insulin glargine (LANTUS SOLOSTAR) 100 UNIT/ML Solostar Pen Inject 15 Units into  the skin at bedtime. Patient is taking only 10 unit 03/13/23   Martina Sinner, NP  metFORMIN (GLUCOPHAGE-XR) 500 MG 24 hr tablet Take 1 tablet (500 mg total) by mouth daily with breakfast. 03/13/23   Martina Sinner, NP  NIFEdipine (ADALAT CC) 30 MG 24 hr tablet Take 1 tablet (30 mg total) by mouth daily. 03/13/23   St Santa Lighter, Dois Davenport, NP  NOVOLOG FLEXPEN 100 UNIT/ML FlexPen Inject 5 Units into the skin 3 (three) times daily with meals. Also can use sliding scale if needed. Patient is taking 3 U at dinner and 5 unit Breakfast and lunch 03/13/23    Evern Bio, Dois Davenport, NP  ondansetron (ZOFRAN) 4 MG tablet Take 1 tablet (4 mg total) by mouth every 8 (eight) hours as needed for nausea or vomiting. 03/13/23   St Vena Austria, NP  Semaglutide,0.25 or 0.5MG /DOS, (OZEMPIC, 0.25 OR 0.5 MG/DOSE,) 2 MG/3ML SOPN Inject 0.25 mg into the skin every 7 (seven) days. 03/13/23   St Vena Austria, NP    Physical Exam: Vitals:   03/15/23 2000 03/15/23 2159 03/15/23 2200 03/15/23 2224  BP: (!) 164/94  (!) 169/147   Pulse: (!) 117  (!) 122   Resp: (!) 22   (!) 24  Temp:  98.5 F (36.9 C)    TempSrc:  Oral    SpO2: 99%  99%   Weight:      Height:       Constitutional: Acutely ill looking no distress NAD, calm, comfortable Eyes: PERRL, lids and conjunctivae normal ENMT: Mucous membranes are moist. Posterior pharynx clear of any exudate or lesions.Normal dentition.  Neck: normal, supple, no masses, no thyromegaly Respiratory: clear to auscultation bilaterally, no wheezing, no crackles. Normal respiratory effort. No accessory muscle use.  Cardiovascular: Sinus tachycardia, no murmurs / rubs / gallops. No extremity edema. 2+ pedal pulses. No carotid bruits.  Abdomen: no tenderness, no masses palpated. No hepatosplenomegaly. Bowel sounds positive.  Musculoskeletal: Good range of motion, no joint swelling or tenderness, Skin: no rashes, lesions, ulcers. No induration Neurologic: CN 2-12 grossly intact. Sensation intact, DTR normal. Strength 5/5 in all 4.  Psychiatric: Normal judgment and insight. Alert and oriented x 3.  Anxious mood  Data Reviewed:  Sodium is 130, potassium 3.9, chloride 95, CO2 22, glucose 299, BUN 24 creatinine 1.24 calcium 8.4.  Alkaline phosphatase 148 albumin 1.7.  White count is 38.6 hemoglobin 12.1 platelet 503.  Pregnancy test is negative.  Urinalysis essentially negative.  Chest x-ray showed small patchy infiltrate in the right upper lung field suggesting pneumonia.  Assessment and Plan:  #1  intractable nausea with vomiting: Not in DKA.  Possibly secondary to gastroparesis.  Could also be some viral illness.  Patient will be admitted for symptom control.  Initiate Zofran.  May also use Phenergan if needed.  Reglan is an option if all that fails.  Clear liquid diet and advance as tolerated.  #2 possible pneumonia on x-ray: Patient has leukocytosis.  No hypoxia.  I will empirically start antibiotics with Rocephin and Zithromax.  Cultures will be obtained.  White count noted to be 38,000.  Could also be aspiration from intractable nausea and vomiting.  #3 insulin-dependent type 2 diabetes: Sliding scale insulin  #4 uncontrolled hypertension: Continue home regimen if tolerated.  Otherwise IV medications.  #5 hyponatremia: Most likely dehydration.  Continue to monitor.  #6 AKI: Most likely prerenal.  Hydrate and monitor.  #7 obesity: Dietary counseling.  Advance Care Planning:   Code Status: Full Code   Consults: None  Family Communication: No family at bedside  Severity of Illness: The appropriate patient status for this patient is INPATIENT. Inpatient status is judged to be reasonable and necessary in order to provide the required intensity of service to ensure the patient's safety. The patient's presenting symptoms, physical exam findings, and initial radiographic and laboratory data in the context of their chronic comorbidities is felt to place them at high risk for further clinical deterioration. Furthermore, it is not anticipated that the patient will be medically stable for discharge from the hospital within 2 midnights of admission.   * I certify that at the point of admission it is my clinical judgment that the patient will require inpatient hospital care spanning beyond 2 midnights from the point of admission due to high intensity of service, high risk for further deterioration and high frequency of surveillance required.*  AuthorLonia Blood, MD 03/15/2023 11:04  PM  For on call review [email or web address].

## 2023-03-15 NOTE — ED Provider Triage Note (Signed)
Emergency Medicine Provider Triage Evaluation Note  Daysie Helf , a 37 y.o. female  was evaluated in triage.  Pt complains of uncontrolled nausea and vomiting since starting antibiotics for pneumonia 3 days ago.  She has had a cough and rhinorrhea as well as subjective fever and chills for approximately 1-1/2 weeks.  She was evaluated at [facility] and started on Augmentin and doxycycline 3 days ago.  States that she has been unable to keep these down or tolerate p.o since discharge.  She has tried Zofran and Phenergan at home without relief.  No associated abdominal pain.  No chest pain or shortness of breath.  Review of Systems  Positive: See HPI Negative: See HPI  Physical Exam  Ht 5\' 7"  (1.702 m)   Wt 86.6 kg   BMI 29.91 kg/m  Gen:   Awake, no distress   Resp:  Normal effort lungs clear to auscultation, no tachypnea, no signs respiratory distress MSK:   Previous right BKA, otherwise moves all extremities spontaneously Other:  Regular rate and rhythm, nontoxic-appearing  Medical Decision Making  Medically screening exam initiated at 5:48 PM.  Appropriate orders placed.  Nykayla Marcelli was informed that the remainder of the evaluation will be completed by another provider, this initial triage assessment does not replace that evaluation, and the importance of remaining in the ED until their evaluation is complete.     Tonette Lederer, PA-C 03/15/23 1749

## 2023-03-16 DIAGNOSIS — N179 Acute kidney failure, unspecified: Secondary | ICD-10-CM | POA: Diagnosis not present

## 2023-03-16 LAB — COMPREHENSIVE METABOLIC PANEL
ALT: 14 U/L (ref 0–44)
AST: 10 U/L — ABNORMAL LOW (ref 15–41)
Albumin: <1.5 g/dL — ABNORMAL LOW (ref 3.5–5.0)
Alkaline Phosphatase: 116 U/L (ref 38–126)
Anion gap: 11 (ref 5–15)
BUN: 19 mg/dL (ref 6–20)
CO2: 24 mmol/L (ref 22–32)
Calcium: 8 mg/dL — ABNORMAL LOW (ref 8.9–10.3)
Chloride: 99 mmol/L (ref 98–111)
Creatinine, Ser: 1.07 mg/dL — ABNORMAL HIGH (ref 0.44–1.00)
GFR, Estimated: >60 mL/min (ref >60)
Glucose, Bld: 147 mg/dL — ABNORMAL HIGH (ref 70–99)
Potassium: 3.1 mmol/L — ABNORMAL LOW (ref 3.5–5.1)
Sodium: 134 mmol/L — ABNORMAL LOW (ref 135–145)
Total Bilirubin: 0.8 mg/dL (ref 0.3–1.2)
Total Protein: 6.8 g/dL (ref 6.5–8.1)

## 2023-03-16 LAB — HEMOGLOBIN A1C
Hgb A1c MFr Bld: 11.9 % — ABNORMAL HIGH (ref 4.8–5.6)
Mean Plasma Glucose: 294.83 mg/dL

## 2023-03-16 LAB — CBC
HCT: 29.1 % — ABNORMAL LOW (ref 36.0–46.0)
Hemoglobin: 9.8 g/dL — ABNORMAL LOW (ref 12.0–15.0)
MCH: 28.2 pg (ref 26.0–34.0)
MCHC: 33.7 g/dL (ref 30.0–36.0)
MCV: 83.6 fL (ref 80.0–100.0)
Platelets: 457 10*3/uL — ABNORMAL HIGH (ref 150–400)
RBC: 3.48 MIL/uL — ABNORMAL LOW (ref 3.87–5.11)
RDW: 12.3 % (ref 11.5–15.5)
WBC: 38.2 10*3/uL — ABNORMAL HIGH (ref 4.0–10.5)
nRBC: 0 % (ref 0.0–0.2)

## 2023-03-16 LAB — GLUCOSE, CAPILLARY
Glucose-Capillary: 116 mg/dL — ABNORMAL HIGH (ref 70–99)
Glucose-Capillary: 144 mg/dL — ABNORMAL HIGH (ref 70–99)
Glucose-Capillary: 216 mg/dL — ABNORMAL HIGH (ref 70–99)
Glucose-Capillary: 220 mg/dL — ABNORMAL HIGH (ref 70–99)
Glucose-Capillary: 300 mg/dL — ABNORMAL HIGH (ref 70–99)

## 2023-03-16 LAB — HIV ANTIBODY (ROUTINE TESTING W REFLEX): HIV Screen 4th Generation wRfx: NONREACTIVE

## 2023-03-16 LAB — LACTIC ACID, PLASMA: Lactic Acid, Venous: 1 mmol/L (ref 0.5–1.9)

## 2023-03-16 LAB — STREP PNEUMONIAE URINARY ANTIGEN: Strep Pneumo Urinary Antigen: NEGATIVE

## 2023-03-16 MED ORDER — POTASSIUM CHLORIDE 10 MEQ/100ML IV SOLN
10.0000 meq | INTRAVENOUS | Status: AC
  Administered 2023-03-16 (×3): 10 meq via INTRAVENOUS
  Filled 2023-03-16 (×3): qty 100

## 2023-03-16 MED ORDER — INSULIN GLARGINE 100 UNIT/ML ~~LOC~~ SOLN
12.0000 [IU] | Freq: Every day | SUBCUTANEOUS | Status: DC
  Filled 2023-03-16 (×2): qty 0.12

## 2023-03-16 MED ORDER — PANTOPRAZOLE SODIUM 40 MG PO TBEC
40.0000 mg | DELAYED_RELEASE_TABLET | Freq: Every day | ORAL | Status: DC
  Administered 2023-03-16 – 2023-03-17 (×2): 40 mg via ORAL
  Filled 2023-03-16 (×2): qty 1

## 2023-03-16 MED ORDER — GUAIFENESIN ER 600 MG PO TB12
600.0000 mg | ORAL_TABLET | Freq: Two times a day (BID) | ORAL | Status: DC
  Administered 2023-03-16 – 2023-03-17 (×3): 600 mg via ORAL
  Filled 2023-03-16 (×4): qty 1

## 2023-03-16 MED ORDER — INSULIN GLARGINE-YFGN 100 UNIT/ML ~~LOC~~ SOLN
12.0000 [IU] | Freq: Every day | SUBCUTANEOUS | Status: DC
  Filled 2023-03-16: qty 0.12

## 2023-03-16 MED ORDER — PROCHLORPERAZINE EDISYLATE 10 MG/2ML IJ SOLN
10.0000 mg | Freq: Four times a day (QID) | INTRAMUSCULAR | Status: DC | PRN
  Administered 2023-03-16 – 2023-03-17 (×2): 10 mg via INTRAVENOUS
  Filled 2023-03-16 (×3): qty 2

## 2023-03-16 MED ORDER — ALUM & MAG HYDROXIDE-SIMETH 200-200-20 MG/5ML PO SUSP
30.0000 mL | Freq: Four times a day (QID) | ORAL | Status: DC | PRN
  Administered 2023-03-16 – 2023-03-17 (×2): 30 mL via ORAL
  Filled 2023-03-16 (×2): qty 30

## 2023-03-16 MED ORDER — SODIUM CHLORIDE 0.9 % IV BOLUS
500.0000 mL | Freq: Once | INTRAVENOUS | Status: AC
  Administered 2023-03-16: 500 mL via INTRAVENOUS

## 2023-03-16 MED ORDER — PROCHLORPERAZINE EDISYLATE 10 MG/2ML IJ SOLN
10.0000 mg | Freq: Once | INTRAMUSCULAR | Status: AC
  Administered 2023-03-16: 10 mg via INTRAVENOUS
  Filled 2023-03-16: qty 2

## 2023-03-16 MED ORDER — PROCHLORPERAZINE EDISYLATE 10 MG/2ML IJ SOLN
5.0000 mg | INTRAMUSCULAR | Status: AC
  Administered 2023-03-16: 5 mg via INTRAVENOUS

## 2023-03-16 MED ORDER — SODIUM CHLORIDE 0.9 % IV SOLN
2.0000 g | INTRAVENOUS | Status: DC
  Administered 2023-03-16 – 2023-03-17 (×2): 2 g via INTRAVENOUS
  Filled 2023-03-16 (×2): qty 20

## 2023-03-16 NOTE — Plan of Care (Signed)

## 2023-03-16 NOTE — TOC Initial Note (Signed)
Transition of Care [facility]) - Initial/Assessment Note    Patient Details  Name: [name] MRN: [id number] Date of Birth: 1986/01/24  Transition of Care [facility]) CM/SW Contact:    Kermit Balo, RN Phone Number: 03/16/2023, 2:55 PM  Clinical Narrative:                 Pt is from home with her 36 yo son. She denies any issues with transportation. She denies issues with home medications. Pt states her uncle will transport her home when discharged.  TOC following.  Expected Discharge Plan: Home/Self Care Barriers to Discharge: Continued Medical Work up   Patient Goals and CMS Choice            Expected Discharge Plan and Services       Living arrangements for the past 2 months: Apartment                                      Prior Living Arrangements/Services Living arrangements for the past 2 months: Apartment Lives with:: Minor Children Patient language and need for interpreter reviewed:: Yes Do you feel safe going back to the place where you live?: Yes          Current home services: DME (prosthesis/ wheelchair/ shower seat/ cane/ walker) Criminal Activity/Legal Involvement Pertinent to Current Situation/Hospitalization: No - Comment as needed  Activities of Daily Living Home Assistive Devices/Equipment: Bedside commode/3-in-1, Wheelchair, Environmental consultant (specify type) ADL Screening (condition at time of admission) Patient's cognitive ability adequate to safely complete daily activities?: Yes Is the patient deaf or have difficulty hearing?: No Does the patient have difficulty seeing, even when wearing glasses/contacts?: No Does the patient have difficulty concentrating, remembering, or making decisions?: No Patient able to express need for assistance with ADLs?: Yes Does the patient have difficulty dressing or bathing?: No Independently performs ADLs?: Yes (appropriate for developmental age) Does the patient have difficulty walking or climbing stairs?:  Yes Weakness of Legs: Right Weakness of Arms/Hands: None  Permission Sought/Granted                  Emotional Assessment Appearance:: Appears stated age Attitude/Demeanor/Rapport: Engaged Affect (typically observed): Accepting Orientation: : Oriented to Self, Oriented to Place, Oriented to  Time, Oriented to Situation   Psych Involvement: No (comment)  Admission diagnosis:  Nausea & vomiting [R11.2] AKI (acute kidney injury) (HCC) [N17.9] Community acquired pneumonia of right upper lobe of lung [J18.9] Patient Active Problem List   Diagnosis Date Noted   Proteinuria due to type 2 diabetes mellitus (HCC) 03/15/2023   Nausea & vomiting 03/15/2023   Lobar pneumonia (HCC) 03/15/2023   Long-term (current) use of injectable non-insulin antidiabetic drugs 03/13/2023   Diabetes mellitus (HCC) 03/13/2023   High triglycerides 08/08/2022   Essential hypertension 01/21/2022   Insulin dependent type 2 diabetes mellitus (HCC) 01/21/2022   Chronic pain of right knee 01/13/2022   Nausea 01/13/2022   Type 2 diabetes mellitus with hyperglycemia (HCC) 11/30/2021   Hypokalemia 10/02/2021   Hypoalbuminemia 09/26/2021   Positive for macroalbuminuria 09/26/2021   Acute blood loss anemia 09/22/2021   ATN (acute tubular necrosis) (HCC) 09/22/2021   AKI (acute kidney injury) (HCC) 09/20/2021   Hyponatremia 09/19/2021   Diabetes mellitus type 2, noninsulin dependent (HCC) 09/19/2021   Chronic hypertension 12/16/2020   Missed abortion 12/16/2020   Status post cesarean delivery 12/15/2020   Establishing care with new doctor, encounter  for 12/22/2018   Pap smear abnormality of cervix/human papillomavirus (HPV) positive 06/25/2018   BMI 33.0-33.9,adult 03/30/2016   PCP:  Martina Sinner, NP Pharmacy:   [facility] [place] [facility] [address] [place] [address] Phone: [phone] Fax: [phone]  [facility] [address],  [place] [address] [place] [address] [place] [address] Phone: [phone] Fax: [phone]     Social Determinants of Health (SDOH) Social History: SDOH Screenings   Food Insecurity: No Food Insecurity (03/13/2023)  Housing: Low Risk  (03/13/2023)  Transportation Needs: No Transportation Needs (03/13/2023)  Utilities: Not At Risk (03/13/2023)  Alcohol Screen: Low Risk  (03/16/2023)  Depression (PHQ2-9): Low Risk  (03/16/2023)  Financial Resource Strain: Low Risk  (03/16/2023)  Physical Activity: Insufficiently Active (03/16/2023)  Social Connections: Moderately Integrated (03/13/2023)  Recent Concern: Social Connections - Moderately Isolated (03/13/2023)  Stress: No Stress Concern Present (03/16/2023)  Tobacco Use: Low Risk  (03/15/2023)   SDOH Interventions: Financial Strain Interventions: Intervention Not Indicated Stress Interventions: Intervention Not Indicated   Readmission Risk Interventions     No data to display

## 2023-03-16 NOTE — Plan of Care (Signed)
  Problem: Education: Goal: Ability to describe self-care measures that may prevent or decrease complications (Diabetes Survival Skills Education) will improve Outcome: Progressing   Problem: Fluid Volume: Goal: Ability to maintain a balanced intake and output will improve Outcome: Progressing   Problem: Nutritional: Goal: Maintenance of adequate nutrition will improve Outcome: Progressing   Problem: Skin Integrity: Goal: Risk for impaired skin integrity will decrease Outcome: Progressing   

## 2023-03-16 NOTE — Inpatient Diabetes Management (Signed)
Inpatient Diabetes Program Recommendations  AACE/ADA: New Consensus Statement on Inpatient Glycemic Control (2015)  Target Ranges:  Prepandial:   less than 140 mg/dL      Peak postprandial:   less than 180 mg/dL (1-2 hours)      Critically ill patients:  140 - 180 mg/dL   Lab Results  Component Value Date   GLUCAP 216 (H) 03/16/2023   HGBA1C 11.9 (H) 03/16/2023    Review of Glycemic Control  Latest Reference Range & Units 03/16/23 00:22 03/16/23 07:42 03/16/23 11:47  Glucose-Capillary 70 - 99 mg/dL 161 (H) 096 (H) 045 (H)  (H): Data is abnormally high Diabetes history: Type 2 DM Outpatient Diabetes medications: Ozempic 0.25 mg qwk, Lantus 10 units at bedtime, Metformin 500 mg every day, Novolog 5 units TID Current orders for Inpatient glycemic control: Semglee 12 units at bedtime, Novolog 0-15 units TID & HS  Inpatient Diabetes Program Recommendations:    Spoke with patient regarding outpatient diabetes management. Patient reports that she has struggled to obtain Ozempic from pharmacy. Upon further questioning, patient admits to frequently missing meal coverage due to fear of hypoglycemia. Reviewed patient's current A1c of 11.9%. Explained what a A1c is and what it measures. Also reviewed goal A1c with patient, importance of good glucose control @ home, and blood sugar goals. Reviewed patho of DM, need for improved glycemic control, survival skills, interventions, how to better obtain GLPs, goals/targets for pregnancy and related risks, vascular changes, impact of infection risk and other commorbidities.  Patient has meter and testing supplies, however, is requesting Freestyle libre 3. Has used prior, but had not picked this up. Sensor provided to patient and reviewed app and when to reach out to provider. Also, encouraged to follow up with endocrinology. Had previously seen [facility], however, can no longer make the drive due to transportation. Can drive within [facility]  location.  Discussed hypoglycemia and timing with patient. Reviewed how to utilize FSL as a tool to help assist and importance of titration with physician and taking to cover CHOs. Patient has no further questions at this time.   Thanks, Lujean Rave, MSN, RNC-OB Diabetes Coordinator [phone] (8a-5p)

## 2023-03-16 NOTE — Discharge Instructions (Signed)
Local Endocrinologists [facility] ([phone]) Dr. Cristina Gherghe Dr. Ajay Kumar [facility] ([phone]) Dr. Jeffrey Kerr [facility] ([phone]) Dr. Bindubal Balan Dr. Walter Kohut [facility] ([phone]) Dr. Stephen [facility] ([phone]) [[place] office]  ([phone]) [[place] office] Dr. Melissa Solum Dr. Thomas O'Connell [facility] ([facility]) ([phone]) Autumn Hudnall ([place]), PA Dr. Dhaval Patel Dr. William Smith Jr. [facility] ([phone]) Dr. Gebre Nida [facility] ([phone]) Dr. Micheal Brennan Dr. Jennifer Badik Dr. Kirsti Jessup Spencer Beasley, FNP Dr. Monica E. Doerr in [place] ([phone]) 

## 2023-03-16 NOTE — Progress Notes (Signed)
PROGRESS NOTE    [name]  ZOX:[id number] DOB: 06-25-1986 DOA: 03/15/2023 PCP: Martina Sinner, NP   Brief Narrative: 37 year old with past medical history significant for insulin-dependent diabetes type 2, hypertension, morbid obesity, chronic hyponatremia presented to ED complaining of intractable nausea and vomiting.  She also reports abdominal pain.  Denies diarrhea.  She reports a prior history of gastroparesis.  Patient admitted with intractable nausea and vomiting, hyperglycemia and community-acquired pneumonia.   Assessment & Plan:   Principal Problem:   Nausea & vomiting Active Problems:   BMI 33.0-33.9,adult   Chronic hypertension   AKI (acute kidney injury) (HCC)   Hyponatremia   Insulin dependent type 2 diabetes mellitus (HCC)   Lobar pneumonia (HCC)   1-Nausea, vomiting, abdominal pain: Secondary to gastroenteritis vs Gastroparesis.  Improved.  PRN Zofran. Advanced diet.   2-Community-acquired pneumonia: -Present with leukocytosis, cough, chest x ray: There is small patchy infiltrate in right upper lung field suggesting pneumonia. -WBC still elevated.  -Continue with IV ceftriaxone and Azithromycin.  -Check Legionella antigen and strep Pneumonia.   3-Diabetes type 2 with hyperglycemia uncontrolled: Not in DKA.  Start Lantus , continue with SSI.   4-Hypertension, uncontrolled: Holding BP meds in setting vomiting.   Hypokalemia: replete IV  Hyponatremia: received IV fluids.   AKI: Cr baseline 0.7, presents with cr 1.2  Continue with IV fluids.   Obesity: life style modification.     Estimated body mass index is 29.9 kg/m as calculated from the following:   Height as of this encounter: 5\' 7"  (1.702 m).   Weight as of this encounter: 86.6 kg.   DVT prophylaxis: SCD Code Status: Full code Family Communication: Care discussed with patient.  Disposition Plan:  Status is: Inpatient Remains inpatient appropriate because: management  of hyperglycemia, PNA    Consultants:  None  Procedures:  none  Antimicrobials:    Subjective: She is alert, conversant. Report improvement of nausea and vomiting. Report mild cough  Objective: Vitals:   03/15/23 2308 03/15/23 2338 03/15/23 2342 03/16/23 0418  BP: (!) 142/83 (!) 152/101  120/77  Pulse: (!) 110 (!) 116  98  Resp: 20 (!) 22  18  Temp: 98.2 F (36.8 C) 99.5 F (37.5 C) 99.5 F (37.5 C) 97.8 F (36.6 C)  TempSrc: Oral  Oral Oral  SpO2: 99% 100%  98%  Weight:  86.6 kg    Height:  5\' 7"  (1.702 m)      Intake/Output Summary (Last 24 hours) at 03/16/2023 0724 Last data filed at 03/16/2023 0648 Gross per 24 hour  Intake 1144.89 ml  Output 350 ml  Net 794.89 ml   Filed Weights   03/15/23 1739 03/15/23 2338  Weight: 86.6 kg 86.6 kg    Examination:  General exam: Appears calm and comfortable  Respiratory system: Clear to auscultation. Respiratory effort normal. Cardiovascular system: S1 & S2 heard, RRR. No JVD, murmurs, rubs, gallops or clicks. No pedal edema. Gastrointestinal system: Abdomen is nondistended, soft and nontender. No organomegaly or masses felt. Normal bowel sounds heard. Central nervous system: Alert and oriented.  Extremities: Symmetric 5 x 5 power.   Data Reviewed: I have personally reviewed following labs and imaging studies  CBC: Recent Labs  Lab 03/13/23 0850 03/15/23 1823 03/15/23 2145 03/16/23 0450  WBC 15.2* 38.6*  --  38.2*  NEUTROABS 11.8* 35.2*  --   --   HGB 12.4 12.1 11.9* 9.8*  HCT 36.4 36.6 35.0* 29.1*  MCV 87 88.6  --  83.6  PLT 421 503*  --  457*   Basic Metabolic Panel: Recent Labs  Lab 03/13/23 0850 03/15/23 1823 03/15/23 2145 03/16/23 0450  NA 136 130* 133* 134*  K 4.5 3.9 3.9 3.1*  CL 96 95*  --  99  CO2 24 22  --  24  GLUCOSE 384* 299*  --  147*  BUN 17 24*  --  19  CREATININE 1.02* 1.24*  --  1.07*  CALCIUM 9.0 8.4*  --  8.0*   GFR: Estimated Creatinine Clearance: 82.9 mL/min (A) (by C-G  formula based on SCr of 1.07 mg/dL (H)). Liver Function Tests: Recent Labs  Lab 03/13/23 0850 03/15/23 1823 03/16/23 0450  AST 15 13* 10*  ALT 20 15 14   ALKPHOS 167* 148* 116  BILITOT 0.4 0.6 0.8  PROT 7.5 8.1 6.8  ALBUMIN 3.0* 1.7* <1.5*   Recent Labs  Lab 03/15/23 1823  LIPASE 19   No results for input(s): "AMMONIA" in the last 168 hours. Coagulation Profile: No results for input(s): "INR", "PROTIME" in the last 168 hours. Cardiac Enzymes: No results for input(s): "CKTOTAL", "CKMB", "CKMBINDEX", "TROPONINI" in the last 168 hours. BNP (last 3 results) No results for input(s): "PROBNP" in the last 8760 hours. HbA1C: Recent Labs    03/13/23 0848 03/16/23 0450  HGBA1C 10.3* 11.9*   CBG: Recent Labs  Lab 03/15/23 2308 03/16/23 0022  GLUCAP 294* 300*   Lipid Profile: Recent Labs    03/13/23 0850  CHOL 193  HDL 31*  LDLCALC 118*  TRIG 251*  CHOLHDL 6.2*   Thyroid Function Tests: Recent Labs    03/13/23 0850  TSH 1.160  T4TOTAL 7.3   Anemia Panel: No results for input(s): "VITAMINB12", "FOLATE", "FERRITIN", "TIBC", "IRON", "RETICCTPCT" in the last 72 hours. Sepsis Labs: Recent Labs  Lab 03/15/23 2228 03/16/23 0450  LATICACIDVEN 1.1 1.0    No results found for this or any previous visit (from the past 240 hour(s)).       Radiology Studies: DG Chest 2 View  Result Date: 03/15/2023 CLINICAL DATA:  Pneumonia, cough EXAM: CHEST - 2 VIEW COMPARISON:  Previous studies including the examination of 03/13/2023 FINDINGS: There is patchy infiltrate in right upper lung field with no significant change. Cardiac size is within normal limits. There are no signs of pulmonary edema or new focal infiltrates. There is no pleural effusion or pneumothorax. IMPRESSION: There is small patchy infiltrate in right upper lung field suggesting pneumonia. There are no new infiltrates or signs of pulmonary edema. There is no pleural effusion. Electronically Signed   By: Ernie Avena M.D.   On: 03/15/2023 19:15        Scheduled Meds:  enoxaparin (LOVENOX) injection  40 mg Subcutaneous Q24H   insulin aspart  0-15 Units Subcutaneous TID WC   insulin aspart  0-5 Units Subcutaneous QHS   Continuous Infusions:  azithromycin     cefTRIAXone (ROCEPHIN)  IV     lactated ringers 125 mL/hr at 03/16/23 0648   potassium chloride       LOS: 1 day    Time spent: 35 Minutes    Kamyah Wilhelmsen A Shondrea Steinert, MD [facility]   If 7PM-7AM, please contact night-coverage [email or web address]  03/16/2023, 7:24 AM

## 2023-03-17 DIAGNOSIS — J189 Pneumonia, unspecified organism: Secondary | ICD-10-CM | POA: Diagnosis not present

## 2023-03-17 DIAGNOSIS — N179 Acute kidney failure, unspecified: Secondary | ICD-10-CM | POA: Diagnosis not present

## 2023-03-17 LAB — BASIC METABOLIC PANEL
Anion gap: 11 (ref 5–15)
BUN: 10 mg/dL (ref 6–20)
CO2: 25 mmol/L (ref 22–32)
Calcium: 8 mg/dL — ABNORMAL LOW (ref 8.9–10.3)
Chloride: 97 mmol/L — ABNORMAL LOW (ref 98–111)
Creatinine, Ser: 0.74 mg/dL (ref 0.44–1.00)
GFR, Estimated: >60 mL/min (ref >60)
Glucose, Bld: 163 mg/dL — ABNORMAL HIGH (ref 70–99)
Potassium: 3.3 mmol/L — ABNORMAL LOW (ref 3.5–5.1)
Sodium: 133 mmol/L — ABNORMAL LOW (ref 135–145)

## 2023-03-17 LAB — GLUCOSE, CAPILLARY
Glucose-Capillary: 98 mg/dL (ref 70–99)
Glucose-Capillary: 181 mg/dL — ABNORMAL HIGH (ref 70–99)
Glucose-Capillary: 167 mg/dL — ABNORMAL HIGH (ref 70–99)
Glucose-Capillary: 173 mg/dL — ABNORMAL HIGH (ref 70–99)

## 2023-03-17 LAB — CBC
HCT: 31.3 % — ABNORMAL LOW (ref 36.0–46.0)
Hemoglobin: 10.7 g/dL — ABNORMAL LOW (ref 12.0–15.0)
MCH: 29.5 pg (ref 26.0–34.0)
MCHC: 34.2 g/dL (ref 30.0–36.0)
MCV: 86.2 fL (ref 80.0–100.0)
Platelets: 506 10*3/uL — ABNORMAL HIGH (ref 150–400)
RBC: 3.63 MIL/uL — ABNORMAL LOW (ref 3.87–5.11)
RDW: 12.2 % (ref 11.5–15.5)
WBC: 22.7 10*3/uL — ABNORMAL HIGH (ref 4.0–10.5)
nRBC: 0 % (ref 0.0–0.2)

## 2023-03-17 MED ORDER — MELATONIN 5 MG PO TABS
5.0000 mg | ORAL_TABLET | Freq: Every evening | ORAL | Status: DC | PRN
  Administered 2023-03-17: 5 mg via ORAL
  Filled 2023-03-17: qty 1

## 2023-03-17 MED ORDER — INSULIN GLARGINE 100 UNIT/ML ~~LOC~~ SOLN: 5.0000 [IU] | Freq: Every day | SUBCUTANEOUS | Status: DC

## 2023-03-17 MED ORDER — METOCLOPRAMIDE HCL 5 MG/ML IJ SOLN
5.0000 mg | Freq: Three times a day (TID) | INTRAMUSCULAR | Status: DC
  Administered 2023-03-17 – 2023-03-18 (×3): 5 mg via INTRAVENOUS
  Filled 2023-03-17 (×3): qty 2

## 2023-03-17 MED ORDER — MORPHINE SULFATE (PF) 2 MG/ML IV SOLN: 1.0000 mg | INTRAVENOUS | Status: DC | PRN

## 2023-03-17 MED ORDER — POTASSIUM CHLORIDE 10 MEQ/100ML IV SOLN
10.0000 meq | INTRAVENOUS | Status: AC
  Administered 2023-03-17 (×3): 10 meq via INTRAVENOUS
  Filled 2023-03-17 (×3): qty 100

## 2023-03-17 NOTE — Progress Notes (Signed)
PROGRESS NOTE    [name]  WJX:[id number] DOB: Mar 28, 1986 DOA: 03/15/2023 PCP: Martina Sinner, NP   Brief Narrative: 37 year old with past medical history significant for insulin-dependent diabetes type 2, hypertension, morbid obesity, chronic hyponatremia presented to ED complaining of intractable nausea and vomiting.  She also reports abdominal pain.  Denies diarrhea.  She reports a prior history of gastroparesis.  Patient admitted with intractable nausea and vomiting, hyperglycemia and community-acquired pneumonia.   Assessment & Plan:   Principal Problem:   Nausea & vomiting Active Problems:   BMI 33.0-33.9,adult   Chronic hypertension   AKI (acute kidney injury) (HCC)   Hyponatremia   Insulin dependent type 2 diabetes mellitus (HCC)   Lobar pneumonia (HCC)   1-Nausea, vomiting, abdominal pain: -Secondary to gastroenteritis vs Gastroparesis.  -PRN Zofran. Advanced diet.  -She develops recurrence of symptoms yesterday afternoon.  -Plan to schedule Zofran. Continue with IV fluids.  -PRN Zofran.   2-Community-acquired pneumonia: -Present with leukocytosis, cough, chest x ray: There is small patchy infiltrate in right upper lung field suggesting pneumonia. -WBC trending down--38--22 -Continue with IV ceftriaxone and Azithromycin.  -Legionella antigen pending and strep Pneumonia: negative.   3-Diabetes type 2 with hyperglycemia uncontrolled: Not in DKA.  Continue with SSI.  Holding long acting, she hasn't been abel to tolerate diet.   4-Hypertension, uncontrolled: Holding BP meds in setting vomiting.   Hypokalemia: replete IV  Hyponatremia: received IV fluids.   AKI: Cr baseline 0.7, presents with cr 1.2  Continue with IV fluids.   Obesity: life style modification.     Estimated body mass index is 29.9 kg/m as calculated from the following:   Height as of this encounter: 5\' 7"  (1.702 m).   Weight as of this encounter: 86.6 kg.   DVT  prophylaxis: SCD Code Status: Full code Family Communication: Care discussed with patient.  Disposition Plan:  Status is: Inpatient Remains inpatient appropriate because: management of hyperglycemia, PNA    Consultants:  None  Procedures:  none  Antimicrobials:    Subjective: She is sleepy, received melatonin later in the evening. She couldn't sleep.   Objective: Vitals:   03/16/23 2034 03/17/23 0032 03/17/23 0437 03/17/23 0759  BP: (!) 165/101 (!) 147/95 105/69 (!) 140/86  Pulse: (!) 107 (!) 106 92 91  Resp: 18 18 18 18   Temp: 98.8 F (37.1 C) 98.1 F (36.7 C) 97.9 F (36.6 C) (!) 97.4 F (36.3 C)  TempSrc: Oral Oral Oral Oral  SpO2: 100% 99% 97% 98%  Weight:      Height:        Intake/Output Summary (Last 24 hours) at 03/17/2023 1302 Last data filed at 03/17/2023 0900 Gross per 24 hour  Intake 715 ml  Output 450 ml  Net 265 ml    Filed Weights   03/15/23 1739 03/15/23 2338  Weight: 86.6 kg 86.6 kg    Examination:  General exam: NAD Respiratory system: CTA Cardiovascular system: S 1, S 2 RRR Gastrointestinal system: BS present, soft, nt Central nervous system: sleepy  Extremities: no edema   Data Reviewed: I have personally reviewed following labs and imaging studies  CBC: Recent Labs  Lab 03/13/23 0850 03/15/23 1823 03/15/23 2145 03/16/23 0450 03/17/23 0309  WBC 15.2* 38.6*  --  38.2* 22.7*  NEUTROABS 11.8* 35.2*  --   --   --   HGB 12.4 12.1 11.9* 9.8* 10.7*  HCT 36.4 36.6 35.0* 29.1* 31.3*  MCV 87 88.6  --  83.6 86.2  PLT 421 503*  --  457* 506*    Basic Metabolic Panel: Recent Labs  Lab 03/13/23 0850 03/15/23 1823 03/15/23 2145 03/16/23 0450 03/17/23 0309  NA 136 130* 133* 134* 133*  K 4.5 3.9 3.9 3.1* 3.3*  CL 96 95*  --  99 97*  CO2 24 22  --  24 25  GLUCOSE 384* 299*  --  147* 163*  BUN 17 24*  --  19 10  CREATININE 1.02* 1.24*  --  1.07* 0.74  CALCIUM 9.0 8.4*  --  8.0* 8.0*    GFR: Estimated Creatinine  Clearance: 110.9 mL/min (by C-G formula based on SCr of 0.74 mg/dL). Liver Function Tests: Recent Labs  Lab 03/13/23 0850 03/15/23 1823 03/16/23 0450  AST 15 13* 10*  ALT 20 15 14   ALKPHOS 167* 148* 116  BILITOT 0.4 0.6 0.8  PROT 7.5 8.1 6.8  ALBUMIN 3.0* 1.7* <1.5*    Recent Labs  Lab 03/15/23 1823  LIPASE 19    No results for input(s): "AMMONIA" in the last 168 hours. Coagulation Profile: No results for input(s): "INR", "PROTIME" in the last 168 hours. Cardiac Enzymes: No results for input(s): "CKTOTAL", "CKMB", "CKMBINDEX", "TROPONINI" in the last 168 hours. BNP (last 3 results) No results for input(s): "PROBNP" in the last 8760 hours. HbA1C: Recent Labs    03/16/23 0450  HGBA1C 11.9*    CBG: Recent Labs  Lab 03/16/23 1147 03/16/23 1701 03/16/23 2035 03/17/23 0759 03/17/23 1211  GLUCAP 216* 220* 144* 98 181*    Lipid Profile: No results for input(s): "CHOL", "HDL", "LDLCALC", "TRIG", "CHOLHDL", "LDLDIRECT" in the last 72 hours.  Thyroid Function Tests: No results for input(s): "TSH", "T4TOTAL", "FREET4", "T3FREE", "THYROIDAB" in the last 72 hours.  Anemia Panel: No results for input(s): "VITAMINB12", "FOLATE", "FERRITIN", "TIBC", "IRON", "RETICCTPCT" in the last 72 hours. Sepsis Labs: Recent Labs  Lab 03/15/23 2228 03/16/23 0450  LATICACIDVEN 1.1 1.0     No results found for this or any previous visit (from the past 240 hour(s)).       Radiology Studies: DG Chest 2 View  Result Date: 03/15/2023 CLINICAL DATA:  Pneumonia, cough EXAM: CHEST - 2 VIEW COMPARISON:  Previous studies including the examination of 03/13/2023 FINDINGS: There is patchy infiltrate in right upper lung field with no significant change. Cardiac size is within normal limits. There are no signs of pulmonary edema or new focal infiltrates. There is no pleural effusion or pneumothorax. IMPRESSION: There is small patchy infiltrate in right upper lung field suggesting pneumonia.  There are no new infiltrates or signs of pulmonary edema. There is no pleural effusion. Electronically Signed   By: Ernie Avena M.D.   On: 03/15/2023 19:15        Scheduled Meds:  enoxaparin (LOVENOX) injection  40 mg Subcutaneous Q24H   guaiFENesin  600 mg Oral BID   insulin aspart  0-15 Units Subcutaneous TID WC   insulin aspart  0-5 Units Subcutaneous QHS   metoCLOPramide (REGLAN) injection  5 mg Intravenous Q8H   pantoprazole  40 mg Oral Daily   Continuous Infusions:  azithromycin 500 mg (03/16/23 1806)   cefTRIAXone (ROCEPHIN)  IV 2 g (03/16/23 1718)   lactated ringers 100 mL/hr at 03/17/23 0138   potassium chloride 10 mEq (03/17/23 1212)     LOS: 2 days    Time spent: 35 Minutes    Blayke Cordrey A Omie Ferger, MD [facility]   If 7PM-7AM, please contact night-coverage [email or web address]  03/17/2023, 1:02 PM

## 2023-03-18 DIAGNOSIS — J181 Lobar pneumonia, unspecified organism: Secondary | ICD-10-CM | POA: Diagnosis not present

## 2023-03-18 LAB — BASIC METABOLIC PANEL
Anion gap: 10 (ref 5–15)
BUN: 9 mg/dL (ref 6–20)
CO2: 24 mmol/L (ref 22–32)
Calcium: 7.8 mg/dL — ABNORMAL LOW (ref 8.9–10.3)
Chloride: 100 mmol/L (ref 98–111)
Creatinine, Ser: 0.86 mg/dL (ref 0.44–1.00)
GFR, Estimated: >60 mL/min (ref >60)
Glucose, Bld: 187 mg/dL — ABNORMAL HIGH (ref 70–99)
Potassium: 3.2 mmol/L — ABNORMAL LOW (ref 3.5–5.1)
Sodium: 134 mmol/L — ABNORMAL LOW (ref 135–145)

## 2023-03-18 LAB — MAGNESIUM: Magnesium: 1.6 mg/dL — ABNORMAL LOW (ref 1.7–2.4)

## 2023-03-18 LAB — CBC
HCT: 31.6 % — ABNORMAL LOW (ref 36.0–46.0)
Hemoglobin: 10.4 g/dL — ABNORMAL LOW (ref 12.0–15.0)
MCH: 28 pg (ref 26.0–34.0)
MCHC: 32.9 g/dL (ref 30.0–36.0)
MCV: 85.2 fL (ref 80.0–100.0)
Platelets: 537 10*3/uL — ABNORMAL HIGH (ref 150–400)
RBC: 3.71 MIL/uL — ABNORMAL LOW (ref 3.87–5.11)
RDW: 12.3 % (ref 11.5–15.5)
WBC: 17.7 10*3/uL — ABNORMAL HIGH (ref 4.0–10.5)
nRBC: 0 % (ref 0.0–0.2)

## 2023-03-18 MED ORDER — POTASSIUM CHLORIDE 10 MEQ/100ML IV SOLN
10.0000 meq | INTRAVENOUS | Status: AC
  Administered 2023-03-18: 10 meq via INTRAVENOUS
  Filled 2023-03-18: qty 100

## 2023-03-18 MED ORDER — METOCLOPRAMIDE HCL 5 MG PO TABS: 5.0000 mg | ORAL_TABLET | Freq: Three times a day (TID) | ORAL | 0 refills | Status: DC

## 2023-03-18 MED ORDER — AZITHROMYCIN 500 MG PO TABS: 500.0000 mg | ORAL_TABLET | Freq: Every day | ORAL | 0 refills | Status: AC

## 2023-03-18 MED ORDER — AMOXICILLIN-POT CLAVULANATE 875-125 MG PO TABS: 1.0000 | ORAL_TABLET | Freq: Two times a day (BID) | ORAL | 0 refills | Status: AC

## 2023-03-18 MED ORDER — GUAIFENESIN ER 600 MG PO TB12: 600.0000 mg | ORAL_TABLET | Freq: Two times a day (BID) | ORAL | 0 refills | Status: DC

## 2023-03-18 MED ORDER — ONDANSETRON HCL 4 MG PO TABS: 4.0000 mg | ORAL_TABLET | Freq: Four times a day (QID) | ORAL | 0 refills | Status: DC | PRN

## 2023-03-18 MED ORDER — POTASSIUM CHLORIDE CRYS ER 20 MEQ PO TBCR: 40.0000 meq | EXTENDED_RELEASE_TABLET | Freq: Once | ORAL | Status: DC

## 2023-03-18 MED ORDER — METOCLOPRAMIDE HCL 5 MG PO TABS: 5.0000 mg | ORAL_TABLET | Freq: Three times a day (TID) | ORAL | Status: DC

## 2023-03-18 MED ORDER — PANTOPRAZOLE SODIUM 40 MG PO TBEC: 40.0000 mg | DELAYED_RELEASE_TABLET | Freq: Every day | ORAL | 0 refills | Status: DC

## 2023-03-18 NOTE — Plan of Care (Signed)

## 2023-03-18 NOTE — Discharge Summary (Signed)
Physician Discharge Summary   Patient: [name] MRN: [id number] DOB: Jul 09, 1986  Admit date:     03/15/2023  Discharge date: 03/18/23  Discharge Physician: Alba Cory   PCP: Martina Sinner, NP   Recommendations at discharge:    Needs CBC to follow up resolution of leukocytosis.  Follow up resolution of PNA>   Discharge Diagnoses: Principal Problem:   Nausea & vomiting Active Problems:   BMI 33.0-33.9,adult   Chronic hypertension   AKI (acute kidney injury) (HCC)   Hyponatremia   Insulin dependent type 2 diabetes mellitus (HCC)   Lobar pneumonia (HCC)   Community acquired pneumonia of right upper lobe of lung  Resolved Problems:   * No resolved hospital problems. *  Hospital Course: 37 year old with past medical history significant for insulin-dependent diabetes type 2, hypertension, morbid obesity, chronic hyponatremia presented to ED complaining of intractable nausea and vomiting.  She also reports abdominal pain.  Denies diarrhea.  She reports a prior history of gastroparesis.   Patient admitted with intractable nausea and vomiting, hyperglycemia and community-acquired pneumonia.  Assessment and Plan: 1-Nausea, vomiting, abdominal pain: -Secondary to  Gastroparesis.  -PRN Zofran. Advanced diet.  -She develops recurrence of symptoms yesterday afternoon. Started on low dose reglan. -Plan to schedule Zofran. Treated  with IV fluids.  -PRN Zofran.  -Tolerating diet today. Plan to discharge on short course of low dose Reglan.   2-Community-acquired pneumonia: -Present with leukocytosis, cough, chest x ray: There is small patchy infiltrate in right upper lung field suggesting pneumonia. -WBC trending down--38--22---17 -Treated with IV ceftriaxone and Azithromycin for 3 days. Discharge on Augmentin for 5 days and Zithromax for 3 days.  -Legionella antigen pending and strep Pneumonia: negative.   denies cough, tolerating diet, WBC down to 17. Afebrile.    3-Diabetes type 2 with hyperglycemia uncontrolled: Not in DKA.  Continue with SSI.  Resume long acting insulin.   4-Hypertension, uncontrolled: resume nifedipine at discharge   Hypokalemia: Replete IV and orally.    Hyponatremia: received IV fluids.    AKI: Cr baseline 0.7, presents with cr 1.2  Continue with IV fluids.    Obesity: life style modification.        Consultants: None Procedures performed: None Disposition: Home Diet recommendation:  Discharge Diet Orders (From admission, onward)     Start     Ordered   03/18/23 0000  Diet Carb Modified        03/18/23 1045           Carb modified diet DISCHARGE MEDICATION: Allergies as of 03/18/2023       Reactions   Almond Oil Hives   Almond (diagnostic) Hives   Bee Venom Hives   Limonene Hives        Medication List     STOP taking these medications    Kerendia 10 MG Tabs Generic drug: Finerenone       TAKE these medications    amoxicillin-clavulanate 875-125 MG tablet Commonly known as: AUGMENTIN Take 1 tablet by mouth 2 (two) times daily for 5 days.   atorvastatin 10 MG tablet Commonly known as: Lipitor Take 1 tablet (10 mg total) by mouth daily.   azithromycin 500 MG tablet Commonly known as: Zithromax Take 1 tablet (500 mg total) by mouth daily for 3 days.   cetirizine 10 MG tablet Commonly known as: ZYRTEC Take 1 tablet (10 mg total) by mouth daily.   fish oil-omega-3 fatty acids 1000 MG capsule Take 2 capsules (2 g  total) by mouth daily.   FreeStyle Libre 3 Sensor Misc 1 Box by Does not apply route every 14 (fourteen) days. Place 1 sensor on the skin every 14 days. Use to check glucose continuously   guaiFENesin 600 MG 12 hr tablet Commonly known as: MUCINEX Take 1 tablet (600 mg total) by mouth 2 (two) times daily.   Lantus SoloStar 100 UNIT/ML Solostar Pen Generic drug: insulin glargine Inject 15 Units into the skin at bedtime. Patient is taking only 10 unit What  changed:  how much to take additional instructions   metFORMIN 500 MG 24 hr tablet Commonly known as: GLUCOPHAGE-XR Take 1 tablet (500 mg total) by mouth daily with breakfast.   metoCLOPramide 5 MG tablet Commonly known as: REGLAN Take 1 tablet (5 mg total) by mouth 3 (three) times daily before meals for 10 days.   NIFEdipine 30 MG 24 hr tablet Commonly known as: ADALAT CC Take 1 tablet (30 mg total) by mouth daily.   NovoLOG FlexPen 100 UNIT/ML FlexPen Generic drug: insulin aspart Inject 5 Units into the skin 3 (three) times daily with meals. Also can use sliding scale if needed. Patient is taking 3 U at dinner and 5 unit Breakfast and lunch What changed:  how much to take when to take this additional instructions   ondansetron 4 MG tablet Commonly known as: ZOFRAN Take 1 tablet (4 mg total) by mouth every 6 (six) hours as needed for nausea. What changed:  when to take this reasons to take this   Ozempic (0.25 or 0.5 MG/DOSE) 2 MG/3ML Sopn Generic drug: Semaglutide(0.25 or 0.5MG /DOS) Inject 0.25 mg into the skin every 7 (seven) days.   pantoprazole 40 MG tablet Commonly known as: PROTONIX Take 1 tablet (40 mg total) by mouth daily.        Discharge Exam: Filed Weights   03/15/23 1739 03/15/23 2338  Weight: 86.6 kg 86.6 kg   General; NAD Lungs; CTA Extremity; right BKA.   Condition at discharge: stable  The results of significant diagnostics from this hospitalization (including imaging, microbiology, ancillary and laboratory) are listed below for reference.   Imaging Studies: DG Chest 2 View  Result Date: 03/15/2023 CLINICAL DATA:  Pneumonia, cough EXAM: CHEST - 2 VIEW COMPARISON:  Previous studies including the examination of 03/13/2023 FINDINGS: There is patchy infiltrate in right upper lung field with no significant change. Cardiac size is within normal limits. There are no signs of pulmonary edema or new focal infiltrates. There is no pleural effusion or  pneumothorax. IMPRESSION: There is small patchy infiltrate in right upper lung field suggesting pneumonia. There are no new infiltrates or signs of pulmonary edema. There is no pleural effusion. Electronically Signed   By: Ernie Avena M.D.   On: 03/15/2023 19:15    Microbiology: Results for orders placed or performed during the hospital encounter of 12/20/20  SARS CORONAVIRUS 2 (TAT 6-24 HRS) Nasopharyngeal Nasopharyngeal Swab     Status: None   Collection Time: 12/20/20  9:39 AM   Specimen: Nasopharyngeal Swab  Result Value Ref Range Status   SARS Coronavirus 2 NEGATIVE NEGATIVE Final    Comment: (NOTE) SARS-CoV-2 target nucleic acids are NOT DETECTED.  The SARS-CoV-2 RNA is generally detectable in upper and lower respiratory specimens during the acute phase of infection. Negative results do not preclude SARS-CoV-2 infection, do not rule out co-infections with other pathogens, and should not be used as the sole basis for treatment or other patient management decisions. Negative results must be  combined with clinical observations, patient history, and epidemiological information. The expected result is Negative.  Fact Sheet for Patients: HairSlick.no  Fact Sheet for Healthcare Providers: quierodirigir.com  This test is not yet approved or cleared by the [place] FDA and  has been authorized for detection and/or diagnosis of SARS-CoV-2 by FDA under an Emergency Use Authorization (EUA). This EUA will remain  in effect (meaning this test can be used) for the duration of the COVID-19 declaration under Se ction 564(b)(1) of the Act, 21 U.S.C. section 360bbb-3(b)(1), unless the authorization is terminated or revoked sooner.  Performed at [facility], [address], [place], [place] [address]     Labs: CBC: Recent Labs  Lab 03/13/23 0850 03/15/23 1823 03/15/23 2145 03/16/23 0450 03/17/23 0309  03/18/23 0445  WBC 15.2* 38.6*  --  38.2* 22.7* 17.7*  NEUTROABS 11.8* 35.2*  --   --   --   --   HGB 12.4 12.1 11.9* 9.8* 10.7* 10.4*  HCT 36.4 36.6 35.0* 29.1* 31.3* 31.6*  MCV 87 88.6  --  83.6 86.2 85.2  PLT 421 503*  --  457* 506* 537*   Basic Metabolic Panel: Recent Labs  Lab 03/13/23 0850 03/15/23 1823 03/15/23 2145 03/16/23 0450 03/17/23 0309 03/18/23 0445  NA 136 130* 133* 134* 133* 134*  K 4.5 3.9 3.9 3.1* 3.3* 3.2*  CL 96 95*  --  99 97* 100  CO2 24 22  --  24 25 24   GLUCOSE 384* 299*  --  147* 163* 187*  BUN 17 24*  --  19 10 9   CREATININE 1.02* 1.24*  --  1.07* 0.74 0.86  CALCIUM 9.0 8.4*  --  8.0* 8.0* 7.8*  MG  --   --   --   --   --  1.6*   Liver Function Tests: Recent Labs  Lab 03/13/23 0850 03/15/23 1823 03/16/23 0450  AST 15 13* 10*  ALT 20 15 14   ALKPHOS 167* 148* 116  BILITOT 0.4 0.6 0.8  PROT 7.5 8.1 6.8  ALBUMIN 3.0* 1.7* <1.5*   CBG: Recent Labs  Lab 03/16/23 2035 03/17/23 0759 03/17/23 1211 03/17/23 1700 03/17/23 2040  GLUCAP 144* 98 181* 167* 173*    Discharge time spent: greater than 30 minutes.  Signed: Alba Cory, MD [facility] 03/18/2023

## 2023-03-19 LAB — LEGIONELLA PNEUMOPHILA SEROGP 1 UR AG: L. pneumophila Serogp 1 Ur Ag: NEGATIVE

## 2023-05-03 ENCOUNTER — Encounter: Payer: Self-pay | Admitting: *Deleted

## 2023-06-20 ENCOUNTER — Ambulatory Visit: Payer: Medicaid Other | Admitting: Nurse Practitioner

## 2023-06-25 ENCOUNTER — Ambulatory Visit: Payer: Medicaid Other | Admitting: Nurse Practitioner

## 2023-07-20 ENCOUNTER — Telehealth: Payer: Self-pay

## 2023-07-20 ENCOUNTER — Other Ambulatory Visit (HOSPITAL_COMMUNITY): Payer: Self-pay

## 2023-07-20 NOTE — Telephone Encounter (Signed)
Pharmacy Patient Advocate Encounter  Received notification from [facility] that Prior Authorization for FreeStyle Libre 3 Sensor has been DENIED.  Full denial letter will be uploaded to the media tab. See denial reason below.   PA #/Case ID/Reference #: Key: [id number]

## 2023-07-20 NOTE — Telephone Encounter (Signed)
[name] (Key: [id number]) PA Case ID #: [id number] Rx #: [id number] Need Help? Call us at [phone] Status sent iconSent to Plan today Drug FreeStyle Libre 3 Sensor ePA cloud logo Form [employer] [place] [employer] [place] Form [phone] NCPDP) Original Claim Info [phone]

## 2023-07-20 NOTE — Telephone Encounter (Signed)
Pharmacy Patient Advocate Encounter  Received notification from [facility] that Prior Authorization for [facility] 3 Sensor  has been DENIED.  Full denial letter will be uploaded to the media tab. See denial reason below.          PA #/Case ID/Reference #: Key: [id number]

## 2023-07-24 NOTE — Telephone Encounter (Signed)
Per denial, documentation showing patient has improved glycemic control is required. Last lab work and office visit was in June when it was first prescribed.    Please be advised we currently do not have a Pharmacist to review denials, therefore you will need to process appeals accordingly as needed. Thanks for your support at this time. Contact for appeals are as follows: Phone: [phone] (peer-to-peer review)

## 2023-07-24 NOTE — Telephone Encounter (Signed)
PATIENT AWARE AND FOLLOW UP SCHEDULED 

## 2023-08-02 ENCOUNTER — Ambulatory Visit: Payer: Medicaid Other | Admitting: Nurse Practitioner

## 2023-12-05 ENCOUNTER — Ambulatory Visit: Payer: Medicaid Other | Admitting: Family Medicine

## 2024-06-30 NOTE — Progress Notes (Signed)
 Ds collected

## 2024-07-19 ENCOUNTER — Emergency Department (HOSPITAL_COMMUNITY)

## 2024-07-19 ENCOUNTER — Inpatient Hospital Stay (HOSPITAL_COMMUNITY)
Admission: EM | Admit: 2024-07-19 | Discharge: 2024-07-27 | DRG: 463 | Disposition: A | Discharge status: Home / Self Care | Attending: Internal Medicine | Admitting: Internal Medicine

## 2024-07-19 ENCOUNTER — Encounter (HOSPITAL_COMMUNITY): Payer: Self-pay | Admitting: Emergency Medicine

## 2024-07-19 DIAGNOSIS — Z79899 Other long term (current) drug therapy: Secondary | ICD-10-CM

## 2024-07-19 DIAGNOSIS — Z6833 Body mass index (BMI) 33.0-33.9, adult: Secondary | ICD-10-CM

## 2024-07-19 DIAGNOSIS — Y835 Amputation of limb(s) as the cause of abnormal reaction of the patient, or of later complication, without mention of misadventure at the time of the procedure: Secondary | ICD-10-CM | POA: Diagnosis present

## 2024-07-19 DIAGNOSIS — N179 Acute kidney failure, unspecified: Secondary | ICD-10-CM | POA: Diagnosis present

## 2024-07-19 DIAGNOSIS — E876 Hypokalemia: Secondary | ICD-10-CM | POA: Diagnosis present

## 2024-07-19 DIAGNOSIS — Z91048 Other nonmedicinal substance allergy status: Secondary | ICD-10-CM

## 2024-07-19 DIAGNOSIS — T8743 Infection of amputation stump, right lower extremity: Principal | ICD-10-CM | POA: Diagnosis present

## 2024-07-19 DIAGNOSIS — L03115 Cellulitis of right lower limb: Secondary | ICD-10-CM | POA: Diagnosis present

## 2024-07-19 DIAGNOSIS — R591 Generalized enlarged lymph nodes: Secondary | ICD-10-CM | POA: Diagnosis present

## 2024-07-19 DIAGNOSIS — A491 Streptococcal infection, unspecified site: Secondary | ICD-10-CM

## 2024-07-19 DIAGNOSIS — R7989 Other specified abnormal findings of blood chemistry: Secondary | ICD-10-CM | POA: Diagnosis present

## 2024-07-19 DIAGNOSIS — R748 Abnormal levels of other serum enzymes: Secondary | ICD-10-CM | POA: Diagnosis present

## 2024-07-19 DIAGNOSIS — M869 Osteomyelitis, unspecified: Principal | ICD-10-CM

## 2024-07-19 DIAGNOSIS — M86461 Chronic osteomyelitis with draining sinus, right tibia and fibula: Secondary | ICD-10-CM | POA: Diagnosis present

## 2024-07-19 DIAGNOSIS — Z794 Long term (current) use of insulin: Secondary | ICD-10-CM

## 2024-07-19 DIAGNOSIS — E66811 Obesity, class 1: Secondary | ICD-10-CM | POA: Diagnosis present

## 2024-07-19 DIAGNOSIS — T879 Unspecified complications of amputation stump: Secondary | ICD-10-CM | POA: Diagnosis present

## 2024-07-19 DIAGNOSIS — L02415 Cutaneous abscess of right lower limb: Secondary | ICD-10-CM | POA: Diagnosis present

## 2024-07-19 DIAGNOSIS — Z8249 Family history of ischemic heart disease and other diseases of the circulatory system: Secondary | ICD-10-CM

## 2024-07-19 DIAGNOSIS — A419 Sepsis, unspecified organism: Secondary | ICD-10-CM | POA: Diagnosis present

## 2024-07-19 DIAGNOSIS — D638 Anemia in other chronic diseases classified elsewhere: Secondary | ICD-10-CM | POA: Diagnosis present

## 2024-07-19 DIAGNOSIS — Z883 Allergy status to other anti-infective agents status: Secondary | ICD-10-CM

## 2024-07-19 DIAGNOSIS — Z833 Family history of diabetes mellitus: Secondary | ICD-10-CM

## 2024-07-19 DIAGNOSIS — E1165 Type 2 diabetes mellitus with hyperglycemia: Secondary | ICD-10-CM | POA: Diagnosis present

## 2024-07-19 DIAGNOSIS — E1169 Type 2 diabetes mellitus with other specified complication: Secondary | ICD-10-CM | POA: Diagnosis present

## 2024-07-19 DIAGNOSIS — T8781 Dehiscence of amputation stump: Secondary | ICD-10-CM | POA: Diagnosis present

## 2024-07-19 DIAGNOSIS — M79601 Pain in right arm: Secondary | ICD-10-CM | POA: Diagnosis not present

## 2024-07-19 DIAGNOSIS — D62 Acute posthemorrhagic anemia: Secondary | ICD-10-CM | POA: Diagnosis not present

## 2024-07-19 DIAGNOSIS — I10 Essential (primary) hypertension: Secondary | ICD-10-CM | POA: Diagnosis present

## 2024-07-19 DIAGNOSIS — Z91018 Allergy to other foods: Secondary | ICD-10-CM

## 2024-07-19 DIAGNOSIS — B951 Streptococcus, group B, as the cause of diseases classified elsewhere: Secondary | ICD-10-CM | POA: Diagnosis present

## 2024-07-19 DIAGNOSIS — Z91030 Bee allergy status: Secondary | ICD-10-CM

## 2024-07-19 LAB — COMPREHENSIVE METABOLIC PANEL WITH GFR
ALT: 62 U/L — ABNORMAL HIGH (ref 0–44)
AST: 90 U/L — ABNORMAL HIGH (ref 15–41)
Albumin: 1.6 g/dL — ABNORMAL LOW (ref 3.5–5.0)
Alkaline Phosphatase: 157 U/L — ABNORMAL HIGH (ref 38–126)
Anion gap: 12 (ref 5–15)
BUN: 20 mg/dL (ref 6–20)
CO2: 24 mmol/L (ref 22–32)
Calcium: 8.3 mg/dL — ABNORMAL LOW (ref 8.9–10.3)
Chloride: 95 mmol/L — ABNORMAL LOW (ref 98–111)
Creatinine, Ser: 1.47 mg/dL — ABNORMAL HIGH (ref 0.44–1.00)
GFR, Estimated: 47 mL/min — ABNORMAL LOW (ref >60)
Glucose, Bld: 154 mg/dL — ABNORMAL HIGH (ref 70–99)
Potassium: 3.4 mmol/L — ABNORMAL LOW (ref 3.5–5.1)
Sodium: 131 mmol/L — ABNORMAL LOW (ref 135–145)
Total Bilirubin: 0.8 mg/dL (ref 0.0–1.2)
Total Protein: 7.4 g/dL (ref 6.5–8.1)

## 2024-07-19 LAB — CBC
HCT: 31.9 % — ABNORMAL LOW (ref 36.0–46.0)
Hemoglobin: 10.4 g/dL — ABNORMAL LOW (ref 12.0–15.0)
MCH: 28.6 pg (ref 26.0–34.0)
MCHC: 32.6 g/dL (ref 30.0–36.0)
MCV: 87.6 fL (ref 80.0–100.0)
Platelets: 469 K/uL — ABNORMAL HIGH (ref 150–400)
RBC: 3.64 MIL/uL — ABNORMAL LOW (ref 3.87–5.11)
RDW: 12.8 % (ref 11.5–15.5)
WBC: 16 K/uL — ABNORMAL HIGH (ref 4.0–10.5)
nRBC: 0 % (ref 0.0–0.2)

## 2024-07-19 LAB — I-STAT CG4 LACTIC ACID, ED: Lactic Acid, Venous: 0.9 mmol/L (ref 0.5–1.9)

## 2024-07-19 NOTE — ED Triage Notes (Signed)
 Pt sent here from [facility] with c/o right leg wound that has been ongoing for a month , pt states that the leg started swelling and has a fever today

## 2024-07-20 ENCOUNTER — Inpatient Hospital Stay (HOSPITAL_COMMUNITY)

## 2024-07-20 ENCOUNTER — Other Ambulatory Visit: Payer: Self-pay

## 2024-07-20 DIAGNOSIS — Z8619 Personal history of other infectious and parasitic diseases: Secondary | ICD-10-CM | POA: Diagnosis not present

## 2024-07-20 DIAGNOSIS — I7090 Unspecified atherosclerosis: Secondary | ICD-10-CM | POA: Diagnosis not present

## 2024-07-20 DIAGNOSIS — Z91030 Bee allergy status: Secondary | ICD-10-CM | POA: Diagnosis not present

## 2024-07-20 DIAGNOSIS — M7989 Other specified soft tissue disorders: Secondary | ICD-10-CM | POA: Diagnosis not present

## 2024-07-20 DIAGNOSIS — M79601 Pain in right arm: Secondary | ICD-10-CM | POA: Diagnosis not present

## 2024-07-20 DIAGNOSIS — Z89511 Acquired absence of right leg below knee: Secondary | ICD-10-CM | POA: Diagnosis not present

## 2024-07-20 DIAGNOSIS — T8789 Other complications of amputation stump: Secondary | ICD-10-CM | POA: Diagnosis present

## 2024-07-20 DIAGNOSIS — T879 Unspecified complications of amputation stump: Secondary | ICD-10-CM | POA: Diagnosis present

## 2024-07-20 DIAGNOSIS — Z794 Long term (current) use of insulin: Secondary | ICD-10-CM | POA: Diagnosis not present

## 2024-07-20 DIAGNOSIS — Z833 Family history of diabetes mellitus: Secondary | ICD-10-CM | POA: Diagnosis not present

## 2024-07-20 DIAGNOSIS — Y835 Amputation of limb(s) as the cause of abnormal reaction of the patient, or of later complication, without mention of misadventure at the time of the procedure: Secondary | ICD-10-CM | POA: Diagnosis present

## 2024-07-20 DIAGNOSIS — T8781 Dehiscence of amputation stump: Secondary | ICD-10-CM | POA: Diagnosis present

## 2024-07-20 DIAGNOSIS — E876 Hypokalemia: Secondary | ICD-10-CM | POA: Diagnosis present

## 2024-07-20 DIAGNOSIS — Z79899 Other long term (current) drug therapy: Secondary | ICD-10-CM | POA: Diagnosis not present

## 2024-07-20 DIAGNOSIS — N179 Acute kidney failure, unspecified: Secondary | ICD-10-CM | POA: Diagnosis present

## 2024-07-20 DIAGNOSIS — E1165 Type 2 diabetes mellitus with hyperglycemia: Secondary | ICD-10-CM | POA: Diagnosis present

## 2024-07-20 DIAGNOSIS — L03115 Cellulitis of right lower limb: Secondary | ICD-10-CM | POA: Diagnosis present

## 2024-07-20 DIAGNOSIS — E66811 Obesity, class 1: Secondary | ICD-10-CM | POA: Diagnosis present

## 2024-07-20 DIAGNOSIS — I10 Essential (primary) hypertension: Secondary | ICD-10-CM | POA: Diagnosis present

## 2024-07-20 DIAGNOSIS — L02415 Cutaneous abscess of right lower limb: Secondary | ICD-10-CM | POA: Diagnosis present

## 2024-07-20 DIAGNOSIS — D638 Anemia in other chronic diseases classified elsewhere: Secondary | ICD-10-CM | POA: Diagnosis present

## 2024-07-20 DIAGNOSIS — Z8249 Family history of ischemic heart disease and other diseases of the circulatory system: Secondary | ICD-10-CM | POA: Diagnosis not present

## 2024-07-20 DIAGNOSIS — D62 Acute posthemorrhagic anemia: Secondary | ICD-10-CM | POA: Diagnosis not present

## 2024-07-20 DIAGNOSIS — A419 Sepsis, unspecified organism: Secondary | ICD-10-CM

## 2024-07-20 DIAGNOSIS — M86461 Chronic osteomyelitis with draining sinus, right tibia and fibula: Secondary | ICD-10-CM | POA: Diagnosis present

## 2024-07-20 DIAGNOSIS — Z6833 Body mass index (BMI) 33.0-33.9, adult: Secondary | ICD-10-CM | POA: Diagnosis not present

## 2024-07-20 DIAGNOSIS — T8743 Infection of amputation stump, right lower extremity: Secondary | ICD-10-CM | POA: Diagnosis present

## 2024-07-20 DIAGNOSIS — A491 Streptococcal infection, unspecified site: Secondary | ICD-10-CM | POA: Diagnosis not present

## 2024-07-20 DIAGNOSIS — E871 Hypo-osmolality and hyponatremia: Secondary | ICD-10-CM | POA: Diagnosis not present

## 2024-07-20 DIAGNOSIS — R7989 Other specified abnormal findings of blood chemistry: Secondary | ICD-10-CM | POA: Diagnosis present

## 2024-07-20 DIAGNOSIS — B951 Streptococcus, group B, as the cause of diseases classified elsewhere: Secondary | ICD-10-CM | POA: Diagnosis present

## 2024-07-20 DIAGNOSIS — E1169 Type 2 diabetes mellitus with other specified complication: Secondary | ICD-10-CM | POA: Diagnosis present

## 2024-07-20 LAB — RETICULOCYTES
Immature Retic Fract: 10 % (ref 2.3–15.9)
RBC.: 3.47 MIL/uL — ABNORMAL LOW (ref 3.87–5.11)
Retic Count, Absolute: 41.6 K/uL (ref 19.0–186.0)
Retic Ct Pct: 1.2 % (ref 0.4–3.1)

## 2024-07-20 LAB — COMPREHENSIVE METABOLIC PANEL WITH GFR
ALT: 45 U/L — ABNORMAL HIGH (ref 0–44)
AST: 31 U/L (ref 15–41)
Albumin: <1.5 g/dL — ABNORMAL LOW (ref 3.5–5.0)
Alkaline Phosphatase: 125 U/L (ref 38–126)
Anion gap: 11 (ref 5–15)
BUN: 21 mg/dL — ABNORMAL HIGH (ref 6–20)
CO2: 25 mmol/L (ref 22–32)
Calcium: 7.9 mg/dL — ABNORMAL LOW (ref 8.9–10.3)
Chloride: 98 mmol/L (ref 98–111)
Creatinine, Ser: 1.49 mg/dL — ABNORMAL HIGH (ref 0.44–1.00)
GFR, Estimated: 46 mL/min — ABNORMAL LOW (ref >60)
Glucose, Bld: 183 mg/dL — ABNORMAL HIGH (ref 70–99)
Potassium: 3.5 mmol/L (ref 3.5–5.1)
Sodium: 134 mmol/L — ABNORMAL LOW (ref 135–145)
Total Bilirubin: 0.9 mg/dL (ref 0.0–1.2)
Total Protein: 6.3 g/dL — ABNORMAL LOW (ref 6.5–8.1)

## 2024-07-20 LAB — IRON AND TIBC
Iron: 15 ug/dL — ABNORMAL LOW (ref 28–170)
Saturation Ratios: 11 % (ref 10.4–31.8)
TIBC: 141 ug/dL — ABNORMAL LOW (ref 250–450)
UIBC: 126 ug/dL

## 2024-07-20 LAB — CBC
HCT: 28.3 % — ABNORMAL LOW (ref 36.0–46.0)
Hemoglobin: 9.3 g/dL — ABNORMAL LOW (ref 12.0–15.0)
MCH: 28.8 pg (ref 26.0–34.0)
MCHC: 32.9 g/dL (ref 30.0–36.0)
MCV: 87.6 fL (ref 80.0–100.0)
Platelets: 419 K/uL — ABNORMAL HIGH (ref 150–400)
RBC: 3.23 MIL/uL — ABNORMAL LOW (ref 3.87–5.11)
RDW: 12.8 % (ref 11.5–15.5)
WBC: 13.5 K/uL — ABNORMAL HIGH (ref 4.0–10.5)
nRBC: 0 % (ref 0.0–0.2)

## 2024-07-20 LAB — VITAMIN B12: Vitamin B-12: 476 pg/mL (ref 180–914)

## 2024-07-20 LAB — C-REACTIVE PROTEIN: CRP: 31.2 mg/dL — ABNORMAL HIGH (ref <1.0)

## 2024-07-20 LAB — HEMOGLOBIN A1C
Hgb A1c MFr Bld: 15.2 % — ABNORMAL HIGH (ref 4.8–5.6)
Mean Plasma Glucose: 389.54 mg/dL

## 2024-07-20 LAB — GLUCOSE, CAPILLARY
Glucose-Capillary: 290 mg/dL — ABNORMAL HIGH (ref 70–99)
Glucose-Capillary: 242 mg/dL — ABNORMAL HIGH (ref 70–99)

## 2024-07-20 LAB — CBG MONITORING, ED
Glucose-Capillary: 179 mg/dL — ABNORMAL HIGH (ref 70–99)
Glucose-Capillary: 222 mg/dL — ABNORMAL HIGH (ref 70–99)
Glucose-Capillary: 289 mg/dL — ABNORMAL HIGH (ref 70–99)
Glucose-Capillary: 339 mg/dL — ABNORMAL HIGH (ref 70–99)

## 2024-07-20 LAB — FOLATE: Folate: 14 ng/mL (ref >5.9)

## 2024-07-20 LAB — HIV ANTIBODY (ROUTINE TESTING W REFLEX): HIV Screen 4th Generation wRfx: NONREACTIVE

## 2024-07-20 LAB — MAGNESIUM: Magnesium: 1.9 mg/dL (ref 1.7–2.4)

## 2024-07-20 LAB — CK: Total CK: 33 U/L — ABNORMAL LOW (ref 38–234)

## 2024-07-20 LAB — FERRITIN: Ferritin: 235 ng/mL (ref 11–307)

## 2024-07-20 MED ORDER — SODIUM CHLORIDE 0.9 % IV BOLUS
1000.0000 mL | Freq: Once | INTRAVENOUS | Status: AC
Start: 2024-07-20 — End: 2024-07-20
  Administered 2024-07-20: 1000 mL via INTRAVENOUS

## 2024-07-20 MED ORDER — LINEZOLID 600 MG/300ML IV SOLN
600.0000 mg | Freq: Two times a day (BID) | INTRAVENOUS | Status: DC
  Administered 2024-07-20 – 2024-07-23 (×7): 600 mg via INTRAVENOUS
  Filled 2024-07-20 (×8): qty 300

## 2024-07-20 MED ORDER — INSULIN ASPART 100 UNIT/ML IJ SOLN
0.0000 [IU] | Freq: Every day | INTRAMUSCULAR | Status: DC
  Administered 2024-07-20 – 2024-07-22 (×2): 2 [IU] via SUBCUTANEOUS
  Administered 2024-07-23: 5 [IU] via SUBCUTANEOUS
  Administered 2024-07-24: 2 [IU] via SUBCUTANEOUS

## 2024-07-20 MED ORDER — POTASSIUM CHLORIDE CRYS ER 20 MEQ PO TBCR: 20.0000 meq | EXTENDED_RELEASE_TABLET | Freq: Two times a day (BID) | ORAL | Status: DC

## 2024-07-20 MED ORDER — SODIUM CHLORIDE 0.9 % IV SOLN: INTRAVENOUS | Status: DC

## 2024-07-20 MED ORDER — ACETAMINOPHEN 325 MG PO TABS
650.0000 mg | ORAL_TABLET | Freq: Once | ORAL | Status: AC
  Administered 2024-07-20: 650 mg via ORAL
  Filled 2024-07-20: qty 2

## 2024-07-20 MED ORDER — PNEUMOCOCCAL 20-VAL CONJ VACC 0.5 ML IM SUSY
0.5000 mL | PREFILLED_SYRINGE | INTRAMUSCULAR | Status: DC
  Filled 2024-07-20: qty 0.5

## 2024-07-20 MED ORDER — LOPERAMIDE HCL 2 MG PO CAPS
2.0000 mg | ORAL_CAPSULE | ORAL | Status: DC | PRN
  Administered 2024-07-20 – 2024-07-22 (×2): 2 mg via ORAL
  Filled 2024-07-20 (×2): qty 1

## 2024-07-20 MED ORDER — ONDANSETRON 4 MG PO TBDP
4.0000 mg | ORAL_TABLET | Freq: Once | ORAL | Status: AC
  Administered 2024-07-20: 4 mg via ORAL
  Filled 2024-07-20: qty 1

## 2024-07-20 MED ORDER — INSULIN ASPART 100 UNIT/ML IJ SOLN
0.0000 [IU] | INTRAMUSCULAR | Status: DC
  Administered 2024-07-20: 1 [IU] via SUBCUTANEOUS
  Administered 2024-07-20: 2 [IU] via SUBCUTANEOUS

## 2024-07-20 MED ORDER — INSULIN ASPART 100 UNIT/ML IJ SOLN
0.0000 [IU] | Freq: Three times a day (TID) | INTRAMUSCULAR | Status: DC
  Administered 2024-07-20: 7 [IU] via SUBCUTANEOUS
  Administered 2024-07-20: 5 [IU] via SUBCUTANEOUS
  Administered 2024-07-21: 2 [IU] via SUBCUTANEOUS
  Administered 2024-07-21: 3 [IU] via SUBCUTANEOUS

## 2024-07-20 MED ORDER — HYDROMORPHONE HCL 1 MG/ML IJ SOLN
0.5000 mg | INTRAMUSCULAR | Status: DC | PRN
  Administered 2024-07-21 – 2024-07-25 (×8): 0.5 mg via INTRAVENOUS
  Filled 2024-07-20 (×8): qty 0.5

## 2024-07-20 MED ORDER — ACETAMINOPHEN 325 MG PO TABS
650.0000 mg | ORAL_TABLET | Freq: Four times a day (QID) | ORAL | Status: DC | PRN
  Administered 2024-07-20 – 2024-07-27 (×7): 650 mg via ORAL
  Filled 2024-07-20 (×7): qty 2

## 2024-07-20 MED ORDER — INSULIN ASPART 100 UNIT/ML IJ SOLN
3.0000 [IU] | Freq: Three times a day (TID) | INTRAMUSCULAR | Status: DC
  Administered 2024-07-20 – 2024-07-21 (×3): 3 [IU] via SUBCUTANEOUS

## 2024-07-20 MED ORDER — INFLUENZA VIRUS VACC SPLIT PF (FLUZONE) 0.5 ML IM SUSY: 0.5000 mL | PREFILLED_SYRINGE | INTRAMUSCULAR | Status: DC

## 2024-07-20 MED ORDER — OXYCODONE HCL 5 MG PO TABS
5.0000 mg | ORAL_TABLET | ORAL | Status: DC | PRN
  Administered 2024-07-20 – 2024-07-25 (×12): 5 mg via ORAL
  Filled 2024-07-20 (×13): qty 1

## 2024-07-20 MED ORDER — INSULIN GLARGINE-YFGN 100 UNIT/ML ~~LOC~~ SOLN
10.0000 [IU] | Freq: Every day | SUBCUTANEOUS | Status: DC
  Filled 2024-07-20: qty 0.1

## 2024-07-20 MED ORDER — PROCHLORPERAZINE EDISYLATE 10 MG/2ML IJ SOLN
5.0000 mg | INTRAMUSCULAR | Status: DC | PRN
  Administered 2024-07-20 – 2024-07-21 (×4): 5 mg via INTRAVENOUS
  Filled 2024-07-20 (×5): qty 2

## 2024-07-20 MED ORDER — INSULIN GLARGINE-YFGN 100 UNIT/ML ~~LOC~~ SOLN
22.0000 [IU] | Freq: Every day | SUBCUTANEOUS | Status: DC
  Administered 2024-07-20: 22 [IU] via SUBCUTANEOUS
  Filled 2024-07-20 (×2): qty 0.22

## 2024-07-20 MED ORDER — ENOXAPARIN SODIUM 40 MG/0.4ML IJ SOSY
40.0000 mg | PREFILLED_SYRINGE | INTRAMUSCULAR | Status: DC
  Administered 2024-07-20 – 2024-07-22 (×3): 40 mg via SUBCUTANEOUS
  Filled 2024-07-20 (×3): qty 0.4

## 2024-07-20 MED ORDER — POTASSIUM CHLORIDE CRYS ER 20 MEQ PO TBCR
20.0000 meq | EXTENDED_RELEASE_TABLET | Freq: Once | ORAL | Status: AC
  Administered 2024-07-20: 20 meq via ORAL
  Filled 2024-07-20: qty 1

## 2024-07-20 MED ORDER — PIPERACILLIN-TAZOBACTAM 3.375 G IVPB 30 MIN
3.3750 g | Freq: Once | INTRAVENOUS | Status: AC
  Administered 2024-07-20: 3.375 g via INTRAVENOUS
  Filled 2024-07-20: qty 50

## 2024-07-20 MED ORDER — NIFEDIPINE ER OSMOTIC RELEASE 30 MG PO TB24
30.0000 mg | ORAL_TABLET | Freq: Every day | ORAL | Status: DC
  Administered 2024-07-20 – 2024-07-27 (×8): 30 mg via ORAL
  Filled 2024-07-20 (×8): qty 1

## 2024-07-20 MED ORDER — LINEZOLID 600 MG/300ML IV SOLN
600.0000 mg | Freq: Once | INTRAVENOUS | Status: AC
  Administered 2024-07-20: 600 mg via INTRAVENOUS
  Filled 2024-07-20 (×2): qty 300

## 2024-07-20 MED ORDER — PIPERACILLIN-TAZOBACTAM 3.375 G IVPB
3.3750 g | Freq: Three times a day (TID) | INTRAVENOUS | Status: DC
  Administered 2024-07-20 – 2024-07-21 (×3): 3.375 g via INTRAVENOUS
  Filled 2024-07-20 (×3): qty 50

## 2024-07-20 NOTE — ED Notes (Signed)
 Pt placed on bedpan by this NT, tolerated well

## 2024-07-20 NOTE — Consult Note (Signed)
 ORTHOPAEDIC CONSULTATION  REQUESTING PHYSICIAN: Kathrin Mignon DASEN, MD  Chief Complaint: Dehiscence right transtibial amputation.  HPI: [name] is a 38 y.o. female who presents with cellulitis and purulent drainage right transtibial amputation.  Patient is status post below-knee amputation at [facility] in January 2023 and status post revision in May at [facility] May 2023.  Patient states she has had several months of ulceration and drainage.  Past Medical History:  Diagnosis Date   Chronic hypertension    12-16-2020  documented in epic since 2017 pt dx hypertension (not during pregnancy);  pt currently is not and has not been taking any bp med,  bp at [facility] 12-15-2020 165/ 104   Missed ab 12/15/2020   Type 2 diabetes mellitus (HCC)    12-16-2020  pt stated only during pregnancy took medication, at [facility] 12-15-2020 blood glucose 353 pt was given metformin  bid and pt stated did start taking it   Wears glasses    Past Surgical History:  Procedure Laterality Date   BELOW KNEE LEG AMPUTATION Right    CESAREAN SECTION N/A 12/25/2018   Procedure: CESAREAN SECTION;  Surgeon: Barbra Lang PARAS, DO;  Location: [facility] LD ORS;  Service: Obstetrics;  Laterality: N/A;   TONSILLECTOMY AND ADENOIDECTOMY  age 47   Social History   Socioeconomic History   Marital status: Single    Spouse name: Not on file   Number of children: Not on file   Years of education: Not on file   Highest education level: Not on file  Occupational History   Not on file  Tobacco Use   Smoking status: Never   Smokeless tobacco: Never  Vaping Use   Vaping status: Never Used  Substance and Sexual Activity   Alcohol use: No   Drug use: Never   Sexual activity: Yes    Birth control/protection: None  Other Topics Concern   Not on file  Social History Narrative   Not on file   Social Drivers of Health   Financial Resource Strain: Low Risk  (03/16/2023)   Overall Financial Resource Strain (CARDIA)    Difficulty of Paying  Living Expenses: Not hard at all  Food Insecurity: No Food Insecurity (03/15/2023)   Hunger Vital Sign    Worried About Running Out of Food in the Last Year: Never true    Ran Out of Food in the Last Year: Never true  Transportation Needs: No Transportation Needs (03/15/2023)   PRAPARE - Administrator, Civil Service (Medical): No    Lack of Transportation (Non-Medical): No  Physical Activity: Insufficiently Active (03/16/2023)   Exercise Vital Sign    Days of Exercise per Week: 3 days    Minutes of Exercise per Session: 10 min  Stress: No Stress Concern Present (03/16/2023)   [employer] of Occupational Health - Occupational Stress Questionnaire    Feeling of Stress : Only a little  Social Connections: Moderately Integrated (03/13/2023)   Social Connection and Isolation Panel    Frequency of Communication with Friends and Family: More than three times a week    Frequency of Social Gatherings with Friends and Family: Three times a week    Attends Religious Services: More than 4 times per year    Active Member of Clubs or Organizations: Yes    Attends Banker Meetings: 1 to 4 times per year    Marital Status: Never married  Recent Concern: Social Connections - Moderately Isolated (03/13/2023)   Social Connection and  Isolation Panel    Frequency of Communication with Friends and Family: More than three times a week    Frequency of Social Gatherings with Friends and Family: Three times a week    Attends Religious Services: More than 4 times per year    Active Member of Clubs or Organizations: No    Attends Engineer, structural: Not on file    Marital Status: Never married   Family History  Problem Relation Age of Onset   Diabetes Mother    Hypertension Mother    Hypertension Maternal Grandmother    Diabetes Maternal Aunt    Diabetes Maternal Uncle    - negative except otherwise stated in the family history section Allergies  Allergen Reactions    Almond (Diagnostic) Hives   Almond Oil Hives   Bee Venom Hives   Limonene Hives and Nausea And Vomiting   Prior to Admission medications   Medication Sig Start Date End Date Taking? Authorizing Provider  acetaminophen  (TYLENOL ) 500 MG tablet Take 1,000 mg by mouth every 6 (six) hours as needed for mild pain (pain score 1-3), headache or fever.   Yes [provider]  azithromycin  (ZITHROMAX ) 250 MG tablet Take 250 mg by mouth as directed. 07/19/24  Yes [provider]  insulin  glargine (LANTUS  SOLOSTAR) 100 UNIT/ML Solostar Pen Inject 15 Units into the skin at bedtime. Patient is taking only 10 unit Patient taking differently: Inject 22 Units into the skin at bedtime. 03/13/23  Yes St Morton Hummer, Nena, NP  NIFEdipine  (ADALAT  CC) 30 MG 24 hr tablet Take 1 tablet (30 mg total) by mouth daily. 03/13/23  Yes St Morton Hummer, Nena, NP  NOVOLOG  FLEXPEN 100 UNIT/ML FlexPen Inject 5 Units into the skin 3 (three) times daily with meals. Also can use sliding scale if needed. Patient is taking 3 U at dinner and 5 unit Breakfast and lunch Patient taking differently: Inject 3-5 Units into the skin 3 (three) times daily with meals. Also can use sliding scale if needed. Patient is taking 3 U at dinner and 5 unit Breakfast and lunch 03/13/23  Yes St Morton Hummer, Nena, NP  ondansetron  (ZOFRAN -ODT) 4 MG disintegrating tablet Take 4 mg by mouth every 6 (six) hours as needed. 07/19/24  Yes [provider]   DG Knee Complete 4 Views Right Result Date: 07/19/2024 EXAM: 4 OR MORE VIEW(S) XRAY OF THE RIGHT KNEE 07/19/2024 08:57:07 PM COMPARISON: None available. CLINICAL HISTORY: infected stump ?. from UC with c/o right leg wound that has been ongoing for a month , pt states that the leg started swelling and has a fever today possible infection/ FINDINGS: BONES AND JOINTS: Status post below the knee amputation. Cortical irregularity and callus formation of the distal tibia,  suspicious for osteomyelitis. No joint dislocation. No significant joint effusion. No significant degenerative changes. SOFT TISSUES: Soft tissue swelling, soft tissue gas in the distal stump, which may reflect soft tissue infection with gas-forming organism. IMPRESSION: 1. Soft tissue infection with gas-forming organism in the BKA stump and osteomyelitis of the distal tibia. Electronically signed by: Norman Gatlin MD 07/19/2024 09:12 PM EDT RP Workstation: [id number]   - pertinent xrays, CT, MRI studies were reviewed and independently interpreted  Positive ROS: All other systems have been reviewed and were otherwise negative with the exception of those mentioned in the HPI and as above.  Physical Exam: General: Alert, no acute distress Psychiatric: Patient is competent for consent with normal mood and affect Lymphatic: No axillary or  cervical lymphadenopathy Cardiovascular: No pedal edema Respiratory: No cyanosis, no use of accessory musculature GI: No organomegaly, abdomen is soft and non-tender    Images:  @ENCIMAGES @  Labs:  Lab Results  Component Value Date   HGBA1C 11.9 (H) 03/16/2023   HGBA1C 10.3 (H) 03/13/2023   HGBA1C 6.9 (H) 08/08/2022   REPTSTATUS PENDING 07/19/2024   REPTSTATUS PENDING 07/19/2024   CULT  07/19/2024    NO GROWTH < 12 HOURS Performed at [facility], [address], [place], [place] [address]    CULT  07/19/2024    NO GROWTH < 12 HOURS Performed at [facility], [address] [place], [place] [address]     Lab Results  Component Value Date   ALBUMIN <1.5 (L) 07/20/2024   ALBUMIN 1.6 (L) 07/19/2024   ALBUMIN <1.5 (L) 03/16/2023        Latest Ref Rng & Units 07/20/2024    6:50 AM 07/19/2024    8:22 PM 03/18/2023    4:45 AM  CBC EXTENDED  WBC 4.0 - 10.5 K/uL 13.5  16.0  17.7   RBC 3.87 - 5.11 MIL/uL 3.23  3.64  3.71   Hemoglobin 12.0 - 15.0 g/dL 9.3  89.5  89.5   HCT 63.9 - 46.0 % 28.3  31.9  31.6   Platelets 150 - 400  K/uL 419  469  537     Neurologic: Patient does not have protective sensation bilateral lower extremities.   MUSCULOSKELETAL:   Skin: Examination there is a ulcer with purulent drainage from the right transtibial amputation.  Review of the MRI scan shows a large abscess and osteomyelitis of the distal tibia.  Radiograph shows air in the soft tissue.  Hemoglobin 9.3 with a white cell count of 13.5.  Albumin less than 1.5.  Hemoglobin A1c 11.9.  Assessment: Assessment uncontrolled type 2 diabetes with abscess and osteomyelitis right transtibial amputation.  Plan: Plan: Will plan for surgical intervention on Wednesday if time is available from the OR.  Continue IV antibiotics.  Thank you for the consult and the opportunity to see Ms. Crigler  Jerona Sage, MD [facility] [phone] 10:10 AM

## 2024-07-20 NOTE — ED Provider Notes (Signed)
 [place] EMERGENCY DEPARTMENT AT [facility] Provider Note   CSN: [id number] Arrival date & time: 07/19/24  1933     Patient presents with: Leg Swelling   [name] is a 38 y.o. female past medical history significant for diabetes and hypertension presents today for right leg wound from urgent care.  Patient reports that this has been going on for approximately a month.  Patient reports leg swelling and fever which began earlier today.  Patient currently reporting nausea and mild headache.  Patient denies stump pain.   HPI     Prior to Admission medications   Medication Sig Start Date End Date Taking? Authorizing Provider  acetaminophen  (TYLENOL ) 500 MG tablet Take 1,000 mg by mouth every 6 (six) hours as needed for mild pain (pain score 1-3), headache or fever.   Yes [provider]  azithromycin  (ZITHROMAX ) 250 MG tablet Take 250 mg by mouth as directed. 07/19/24  Yes [provider]  insulin  glargine (LANTUS  SOLOSTAR) 100 UNIT/ML Solostar Pen Inject 15 Units into the skin at bedtime. Patient is taking only 10 unit Patient taking differently: Inject 22 Units into the skin at bedtime. 03/13/23  Yes St Morton Hummer, Nena, NP  NIFEdipine  (ADALAT  CC) 30 MG 24 hr tablet Take 1 tablet (30 mg total) by mouth daily. 03/13/23  Yes St Morton Hummer, Nena, NP  NOVOLOG  FLEXPEN 100 UNIT/ML FlexPen Inject 5 Units into the skin 3 (three) times daily with meals. Also can use sliding scale if needed. Patient is taking 3 U at dinner and 5 unit Breakfast and lunch Patient taking differently: Inject 3-5 Units into the skin 3 (three) times daily with meals. Also can use sliding scale if needed. Patient is taking 3 U at dinner and 5 unit Breakfast and lunch 03/13/23  Yes St Morton Hummer, Nena, NP  ondansetron  (ZOFRAN -ODT) 4 MG disintegrating tablet Take 4 mg by mouth every 6 (six) hours as needed. 07/19/24  Yes [provider]    Allergies: Almond  (diagnostic), Almond oil, Bee venom, and Limonene    Review of Systems  Cardiovascular:  Positive for leg swelling.  Skin:  Positive for color change.    Updated Vital Signs BP 139/87 (BP Location: Right Arm)   Pulse (!) 108   Temp 99.3 F (37.4 C)   Resp 19   SpO2 97%   Physical Exam Vitals and nursing note reviewed.  Constitutional:      General: She is not in acute distress.    Appearance: Normal appearance. She is well-developed. She is not toxic-appearing.  HENT:     Head: Normocephalic and atraumatic.     Right Ear: External ear normal.     Left Ear: External ear normal.     Nose: Nose normal.     Mouth/Throat:     Mouth: Mucous membranes are moist.     Pharynx: Oropharynx is clear.  Eyes:     Extraocular Movements: Extraocular movements intact.     Conjunctiva/sclera: Conjunctivae normal.  Cardiovascular:     Rate and Rhythm: Regular rhythm. Tachycardia present.     Pulses: Normal pulses.     Heart sounds: Normal heart sounds.  Pulmonary:     Effort: Pulmonary effort is normal. No respiratory distress.     Breath sounds: Normal breath sounds.  Abdominal:     Palpations: Abdomen is soft.     Tenderness: There is no abdominal tenderness.  Musculoskeletal:        General: Swelling present.  Cervical back: Neck supple.  Skin:    General: Skin is warm and dry.     Capillary Refill: Capillary refill takes less than 2 seconds.     Findings: Erythema present.     Comments: Patient with mild swelling, erythema, warmth, and ulcerated area of scant purulent, malodorous drainage to distal right BKA stump.  Neurological:     General: No focal deficit present.     Mental Status: She is alert and oriented to person, place, and time.  Psychiatric:        Mood and Affect: Mood normal.     (all labs ordered are listed, but only abnormal results are displayed) Labs Reviewed  CBC - Abnormal; Notable for the following components:      Result Value   WBC 16.0 (*)     RBC 3.64 (*)    Hemoglobin 10.4 (*)    HCT 31.9 (*)    Platelets 469 (*)    All other components within normal limits  COMPREHENSIVE METABOLIC PANEL WITH GFR - Abnormal; Notable for the following components:   Sodium 131 (*)    Potassium 3.4 (*)    Chloride 95 (*)    Glucose, Bld 154 (*)    Creatinine, Ser 1.47 (*)    Calcium  8.3 (*)    Albumin 1.6 (*)    AST 90 (*)    ALT 62 (*)    Alkaline Phosphatase 157 (*)    GFR, Estimated 47 (*)    All other components within normal limits  CULTURE, BLOOD (ROUTINE X 2)  CULTURE, BLOOD (ROUTINE X 2)  I-STAT CG4 LACTIC ACID, ED    EKG: None  Radiology: DG Knee Complete 4 Views Right Result Date: 07/19/2024 EXAM: 4 OR MORE VIEW(S) XRAY OF THE RIGHT KNEE 07/19/2024 08:57:07 PM COMPARISON: None available. CLINICAL HISTORY: infected stump ?. from UC with c/o right leg wound that has been ongoing for a month , pt states that the leg started swelling and has a fever today possible infection/ FINDINGS: BONES AND JOINTS: Status post below the knee amputation. Cortical irregularity and callus formation of the distal tibia, suspicious for osteomyelitis. No joint dislocation. No significant joint effusion. No significant degenerative changes. SOFT TISSUES: Soft tissue swelling, soft tissue gas in the distal stump, which may reflect soft tissue infection with gas-forming organism. IMPRESSION: 1. Soft tissue infection with gas-forming organism in the BKA stump and osteomyelitis of the distal tibia. Electronically signed by: Norman Gatlin MD 07/19/2024 09:12 PM EDT RP Workstation: [id number]     Procedures   Medications Ordered in the ED  piperacillin-tazobactam (ZOSYN) IVPB 3.375 g (has no administration in time range)  linezolid (ZYVOX) IVPB 600 mg (has no administration in time range)  acetaminophen  (TYLENOL ) tablet 650 mg (has no administration in time range)  ondansetron  (ZOFRAN -ODT) disintegrating tablet 4 mg (has no administration in time range)   sodium chloride  0.9 % bolus 1,000 mL (has no administration in time range)                                    Medical Decision Making Amount and/or Complexity of Data Reviewed Labs: ordered. Radiology: ordered.   This patient presents to the ED for concern of right lower extremity wound differential diagnosis includes cellulitis, abscess, osteomyelitis, necrotizing fasciitis, dermatitis    Additional history obtained   Additional history obtained from Electronic Medical Record External records from outside source  obtained and reviewed including previous admissions and surgeries   Lab Tests:  I Ordered, and personally interpreted labs.  The pertinent results include: Leukocytosis at 16, decreased hemoglobin at 10.4 which is around patient's baseline, lactic acid 0.9, mild hyponatremia 131, mild hypokalemia 3.4, elevated creatinine at 1.47 from baseline of approximately 0.9, elevated alk phos at 157, elevated AST at 90, elevated ALT at 62,   Imaging Studies ordered:  I ordered imaging studies including right knee x-ray I independently visualized and interpreted imaging which showed soft tissue infection with gas-forming organism in the BKA stump and osteomyelitis of the distal tibia I agree with the radiologist interpretation   Medicines ordered and prescription drug management:  I ordered medication including Zosyn, linezolid, IVF, Tylenol  and Zofran     I have reviewed the patients home medicines and have made adjustments as needed   Problem List / ED Course:  Consulted orthopedic surgery, Dr. Barton who requested MRI of patient's RLE and hospitalist admission.  Patient to be kept n.p.o. for likely surgery in the morning. Consulted hospitalist, Dr. Charlton who is agreeable to admission     Final diagnoses:  Osteomyelitis of right tibia, unspecified type [facility])  AKI (acute kidney injury)    ED Discharge Orders     None          Francis Ileana SAILOR,  PA-C 07/20/24 [id number]    Trine Raynell Moder, MD 07/20/24 [phone]

## 2024-07-20 NOTE — Consult Note (Signed)
 WOC Nurse Consult Note: Reason for Consult: R BKA stump wound Wound type: non healing stump wound; BKA at [facility] 2023; not sure if she has been wearing prosthetic  MRI pending Pressure Injury POA:NA Measurement: see nursing flow sheets Wound bed: pale, non healing  Drainage (amount, consistency, odor) see nursing flow sheets Periwound: intact  Dressing procedure/placement/frequency: Cleanse right stump wound with Vashe Soila [phone]), pat dry. Apply Vashe moist gauze (not soaked), top with dry dressing, wrap with kerlix. Change daily   Re consult if needed, will not follow at this time. Thanks  Ivannah Zody [employer], RN,CWOCN, CNS, [employer] [phone]

## 2024-07-20 NOTE — ED Notes (Signed)
 Patient transported to MRI

## 2024-07-20 NOTE — Care Plan (Signed)
 Orthopaedic Surgery Plan of Care Note   -history and imaging reviewed with requesting team (ER) -pt has right BKA stump site infection with draining wound x 1 month. No systemic signs or symptoms but worse over past week -PMH includes insulin -dependent diabetes type 2, hypertension, morbid obesity, chronic hyponatremia  -admit to Hospitalist team -please keep NPO and hold VTE ppx for OR today -will plan on right revision BKA vs AKA today pending OR -full consult note to follow   Lillia Mountain, MD Orthopaedic Surgery [facility]

## 2024-07-20 NOTE — H&P (View-Only) (Signed)
 ORTHOPAEDIC CONSULTATION  REQUESTING PHYSICIAN: Kathrin Mignon DASEN, MD  Chief Complaint: Dehiscence right transtibial amputation.  HPI: [name] is a 38 y.o. female who presents with cellulitis and purulent drainage right transtibial amputation.  Patient is status post below-knee amputation at [facility] in January 2023 and status post revision in May at [facility] May 2023.  Patient states she has had several months of ulceration and drainage.  Past Medical History:  Diagnosis Date   Chronic hypertension    12-16-2020  documented in epic since 2017 pt dx hypertension (not during pregnancy);  pt currently is not and has not been taking any bp med,  bp at [facility] 12-15-2020 165/ 104   Missed ab 12/15/2020   Type 2 diabetes mellitus (HCC)    12-16-2020  pt stated only during pregnancy took medication, at [facility] 12-15-2020 blood glucose 353 pt was given metformin  bid and pt stated did start taking it   Wears glasses    Past Surgical History:  Procedure Laterality Date   BELOW KNEE LEG AMPUTATION Right    CESAREAN SECTION N/A 12/25/2018   Procedure: CESAREAN SECTION;  Surgeon: Barbra Lang PARAS, DO;  Location: [facility] LD [facility]S;  Service: Obstetrics;  Laterality: N/A;   TONSILLECTOMY AND ADENOIDECTOMY  age 47   Social History   Socioeconomic History   Marital status: Single    Spouse name: Not on file   Number of children: Not on file   Years of education: Not on file   Highest education level: Not on file  Occupational History   Not on file  Tobacco Use   Smoking status: Never   Smokeless tobacco: Never  Vaping Use   Vaping status: Never Used  Substance and Sexual Activity   Alcohol use: No   Drug use: Never   Sexual activity: Yes    Birth control/protection: None  Other Topics Concern   Not on file  Social History Narrative   Not on file   Social Drivers of Health   Financial Resource Strain: Low Risk  (03/16/2023)   Overall Financial Resource Strain (CARDIA)    Difficulty of Paying  Living Expenses: Not hard at all  Food Insecurity: No Food Insecurity (03/15/2023)   Hunger Vital Sign    Worried About Running Out of Food in the Last Year: Never true    Ran Out of Food in the Last Year: Never true  Transportation Needs: No Transportation Needs (03/15/2023)   PRAPARE - Administrator, Civil Service (Medical): No    Lack of Transportation (Non-Medical): No  Physical Activity: Insufficiently Active (03/16/2023)   Exercise Vital Sign    Days of Exercise per Week: 3 days    Minutes of Exercise per Session: 10 min  Stress: No Stress Concern Present (03/16/2023)   [employer] of Occupational Health - Occupational Stress Questionnaire    Feeling of Stress : Only a little  Social Connections: Moderately Integrated (03/13/2023)   Social Connection and Isolation Panel    Frequency of Communication with Friends and Family: More than three times a week    Frequency of Social Gatherings with Friends and Family: Three times a week    Attends Religious Services: More than 4 times per year    Active Member of Clubs or Organizations: Yes    Attends Banker Meetings: 1 to 4 times per year    Marital Status: Never married  Recent Concern: Social Connections - Moderately Isolated (03/13/2023)   Social Connection and  Isolation Panel    Frequency of Communication with Friends and Family: More than three times a week    Frequency of Social Gatherings with Friends and Family: Three times a week    Attends Religious Services: More than 4 times per year    Active Member of Clubs or Organizations: No    Attends Engineer, structural: Not on file    Marital Status: Never married   Family History  Problem Relation Age of Onset   Diabetes Mother    Hypertension Mother    Hypertension Maternal Grandmother    Diabetes Maternal Aunt    Diabetes Maternal Uncle    - negative except otherwise stated in the family history section Allergies  Allergen Reactions    Almond (Diagnostic) Hives   Almond Oil Hives   Bee Venom Hives   Limonene Hives and Nausea And Vomiting   Prior to Admission medications   Medication Sig Start Date End Date Taking? Authorizing Provider  acetaminophen  (TYLENOL ) 500 MG tablet Take 1,000 mg by mouth every 6 (six) hours as needed for mild pain (pain score 1-3), headache or fever.   Yes [provider]  azithromycin  (ZITHROMAX ) 250 MG tablet Take 250 mg by mouth as directed. 07/19/24  Yes [provider]  insulin  glargine (LANTUS  SOLOSTAR) 100 UNIT/ML Solostar Pen Inject 15 Units into the skin at bedtime. Patient is taking only 10 unit Patient taking differently: Inject 22 Units into the skin at bedtime. 03/13/23  Yes St Morton Hummer, Nena, NP  NIFEdipine  (ADALAT  CC) 30 MG 24 hr tablet Take 1 tablet (30 mg total) by mouth daily. 03/13/23  Yes St Morton Hummer, Nena, NP  NOVOLOG  FLEXPEN 100 UNIT/ML FlexPen Inject 5 Units into the skin 3 (three) times daily with meals. Also can use sliding scale if needed. Patient is taking 3 U at dinner and 5 unit Breakfast and lunch Patient taking differently: Inject 3-5 Units into the skin 3 (three) times daily with meals. Also can use sliding scale if needed. Patient is taking 3 U at dinner and 5 unit Breakfast and lunch 03/13/23  Yes St Morton Hummer, Nena, NP  ondansetron  (ZOFRAN -ODT) 4 MG disintegrating tablet Take 4 mg by mouth every 6 (six) hours as needed. 07/19/24  Yes [provider]   DG Knee Complete 4 Views Right Result Date: 07/19/2024 EXAM: 4 OR MORE VIEW(S) XRAY OF THE RIGHT KNEE 07/19/2024 08:57:07 PM COMPARISON: None available. CLINICAL HISTORY: infected stump ?. from UC with c/o right leg wound that has been ongoing for a month , pt states that the leg started swelling and has a fever today possible infection/ FINDINGS: BONES AND JOINTS: Status post below the knee amputation. Cortical irregularity and callus formation of the distal tibia,  suspicious for osteomyelitis. No joint dislocation. No significant joint effusion. No significant degenerative changes. SOFT TISSUES: Soft tissue swelling, soft tissue gas in the distal stump, which may reflect soft tissue infection with gas-forming organism. IMPRESSION: 1. Soft tissue infection with gas-forming organism in the BKA stump and osteomyelitis of the distal tibia. Electronically signed by: Norman Gatlin MD 07/19/2024 09:12 PM EDT RP Workstation: [id number]   - pertinent xrays, CT, MRI studies were reviewed and independently interpreted  Positive ROS: All other systems have been reviewed and were otherwise negative with the exception of those mentioned in the HPI and as above.  Physical Exam: General: Alert, no acute distress Psychiatric: Patient is competent for consent with normal mood and affect Lymphatic: No axillary or  cervical lymphadenopathy Cardiovascular: No pedal edema Respiratory: No cyanosis, no use of accessory musculature GI: No organomegaly, abdomen is soft and non-tender    Images:  @ENCIMAGES @  Labs:  Lab Results  Component Value Date   HGBA1C 11.9 (H) 03/16/2023   HGBA1C 10.3 (H) 03/13/2023   HGBA1C 6.9 (H) 08/08/2022   REPTSTATUS PENDING 07/19/2024   REPTSTATUS PENDING 07/19/2024   CULT  07/19/2024    NO GROWTH < 12 HOURS Performed at [facility], [address], [place], [place] [address]    CULT  07/19/2024    NO GROWTH < 12 HOURS Performed at [facility], [address] [place], [place] [address]     Lab Results  Component Value Date   ALBUMIN <1.5 (L) 07/20/2024   ALBUMIN 1.6 (L) 07/19/2024   ALBUMIN <1.5 (L) 03/16/2023        Latest Ref Rng & Units 07/20/2024    6:50 AM 07/19/2024    8:22 PM 03/18/2023    4:45 AM  CBC EXTENDED  WBC 4.0 - 10.5 K/uL 13.5  16.0  17.7   RBC 3.87 - 5.11 MIL/uL 3.23  3.64  3.71   Hemoglobin 12.0 - 15.0 g/dL 9.3  89.5  89.5   HCT 63.9 - 46.0 % 28.3  31.9  31.6   Platelets 150 - 400  K/uL 419  469  537     Neurologic: Patient does not have protective sensation bilateral lower extremities.   MUSCULOSKELETAL:   Skin: Examination there is a ulcer with purulent drainage from the right transtibial amputation.  Review of the MRI scan shows a large abscess and osteomyelitis of the distal tibia.  Radiograph shows air in the soft tissue.  Hemoglobin 9.3 with a white cell count of 13.5.  Albumin less than 1.5.  Hemoglobin A1c 11.9.  Assessment: Assessment uncontrolled type 2 diabetes with abscess and osteomyelitis right transtibial amputation.  Plan: Plan: Will plan for surgical intervention on Wednesday if time is available from the OR.  Continue IV antibiotics.  Thank you for the consult and the opportunity to see Ms. Crigler  Jerona Sage, MD [facility] [phone] 10:10 AM

## 2024-07-20 NOTE — H&P (Signed)
 History and Physical    Patient: [name] FMW:[id number] DOB: Dec 01, 1985 DOA: 07/19/2024 DOS: the patient was seen and examined on 07/20/2024 PCP: [name] [name], NP  Patient coming from: Home.  Uses prosthesis. Also has WC  Chief Complaint:  Chief Complaint  Patient presents with   Leg Swelling   HPI: [name] is a 38 y.o. female with PMH of IDDM-2, HTN and Rt BKA wound presenting with increasing right BKA swelling and purulent drainage.   Patient has had right BKA wound going on for about a month.  Presented to urgent care due to increased swelling and some drainage, and sent to ED for further evaluation.  No report of fever, pain and chills before coming to the hospital.  She uses prosthesis.  She has had some nausea and vomiting for 3 to 4 days.  Denies abdominal pain or constipation.  Denies UTI symptoms.  Denies runny nose, sore throat, chest pain or trouble breathing.  Patient denies smoking cigarette, drinking alcohol recreational drug use.  He is interested in cardiopulmonary cessation in event of sudden cardiopulmonary arrest.  In ED, febrile to 100.4.  Tachycardic to 117.  Other vital stable.  Na 131>>> 134.  K3.4.  Glucose 154.  Cr 1.47>> 1.49 (0.86 in 03/2023). BUN 20.  AST 90>> 31.  ALT 62 >> 45.  WBC 16>> 13.5.  Hgb 10.4 (baseline)>> 9.3.  Lactic acid negative.  X-ray concerning for osteo/gas.  Blood culture and MRI ordered.  Started on broad-spectrum antibiotics.  Orthopedic surgery consulted.  Admission accepted by my colleague from overnight and completed by me   Review of Systems: As mentioned in the history of present illness. All other systems reviewed and are negative. Past Medical History:  Diagnosis Date   Chronic hypertension    12-16-2020  documented in epic since 2017 pt dx hypertension (not during pregnancy);  pt currently is not and has not been taking any bp med,  bp at [facility] 12-15-2020 165/ 104   Missed ab 12/15/2020   Type 2 diabetes  mellitus (HCC)    12-16-2020  pt stated only during pregnancy took medication, at [facility] 12-15-2020 blood glucose 353 pt was given metformin  bid and pt stated did start taking it   Wears glasses    Past Surgical History:  Procedure Laterality Date   BELOW KNEE LEG AMPUTATION Right    CESAREAN SECTION N/A 12/25/2018   Procedure: CESAREAN SECTION;  Surgeon: Barbra Lang PARAS, DO;  Location: [facility] LD ORS;  Service: Obstetrics;  Laterality: N/A;   TONSILLECTOMY AND ADENOIDECTOMY  age 61   Social History:  reports that she has never smoked. She has never used smokeless tobacco. She reports that she does not drink alcohol and does not use drugs.  Allergies  Allergen Reactions   Almond (Diagnostic) Hives   Almond Oil Hives   Bee Venom Hives   Limonene Hives and Nausea And Vomiting    Family History  Problem Relation Age of Onset   Diabetes Mother    Hypertension Mother    Hypertension Maternal Grandmother    Diabetes Maternal Aunt    Diabetes Maternal Uncle     Prior to Admission medications   Medication Sig Start Date End Date Taking? Authorizing Provider  acetaminophen  (TYLENOL ) 500 MG tablet Take 1,000 mg by mouth every 6 (six) hours as needed for mild pain (pain score 1-3), headache or fever.   Yes [provider]  azithromycin  (ZITHROMAX ) 250 MG tablet Take 250 mg by mouth  as directed. 07/19/24  Yes [provider]  insulin  glargine (LANTUS  SOLOSTAR) 100 UNIT/ML Solostar Pen Inject 15 Units into the skin at bedtime. Patient is taking only 10 unit Patient taking differently: Inject 22 Units into the skin at bedtime. 03/13/23  Yes St Morton Hummer, Nena, NP  NIFEdipine  (ADALAT  CC) 30 MG 24 hr tablet Take 1 tablet (30 mg total) by mouth daily. 03/13/23  Yes St Morton Hummer, Nena, NP  NOVOLOG  FLEXPEN 100 UNIT/ML FlexPen Inject 5 Units into the skin 3 (three) times daily with meals. Also can use sliding scale if needed. Patient is taking 3 U at dinner and 5 unit Breakfast  and lunch Patient taking differently: Inject 3-5 Units into the skin 3 (three) times daily with meals. Also can use sliding scale if needed. Patient is taking 3 U at dinner and 5 unit Breakfast and lunch 03/13/23  Yes St Morton Hummer, Nena, NP  ondansetron  (ZOFRAN -ODT) 4 MG disintegrating tablet Take 4 mg by mouth every 6 (six) hours as needed. 07/19/24  Yes [provider]    Physical Exam: Vitals:   07/20/24 0119 07/20/24 0813 07/20/24 0814 07/20/24 1129  BP: 139/87 (!) 147/89  110/70  Pulse: (!) 108 93  87  Resp: 19 13  14   Temp: 99.3 F (37.4 C) 98 F (36.7 C)  97.6 F (36.4 C)  TempSrc:  Oral  Oral  SpO2: 97% 100%  100%  Weight:   97.1 kg   Height:   5' 7 (1.702 m)    GENERAL: No apparent distress.  Nontoxic. HEENT: MMM.  Vision and hearing grossly intact.  NECK: Supple.  No apparent JVD.  RESP:  No IWOB.  Fair aeration bilaterally. CVS:  RRR. Heart sounds normal.  ABD/GI/GU: BS+. Abd soft, NTND.  MSK/EXT:   Right BKA wound.  No apparent drainage. SKIN: no apparent skin lesion or wound NEURO: Awake and alert. Oriented appropriately.  No apparent focal neuro deficit. PSYCH: Calm. Normal affect.  Data Reviewed: See HPI  Assessment and Plan: Sepsis due to right BKA wound with concern for distal tibial osteomyelitis: has fever and leukocytosis on presentation.  Wound ongoing for about a month.  X-ray concerning for distal tibial osteomyelitis - Follow MRI - Continue Zosyn and Zyvox - Wound care, CRP and ESR - Plan for surgical intervention on 10/22  IDDM-2 with hyperglycemia: A1c 11.9 in 03/2023.  On Lantus  22 units at night and sliding scale insulin  at home Recent Labs  Lab 07/20/24 0419 07/20/24 0754  GLUCAP 179* 222*  -Continue home Lantus  of 23 units -SSI-sensitive -NovoLog  3 units 3 times daily with meals -Recheck hemoglobin A1c  Elevated creatinine: Suspect CKD versus AKI.  Cr 1.5.,  0.86 in 03/2023.  Not on nephrotoxic meds. Recent Labs     07/19/24 2022 07/20/24 0650  BUN 20 21*  CREATININE 1.47* 1.49*  - Continue monitoring  Hypokalemia -Monitor replenish K and Mg as appropriate  Pseudohyponatremia: Corrects to normal for hyperglycemia  Essential hypertension: Normotensive for most part -Continue home Procardia   Anemia of chronic disease: Stable. Recent Labs    07/19/24 2022 07/20/24 0650  HGB 10.4* 9.3*  - Check anemia panel - Continue monitor  Elevated liver enzymes: Pattern consistent with alcohol or rhabdo.  Resolving.  Denies alcohol. - Check CK     Advance Care Planning:   Code Status: Full Code-discussed with patient  Consults: Orthopedic surgery  Family Communication: None at the bedside  Severity of Illness: The appropriate patient status for  this patient is INPATIENT. Inpatient status is judged to be reasonable and necessary in order to provide the required intensity of service to ensure the patient's safety. The patient's presenting symptoms, physical exam findings, and initial radiographic and laboratory data in the context of their chronic comorbidities is felt to place them at high risk for further clinical deterioration. Furthermore, it is not anticipated that the patient will be medically stable for discharge from the hospital within 2 midnights of admission.   * I certify that at the point of admission it is my clinical judgment that the patient will require inpatient hospital care spanning beyond 2 midnights from the point of admission due to high intensity of service, high risk for further deterioration and high frequency of surveillance required.*  Author: Mignon ONEIDA Bump, MD 07/20/2024 11:41 AM  For on call review [email or web address].

## 2024-07-21 ENCOUNTER — Other Ambulatory Visit (HOSPITAL_COMMUNITY): Payer: Self-pay

## 2024-07-21 ENCOUNTER — Telehealth (HOSPITAL_COMMUNITY): Payer: Self-pay | Admitting: Pharmacy Technician

## 2024-07-21 ENCOUNTER — Inpatient Hospital Stay (HOSPITAL_COMMUNITY)

## 2024-07-21 DIAGNOSIS — N179 Acute kidney failure, unspecified: Secondary | ICD-10-CM

## 2024-07-21 DIAGNOSIS — M86461 Chronic osteomyelitis with draining sinus, right tibia and fibula: Secondary | ICD-10-CM | POA: Diagnosis not present

## 2024-07-21 DIAGNOSIS — T879 Unspecified complications of amputation stump: Secondary | ICD-10-CM | POA: Diagnosis not present

## 2024-07-21 LAB — GLUCOSE, CAPILLARY
Glucose-Capillary: 225 mg/dL — ABNORMAL HIGH (ref 70–99)
Glucose-Capillary: 187 mg/dL — ABNORMAL HIGH (ref 70–99)
Glucose-Capillary: 249 mg/dL — ABNORMAL HIGH (ref 70–99)
Glucose-Capillary: 188 mg/dL — ABNORMAL HIGH (ref 70–99)
Glucose-Capillary: 187 mg/dL — ABNORMAL HIGH (ref 70–99)

## 2024-07-21 LAB — URINALYSIS, ROUTINE W REFLEX MICROSCOPIC
Bilirubin Urine: NEGATIVE
Glucose, UA: >=500 mg/dL — AB
Hgb urine dipstick: NEGATIVE
Ketones, ur: NEGATIVE mg/dL
Leukocytes,Ua: NEGATIVE
Nitrite: NEGATIVE
Protein, ur: >=300 mg/dL — AB
Specific Gravity, Urine: 1.016 (ref 1.005–1.030)
pH: 5 (ref 5.0–8.0)

## 2024-07-21 LAB — CBC
HCT: 28.4 % — ABNORMAL LOW (ref 36.0–46.0)
Hemoglobin: 9.3 g/dL — ABNORMAL LOW (ref 12.0–15.0)
MCH: 28.7 pg (ref 26.0–34.0)
MCHC: 32.7 g/dL (ref 30.0–36.0)
MCV: 87.7 fL (ref 80.0–100.0)
Platelets: 447 K/uL — ABNORMAL HIGH (ref 150–400)
RBC: 3.24 MIL/uL — ABNORMAL LOW (ref 3.87–5.11)
RDW: 13 % (ref 11.5–15.5)
WBC: 13 K/uL — ABNORMAL HIGH (ref 4.0–10.5)
nRBC: 0 % (ref 0.0–0.2)

## 2024-07-21 LAB — COMPREHENSIVE METABOLIC PANEL WITH GFR
ALT: 34 U/L (ref 0–44)
AST: 32 U/L (ref 15–41)
Albumin: <1.5 g/dL — ABNORMAL LOW (ref 3.5–5.0)
Alkaline Phosphatase: 139 U/L — ABNORMAL HIGH (ref 38–126)
Anion gap: 10 (ref 5–15)
BUN: 28 mg/dL — ABNORMAL HIGH (ref 6–20)
CO2: 24 mmol/L (ref 22–32)
Calcium: 7.9 mg/dL — ABNORMAL LOW (ref 8.9–10.3)
Chloride: 97 mmol/L — ABNORMAL LOW (ref 98–111)
Creatinine, Ser: 2.25 mg/dL — ABNORMAL HIGH (ref 0.44–1.00)
GFR, Estimated: 28 mL/min — ABNORMAL LOW (ref >60)
Glucose, Bld: 219 mg/dL — ABNORMAL HIGH (ref 70–99)
Potassium: 4.1 mmol/L (ref 3.5–5.1)
Sodium: 131 mmol/L — ABNORMAL LOW (ref 135–145)
Total Bilirubin: 0.8 mg/dL (ref 0.0–1.2)
Total Protein: 6.9 g/dL (ref 6.5–8.1)

## 2024-07-21 LAB — C-REACTIVE PROTEIN: CRP: 23.1 mg/dL — ABNORMAL HIGH (ref <1.0)

## 2024-07-21 LAB — MAGNESIUM: Magnesium: 1.8 mg/dL (ref 1.7–2.4)

## 2024-07-21 LAB — SEDIMENTATION RATE: Sed Rate: >140 mm/h — ABNORMAL HIGH (ref 0–22)

## 2024-07-21 MED ORDER — METOCLOPRAMIDE HCL 5 MG/ML IJ SOLN
5.0000 mg | Freq: Four times a day (QID) | INTRAMUSCULAR | Status: DC | PRN
  Administered 2024-07-21 – 2024-07-22 (×2): 5 mg via INTRAVENOUS
  Filled 2024-07-21 (×2): qty 2

## 2024-07-21 MED ORDER — INSULIN ASPART 100 UNIT/ML IJ SOLN
0.0000 [IU] | Freq: Three times a day (TID) | INTRAMUSCULAR | Status: DC
  Administered 2024-07-21 – 2024-07-22 (×3): 3 [IU] via SUBCUTANEOUS
  Administered 2024-07-23: 11 [IU] via SUBCUTANEOUS
  Administered 2024-07-23: 3 [IU] via SUBCUTANEOUS
  Administered 2024-07-24: 8 [IU] via SUBCUTANEOUS
  Administered 2024-07-24: 5 [IU] via SUBCUTANEOUS
  Administered 2024-07-24: 8 [IU] via SUBCUTANEOUS
  Administered 2024-07-25: 3 [IU] via SUBCUTANEOUS
  Administered 2024-07-25 (×2): 2 [IU] via SUBCUTANEOUS
  Administered 2024-07-26: 3 [IU] via SUBCUTANEOUS
  Administered 2024-07-27: 2 [IU] via SUBCUTANEOUS
  Filled 2024-07-21: qty 1

## 2024-07-21 MED ORDER — LIVING WELL WITH DIABETES BOOK
Freq: Once | Status: AC
  Filled 2024-07-21: qty 1

## 2024-07-21 MED ORDER — SODIUM CHLORIDE 0.9 % IV SOLN: INTRAVENOUS | Status: AC

## 2024-07-21 MED ORDER — SODIUM CHLORIDE 0.9 % IV SOLN
2.0000 g | Freq: Two times a day (BID) | INTRAVENOUS | Status: DC
  Administered 2024-07-21 – 2024-07-23 (×5): 2 g via INTRAVENOUS
  Filled 2024-07-21 (×5): qty 12.5

## 2024-07-21 MED ORDER — METRONIDAZOLE 500 MG/100ML IV SOLN
500.0000 mg | Freq: Two times a day (BID) | INTRAVENOUS | Status: DC
  Administered 2024-07-21 – 2024-07-24 (×6): 500 mg via INTRAVENOUS
  Filled 2024-07-21 (×6): qty 100

## 2024-07-21 MED ORDER — INSULIN GLARGINE-YFGN 100 UNIT/ML ~~LOC~~ SOLN
25.0000 [IU] | Freq: Every day | SUBCUTANEOUS | Status: DC
  Administered 2024-07-21: 25 [IU] via SUBCUTANEOUS
  Filled 2024-07-21 (×2): qty 0.25

## 2024-07-21 MED ORDER — INSULIN ASPART 100 UNIT/ML IJ SOLN
5.0000 [IU] | Freq: Three times a day (TID) | INTRAMUSCULAR | Status: DC
  Administered 2024-07-21 – 2024-07-22 (×3): 5 [IU] via SUBCUTANEOUS

## 2024-07-21 MED ORDER — ONDANSETRON HCL 4 MG/2ML IJ SOLN
4.0000 mg | Freq: Four times a day (QID) | INTRAMUSCULAR | Status: DC | PRN
  Administered 2024-07-21 – 2024-07-22 (×3): 4 mg via INTRAVENOUS
  Filled 2024-07-21 (×3): qty 2

## 2024-07-21 NOTE — Plan of Care (Signed)
  Problem: Education: Goal: Ability to describe self-care measures that may prevent or decrease complications (Diabetes Survival Skills Education) will improve Outcome: Progressing   Problem: Coping: Goal: Ability to adjust to condition or change in health will improve Outcome: Progressing   Problem: Health Behavior/Discharge Planning: Goal: Ability to identify and utilize available resources and services will improve Outcome: Progressing

## 2024-07-21 NOTE — Progress Notes (Signed)
 PROGRESS NOTE  [name] FMW:[id number] DOB: 31-May-1986   PCP: [name] [name], NP  Patient is from: Home. Uses prosthesis. Also has WC   DOA: 07/19/2024 LOS: 1  Chief complaints Chief Complaint  Patient presents with   Leg Swelling     Brief Narrative / Interim history: 38 y.o. female with PMH of IDDM-2, HTN and Rt BKA wound presenting with increasing right BKA swelling and purulent drainage, and admitted with working diagnosis of sepsis due to right BKA cellulitis/osteomyelitis.   In ED, febrile to 100.4.  Tachycardic to 117.  Other vital stable.  Na 131>>> 134.  K3.4.  Glucose 154.  Cr 1.47>> 1.49 (0.86 in 03/2023). BUN 20.  AST 90>> 31.  ALT 62 >> 45.  WBC 16>> 13.5.  Hgb 10.4 (baseline)>> 9.3.  Lactic acid negative.  X-ray concerning for osteo/gas.  Blood culture and MRI ordered.  Started on broad-spectrum antibiotics.  Orthopedic surgery consulted.    MRI showed large complex fluid collection containing multiple air bubbles within soft tissue of the distal stump suspicious for abscess and underlying gas-forming infection, possible early distal tibial osteomyelitis, possible patellar bursitis.  Remains on broad-spectrum antibiotics.  Plan for surgical intervention on 10/22 by Dr. Harden   Subjective: Seen and examined earlier this morning.  No major events overnight or this morning.  Ongoing nausea but no emesis.  Denies abdominal pain.  Denies UTI symptoms.   Assessment and plan: Sepsis due to right BKA osteomyelitis and abscess: has fever and leukocytosis on presentation.  Wound ongoing for about a month.  X-ray concerning for distal tibial osteomyelitis.  MRI concerning for abscess and early cellulitis.  CRP 23.  ESR > 140. -Continue Zyvox.  Change Zosyn to cefepime and Flagyl  given AKI. - Plan for surgical intervention on 10/22 -Appreciate help by wound care   IDDM-2 with hyperglycemia: A1c 15.2% (was 11.9% in 03/2023).  On Lantus  22 units at night and sliding  scale insulin  at home.  Not compliant. Recent Labs  Lab 07/20/24 1949 07/20/24 2248 07/21/24 0505 07/21/24 0917 07/21/24 1148  GLUCAP 290* 242* 225* 187* 249*  -Increase SSI from sensitive to moderate -NovoLog  from 3 to 5 units 3 times daily with meals -Increase Semglee  from 22 to 25 units at bedtime -Consult diabetic coordinator  Nausea and vomiting: Gastroparesis?  Abdominal exam benign. -Change Compazine  to Reglan  6 hours as needed.  May schedule this if needed -IV Zofran  as needed for refractory nausea and vomiting -Check EKG for QTc -Glycemic control   AKI: Suspect CKD versus AKI.  Cr 1.5.,  0.86 in 03/2023.  Not on nephrotoxic meds.  Has ongoing nausea and vomiting.  UA with > 300 protein. Recent Labs    07/19/24 2022 07/20/24 0650 07/21/24 0640  BUN 20 21* 28*  CREATININE 1.47* 1.49* 2.25*  - IV NS at 100 cc an hour -Check renal US  and UPC -Check intake and output -Avoid nephrotoxic meds -Changed Zosyn to cefepime   Hypokalemia -Monitor replenish K and Mg as appropriate   Pseudohyponatremia: Corrects to normal for hyperglycemia   Essential hypertension: Normotensive for most part -Continue home Procardia    Anemia of chronic disease: Relatively stable.  Anemia panel consistent with anemia of chronic disease. Recent Labs    07/19/24 2022 07/20/24 0650 07/21/24 0640  HGB 10.4* 9.3* 9.3*  - Continue monitoring   Elevated liver enzymes: Pattern consistent with alcohol or rhabdo.  CK not elevated.  Denies alcohol.  Resolved.  Class I obesity Body mass  index is 33.52 kg/m.           DVT prophylaxis:  enoxaparin  (LOVENOX ) injection 40 mg Start: 07/20/24 1130  Code Status: Full code Family Communication: None at bedside Level of care: Telemetry Surgical Status is: Inpatient Remains inpatient appropriate because: AKI, osteomyelitis/abscess   Final disposition: Likely home once medically stable   55 minutes with more than 50% spent in reviewing  records, counseling patient/family and coordinating care.  Consultants:  Orthopedic surgery  Procedures: None  Microbiology summarized: Blood cultures NGTD  Objective: Vitals:   07/21/24 0441 07/21/24 0736 07/21/24 0743 07/21/24 1439  BP: 121/68 127/82 122/82 (!) 144/81  Pulse: (!) 106 91 92 (!) 109  Resp: 17  18   Temp: 98.5 F (36.9 C)  98.4 F (36.9 C) 99.6 F (37.6 C)  TempSrc:   Oral Oral  SpO2: 98% 99% 99% 98%  Weight:      Height:        Examination: GENERAL: No apparent distress.  Nontoxic. HEENT: MMM.  Vision and hearing grossly intact.  NECK: Supple.  No apparent JVD.  RESP:  No IWOB.  Fair aeration bilaterally. CVS:  RRR. Heart sounds normal.  ABD/GI/GU: BS+. Abd soft, NTND.  MSK/EXT:   Right BKA wound.  No apparent drainage. SKIN: no apparent skin lesion or wound NEURO: Awake and alert. Oriented appropriately.  No apparent focal neuro deficit. PSYCH: Calm. Normal affect.    Sch Meds:  Scheduled Meds:  enoxaparin  (LOVENOX ) injection  40 mg Subcutaneous Q24H   influenza vac split trivalent PF  0.5 mL Intramuscular Tomorrow-1000   insulin  aspart  0-15 Units Subcutaneous TID WC   insulin  aspart  0-5 Units Subcutaneous QHS   insulin  aspart  5 Units Subcutaneous TID WC   insulin  glargine-yfgn  25 Units Subcutaneous QHS   NIFEdipine   30 mg Oral Daily   pneumococcal 20-valent conjugate vaccine  0.5 mL Intramuscular Tomorrow-1000   Continuous Infusions:  sodium chloride  100 mL/hr at 07/21/24 0942   ceFEPime (MAXIPIME) IV 2 g (07/21/24 0943)   linezolid (ZYVOX) IV 600 mg (07/21/24 1156)   metronidazole      PRN Meds:.acetaminophen , HYDROmorphone (DILAUDID) injection, loperamide, metoCLOPramide  (REGLAN ) injection, ondansetron  (ZOFRAN ) IV, oxyCODONE   Antimicrobials: Anti-infectives (From admission, onward)    Start     Dose/Rate Route Frequency Ordered Stop   07/21/24 1615  metroNIDAZOLE  (FLAGYL ) IVPB 500 mg        500 mg 100 mL/hr over 60 Minutes  Intravenous Every 12 hours 07/21/24 1517     07/21/24 1000  ceFEPIme (MAXIPIME) 2 g in sodium chloride  0.9 % 100 mL IVPB        2 g 200 mL/hr over 30 Minutes Intravenous Every 12 hours 07/21/24 0842     07/20/24 2000  linezolid (ZYVOX) IVPB 600 mg        600 mg 300 mL/hr over 60 Minutes Intravenous Every 12 hours 07/20/24 0737     07/20/24 1000  piperacillin-tazobactam (ZOSYN) IVPB 3.375 g  Status:  Discontinued        3.375 g 12.5 mL/hr over 240 Minutes Intravenous Every 8 hours 07/20/24 0748 07/21/24 0826   07/20/24 0215  piperacillin-tazobactam (ZOSYN) IVPB 3.375 g        3.375 g 100 mL/hr over 30 Minutes Intravenous  Once 07/20/24 0203 07/20/24 0638   07/20/24 0215  linezolid (ZYVOX) IVPB 600 mg        600 mg 300 mL/hr over 60 Minutes Intravenous  Once 07/20/24 0203 07/20/24 0815  I have personally reviewed the following labs and images: CBC: Recent Labs  Lab 07/19/24 2022 07/20/24 0650 07/21/24 0640  WBC 16.0* 13.5* 13.0*  HGB 10.4* 9.3* 9.3*  HCT 31.9* 28.3* 28.4*  MCV 87.6 87.6 87.7  PLT 469* 419* 447*   BMP &GFR Recent Labs  Lab 07/19/24 2022 07/20/24 0650 07/21/24 0640  NA 131* 134* 131*  K 3.4* 3.5 4.1  CL 95* 98 97*  CO2 24 25 24   GLUCOSE 154* 183* 219*  BUN 20 21* 28*  CREATININE 1.47* 1.49* 2.25*  CALCIUM  8.3* 7.9* 7.9*  MG  --  1.9 1.8   Estimated Creatinine Clearance: 41.4 mL/min (A) (by C-G formula based on SCr of 2.25 mg/dL (H)). Liver & Pancreas: Recent Labs  Lab 07/19/24 2022 07/20/24 0650 07/21/24 0640  AST 90* 31 32  ALT 62* 45* 34  ALKPHOS 157* 125 139*  BILITOT 0.8 0.9 0.8  PROT 7.4 6.3* 6.9  ALBUMIN 1.6* <1.5* <1.5*   No results for input(s): LIPASE, AMYLASE in the last 168 hours. No results for input(s): AMMONIA in the last 168 hours. Diabetic: Recent Labs    07/20/24 1216  HGBA1C 15.2*   Recent Labs  Lab 07/20/24 1949 07/20/24 2248 07/21/24 0505 07/21/24 0917 07/21/24 1148  GLUCAP 290* 242* 225* 187*  249*   Cardiac Enzymes: Recent Labs  Lab 07/20/24 1216  CKTOTAL 33*   No results for input(s): PROBNP in the last 8760 hours. Coagulation Profile: No results for input(s): INR, PROTIME in the last 168 hours. Thyroid  Function Tests: No results for input(s): TSH, T4TOTAL, FREET4, T3FREE, THYROIDAB in the last 72 hours. Lipid Profile: No results for input(s): CHOL, HDL, LDLCALC, TRIG, CHOLHDL, LDLDIRECT in the last 72 hours. Anemia Panel: Recent Labs    07/20/24 1216  VITAMINB12 476  FOLATE 14.0  FERRITIN 235  TIBC 141*  IRON 15*  RETICCTPCT 1.2   Urine analysis:    Component Value Date/Time   COLORURINE YELLOW 07/21/2024 1237   APPEARANCEUR CLOUDY (A) 07/21/2024 1237   APPEARANCEUR Cloudy (A) 04/19/2016 1013   LABSPEC 1.016 07/21/2024 1237   PHURINE 5.0 07/21/2024 1237   GLUCOSEU >=500 (A) 07/21/2024 1237   HGBUR NEGATIVE 07/21/2024 1237   BILIRUBINUR NEGATIVE 07/21/2024 1237   BILIRUBINUR Negative 04/19/2016 1013   KETONESUR NEGATIVE 07/21/2024 1237   PROTEINUR >=300 (A) 07/21/2024 1237   NITRITE NEGATIVE 07/21/2024 1237   LEUKOCYTESUR NEGATIVE 07/21/2024 1237   Sepsis Labs: Invalid input(s): PROCALCITONIN, LACTICIDVEN  Microbiology: Recent Results (from the past 240 hours)  Blood culture (routine x 2)     Status: None (Preliminary result)   Collection Time: 07/19/24  8:23 PM   Specimen: BLOOD RIGHT HAND  Result Value Ref Range Status   Specimen Description BLOOD RIGHT HAND  Final   Special Requests   Final    BOTTLES DRAWN AEROBIC AND ANAEROBIC Blood Culture results may not be optimal due to an inadequate volume of blood received in culture bottles   Culture   Final    NO GROWTH 2 DAYS Performed at [facility], [address] [place], [place] [address]    Report Status PENDING  Incomplete  Blood culture (routine x 2)     Status: None (Preliminary result)   Collection Time: 07/19/24  8:23 PM   Specimen: BLOOD LEFT  HAND  Result Value Ref Range Status   Specimen Description BLOOD LEFT HAND  Final   Special Requests   Final    BOTTLES DRAWN AEROBIC  ONLY Blood Culture results may not be optimal due to an inadequate volume of blood received in culture bottles   Culture   Final    NO GROWTH 2 DAYS Performed at [facility], [address], [place], [place] [address]    Report Status PENDING  Incomplete    Radiology Studies: No results found.    Solita Macadam T. Daiki Dicostanzo [facility]  If 7PM-7AM, please contact night-coverage [email or web address] 07/21/2024, 3:17 PM

## 2024-07-21 NOTE — Inpatient Diabetes Management (Addendum)
 Inpatient Diabetes Program Recommendations  AACE/ADA: New Consensus Statement on Inpatient Glycemic Control (2015)  Target Ranges:  Prepandial:   less than 140 mg/dL      Peak postprandial:   less than 180 mg/dL (1-2 hours)      Critically ill patients:  140 - 180 mg/dL   Lab Results  Component Value Date   GLUCAP 187 (H) 07/21/2024   HGBA1C 15.2 (H) 07/20/2024    Latest Reference Range & Units 07/20/24 07:54 07/20/24 12:12 07/20/24 18:20 07/20/24 19:49 07/20/24 22:48 07/21/24 05:05 07/21/24 09:17  Glucose-Capillary 70 - 99 mg/dL 777 (H) 710 (H) 660 (H) 290 (H) 242 (H) 225 (H) 187 (H)   Review of Glycemic Control  Diabetes history: DM2 Outpatient Diabetes medications:  Lantus  22 units at bedtime  Novolog  Sliding Scale (3-6 units) TID   Current orders for Inpatient glycemic control:  Semglee  22 units daily  Novolog  0-9 units TID + 0-5 units at bedtime  Novolog  5 units TID with meals   Inpatient Diabetes Program Recommendations:   Noted:  Patient's A1C is 15.2% - Diabetes Coordinator with see patient.  Novolog  meal coverage was increased to 5 units TID today.  Living Well with Diabetes book ordered.   Addendum @ 1220: Spoke with patient at bedside. Discussed diabetes and home regimen for diabetes control. Patient reports being followed by Nena Hummer, NP with [facility] for diabetes management and currently taking Lantus  22 units at bedtime and Novolog  Sliding Scale (3-6 units).   Patient reports she was not checking her glucose everyday. She did use a FreeStyle Libre3 CGM in the past but then her pharmacy told her they were out of stock so she stopped using it. I encouraged patient if this happens in the future to try another pharmacy or reach out to her provider. Inquired about prior A1C and patient reports not being able to recall last A1C value. Discussed A1C results 15.2% and explained that current A1C indicates an average glucose of 390 mg/dl over the past 2-3  months. Discussed glucose and A1C goals. Discussed importance of checking CBGs and maintaining good CBG control to prevent long-term and short-term complications. Explained how hyperglycemia leads to damage within blood vessels which lead to the common complications seen with uncontrolled diabetes. Discussed impact of nutrition, exercise, stress, sickness, and medications on diabetes control.  Discussed carbohydrates, carbohydrate goals per day and meal, along with portion sizes.   Confirmed with Dr Gonfa CGM samples can be provided to patient.  Educated patient on FreeStyle Libre3 CGM regarding application and changing CGM sensor (alternate every 15 days on back of arms), 1 hour warm-up, how to scan sensor to start a new sensor, and how to use app to check glucose. Patient has been given Freestyle Libre3 sensor sample. Applied FreeStyle Libre3+ sensor to patient's left upper arm. Made bedside RN aware that CGM was applied and CBGs will need to continue although patient is wearing the senor. Provided educational packet regarding FreeStyle Libre3 CGM. Patient using iphone FreeStyle Libre3 app to read FreeStyle Libre sensor and was able to download the app and create an account. Informed patient that it would be requested that attending provider provide Rx for first month of FreeStyle Libre3 sensors and that she will need Fam Med provider to continue to provide Rx for FreeStyle Libre3 sensors going forward.  (Discussed with TOC on coverage and cost of CGM. Prior Auth completed by [facility] and patient will have $0 copay.) Asked patient to be sure to let [place],  NP know about Liane and allow provider to review reports from [place] app so the provider can use the information to continue to make adjustments with DM medications if needed. Patient verbalized understanding of information and has no questions at this time.     Discharge Recommendations: Other recommendations: FreeStyle Libre 3+ CGM [phone]) Long  acting recommendations: Insulin  Glargine (LANTUS ) Solostar Pen to be determined  Short acting recommendations:  Meal + Correction coverage Insulin  aspart (NOVOLOG ) FlexPen  Sensitive Scale.  to be determined Hypoglycemia treatment recommendations: GVoke 1mg  Supply/Referral recommendations: Glucometer Test strips Lancet device Lancets Pen needles - standard   Use Adult Diabetes Insulin  Treatment Post Discharge order set.   Thanks,  Lavanda Search, RN, MSN, [facility]  Inpatient Diabetes Coordinator  Pager [phone] (8a-5p)

## 2024-07-21 NOTE — Telephone Encounter (Signed)
 Pharmacy Patient Advocate Encounter  Received notification from [employer] that Prior Authorization for Freestyle Libre 3 Plus Sensor has been APPROVED from 07/21/2024 to 01/17/2025. Ran test claim, Copay is $0.00. This test claim was processed through [facility]- copay amounts may vary at other pharmacies due to pharmacy/plan contracts, or as the patient moves through the different stages of their insurance plan.   PA #/Case ID/Reference #: [id number] KEY: [id number]

## 2024-07-21 NOTE — Plan of Care (Signed)

## 2024-07-22 ENCOUNTER — Inpatient Hospital Stay (HOSPITAL_COMMUNITY)

## 2024-07-22 DIAGNOSIS — I7090 Unspecified atherosclerosis: Secondary | ICD-10-CM

## 2024-07-22 DIAGNOSIS — M86461 Chronic osteomyelitis with draining sinus, right tibia and fibula: Secondary | ICD-10-CM | POA: Diagnosis not present

## 2024-07-22 DIAGNOSIS — N179 Acute kidney failure, unspecified: Secondary | ICD-10-CM | POA: Diagnosis not present

## 2024-07-22 DIAGNOSIS — T879 Unspecified complications of amputation stump: Secondary | ICD-10-CM | POA: Diagnosis not present

## 2024-07-22 LAB — GLUCOSE, CAPILLARY
Glucose-Capillary: 166 mg/dL — ABNORMAL HIGH (ref 70–99)
Glucose-Capillary: 199 mg/dL — ABNORMAL HIGH (ref 70–99)
Glucose-Capillary: 81 mg/dL (ref 70–99)
Glucose-Capillary: 220 mg/dL — ABNORMAL HIGH (ref 70–99)

## 2024-07-22 LAB — CBC
HCT: 30.7 % — ABNORMAL LOW (ref 36.0–46.0)
Hemoglobin: 10 g/dL — ABNORMAL LOW (ref 12.0–15.0)
MCH: 28.7 pg (ref 26.0–34.0)
MCHC: 32.6 g/dL (ref 30.0–36.0)
MCV: 88 fL (ref 80.0–100.0)
Platelets: 594 K/uL — ABNORMAL HIGH (ref 150–400)
RBC: 3.49 MIL/uL — ABNORMAL LOW (ref 3.87–5.11)
RDW: 13.1 % (ref 11.5–15.5)
WBC: 18.8 K/uL — ABNORMAL HIGH (ref 4.0–10.5)
nRBC: 0 % (ref 0.0–0.2)

## 2024-07-22 LAB — RENAL FUNCTION PANEL
Albumin: <1.5 g/dL — ABNORMAL LOW (ref 3.5–5.0)
Anion gap: 10 (ref 5–15)
BUN: 22 mg/dL — ABNORMAL HIGH (ref 6–20)
CO2: 21 mmol/L — ABNORMAL LOW (ref 22–32)
Calcium: 8 mg/dL — ABNORMAL LOW (ref 8.9–10.3)
Chloride: 102 mmol/L (ref 98–111)
Creatinine, Ser: 1.73 mg/dL — ABNORMAL HIGH (ref 0.44–1.00)
GFR, Estimated: 39 mL/min — ABNORMAL LOW (ref >60)
Glucose, Bld: 144 mg/dL — ABNORMAL HIGH (ref 70–99)
Phosphorus: 3.5 mg/dL (ref 2.5–4.6)
Potassium: 3.7 mmol/L (ref 3.5–5.1)
Sodium: 133 mmol/L — ABNORMAL LOW (ref 135–145)

## 2024-07-22 LAB — VAS US ABI WITH/WO TBI: Left ABI: 1.35

## 2024-07-22 LAB — MAGNESIUM: Magnesium: 2.1 mg/dL (ref 1.7–2.4)

## 2024-07-22 MED ORDER — INSULIN GLARGINE-YFGN 100 UNIT/ML ~~LOC~~ SOLN
35.0000 [IU] | Freq: Every day | SUBCUTANEOUS | Status: DC
  Administered 2024-07-22 – 2024-07-23 (×2): 35 [IU] via SUBCUTANEOUS
  Filled 2024-07-22 (×3): qty 0.35

## 2024-07-22 MED ORDER — LIVING WELL WITH DIABETES BOOK
Freq: Once | Status: AC
  Filled 2024-07-22: qty 1

## 2024-07-22 MED ORDER — INSULIN ASPART 100 UNIT/ML IJ SOLN
7.0000 [IU] | Freq: Three times a day (TID) | INTRAMUSCULAR | Status: DC
  Administered 2024-07-22 – 2024-07-27 (×12): 7 [IU] via SUBCUTANEOUS

## 2024-07-22 MED ORDER — METOCLOPRAMIDE HCL 5 MG/ML IJ SOLN
5.0000 mg | Freq: Three times a day (TID) | INTRAMUSCULAR | Status: DC
  Administered 2024-07-22 – 2024-07-27 (×16): 5 mg via INTRAVENOUS
  Filled 2024-07-22 (×16): qty 2

## 2024-07-22 MED ORDER — ONDANSETRON HCL 4 MG/2ML IJ SOLN
4.0000 mg | Freq: Four times a day (QID) | INTRAMUSCULAR | Status: DC | PRN
  Administered 2024-07-22 – 2024-07-25 (×6): 4 mg via INTRAVENOUS
  Filled 2024-07-22 (×6): qty 2

## 2024-07-22 MED ORDER — SODIUM CHLORIDE 0.9 % IV SOLN: INTRAVENOUS | Status: AC

## 2024-07-22 NOTE — Progress Notes (Signed)
 PROGRESS NOTE  Basha Mishra FMW:[id number] DOB: June 13, 1986   PCP: [name] [name], NP  Patient is from: Home. Uses prosthesis. Also has WC   DOA: 07/19/2024 LOS: 2  Chief complaints Chief Complaint  Patient presents with   Leg Swelling     Brief Narrative / Interim history: 38 y.o. female with PMH of IDDM-2, HTN and Rt BKA wound presenting with increasing right BKA swelling and purulent drainage, and admitted with working diagnosis of sepsis due to right BKA cellulitis/osteomyelitis.   In ED, febrile to 100.4.  Tachycardic to 117.  Other vital stable.  Na 131>>> 134.  K3.4.  Glucose 154.  Cr 1.47>> 1.49 (0.86 in 03/2023). BUN 20.  AST 90>> 31.  ALT 62 >> 45.  WBC 16>> 13.5.  Hgb 10.4 (baseline)>> 9.3.  Lactic acid negative.  X-ray concerning for osteo/gas.  Blood culture and MRI ordered.  Started on broad-spectrum antibiotics.  Orthopedic surgery consulted.    MRI showed large complex fluid collection containing multiple air bubbles within soft tissue of the distal stump suspicious for abscess and underlying gas-forming infection, possible early distal tibial osteomyelitis, possible patellar bursitis.  Remains on broad-spectrum antibiotics.  Plan for surgical intervention on 10/22 by Dr. Harden.    Subjective: Seen and examined earlier this morning.  Reports nausea and vomiting this morning.  She is actively vomiting during my evaluation.  Denies abdominal pain.  Reports making good amount of urine.   Assessment and plan: Sepsis due to right BKA osteomyelitis and abscess: has fever and leukocytosis on presentation.  Wound ongoing for about a month.  X-ray concerning for distal tibial osteomyelitis.  MRI concerning for abscess and early cellulitis.  CRP 23.  ESR > 140.  Leukocytosis worse. -Continue Zyvox, cefepime and Flagyl . -Plan for surgical intervention on 10/22 -Appreciate help by wound care   IDDM-2 with hyperglycemia: A1c 15.2% (was 11.9% in 03/2023).  On Lantus  22  units at night and sliding scale insulin  at home.  Not compliant. Recent Labs  Lab 07/21/24 1633 07/21/24 2136 07/22/24 0602 07/22/24 1142 07/22/24 1616  GLUCAP 188* 187* 166* 199* 81  -Continue SSI-moderate -NovoLog  7 units 3 times daily with meals -Basal insulin  25 units at bedtime -Consult diabetic coordinator  Nausea and vomiting: Gastroparesis?  Abdominal exam benign. - Scheduled Reglan  with as needed Zofran .  No prolonged QT. -Continue IV fluid. -Glycemic control   AKI: Suspect CKD versus AKI.  Cr 1.5.,  0.86 in 03/2023.  Not on nephrotoxic meds.  Due to N/B?  UA with > 300 protein.  Renal US  unremarkable. Recent Labs    07/19/24 2022 07/20/24 0650 07/21/24 0640 07/22/24 1011  BUN 20 21* 28* 22*  CREATININE 1.47* 1.49* 2.25* 1.73*  -Continue IV NS at 100 cc an hour -Check intake and output -Avoid nephrotoxic meds -Changed Zosyn to cefepime   Hypokalemia -Monitor replenish K and Mg as appropriate   Pseudohyponatremia: Corrects to normal for hyperglycemia   Essential hypertension: Normotensive for most part -Continue home Procardia    Anemia of chronic disease: Relatively stable.  Anemia panel consistent with anemia of chronic disease. Recent Labs    07/19/24 2022 07/20/24 0650 07/21/24 0640 07/22/24 1011  HGB 10.4* 9.3* 9.3* 10.0*  - Continue monitoring   Elevated liver enzymes: Pattern consistent with alcohol or rhabdo.  CK not elevated.  Denies alcohol.  Resolved.  Class I obesity Body mass index is 33.52 kg/m.           DVT prophylaxis:  enoxaparin  (  LOVENOX ) injection 40 mg Start: 07/20/24 1130  Code Status: Full code Family Communication: None at bedside Level of care: Telemetry Surgical Status is: Inpatient Remains inpatient appropriate because: AKI, osteomyelitis/abscess, nausea and vomiting   Final disposition: Likely home once medically stable   55 minutes with more than 50% spent in reviewing records, counseling patient/family  and coordinating care.  Consultants:  Orthopedic surgery  Procedures: None  Microbiology summarized: Blood cultures NGTD  Objective: Vitals:   07/22/24 0100 07/22/24 0433 07/22/24 0725 07/22/24 1358  BP:  132/81 (!) 143/83 123/72  Pulse:  (!) 102 98 95  Resp:  18  16  Temp: 99.4 F (37.4 C) (!) 97.3 F (36.3 C) 98.6 F (37 C) 98.6 F (37 C)  TempSrc: Oral Oral Oral Oral  SpO2:  97% 97% 97%  Weight:      Height:        Examination: GENERAL: No apparent distress.  Nontoxic. HEENT: MMM.  Vision and hearing grossly intact.  NECK: Supple.  No apparent JVD.  RESP:  No IWOB.  Fair aeration bilaterally. CVS:  RRR. Heart sounds normal.  ABD/GI/GU: BS+. Abd soft, NTND.  MSK/EXT:   Right BKA wound.  No apparent drainage. SKIN: no apparent skin lesion or wound NEURO: Awake and alert. Oriented appropriately.  No apparent focal neuro deficit. PSYCH: Calm. Normal affect.    Sch Meds:  Scheduled Meds:  enoxaparin  (LOVENOX ) injection  40 mg Subcutaneous Q24H   influenza vac split trivalent PF  0.5 mL Intramuscular Tomorrow-1000   insulin  aspart  0-15 Units Subcutaneous TID WC   insulin  aspart  0-5 Units Subcutaneous QHS   insulin  aspart  7 Units Subcutaneous TID WC   insulin  glargine-yfgn  35 Units Subcutaneous QHS   metoCLOPramide  (REGLAN ) injection  5 mg Intravenous TID AC & HS   NIFEdipine   30 mg Oral Daily   pneumococcal 20-valent conjugate vaccine  0.5 mL Intramuscular Tomorrow-1000   Continuous Infusions:  sodium chloride  100 mL/hr at 07/22/24 1311   ceFEPime (MAXIPIME) IV 2 g (07/22/24 0838)   linezolid (ZYVOX) IV 600 mg (07/22/24 1024)   metronidazole  100 mL/hr at 07/22/24 0600   PRN Meds:.acetaminophen , HYDROmorphone (DILAUDID) injection, loperamide, ondansetron  (ZOFRAN ) IV, oxyCODONE   Antimicrobials: Anti-infectives (From admission, onward)    Start     Dose/Rate Route Frequency Ordered Stop   07/21/24 1800  metroNIDAZOLE  (FLAGYL ) IVPB 500 mg        500  mg 100 mL/hr over 60 Minutes Intravenous Every 12 hours 07/21/24 1517     07/21/24 1000  ceFEPIme (MAXIPIME) 2 g in sodium chloride  0.9 % 100 mL IVPB        2 g 200 mL/hr over 30 Minutes Intravenous Every 12 hours 07/21/24 0842     07/20/24 2000  linezolid (ZYVOX) IVPB 600 mg        600 mg 300 mL/hr over 60 Minutes Intravenous Every 12 hours 07/20/24 0737     07/20/24 1000  piperacillin-tazobactam (ZOSYN) IVPB 3.375 g  Status:  Discontinued        3.375 g 12.5 mL/hr over 240 Minutes Intravenous Every 8 hours 07/20/24 0748 07/21/24 0826   07/20/24 0215  piperacillin-tazobactam (ZOSYN) IVPB 3.375 g        3.375 g 100 mL/hr over 30 Minutes Intravenous  Once 07/20/24 0203 07/20/24 0638   07/20/24 0215  linezolid (ZYVOX) IVPB 600 mg        600 mg 300 mL/hr over 60 Minutes Intravenous  Once 07/20/24 0203 07/20/24  9184        I have personally reviewed the following labs and images: CBC: Recent Labs  Lab 07/19/24 2022 07/20/24 0650 07/21/24 0640 07/22/24 1011  WBC 16.0* 13.5* 13.0* 18.8*  HGB 10.4* 9.3* 9.3* 10.0*  HCT 31.9* 28.3* 28.4* 30.7*  MCV 87.6 87.6 87.7 88.0  PLT 469* 419* 447* 594*   BMP &GFR Recent Labs  Lab 07/19/24 2022 07/20/24 0650 07/21/24 0640 07/22/24 1011  NA 131* 134* 131* 133*  K 3.4* 3.5 4.1 3.7  CL 95* 98 97* 102  CO2 24 25 24  21*  GLUCOSE 154* 183* 219* 144*  BUN 20 21* 28* 22*  CREATININE 1.47* 1.49* 2.25* 1.73*  CALCIUM  8.3* 7.9* 7.9* 8.0*  MG  --  1.9 1.8 2.1  PHOS  --   --   --  3.5   Estimated Creatinine Clearance: 53.8 mL/min (A) (by C-G formula based on SCr of 1.73 mg/dL (H)). Liver & Pancreas: Recent Labs  Lab 07/19/24 2022 07/20/24 0650 07/21/24 0640 07/22/24 1011  AST 90* 31 32  --   ALT 62* 45* 34  --   ALKPHOS 157* 125 139*  --   BILITOT 0.8 0.9 0.8  --   PROT 7.4 6.3* 6.9  --   ALBUMIN 1.6* <1.5* <1.5* <1.5*   No results for input(s): LIPASE, AMYLASE in the last 168 hours. No results for input(s): AMMONIA in the  last 168 hours. Diabetic: Recent Labs    07/20/24 1216  HGBA1C 15.2*   Recent Labs  Lab 07/21/24 1633 07/21/24 2136 07/22/24 0602 07/22/24 1142 07/22/24 1616  GLUCAP 188* 187* 166* 199* 81   Cardiac Enzymes: Recent Labs  Lab 07/20/24 1216  CKTOTAL 33*   No results for input(s): PROBNP in the last 8760 hours. Coagulation Profile: No results for input(s): INR, PROTIME in the last 168 hours. Thyroid  Function Tests: No results for input(s): TSH, T4TOTAL, FREET4, T3FREE, THYROIDAB in the last 72 hours. Lipid Profile: No results for input(s): CHOL, HDL, LDLCALC, TRIG, CHOLHDL, LDLDIRECT in the last 72 hours. Anemia Panel: Recent Labs    07/20/24 1216  VITAMINB12 476  FOLATE 14.0  FERRITIN 235  TIBC 141*  IRON 15*  RETICCTPCT 1.2   Urine analysis:    Component Value Date/Time   COLORURINE YELLOW 07/21/2024 1237   APPEARANCEUR CLOUDY (A) 07/21/2024 1237   APPEARANCEUR Cloudy (A) 04/19/2016 1013   LABSPEC 1.016 07/21/2024 1237   PHURINE 5.0 07/21/2024 1237   GLUCOSEU >=500 (A) 07/21/2024 1237   HGBUR NEGATIVE 07/21/2024 1237   BILIRUBINUR NEGATIVE 07/21/2024 1237   BILIRUBINUR Negative 04/19/2016 1013   KETONESUR NEGATIVE 07/21/2024 1237   PROTEINUR >=300 (A) 07/21/2024 1237   NITRITE NEGATIVE 07/21/2024 1237   LEUKOCYTESUR NEGATIVE 07/21/2024 1237   Sepsis Labs: Invalid input(s): PROCALCITONIN, LACTICIDVEN  Microbiology: Recent Results (from the past 240 hours)  Blood culture (routine x 2)     Status: None (Preliminary result)   Collection Time: 07/19/24  8:23 PM   Specimen: BLOOD RIGHT HAND  Result Value Ref Range Status   Specimen Description BLOOD RIGHT HAND  Final   Special Requests   Final    BOTTLES DRAWN AEROBIC AND ANAEROBIC Blood Culture results may not be optimal due to an inadequate volume of blood received in culture bottles   Culture   Final    NO GROWTH 3 DAYS Performed at [facility], [address], [place], [place] [address]    Report Status PENDING  Incomplete  Blood culture (routine x 2)     Status: None (Preliminary result)   Collection Time: 07/19/24  8:23 PM   Specimen: BLOOD LEFT HAND  Result Value Ref Range Status   Specimen Description BLOOD LEFT HAND  Final   Special Requests   Final    BOTTLES DRAWN AEROBIC ONLY Blood Culture results may not be optimal due to an inadequate volume of blood received in culture bottles   Culture   Final    NO GROWTH 3 DAYS Performed at [facility], [address] [place], [place] [address]    Report Status PENDING  Incomplete    Radiology Studies: VAS US  ABI WITH/WO TBI Result Date: 07/22/2024  LOWER EXTREMITY DOPPLER STUDY Patient Name:  [name]  Date of Exam:   07/22/2024 Medical Rec #: [id number]      Accession #:    [id number] Date of Birth: [birth year]     Patient Gender: F Patient Age:   20 years Exam Location:  [facility] Procedure:      VAS US  ABI WITH/WO TBI Referring Phys: DEEPAK RAMANATHAN --------------------------------------------------------------------------------  Indications: Peripheral artery disease. High Risk Factors: Hypertension, Diabetes. Other Factors: 10/12/2021 - R BKA.  Comparison Study: No prior studies. Performing Technologist: Gerome Ny RVT  Examination Guidelines: A complete evaluation includes at minimum, Doppler waveform signals and systolic blood pressure reading at the level of bilateral brachial, anterior tibial, and posterior tibial arteries, when vessel segments are accessible. Bilateral testing is considered an integral part of a complete examination. Photoelectric Plethysmograph (PPG) waveforms and toe systolic pressure readings are included as required and additional duplex testing as needed. Limited examinations for reoccurring indications may be performed as noted.  ABI Findings: +--------+------------------+-----+---------+--------+ Right   Rt Pressure (mmHg)IndexWaveform  Comment  +--------+------------------+-----+---------+--------+ [id number]                    triphasic         +--------+------------------+-----+---------+--------+ PTA                                     BKA      +--------+------------------+-----+---------+--------+ DP                                      BKA      +--------+------------------+-----+---------+--------+ +---------+------------------+-----+-----------+-------+ Left     Lt Pressure (mmHg)IndexWaveform   Comment +---------+------------------+-----+-----------+-------+ Brachial 138                    triphasic          +---------+------------------+-----+-----------+-------+ PTA      200               1.35 multiphasic        +---------+------------------+-----+-----------+-------+ DP       186               1.26 multiphasic        +---------+------------------+-----+-----------+-------+ Great Toe137               0.93                    +---------+------------------+-----+-----------+-------+ +-------+-----------+-----------+------------+------------+ ABI/TBIToday's ABIToday's TBIPrevious ABIPrevious TBI +-------+-----------+-----------+------------+------------+ Right  BKA        BKA                                 +-------+-----------+-----------+------------+------------+  Left   1.35       0.93                                +-------+-----------+-----------+------------+------------+   Summary: Right:  Unable to obtain ABI/TBI due to below the knee amputation. Left: Resting left ankle-brachial index indicates noncompressible left lower extremity arteries. The left toe-brachial index is normal.  *See table(s) above for measurements and observations.  Electronically signed by Norman Serve on 07/22/2024 at 11:33:30 AM.    Final       Theodora Lalanne T. Gianpaolo Mindel [facility]  If 7PM-7AM, please contact night-coverage [email or web address] 07/22/2024, 4:30 PM

## 2024-07-22 NOTE — Inpatient Diabetes Management (Signed)
 Inpatient Diabetes Program Recommendations  AACE/ADA: New Consensus Statement on Inpatient Glycemic Control   Target Ranges:  Prepandial:   less than 140 mg/dL      Peak postprandial:   less than 180 mg/dL (1-2 hours)      Critically ill patients:  140 - 180 mg/dL   Lab Results  Component Value Date   GLUCAP 199 (H) 07/22/2024   HGBA1C 15.2 (H) 07/20/2024    Review of Glycemic Control Diabetes history: DM2  Outpatient Diabetes medications:  Lantus  20 units at bedtime  Novolog  Sliding Scale (3-6 units) TID    Current orders for Inpatient glycemic control:  Semglee  35 units daily  Novolog  0-15 units TID + 0-5 units at bedtime  Novolog  7 units TID with meals  Inpatient Diabetes Program Recommendations:   Spoke with patient at bedside. Patient was able to recite CBG and A1C goals that were discussed yesterday. She was very appreciative of the education on FreeStyle Libre3 CGM and now being able to see her blood glucose on her mobile device. Living Well with Diabetes book ordered yesterday but patient states she never received. Re-ordered and asked bedside RN to please make sure patient receives. Patient has no further questions or concerns at this time.   Discharge Recommendations: Other recommendations: FreeStyle Libre 3+ CGM [phone]) Long acting recommendations: Insulin  Glargine (LANTUS ) Solostar Pen to be determined  Short acting recommendations:  Meal + Correction coverage Insulin  aspart (NOVOLOG ) FlexPen  Sensitive Scale.  to be determined Hypoglycemia treatment recommendations: GVoke 1mg  Supply/Referral recommendations: Glucometer Test strips Lancet device Lancets Pen needles - standard   Use Adult Diabetes Insulin  Treatment Post Discharge order set.  Thanks,  Lavanda Search, RN, MSN, [facility]  Inpatient Diabetes Coordinator  Pager [phone] (8a-5p)

## 2024-07-22 NOTE — Progress Notes (Signed)
 ABI's have been completed. Preliminary results can be found in CV Proc through chart review.   07/22/24 9:34 AM Cathlyn Collet RVT

## 2024-07-23 ENCOUNTER — Encounter (HOSPITAL_COMMUNITY)
Admission: EM | Disposition: A | Discharge status: Home Health | Payer: Self-pay | Source: Home / Self Care | Attending: Internal Medicine

## 2024-07-23 ENCOUNTER — Inpatient Hospital Stay (HOSPITAL_COMMUNITY): Admitting: Anesthesiology

## 2024-07-23 ENCOUNTER — Encounter (HOSPITAL_COMMUNITY): Payer: Self-pay | Admitting: Family Medicine

## 2024-07-23 DIAGNOSIS — E871 Hypo-osmolality and hyponatremia: Secondary | ICD-10-CM | POA: Diagnosis not present

## 2024-07-23 DIAGNOSIS — E1165 Type 2 diabetes mellitus with hyperglycemia: Secondary | ICD-10-CM

## 2024-07-23 DIAGNOSIS — T879 Unspecified complications of amputation stump: Secondary | ICD-10-CM | POA: Diagnosis not present

## 2024-07-23 DIAGNOSIS — I10 Essential (primary) hypertension: Secondary | ICD-10-CM | POA: Diagnosis not present

## 2024-07-23 DIAGNOSIS — T8781 Dehiscence of amputation stump: Secondary | ICD-10-CM

## 2024-07-23 HISTORY — PX: REVISION AMPUTATION, BELOW THE KNEE: SHX7423

## 2024-07-23 LAB — RENAL FUNCTION PANEL
Albumin: <1.5 g/dL — ABNORMAL LOW (ref 3.5–5.0)
Anion gap: 9 (ref 5–15)
BUN: 17 mg/dL (ref 6–20)
CO2: 20 mmol/L — ABNORMAL LOW (ref 22–32)
Calcium: 7.9 mg/dL — ABNORMAL LOW (ref 8.9–10.3)
Chloride: 105 mmol/L (ref 98–111)
Creatinine, Ser: 1.49 mg/dL — ABNORMAL HIGH (ref 0.44–1.00)
GFR, Estimated: 46 mL/min — ABNORMAL LOW (ref >60)
Glucose, Bld: 183 mg/dL — ABNORMAL HIGH (ref 70–99)
Phosphorus: 3.1 mg/dL (ref 2.5–4.6)
Potassium: 3.5 mmol/L (ref 3.5–5.1)
Sodium: 134 mmol/L — ABNORMAL LOW (ref 135–145)

## 2024-07-23 LAB — CBC
HCT: 29.4 % — ABNORMAL LOW (ref 36.0–46.0)
Hemoglobin: 9.4 g/dL — ABNORMAL LOW (ref 12.0–15.0)
MCH: 28.4 pg (ref 26.0–34.0)
MCHC: 32 g/dL (ref 30.0–36.0)
MCV: 88.8 fL (ref 80.0–100.0)
Platelets: 624 K/uL — ABNORMAL HIGH (ref 150–400)
RBC: 3.31 MIL/uL — ABNORMAL LOW (ref 3.87–5.11)
RDW: 12.9 % (ref 11.5–15.5)
WBC: 17 K/uL — ABNORMAL HIGH (ref 4.0–10.5)
nRBC: 0 % (ref 0.0–0.2)

## 2024-07-23 LAB — GLUCOSE, CAPILLARY
Glucose-Capillary: 190 mg/dL — ABNORMAL HIGH (ref 70–99)
Glucose-Capillary: 156 mg/dL — ABNORMAL HIGH (ref 70–99)
Glucose-Capillary: 148 mg/dL — ABNORMAL HIGH (ref 70–99)
Glucose-Capillary: 319 mg/dL — ABNORMAL HIGH (ref 70–99)
Glucose-Capillary: 416 mg/dL — ABNORMAL HIGH (ref 70–99)
Glucose-Capillary: 420 mg/dL — ABNORMAL HIGH (ref 70–99)

## 2024-07-23 LAB — MAGNESIUM: Magnesium: 2 mg/dL (ref 1.7–2.4)

## 2024-07-23 LAB — POCT PREGNANCY, URINE: Preg Test, Ur: NEGATIVE

## 2024-07-23 LAB — MRSA NEXT GEN BY PCR, NASAL: MRSA by PCR Next Gen: NOT DETECTED

## 2024-07-23 SURGERY — REVISION AMPUTATION, BELOW THE KNEE
Anesthesia: General | Site: Knee | Laterality: Right

## 2024-07-23 MED ORDER — AMISULPRIDE (ANTIEMETIC) 5 MG/2ML IV SOLN: 10.0000 mg | Freq: Once | INTRAVENOUS | Status: DC | PRN

## 2024-07-23 MED ORDER — VANCOMYCIN HCL 1000 MG IV SOLR
INTRAVENOUS | Status: AC
Start: 2024-07-23 — End: 2024-07-23
  Filled 2024-07-23: qty 20

## 2024-07-23 MED ORDER — TOBRAMYCIN SULFATE 1.2 G IJ SOLR
INTRAMUSCULAR | Status: DC | PRN
  Administered 2024-07-23: 1.2 g

## 2024-07-23 MED ORDER — PHENYLEPHRINE 80 MCG/ML (10ML) SYRINGE FOR IV PUSH (FOR BLOOD PRESSURE SUPPORT)
PREFILLED_SYRINGE | INTRAVENOUS | Status: DC | PRN
  Administered 2024-07-23 (×2): 160 ug via INTRAVENOUS
  Administered 2024-07-23 (×2): 80 ug via INTRAVENOUS

## 2024-07-23 MED ORDER — MIDAZOLAM HCL 2 MG/2ML IJ SOLN
INTRAMUSCULAR | Status: AC
Start: 2024-07-23 — End: 2024-07-23
  Administered 2024-07-23: 2 mg via INTRAVENOUS
  Filled 2024-07-23: qty 2

## 2024-07-23 MED ORDER — PROCHLORPERAZINE EDISYLATE 10 MG/2ML IJ SOLN
5.0000 mg | Freq: Once | INTRAMUSCULAR | Status: AC
  Administered 2024-07-23: 5 mg via INTRAVENOUS
  Filled 2024-07-23: qty 2

## 2024-07-23 MED ORDER — 0.9 % SODIUM CHLORIDE (POUR BTL) OPTIME
TOPICAL | Status: DC | PRN
  Administered 2024-07-23: 1000 mL

## 2024-07-23 MED ORDER — VASHE WOUND IRRIGATION OPTIME
TOPICAL | Status: DC | PRN
  Administered 2024-07-23 (×2): 34 [oz_av]

## 2024-07-23 MED ORDER — CHLORHEXIDINE GLUCONATE 0.12 % MT SOLN
OROMUCOSAL | Status: AC
  Filled 2024-07-23: qty 15

## 2024-07-23 MED ORDER — LACTATED RINGERS IV SOLN
INTRAVENOUS | Status: DC
Start: 2024-07-23 — End: 2024-07-23

## 2024-07-23 MED ORDER — FENTANYL CITRATE (PF) 100 MCG/2ML IJ SOLN: 25.0000 ug | INTRAMUSCULAR | Status: DC | PRN

## 2024-07-23 MED ORDER — PROPOFOL 10 MG/ML IV BOLUS
INTRAVENOUS | Status: AC
  Filled 2024-07-23: qty 20

## 2024-07-23 MED ORDER — TOBRAMYCIN SULFATE 1.2 G IJ SOLR
INTRAMUSCULAR | Status: AC
Start: 2024-07-23 — End: 2024-07-23
  Filled 2024-07-23: qty 1.2

## 2024-07-23 MED ORDER — VANCOMYCIN HCL 1000 MG IV SOLR
INTRAVENOUS | Status: DC | PRN
  Administered 2024-07-23: 1000 mg via TOPICAL

## 2024-07-23 MED ORDER — PROPOFOL 500 MG/50ML IV EMUL
INTRAVENOUS | Status: AC
Start: 2024-07-23 — End: 2024-07-23
  Filled 2024-07-23: qty 50

## 2024-07-23 MED ORDER — SODIUM CHLORIDE 0.9 % IV SOLN
2.0000 g | Freq: Three times a day (TID) | INTRAVENOUS | Status: DC
  Administered 2024-07-23 – 2024-07-24 (×2): 2 g via INTRAVENOUS
  Filled 2024-07-23 (×3): qty 12.5

## 2024-07-23 MED ORDER — FENTANYL CITRATE (PF) 100 MCG/2ML IJ SOLN
INTRAMUSCULAR | Status: AC
  Filled 2024-07-23: qty 2

## 2024-07-23 MED ORDER — PROPOFOL 1000 MG/100ML IV EMUL
INTRAVENOUS | Status: AC
  Filled 2024-07-23: qty 100

## 2024-07-23 MED ORDER — ONDANSETRON HCL 4 MG/2ML IJ SOLN
INTRAMUSCULAR | Status: DC | PRN
  Administered 2024-07-23: 4 mg via INTRAVENOUS

## 2024-07-23 MED ORDER — MIDAZOLAM HCL 2 MG/2ML IJ SOLN
INTRAMUSCULAR | Status: AC
  Filled 2024-07-23: qty 2

## 2024-07-23 MED ORDER — DEXAMETHASONE SOD PHOSPHATE PF 10 MG/ML IJ SOLN
INTRAMUSCULAR | Status: DC | PRN
  Administered 2024-07-23 (×2): 5 mg via PERINEURAL

## 2024-07-23 MED ORDER — FENTANYL CITRATE (PF) 100 MCG/2ML IJ SOLN: 50.0000 ug | Freq: Once | INTRAMUSCULAR | Status: AC

## 2024-07-23 MED ORDER — INSULIN ASPART 100 UNIT/ML IJ SOLN
0.0000 [IU] | INTRAMUSCULAR | Status: DC | PRN
  Administered 2024-07-23: 2 [IU] via SUBCUTANEOUS

## 2024-07-23 MED ORDER — OXYCODONE HCL 5 MG PO TABS: 5.0000 mg | ORAL_TABLET | Freq: Once | ORAL | Status: DC | PRN

## 2024-07-23 MED ORDER — OXYCODONE HCL 5 MG/5ML PO SOLN: 5.0000 mg | Freq: Once | ORAL | Status: DC | PRN

## 2024-07-23 MED ORDER — MIDAZOLAM HCL (PF) 2 MG/2ML IJ SOLN: 2.0000 mg | Freq: Once | INTRAMUSCULAR | Status: AC

## 2024-07-23 MED ORDER — ROPIVACAINE HCL 5 MG/ML IJ SOLN
INTRAMUSCULAR | Status: DC | PRN
  Administered 2024-07-23: 30 mL via PERINEURAL
  Administered 2024-07-23: 20 mL via PERINEURAL

## 2024-07-23 MED ORDER — FENTANYL CITRATE (PF) 100 MCG/2ML IJ SOLN
INTRAMUSCULAR | Status: AC
  Administered 2024-07-23: 50 ug via INTRAVENOUS
  Filled 2024-07-23: qty 2

## 2024-07-23 MED ORDER — ACETAMINOPHEN 500 MG PO TABS
1000.0000 mg | ORAL_TABLET | Freq: Once | ORAL | Status: AC
  Administered 2024-07-23: 1000 mg via ORAL
  Filled 2024-07-23: qty 2

## 2024-07-23 MED ORDER — LIDOCAINE 2% (20 MG/ML) 5 ML SYRINGE
INTRAMUSCULAR | Status: DC | PRN
  Administered 2024-07-23: 20 mg via INTRAVENOUS

## 2024-07-23 MED ORDER — PROPOFOL 10 MG/ML IV BOLUS
INTRAVENOUS | Status: DC | PRN
  Administered 2024-07-23: 200 mg via INTRAVENOUS

## 2024-07-23 MED ORDER — INSULIN ASPART 100 UNIT/ML IJ SOLN
6.0000 [IU] | Freq: Once | INTRAMUSCULAR | Status: AC
  Administered 2024-07-24: 6 [IU] via SUBCUTANEOUS

## 2024-07-23 SURGICAL SUPPLY — 32 items
BAG COUNTER SPONGE SURGICOUNT (BAG) IMPLANT
BLADE SAW RECIP 87.9 MT (BLADE) ×1 IMPLANT
BLADE SURG 21 STRL SS (BLADE) ×1 IMPLANT
BNDG COHESIVE 6X5 TAN ST LF (GAUZE/BANDAGES/DRESSINGS) IMPLANT
CANISTER WOUND CARE 500ML ATS (WOUND CARE) ×1 IMPLANT
CANISTER WOUNDNEG PRESSURE 500 (CANNISTER) IMPLANT
COVER SURGICAL LIGHT HANDLE (MISCELLANEOUS) ×1 IMPLANT
CUFF TRNQT CYL 34X4.125X (TOURNIQUET CUFF) ×1 IMPLANT
DRAPE INCISE IOBAN 66X45 STRL (DRAPES) ×1 IMPLANT
DRAPE U-SHAPE 47X51 STRL (DRAPES) ×1 IMPLANT
DRSG VAC PEEL AND PLACE LRG (GAUZE/BANDAGES/DRESSINGS) ×1 IMPLANT
DURAPREP 26ML APPLICATOR (WOUND CARE) ×1 IMPLANT
ELECTRODE REM PT RTRN 9FT ADLT (ELECTROSURGICAL) ×1 IMPLANT
GLOVE BIOGEL PI IND STRL 9 (GLOVE) ×1 IMPLANT
GLOVE SURG ORTHO 9.0 STRL STRW (GLOVE) ×1 IMPLANT
GOWN STRL REUS W/ TWL XL LVL3 (GOWN DISPOSABLE) ×2 IMPLANT
GRAFT SKIN WND MICRO 38 (Tissue) IMPLANT
KIT BASIN OR (CUSTOM PROCEDURE TRAY) ×1 IMPLANT
KIT TURNOVER KIT B (KITS) ×1 IMPLANT
MANIFOLD NEPTUNE II (INSTRUMENTS) ×1 IMPLANT
PACK ORTHO EXTREMITY (CUSTOM PROCEDURE TRAY) ×1 IMPLANT
PAD ARMBOARD POSITIONER FOAM (MISCELLANEOUS) ×1 IMPLANT
SOLN 0.9% NACL POUR BTL 1000ML (IV SOLUTION) ×1 IMPLANT
SPONGE T-LAP 18X18 ~~LOC~~+RFID (SPONGE) IMPLANT
STAPLER SKIN PROX 35W (STAPLE) IMPLANT
STOCKINETTE IMPERVIOUS LG (DRAPES) ×1 IMPLANT
SUT ETHILON 2 0 PSLX (SUTURE) IMPLANT
SUT SILK 2-0 18XBRD TIE 12 (SUTURE) ×1 IMPLANT
SUT VIC AB 1 CTX 27 (SUTURE) ×2 IMPLANT
TOWEL GREEN STERILE (TOWEL DISPOSABLE) ×1 IMPLANT
TUBE CONNECTING 12X1/4 (SUCTIONS) ×1 IMPLANT
YANKAUER SUCT BULB TIP NO VENT (SUCTIONS) ×1 IMPLANT

## 2024-07-23 NOTE — Anesthesia Postprocedure Evaluation (Signed)
 Anesthesia Post Note  Patient: [name]  Procedure(s) Performed: REVISION AMPUTATION, BELOW THE KNEE RIGHT (Right: Knee)     Patient location during evaluation: PACU Anesthesia Type: General and Regional Level of consciousness: awake and alert Pain management: pain level controlled Vital Signs Assessment: post-procedure vital signs reviewed and stable Respiratory status: spontaneous breathing, nonlabored ventilation, respiratory function stable and patient connected to nasal cannula oxygen Cardiovascular status: blood pressure returned to baseline and stable Postop Assessment: no apparent nausea or vomiting Anesthetic complications: no   There were no known notable events for this encounter.  Last Vitals:  Vitals:   07/23/24 1430 07/23/24 1501  BP: 127/77 127/79  Pulse: 90 92  Resp: 16 16  Temp: 37.2 C 36.9 C  SpO2: 92% 99%    Last Pain:  Vitals:   07/23/24 1501  TempSrc: Oral  PainSc:                  Churchill Grimsley L Sila Sarsfield

## 2024-07-23 NOTE — Anesthesia Procedure Notes (Signed)
 Anesthesia Regional Block: Adductor canal block   Pre-Anesthetic Checklist: , timeout performed,  Correct Patient, Correct Site, Correct Laterality,  Correct Procedure, Correct Position, site marked,  Risks and benefits discussed,  Pre-op evaluation,  At surgeon's request and post-op pain management  Laterality: Right  Prep: Maximum Sterile Barrier Precautions used, chloraprep       Needles:  Injection technique: Single-shot  Needle Type: Echogenic Stimulator Needle     Needle Length: 9cm  Needle Gauge: 21     Additional Needles:   Procedures:,,,, ultrasound used (permanent image in chart),,    Narrative:  Start time: 07/23/2024 12:09 PM End time: 07/23/2024 12:13 PM Injection made incrementally with aspirations every 5 mL. Anesthesiologist: Niels Marien CROME, MD

## 2024-07-23 NOTE — Anesthesia Preprocedure Evaluation (Addendum)
 Anesthesia Evaluation  Patient identified by MRN, date of birth, ID band Patient awake    Reviewed: Allergy & Precautions, NPO status , Patient's Chart, lab work & pertinent test results  Airway Mallampati: II  TM Distance: >3 FB Neck ROM: Full    Dental  (+) Poor Dentition, Dental Advisory Given, Missing,    Pulmonary neg pulmonary ROS   Pulmonary exam normal breath sounds clear to auscultation       Cardiovascular hypertension, Pt. on medications Normal cardiovascular exam Rhythm:Regular Rate:Normal     Neuro/Psych negative neurological ROS  negative psych ROS   GI/Hepatic negative GI ROS, Neg liver ROS,,,  Endo/Other  diabetes, Type 2, Insulin  Dependent    Renal/GU Renal InsufficiencyRenal diseaseLab Results      Component                Value               Date                      NA                       134 (L)             07/23/2024                CL                       105                 07/23/2024                K                        3.5                 07/23/2024                CO2                      20 (L)              07/23/2024                BUN                      17                  07/23/2024                CREATININE               1.49 (H)            07/23/2024                GFRNONAA                 46 (L)              07/23/2024                CALCIUM                   7.9 (L)             07/23/2024                PHOS  3.1                 07/23/2024                ALBUMIN                  <1.5 (L)            07/23/2024                GLUCOSE                  183 (H)             07/23/2024             negative genitourinary   Musculoskeletal negative musculoskeletal ROS (+)    Abdominal   Peds  Hematology  (+) Blood dyscrasia, anemia Lab Results      Component                Value               Date                      WBC                      17.0 (H)             07/23/2024                HGB                      9.4 (L)             07/23/2024                HCT                      29.4 (L)            07/23/2024                MCV                      88.8                07/23/2024                PLT                      624 (H)             07/23/2024              Anesthesia Other Findings   Reproductive/Obstetrics                              Anesthesia Physical Anesthesia Plan  ASA: 3  Anesthesia Plan: General   Post-op Pain Management: Dilaudid IV, Ketamine IV* and Tylenol  PO (pre-op)*   Induction: Intravenous  PONV Risk Score and Plan: 3 and Midazolam, Dexamethasone and Ondansetron   Airway Management Planned: Oral ETT  Additional Equipment:   Intra-op Plan:   Post-operative Plan: Extubation in OR  Informed Consent: I have reviewed the patients History and Physical, chart, labs and discussed the procedure including the risks, benefits and alternatives for the proposed anesthesia with the patient or authorized representative who has indicated his/her understanding  and acceptance.     Dental advisory given  Plan Discussed with: CRNA  Anesthesia Plan Comments:          Anesthesia Quick Evaluation

## 2024-07-23 NOTE — Progress Notes (Signed)
 PROGRESS NOTE  [name] FMW:[id number] DOB: 12-Apr-1986 DOA: 07/19/2024 PCP: [name] [name], NP   LOS: 3 days   Brief Narrative / Interim history: 38 y.o. female with PMH of IDDM-2, HTN and Rt BKA wound presenting with increasing right BKA swelling and purulent drainage, and admitted with working diagnosis of sepsis due to right BKA cellulitis/osteomyelitis.  Imaging with MRI showed large complex fluid collection containing multiple air bubbles within soft tissue of the distal stump suspicious for abscess and underlying gas-forming infection, possible early distal tibial osteomyelitis, possible patellar bursitis.  Orthopedic surgery consulted  Subjective / 24h Interval events: She is doing well this morning, awaiting surgery.  Assesement and Plan: Principal problem Sepsis due to right BKA osteomyelitis and abscess -met sepsis criteria on admission with fever, white count, source.  Has been placed on antibiotics, continue.  Orthopedic surgery consulted, she will be taken to the OR today.  Blood cultures are without growth  Active problems DM 2, poorly controlled, with hyperglycemia-continue glargine, sliding scale  Nausea, vomiting-possible gastroparesis.  Improved  AKI-creatinine improving, I wonder whether she has a degree of chronic kidney disease  Hypokalemia-replenish and continue to monitor  Essential hypertension-continue Procardia   Elevated LFTs-resolved  Obesity, class I-BMI 33  Scheduled Meds:  chlorhexidine       [MAR Hold] enoxaparin  (LOVENOX ) injection  40 mg Subcutaneous Q24H   fentaNYL        influenza vac split trivalent PF  0.5 mL Intramuscular Tomorrow-1000   [MAR Hold] insulin  aspart  0-15 Units Subcutaneous TID WC   [MAR Hold] insulin  aspart  0-5 Units Subcutaneous QHS   [MAR Hold] insulin  aspart  7 Units Subcutaneous TID WC   [MAR Hold] insulin  glargine-yfgn  35 Units Subcutaneous QHS   [MAR Hold] metoCLOPramide  (REGLAN ) injection  5 mg  Intravenous TID AC & HS   midazolam       [MAR Hold] NIFEdipine   30 mg Oral Daily   [MAR Hold] pneumococcal 20-valent conjugate vaccine  0.5 mL Intramuscular Tomorrow-1000   Continuous Infusions:  [MAR Hold] ceFEPime (MAXIPIME) IV     lactated ringers      [MAR Hold] linezolid (ZYVOX) IV 600 mg (07/23/24 1025)   [MAR Hold] metronidazole  500 mg (07/23/24 0551)   PRN Meds:.[MAR Hold] acetaminophen , chlorhexidine, fentaNYL , [MAR Hold]  HYDROmorphone (DILAUDID) injection, insulin  aspart, [MAR Hold] loperamide, midazolam, [MAR Hold] ondansetron  (ZOFRAN ) IV, [MAR Hold] oxyCODONE   Current Outpatient Medications  Medication Instructions   acetaminophen  (TYLENOL ) 1,000 mg, Every 6 hours PRN   azithromycin  (ZITHROMAX ) 250 mg, Oral, As directed   Lantus  SoloStar 15 Units, Subcutaneous, Daily at bedtime, Patient is taking only 10 unit   NIFEdipine  (ADALAT  CC) 30 mg, Oral, Daily   NovoLOG  FlexPen 5 Units, Subcutaneous, 3 times daily with meals, Also can use sliding scale if needed. Patient is taking 3 U at dinner and 5 unit Breakfast and lunch   ondansetron  (ZOFRAN -ODT) 4 mg, Oral, Every 6 hours PRN    Diet Orders (From admission, onward)     Start     Ordered   07/23/24 0908  Diet NPO time specified  Diet effective now        07/23/24 0908            DVT prophylaxis: enoxaparin  (LOVENOX ) injection 40 mg Start: 07/20/24 1130   Lab Results  Component Value Date   PLT 624 (H) 07/23/2024      Code Status: Full Code  Family Communication: No family at bedside  Status is: Inpatient  Remains inpatient appropriate because: OR today   Level of care: Telemetry Surgical  Consultants:  Orthopedic surgery  Objective: Vitals:   07/22/24 1946 07/23/24 0439 07/23/24 0805 07/23/24 0805  BP: (!) 157/82 136/81 (!) 144/84 (!) 144/84  Pulse: (!) 106 97 90 90  Resp: 15 17 17    Temp: 99.9 F (37.7 C) 98.6 F (37 C) 98 F (36.7 C)   TempSrc: Oral Oral Oral   SpO2: 99% 98% 98%   Weight:       Height:        Intake/Output Summary (Last 24 hours) at 07/23/2024 1157 Last data filed at 07/22/2024 1746 Gross per 24 hour  Intake 852.07 ml  Output --  Net 852.07 ml   Wt Readings from Last 3 Encounters:  07/20/24 97.1 kg  03/15/23 86.6 kg  03/13/23 86.6 kg    Examination:  Constitutional: NAD Eyes: no scleral icterus ENMT: Mucous membranes are moist.  Neck: normal, supple Respiratory: clear to auscultation bilaterally, no wheezing, no crackles. Cardiovascular: Regular rate and rhythm, no murmurs / rubs / gallops. No LE edema.  Abdomen: non distended, no tenderness. Bowel sounds positive.  Musculoskeletal: no clubbing / cyanosis.    Data Reviewed: I have independently reviewed following labs and imaging studies   CBC Recent Labs  Lab 07/19/24 2022 07/20/24 0650 07/21/24 0640 07/22/24 1011 07/23/24 0537  WBC 16.0* 13.5* 13.0* 18.8* 17.0*  HGB 10.4* 9.3* 9.3* 10.0* 9.4*  HCT 31.9* 28.3* 28.4* 30.7* 29.4*  PLT 469* 419* 447* 594* 624*  MCV 87.6 87.6 87.7 88.0 88.8  MCH 28.6 28.8 28.7 28.7 28.4  MCHC 32.6 32.9 32.7 32.6 32.0  RDW 12.8 12.8 13.0 13.1 12.9    Recent Labs  Lab 07/19/24 2021 07/19/24 2022 07/20/24 0650 07/20/24 1216 07/21/24 0640 07/22/24 1011 07/23/24 0537  NA  --  131* 134*  --  131* 133* 134*  K  --  3.4* 3.5  --  4.1 3.7 3.5  CL  --  95* 98  --  97* 102 105  CO2  --  24 25  --  24 21* 20*  GLUCOSE  --  154* 183*  --  219* 144* 183*  BUN  --  20 21*  --  28* 22* 17  CREATININE  --  1.47* 1.49*  --  2.25* 1.73* 1.49*  CALCIUM   --  8.3* 7.9*  --  7.9* 8.0* 7.9*  AST  --  90* 31  --  32  --   --   ALT  --  62* 45*  --  34  --   --   ALKPHOS  --  157* 125  --  139*  --   --   BILITOT  --  0.8 0.9  --  0.8  --   --   ALBUMIN  --  1.6* <1.5*  --  <1.5* <1.5* <1.5*  MG  --   --  1.9  --  1.8 2.1 2.0  CRP  --   --   --  31.2* 23.1*  --   --   LATICACIDVEN 0.9  --   --   --   --   --   --   HGBA1C  --   --   --  15.2*  --   --   --      ------------------------------------------------------------------------------------------------------------------ No results for input(s): CHOL, HDL, LDLCALC, TRIG, CHOLHDL, LDLDIRECT in the last 72 hours.  Lab Results  Component Value Date  HGBA1C 15.2 (H) 07/20/2024   ------------------------------------------------------------------------------------------------------------------ No results for input(s): TSH, T4TOTAL, T3FREE, THYROIDAB in the last 72 hours.  Invalid input(s): FREET3  Cardiac Enzymes No results for input(s): CKMB, TROPONINI, MYOGLOBIN in the last 168 hours.  Invalid input(s): CK ------------------------------------------------------------------------------------------------------------------ No results found for: BNP  CBG: Recent Labs  Lab 07/22/24 1142 07/22/24 1616 07/22/24 2117 07/23/24 0625 07/23/24 1142  GLUCAP 199* 81 220* 190* 156*    Recent Results (from the past 240 hours)  Blood culture (routine x 2)     Status: None (Preliminary result)   Collection Time: 07/19/24  8:23 PM   Specimen: BLOOD RIGHT HAND  Result Value Ref Range Status   Specimen Description BLOOD RIGHT HAND  Final   Special Requests   Final    BOTTLES DRAWN AEROBIC AND ANAEROBIC Blood Culture results may not be optimal due to an inadequate volume of blood received in culture bottles   Culture   Final    NO GROWTH 4 DAYS Performed at [facility], [address] [place], [place] [address]    Report Status PENDING  Incomplete  Blood culture (routine x 2)     Status: None (Preliminary result)   Collection Time: 07/19/24  8:23 PM   Specimen: BLOOD LEFT HAND  Result Value Ref Range Status   Specimen Description BLOOD LEFT HAND  Final   Special Requests   Final    BOTTLES DRAWN AEROBIC ONLY Blood Culture results may not be optimal due to an inadequate volume of blood received in culture bottles   Culture   Final    NO GROWTH 4  DAYS Performed at [facility], [address], [place], [place] [address]    Report Status PENDING  Incomplete  MRSA Next Gen by PCR, Nasal     Status: None   Collection Time: 07/23/24  5:58 AM   Specimen: Nasal Mucosa; Nasal Swab  Result Value Ref Range Status   MRSA by PCR Next Gen NOT DETECTED NOT DETECTED Final    Comment: (NOTE) The GeneXpert MRSA Assay (FDA approved for NASAL specimens only), is one component of a comprehensive MRSA colonization surveillance program. It is not intended to diagnose MRSA infection nor to guide or monitor treatment for MRSA infections. Test performance is not FDA approved in patients less than 53 years old. Performed at [facility], [address], [place], [place] [address]      Radiology Studies: No results found.   Nilda Fendt, MD, PhD [facility]  Between 7 am - 7 pm I am available, please contact me via Amion (for emergencies) or Securechat (non urgent messages)  Between 7 pm - 7 am I am not available, please contact night coverage MD/APP via Amion

## 2024-07-23 NOTE — Interval H&P Note (Signed)
 History and Physical Interval Note:  07/23/2024 6:37 AM  [name]  has presented today for surgery, with the diagnosis of Dehiscence Right Below Knee Amputation.  The various methods of treatment have been discussed with the patient and family. After consideration of risks, benefits and other options for treatment, the patient has consented to  Procedure(s): REVISION AMPUTATION, BELOW THE KNEE (Right) as a surgical intervention.  The patient's history has been reviewed, patient examined, no change in status, stable for surgery.  I have reviewed the patient's chart and labs.  Questions were answered to the patient's satisfaction.     [name]

## 2024-07-23 NOTE — Op Note (Signed)
 07/23/2024  1:52 PM  PATIENT:  [name]    PRE-OPERATIVE DIAGNOSIS:  Dehiscence Right Below Knee Amputation  POST-OPERATIVE DIAGNOSIS:  Same  PROCEDURE:  REVISION AMPUTATION, BELOW THE KNEE RIGHT Application Kerecis micro graft 38 cm. Application of peel in place wound VAC sponge.  SURGEON:  Jerona LULLA Sage, MD  PHYSICIAN ASSISTANT:None ANESTHESIA:   General  PREOPERATIVE INDICATIONS:  [name] is a  38 y.o. female with a diagnosis of Dehiscence Right Below Knee Amputation who failed conservative measures and elected for surgical management.    The risks benefits and alternatives were discussed with the patient preoperatively including but not limited to the risks of infection, bleeding, nerve injury, cardiopulmonary complications, the need for revision surgery, among others, and the patient was willing to proceed.  OPERATIVE IMPLANTS:   Implant Name Type Inv. Item Serial No. Manufacturer Lot No. LRB No. Used Action  GRAFT SKIN WND MICRO 38 - [id number] Tissue GRAFT SKIN WND MICRO 38  [employer] [id number] Right 1 Implanted    @ENCIMAGES @  OPERATIVE FINDINGS: Patient had a large abscess this tissue was sent for cultures.  OPERATIVE PROCEDURE: Patient was brought the operating room and underwent a general anesthetic after regional block.  After adequate levels anesthesia were obtained patient's right lower extremity was prepped using DuraPrep draped into a sterile field a timeout was called.  A fishmouth incision was made around the ulcerative tissue.  There was a large abscess that extended down to the tibia.  The distal 2 cm of the tibia was resected.  The abscess tissue was sent for cultures.  The wound was irrigated with Vashe x 2 and electrocautery was used for hemostasis.  The Kerecis micro graft 38 cm was mixed with 1 g vancomycin powder and 1.2 g of tobramycin powder.  This was used for soft tissue reinforcement.  The incision was closed using 2-0 nylon a Prevena  peel in place wound VAC was applied this had a good suction fit patient was extubated taken the PACU in stable condition.   DISCHARGE PLANNING:  Antibiotic duration: Would continue IV antibiotics and base continue antibiotic coverage on culture sensitivities.  Weightbearing: Nonweightbearing on the right  Pain medication: Opioid pathway  Dressing care/ Wound VAC: Continue wound VAC for 1 week  Ambulatory devices: Walker  Discharge to: Anticipate discharge to home patient is quite independent with her amputation.  Follow-up: In the office 1 week post operative.

## 2024-07-23 NOTE — Transfer of Care (Signed)
 Immediate Anesthesia Transfer of Care Note  Patient: [name]  Procedure(s) Performed: REVISION AMPUTATION, BELOW THE KNEE RIGHT (Right: Knee)  Patient Location: PACU  Anesthesia Type:General  Level of Consciousness: awake, alert , and oriented  Airway & Oxygen Therapy: Patient Spontanous Breathing  Post-op Assessment: Report given to RN and Post -op Vital signs reviewed and stable  Post vital signs: Reviewed and stable  Last Vitals:  Vitals Value Taken Time  BP 144/88 07/23/24 14:04  Temp    Pulse 97 07/23/24 14:07  Resp 23 07/23/24 14:07  SpO2 97 % 07/23/24 14:07  Vitals shown include unfiled device data.  Last Pain:  Vitals:   07/23/24 1210  TempSrc:   PainSc: 0-No pain         Complications: There were no known notable events for this encounter.

## 2024-07-23 NOTE — Anesthesia Procedure Notes (Signed)
 Anesthesia Regional Block: Popliteal block   Pre-Anesthetic Checklist: , timeout performed,  Correct Patient, Correct Site, Correct Laterality,  Correct Procedure, Correct Position, site marked,  Risks and benefits discussed,  Pre-op evaluation,  At surgeon's request and post-op pain management  Laterality: Right  Prep: Maximum Sterile Barrier Precautions used, chloraprep       Needles:  Injection technique: Single-shot  Needle Type: Echogenic Stimulator Needle     Needle Length: 9cm  Needle Gauge: 21     Additional Needles:   Procedures:,,,, ultrasound used (permanent image in chart),,    Narrative:  Start time: 07/23/2024 12:05 PM End time: 07/23/2024 12:09 PM Injection made incrementally with aspirations every 5 mL. Anesthesiologist: Niels Marien CROME, MD

## 2024-07-23 NOTE — Plan of Care (Signed)
  Problem: Coping: Goal: Ability to adjust to condition or change in health will improve Outcome: Progressing   Problem: Fluid Volume: Goal: Ability to maintain a balanced intake and output will improve Outcome: Progressing   Problem: Health Behavior/Discharge Planning: Goal: Ability to manage health-related needs will improve Outcome: Progressing

## 2024-07-23 NOTE — TOC Initial Note (Signed)
 Transition of Care [facility]) - Initial/Assessment Note    Patient Details  Name: [name] MRN: [id number] Date of Birth: 1986/05/17  [facility] CM/SW Contact:    Rosalva Jon Bloch, RN Phone Number: 07/23/2024, 9:43 AM  Clinical Narrative:    Presents with cellulitis and purulent drainage right transtibial amputation, s/p BKA Jan. 2023 and S/P revision May 2023.                Plan: right revision BKA, 10/22   Pt is from home with 5 y/o son. Supportive aunt 7 min away from her home.  Pt states has no problems getting  RX meds or transportation issues. PCP confirmed,  Nena Deitra Saltness Thompson,NP.   IP CM following and will assist with needs....  Expected Discharge Plan: Home/Self Care Barriers to Discharge: Continued Medical Work up   Patient Goals and CMS Choice            Expected Discharge Plan and Services   Discharge Planning Services: CM Consult   Living arrangements for the past 2 months: Single Family Home                                      Prior Living Arrangements/Services Living arrangements for the past 2 months: Single Family Home Lives with:: Minor Children (5 yr old son) Patient language and need for interpreter reviewed:: Yes Do you feel safe going back to the place where you live?: Yes      Need for Family Participation in Patient Care: Yes (Comment) Care giver support system in place?: Yes (comment) Current home services: DME (cane, RW, W/C, prothesis) Criminal Activity/Legal Involvement Pertinent to Current Situation/Hospitalization: No - Comment as needed  Activities of Daily Living   ADL Screening (condition at time of admission) Independently performs ADLs?: Yes (appropriate for developmental age) Is the patient deaf or have difficulty hearing?: No Does the patient have difficulty seeing, even when wearing glasses/contacts?: No Does the patient have difficulty concentrating, remembering, or making decisions?:  No  Permission Sought/Granted                  Emotional Assessment       Orientation: : Oriented to Self, Oriented to Place, Oriented to  Time, Oriented to Situation   Psych Involvement: No (comment)  Admission diagnosis:  AKI (acute kidney injury) [N17.9] BKA stump complication (HCC) [T87.9] Osteomyelitis of left tibia, unspecified type (HCC) [M86.9] Patient Active Problem List   Diagnosis Date Noted   BKA stump complication (HCC) 07/20/2024   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 (HCC) 07/20/2024   Community acquired pneumonia of right upper lobe of lung 03/17/2023   Proteinuria due to type 2 diabetes mellitus (HCC) 03/15/2023   Nausea & vomiting 03/15/2023   Lobar pneumonia 03/15/2023   Long-term (current) use of injectable non-insulin  antidiabetic drugs 03/13/2023   Diabetes mellitus (HCC) 03/13/2023   High triglycerides 08/08/2022   Essential hypertension 01/21/2022   Insulin  dependent type 2 diabetes mellitus (HCC) 01/21/2022   Chronic pain of right knee 01/13/2022   Nausea 01/13/2022   Type 2 diabetes mellitus with hyperglycemia (HCC) 11/30/2021   Hypokalemia 10/02/2021   Hypoalbuminemia 09/26/2021   Positive for macroalbuminuria 09/26/2021   Acute blood loss anemia 09/22/2021   ATN (acute tubular necrosis) 09/22/2021   AKI (acute kidney injury) 09/20/2021   Hyponatremia 09/19/2021   Diabetes mellitus type 2,  noninsulin dependent (HCC) 09/19/2021   Chronic hypertension 12/16/2020   Missed abortion 12/16/2020   Status post cesarean delivery 12/15/2020   Establishing care with new doctor, encounter for 12/22/2018   Pap smear abnormality of cervix/human papillomavirus (HPV) positive 06/25/2018   BMI 33.0-33.9,adult 03/30/2016   PCP:  [name] [name], NP Pharmacy:   [facility] [address], [place] [address] [place] [address] [place] [address] [place] [address] Phone: [phone] Fax: [phone]     Social Drivers  of Health (SDOH) Social History: SDOH Screenings   Food Insecurity: No Food Insecurity (07/20/2024)  Housing: Low Risk  (07/20/2024)  Transportation Needs: No Transportation Needs (07/20/2024)  Utilities: Not At Risk (07/20/2024)  Alcohol Screen: Low Risk  (03/16/2023)  Depression (PHQ2-9): Low Risk  (03/16/2023)  Financial Resource Strain: Low Risk  (03/16/2023)  Physical Activity: Insufficiently Active (03/16/2023)  Social Connections: Moderately Integrated (07/20/2024)  Stress: No Stress Concern Present (03/16/2023)  Tobacco Use: Low Risk  (07/19/2024)   SDOH Interventions:     Readmission Risk Interventions     No data to display

## 2024-07-24 ENCOUNTER — Encounter (HOSPITAL_COMMUNITY): Payer: Self-pay | Admitting: Orthopedic Surgery

## 2024-07-24 ENCOUNTER — Other Ambulatory Visit: Payer: Self-pay

## 2024-07-24 DIAGNOSIS — A491 Streptococcal infection, unspecified site: Secondary | ICD-10-CM

## 2024-07-24 DIAGNOSIS — Z89511 Acquired absence of right leg below knee: Secondary | ICD-10-CM

## 2024-07-24 DIAGNOSIS — M869 Osteomyelitis, unspecified: Secondary | ICD-10-CM

## 2024-07-24 DIAGNOSIS — Z8619 Personal history of other infectious and parasitic diseases: Secondary | ICD-10-CM

## 2024-07-24 DIAGNOSIS — L02415 Cutaneous abscess of right lower limb: Secondary | ICD-10-CM

## 2024-07-24 DIAGNOSIS — T879 Unspecified complications of amputation stump: Secondary | ICD-10-CM | POA: Diagnosis not present

## 2024-07-24 DIAGNOSIS — M86461 Chronic osteomyelitis with draining sinus, right tibia and fibula: Secondary | ICD-10-CM | POA: Diagnosis not present

## 2024-07-24 LAB — CBC
HCT: 23.6 % — ABNORMAL LOW (ref 36.0–46.0)
Hemoglobin: 7.7 g/dL — ABNORMAL LOW (ref 12.0–15.0)
MCH: 28.4 pg (ref 26.0–34.0)
MCHC: 32.6 g/dL (ref 30.0–36.0)
MCV: 87.1 fL (ref 80.0–100.0)
Platelets: 674 K/uL — ABNORMAL HIGH (ref 150–400)
RBC: 2.71 MIL/uL — ABNORMAL LOW (ref 3.87–5.11)
RDW: 13 % (ref 11.5–15.5)
WBC: 21.3 K/uL — ABNORMAL HIGH (ref 4.0–10.5)
nRBC: 0 % (ref 0.0–0.2)

## 2024-07-24 LAB — GLUCOSE, CAPILLARY
Glucose-Capillary: 301 mg/dL — ABNORMAL HIGH (ref 70–99)
Glucose-Capillary: 281 mg/dL — ABNORMAL HIGH (ref 70–99)
Glucose-Capillary: 239 mg/dL — ABNORMAL HIGH (ref 70–99)
Glucose-Capillary: 276 mg/dL — ABNORMAL HIGH (ref 70–99)
Glucose-Capillary: 215 mg/dL — ABNORMAL HIGH (ref 70–99)

## 2024-07-24 LAB — COMPREHENSIVE METABOLIC PANEL WITH GFR
ALT: 25 U/L (ref 0–44)
AST: 17 U/L (ref 15–41)
Albumin: <1.5 g/dL — ABNORMAL LOW (ref 3.5–5.0)
Alkaline Phosphatase: 150 U/L — ABNORMAL HIGH (ref 38–126)
Anion gap: 11 (ref 5–15)
BUN: 28 mg/dL — ABNORMAL HIGH (ref 6–20)
CO2: 20 mmol/L — ABNORMAL LOW (ref 22–32)
Calcium: 7.6 mg/dL — ABNORMAL LOW (ref 8.9–10.3)
Chloride: 100 mmol/L (ref 98–111)
Creatinine, Ser: 1.73 mg/dL — ABNORMAL HIGH (ref 0.44–1.00)
GFR, Estimated: 39 mL/min — ABNORMAL LOW (ref >60)
Glucose, Bld: 373 mg/dL — ABNORMAL HIGH (ref 70–99)
Potassium: 4 mmol/L (ref 3.5–5.1)
Sodium: 131 mmol/L — ABNORMAL LOW (ref 135–145)
Total Bilirubin: 0.4 mg/dL (ref 0.0–1.2)
Total Protein: 6.5 g/dL (ref 6.5–8.1)

## 2024-07-24 LAB — GLUCOSE, RANDOM: Glucose, Bld: 458 mg/dL — ABNORMAL HIGH (ref 70–99)

## 2024-07-24 LAB — CULTURE, BLOOD (ROUTINE X 2)
Culture: NO GROWTH
Culture: NO GROWTH

## 2024-07-24 LAB — PHOSPHORUS: Phosphorus: 3.5 mg/dL (ref 2.5–4.6)

## 2024-07-24 LAB — CK: Total CK: 21 U/L — ABNORMAL LOW (ref 38–234)

## 2024-07-24 LAB — MAGNESIUM: Magnesium: 2 mg/dL (ref 1.7–2.4)

## 2024-07-24 MED ORDER — SODIUM CHLORIDE 0.9% FLUSH
10.0000 mL | Freq: Two times a day (BID) | INTRAVENOUS | Status: DC
  Administered 2024-07-24 – 2024-07-25 (×3): 10 mL
  Administered 2024-07-26: 20 mL
  Administered 2024-07-27: 10 mL

## 2024-07-24 MED ORDER — SODIUM CHLORIDE 0.9 % IV SOLN
8.0000 mg/kg | Freq: Every day | INTRAVENOUS | Status: DC
  Filled 2024-07-24: qty 12

## 2024-07-24 MED ORDER — CEFAZOLIN SODIUM-DEXTROSE 2-4 GM/100ML-% IV SOLN
2.0000 g | Freq: Three times a day (TID) | INTRAVENOUS | Status: DC
  Administered 2024-07-25 – 2024-07-27 (×6): 2 g via INTRAVENOUS
  Filled 2024-07-24 (×5): qty 100

## 2024-07-24 MED ORDER — INSULIN GLARGINE-YFGN 100 UNIT/ML ~~LOC~~ SOLN
40.0000 [IU] | Freq: Every day | SUBCUTANEOUS | Status: DC
  Administered 2024-07-24: 40 [IU] via SUBCUTANEOUS
  Filled 2024-07-24 (×2): qty 0.4

## 2024-07-24 MED ORDER — SODIUM CHLORIDE 0.9% FLUSH
10.0000 mL | INTRAVENOUS | Status: DC | PRN
  Administered 2024-07-27: 10 mL

## 2024-07-24 MED ORDER — DAPTOMYCIN-SODIUM CHLORIDE 700-0.9 MG/100ML-% IV SOLN
700.0000 mg | Freq: Every day | INTRAVENOUS | Status: DC
  Administered 2024-07-24: 700 mg via INTRAVENOUS
  Filled 2024-07-24: qty 100

## 2024-07-24 NOTE — Progress Notes (Signed)
 Peripherally Inserted Central Catheter Placement  The IV Nurse has discussed with the patient and/or persons authorized to consent for the patient, the purpose of this procedure and the potential benefits and risks involved with this procedure.  The benefits include less needle sticks, lab draws from the catheter, and the patient may be discharged home with the catheter. Risks include, but not limited to, infection, bleeding, blood clot (thrombus formation), and puncture of an artery; nerve damage and irregular heartbeat and possibility to perform a PICC exchange if needed/ordered by physician.  Alternatives to this procedure were also discussed.  Bard Power PICC patient education guide, fact sheet on infection prevention and patient information card has been provided to patient /or left at bedside.    PICC Placement Documentation  PICC Single Lumen 07/24/24 Right Brachial 37 cm 0 cm (Active)  Indication for Insertion or Continuance of Line Home intravenous therapies (PICC only) 07/24/24 1700  Exposed Catheter (cm) 0 cm 07/24/24 1700  Site Assessment Clean, Dry, Intact 07/24/24 1700  Line Status Flushed;Saline locked;Blood return noted 07/24/24 1700  Dressing Type Transparent;Securing device 07/24/24 1700  Dressing Status Antimicrobial disc/dressing in place;Clean, Dry, Intact 07/24/24 1700  Line Care Connections checked and tightened 07/24/24 1700  Line Adjustment (NICU/IV Team Only) No 07/24/24 1700  Dressing Intervention New dressing;Adhesive placed at insertion site (IV team only) 07/24/24 1700  Dressing Change Due 07/31/24 07/24/24 1700       Ethyl Adonna Fuller 07/24/2024, 5:33 PM

## 2024-07-24 NOTE — Consult Note (Addendum)
 [facility]    Date of Admission:  07/19/2024     Total days of antibiotics 6               Reason for Consult: BKA Stump infection   Referring Provider: Dr. Trixie Primary Care Provider: [name] [name], [name]   ASSESSMENT:  [name] is a 38 y/o AA female with history of right BKA secondary to necotizing fasciitis presenting with right stump wound and fever and found to have abscess and possible osteomyelitis. S/p debridement with wound vac placement and cultures showing gram positive cocci on gram stain and cultures pending.   [name]'s blood cultures are without growth to date and is POD #1. Discussed plan of care and will change linezolid to daptomycin. Initiate therapeutic drug monitoring of CK levels. Continue to monitor cultures and adjust antibiotics as appropriate. Anticipate extended course of antibiotics with route and duration to be determined based on culture results. Continue post-operative wound care Orthopedics. Standard/Universal precautions. Remaining medical and supportive care per Internal Medicine.   PLAN:  Change antibiotics to Daptomycin.  Therapeutic drug monitoring of CK levels.  Post-operative wound care per Orthopedics.  Monitor cultures for organisms and adjust antibiotics as appropriate.  Standard / universal precautions. Remaining medical and supportive care per Internal Medicine.    Principal Problem:   BKA stump complication (HCC) Active Problems:   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 (HCC)    influenza vac split trivalent PF  0.5 mL Intramuscular Tomorrow-1000   insulin  aspart  0-15 Units Subcutaneous TID WC   insulin  aspart  0-5 Units Subcutaneous QHS   insulin  aspart  7 Units Subcutaneous TID WC   insulin  glargine-yfgn  40 Units Subcutaneous QHS   metoCLOPramide  (REGLAN ) injection  5 mg Intravenous TID AC & HS   NIFEdipine   30 mg Oral Daily   pneumococcal 20-valent  conjugate vaccine  0.5 mL Intramuscular Tomorrow-1000     HPI: [name] is a 38 y.o. female with previous medical history of hypertension and Type 2 diabetes presenting to the hospital with fever and right stump wound.   [name] initially sustained an injury to her right foot in December 2022 when she stepped on a sewing needle that developed necrotizing fasciitis ultimately requiring a right below knee amputation with cultures growing MSSA and Enterobacter cloacae. Per records also grew MSSA at an outside hospital.   [name] presented to the ED with approximately 1 month history of right leg wound with swelling and acute onset fever.  Initially with elevated temperature 100.4 F and leukocytosis of 13,500.  Started on broad-spectrum coverage with linezolid and piperacillin-tazobactam.  Right knee x-ray with soft tissue infection with gas-forming organism in the below-knee amputation stump and osteomyelitis of the distal tibia.  MRI right tibia/fibula with large complex fluid collection containing multiple air bubbles within the soft tissues of the distal stump suspicious for abscess and underlying gas-forming infection.  Also noted to have low level marrow hyperintensity with distal tibia suspicious for early osteomyelitis.  Evaluated by orthopedics and taken to the OR for dehiscence of right below-knee amputation with findings of large abscess.  [name] is POD #1 with surgical specimen showing gram positive cocci on gram stain and culture reincubated for better growth. Blood cultures without growth to date. Wound vac in place and no additional surgical intervention planned at this time. Tolerating antibiotics with no adverse side effects.    Review of  Systems: Review of Systems  Constitutional:  Negative for chills, fever and weight loss.  Respiratory:  Negative for cough, shortness of breath and wheezing.   Cardiovascular:  Negative for chest pain and leg swelling.   Gastrointestinal:  Negative for abdominal pain, constipation, diarrhea, nausea and vomiting.  Skin:  Negative for rash.     Past Medical History:  Diagnosis Date   Chronic hypertension    12-16-2020  documented in epic since 2017 pt dx hypertension (not during pregnancy);  pt currently is not and has not been taking any bp med,  bp at [facility] 12-15-2020 165/ 104   Missed ab 12/15/2020   Type 2 diabetes mellitus (HCC)    12-16-2020  pt stated only during pregnancy took medication, at [facility] 12-15-2020 blood glucose 353 pt was given metformin  bid and pt stated did start taking it   Wears glasses     Social History   Tobacco Use   Smoking status: Never   Smokeless tobacco: Never  Vaping Use   Vaping status: Never Used  Substance Use Topics   Alcohol use: No   Drug use: Never    Family History  Problem Relation Age of Onset   Diabetes Mother    Hypertension Mother    Hypertension Maternal Grandmother    Diabetes Maternal Aunt    Diabetes Maternal Uncle     Allergies  Allergen Reactions   Almond (Diagnostic) Hives   Almond Oil Hives   Bee Venom Hives   Limonene Hives and Nausea And Vomiting   Chlorhexidine Rash    Unable to use CHG wipes    OBJECTIVE: Blood pressure 137/82, pulse 94, temperature 98.7 F (37.1 C), temperature source Oral, resp. rate 16, height 5' 7 (1.702 m), weight 97.1 kg, last menstrual period 06/02/2024, SpO2 99%.  Physical Exam Constitutional:      General: She is not in acute distress.    Appearance: She is well-developed.     Comments: Lying in bed with head of bed elevated; pleasant.   Cardiovascular:     Rate and Rhythm: Normal rate and regular rhythm.     Heart sounds: Normal heart sounds.  Pulmonary:     Effort: Pulmonary effort is normal.     Breath sounds: Normal breath sounds.  Musculoskeletal:     Comments: Wound vac with bloody drainage.  Skin:    General: Skin is warm and dry.  Neurological:     Mental Status: She is alert and  oriented to person, place, and time.     Lab Results Lab Results  Component Value Date   WBC 21.3 (H) 07/24/2024   HGB 7.7 (L) 07/24/2024   HCT 23.6 (L) 07/24/2024   MCV 87.1 07/24/2024   PLT 674 (H) 07/24/2024    Lab Results  Component Value Date   CREATININE 1.73 (H) 07/24/2024   BUN 28 (H) 07/24/2024   NA 131 (L) 07/24/2024   K 4.0 07/24/2024   CL 100 07/24/2024   CO2 20 (L) 07/24/2024    Lab Results  Component Value Date   ALT 25 07/24/2024   AST 17 07/24/2024   ALKPHOS 150 (H) 07/24/2024   BILITOT 0.4 07/24/2024     Microbiology: Recent Results (from the past 240 hours)  Blood culture (routine x 2)     Status: None   Collection Time: 07/19/24  8:23 PM   Specimen: BLOOD RIGHT HAND  Result Value Ref Range Status   Specimen Description BLOOD RIGHT HAND  Final  Special Requests   Final    BOTTLES DRAWN AEROBIC AND ANAEROBIC Blood Culture results may not be optimal due to an inadequate volume of blood received in culture bottles   Culture   Final    NO GROWTH 5 DAYS Performed at [facility], [address] [place], [place] [address]    Report Status 07/24/2024 FINAL  Final  Blood culture (routine x 2)     Status: None   Collection Time: 07/19/24  8:23 PM   Specimen: BLOOD LEFT HAND  Result Value Ref Range Status   Specimen Description BLOOD LEFT HAND  Final   Special Requests   Final    BOTTLES DRAWN AEROBIC ONLY Blood Culture results may not be optimal due to an inadequate volume of blood received in culture bottles   Culture   Final    NO GROWTH 5 DAYS Performed at [facility], [address] [place], [place] [address]    Report Status 07/24/2024 FINAL  Final  MRSA Next Gen by PCR, Nasal     Status: None   Collection Time: 07/23/24  5:58 AM   Specimen: Nasal Mucosa; Nasal Swab  Result Value Ref Range Status   MRSA by PCR Next Gen NOT DETECTED NOT DETECTED Final    Comment: (NOTE) The GeneXpert MRSA Assay (FDA approved for NASAL  specimens only), is one component of a comprehensive MRSA colonization surveillance program. It is not intended to diagnose MRSA infection nor to guide or monitor treatment for MRSA infections. Test performance is not FDA approved in patients less than 28 years old. Performed at [facility], [address], [place], [place] [address]   Aerobic/Anaerobic Culture w Gram Stain (surgical/deep wound)     Status: None (Preliminary result)   Collection Time: 07/23/24  1:22 PM   Specimen: Soft Tissue, Other  Result Value Ref Range Status   Specimen Description TISSUE  Final   Special Requests ABSCESS RIGHT BELOW KNEE AMPUTATION  Final   Gram Stain   Final    FEW WBC PRESENT, PREDOMINANTLY PMN RARE GRAM POSITIVE COCCI    Culture   Final    CULTURE REINCUBATED FOR BETTER GROWTH Performed at [facility], [address] [place], [place] [address]    Report Status PENDING  Incomplete   I personally spent a total of 32 minutes in the care of the patient today including preparing to see the patient, getting/reviewing separately obtained history, performing a medically appropriate exam/evaluation, counseling and educating, placing orders, referring and communicating with other health care professionals, documenting clinical information in the EHR, independently interpreting results, communicating results, and coordinating care.   Greg Johntavious Francom, [name] [facility]  07/24/2024  1:13 PM

## 2024-07-24 NOTE — Plan of Care (Signed)
  Problem: Education: Goal: Ability to describe self-care measures that may prevent or decrease complications (Diabetes Survival Skills Education) will improve Outcome: Progressing Goal: Individualized Educational Video(s) Outcome: Progressing   Problem: Nutritional: Goal: Maintenance of adequate nutrition will improve Outcome: Progressing Goal: Progress toward achieving an optimal weight will improve Outcome: Progressing   Problem: Activity: Goal: Risk for activity intolerance will decrease Outcome: Progressing   Problem: Pain Managment: Goal: General experience of comfort will improve and/or be controlled Outcome: Progressing

## 2024-07-24 NOTE — Progress Notes (Signed)
 PHARMACY CONSULT NOTE FOR:  OUTPATIENT  PARENTERAL ANTIBIOTIC THERAPY (OPAT)  Indication: Osteomyelitis  Regimen: Cefazolin  IV 2g Q8H  End date: 08/20/2024  IV antibiotic discharge orders are pended. To discharging provider:  please sign these orders via discharge navigator,  Select New Orders & click on the button choice - Manage This Unsigned Work.    Thank you for allowing pharmacy to be a part of this patient's care.  Feliciano Close, PharmD PGY2 Infectious Diseases Pharmacy Resident  07/24/2024 4:10 PM

## 2024-07-24 NOTE — Progress Notes (Signed)
 PROGRESS NOTE  [name] FMW:[id number] DOB: 1986/06/27 DOA: 07/19/2024 PCP: [name] Allison Nena, NP   LOS: 4 days   Brief Narrative / Interim history: 38 y.o. female with PMH of IDDM-2, HTN and Rt BKA wound presenting with increasing right BKA swelling and purulent drainage, and admitted with working diagnosis of sepsis due to right BKA cellulitis/osteomyelitis.  Imaging with MRI showed large complex fluid collection containing multiple air bubbles within soft tissue of the distal stump suspicious for abscess and underlying gas-forming infection, possible early distal tibial osteomyelitis, possible patellar bursitis.  Orthopedic surgery consulted  Subjective / 24h Interval events: He is doing well, underwent surgery yesterday.  She has no chest pain, no fever or chills, no abdominal discomfort, no nausea or vomiting.  Assesement and Plan: Principal problem Sepsis due to right BKA osteomyelitis and abscess -met sepsis criteria on admission with fever, white count, source.  Has been placed on antibiotics, continue.  Orthopedic surgery consulted, she was taken to the OR 10/22 status post I&D/revision.  Blood cultures are without growth, intraoperative cultures are growing GPC's - ID consulted today, discussed with Dr. Lindia, appreciate input  Active problems DM 2, poorly controlled, with hyperglycemia-continue glargine, sliding scale.  Hyperglycemic, increase glargine today  CBG (last 3)  Recent Labs    07/23/24 2315 07/24/24 0600 07/24/24 0838  GLUCAP 420* 301* 281*   Nausea, vomiting-possible gastroparesis.  Improved  AKI-creatinine overall stable, strong suspicion that she may have a degree of CKD given her poorly controlled diabetes  Hypokalemia-replenish and continue to monitor, potassium normalized today  Acute blood loss anemia-postoperatively, hemoglobin down to 7.7.  No bleeding, continue to closely monitor  Leukocytosis-slightly worse postoperatively,  antibiotics per ID, monitor  Essential hypertension-continue Procardia   Elevated LFTs-resolved  Obesity, class I-BMI 33  Scheduled Meds:  influenza vac split trivalent PF  0.5 mL Intramuscular Tomorrow-1000   insulin  aspart  0-15 Units Subcutaneous TID WC   insulin  aspart  0-5 Units Subcutaneous QHS   insulin  aspart  7 Units Subcutaneous TID WC   insulin  glargine-yfgn  35 Units Subcutaneous QHS   metoCLOPramide  (REGLAN ) injection  5 mg Intravenous TID AC & HS   NIFEdipine   30 mg Oral Daily   pneumococcal 20-valent conjugate vaccine  0.5 mL Intramuscular Tomorrow-1000   Continuous Infusions:  DAPTOmycin     PRN Meds:.acetaminophen , HYDROmorphone (DILAUDID) injection, loperamide, ondansetron  (ZOFRAN ) IV, oxyCODONE   Current Outpatient Medications  Medication Instructions   acetaminophen  (TYLENOL ) 1,000 mg, Every 6 hours PRN   azithromycin  (ZITHROMAX ) 250 mg, Oral, As directed   Lantus  SoloStar 15 Units, Subcutaneous, Daily at bedtime, Patient is taking only 10 unit   NIFEdipine  (ADALAT  CC) 30 mg, Oral, Daily   NovoLOG  FlexPen 5 Units, Subcutaneous, 3 times daily with meals, Also can use sliding scale if needed. Patient is taking 3 U at dinner and 5 unit Breakfast and lunch   ondansetron  (ZOFRAN -ODT) 4 mg, Oral, Every 6 hours PRN    Diet Orders (From admission, onward)     Start     Ordered   07/23/24 1504  Diet Carb Modified Fluid consistency: Thin; Room service appropriate? Yes  Diet effective now       Question Answer Comment  Diet-HS Snack? Nothing   Calorie Level Medium 1600-2000   Fluid consistency: Thin   Room service appropriate? Yes      07/23/24 1503            DVT prophylaxis:    Lab Results  Component Value Date  PLT 674 (H) 07/24/2024      Code Status: Full Code  Family Communication: No family at bedside  Status is: Inpatient Remains inpatient appropriate because: Awaiting intraoperative cultures   Level of care: Telemetry  Surgical  Consultants:  Orthopedic surgery  Objective: Vitals:   07/23/24 2022 07/23/24 2353 07/24/24 0422 07/24/24 0752  BP: 128/82 130/85 (!) 142/89 137/82  Pulse: (!) 102 95 92 94  Resp:   16   Temp: 98.3 F (36.8 C)  98.3 F (36.8 C) 98.7 F (37.1 C)  TempSrc:   Oral Oral  SpO2: 100% 100% 100% 99%  Weight:      Height:        Intake/Output Summary (Last 24 hours) at 07/24/2024 0956 Last data filed at 07/24/2024 0300 Gross per 24 hour  Intake 1247.04 ml  Output 500 ml  Net 747.04 ml   Wt Readings from Last 3 Encounters:  07/20/24 97.1 kg  03/15/23 86.6 kg  03/13/23 86.6 kg    Examination:  Constitutional: NAD Eyes: lids and conjunctivae normal, no scleral icterus ENMT: mmm Neck: normal, supple Respiratory: clear to auscultation bilaterally, no wheezing, no crackles.  Cardiovascular: Regular rate and rhythm, no murmurs / rubs / gallops. No LE edema. Abdomen: soft, no distention, no tenderness. Bowel sounds positive.   Data Reviewed: I have independently reviewed following labs and imaging studies   CBC Recent Labs  Lab 07/20/24 0650 07/21/24 0640 07/22/24 1011 07/23/24 0537 07/24/24 0401  WBC 13.5* 13.0* 18.8* 17.0* 21.3*  HGB 9.3* 9.3* 10.0* 9.4* 7.7*  HCT 28.3* 28.4* 30.7* 29.4* 23.6*  PLT 419* 447* 594* 624* 674*  MCV 87.6 87.7 88.0 88.8 87.1  MCH 28.8 28.7 28.7 28.4 28.4  MCHC 32.9 32.7 32.6 32.0 32.6  RDW 12.8 13.0 13.1 12.9 13.0    Recent Labs  Lab 07/19/24 2021 07/19/24 2022 07/19/24 2022 07/20/24 0650 07/20/24 1216 07/21/24 0640 07/22/24 1011 07/23/24 0537 07/23/24 2352 07/24/24 0401  NA  --  131*   < > 134*  --  131* 133* 134*  --  131*  K  --  3.4*   < > 3.5  --  4.1 3.7 3.5  --  4.0  CL  --  95*   < > 98  --  97* 102 105  --  100  CO2  --  24   < > 25  --  24 21* 20*  --  20*  GLUCOSE  --  154*   < > 183*  --  219* 144* 183* 458* 373*  BUN  --  20   < > 21*  --  28* 22* 17  --  28*  CREATININE  --  1.47*   < > 1.49*  --   2.25* 1.73* 1.49*  --  1.73*  CALCIUM   --  8.3*   < > 7.9*  --  7.9* 8.0* 7.9*  --  7.6*  AST  --  90*  --  31  --  32  --   --   --  17  ALT  --  62*  --  45*  --  34  --   --   --  25  ALKPHOS  --  157*  --  125  --  139*  --   --   --  150*  BILITOT  --  0.8  --  0.9  --  0.8  --   --   --  0.4  ALBUMIN  --  1.6*   < > <1.5*  --  <1.5* <1.5* <1.5*  --  <1.5*  MG  --   --   --  1.9  --  1.8 2.1 2.0  --  2.0  CRP  --   --   --   --  31.2* 23.1*  --   --   --   --   LATICACIDVEN 0.9  --   --   --   --   --   --   --   --   --   HGBA1C  --   --   --   --  15.2*  --   --   --   --   --    < > = values in this interval not displayed.    ------------------------------------------------------------------------------------------------------------------ No results for input(s): CHOL, HDL, LDLCALC, TRIG, CHOLHDL, LDLDIRECT in the last 72 hours.  Lab Results  Component Value Date   HGBA1C 15.2 (H) 07/20/2024   ------------------------------------------------------------------------------------------------------------------ No results for input(s): TSH, T4TOTAL, T3FREE, THYROIDAB in the last 72 hours.  Invalid input(s): FREET3  Cardiac Enzymes No results for input(s): CKMB, TROPONINI, MYOGLOBIN in the last 168 hours.  Invalid input(s): CK ------------------------------------------------------------------------------------------------------------------ No results found for: BNP  CBG: Recent Labs  Lab 07/23/24 1657 07/23/24 2024 07/23/24 2315 07/24/24 0600 07/24/24 0838  GLUCAP 319* 416* 420* 301* 281*    Recent Results (from the past 240 hours)  Blood culture (routine x 2)     Status: None   Collection Time: 07/19/24  8:23 PM   Specimen: BLOOD RIGHT HAND  Result Value Ref Range Status   Specimen Description BLOOD RIGHT HAND  Final   Special Requests   Final    BOTTLES DRAWN AEROBIC AND ANAEROBIC Blood Culture results may not be optimal due to an  inadequate volume of blood received in culture bottles   Culture   Final    NO GROWTH 5 DAYS Performed at [facility], [address], [place], [place] [address]    Report Status 07/24/2024 FINAL  Final  Blood culture (routine x 2)     Status: None   Collection Time: 07/19/24  8:23 PM   Specimen: BLOOD LEFT HAND  Result Value Ref Range Status   Specimen Description BLOOD LEFT HAND  Final   Special Requests   Final    BOTTLES DRAWN AEROBIC ONLY Blood Culture results may not be optimal due to an inadequate volume of blood received in culture bottles   Culture   Final    NO GROWTH 5 DAYS Performed at [facility], [address] [place], [place] [address]    Report Status 07/24/2024 FINAL  Final  MRSA Next Gen by PCR, Nasal     Status: None   Collection Time: 07/23/24  5:58 AM   Specimen: Nasal Mucosa; Nasal Swab  Result Value Ref Range Status   MRSA by PCR Next Gen NOT DETECTED NOT DETECTED Final    Comment: (NOTE) The GeneXpert MRSA Assay (FDA approved for NASAL specimens only), is one component of a comprehensive MRSA colonization surveillance program. It is not intended to diagnose MRSA infection nor to guide or monitor treatment for MRSA infections. Test performance is not FDA approved in patients less than 74 years old. Performed at [facility], [address] [place], [place] [address]   Aerobic/Anaerobic Culture w Gram Stain (surgical/deep wound)     Status: None (Preliminary result)   Collection Time:  07/23/24  1:22 PM   Specimen: Soft Tissue, Other  Result Value Ref Range Status   Specimen Description TISSUE  Final   Special Requests ABSCESS RIGHT BELOW KNEE AMPUTATION  Final   Gram Stain   Final    FEW WBC PRESENT, PREDOMINANTLY PMN RARE GRAM POSITIVE COCCI    Culture   Final    CULTURE REINCUBATED FOR BETTER GROWTH Performed at [facility], [address], [place], [place] [address]    Report Status PENDING  Incomplete      Radiology Studies: No results found.   Nilda Fendt, MD, PhD [facility]  Between 7 am - 7 pm I am available, please contact me via Amion (for emergencies) or Securechat (non urgent messages)  Between 7 pm - 7 am I am not available, please contact night coverage MD/APP via Amion

## 2024-07-24 NOTE — Progress Notes (Incomplete)
 PHARMACY CONSULT NOTE FOR:  OUTPATIENT  PARENTERAL ANTIBIOTIC THERAPY (OPAT)  Indication:  Regimen:  End date:   IV antibiotic discharge orders are pended. To discharging provider:  please sign these orders via discharge navigator,  Select New Orders & click on the button choice - Manage This Unsigned Work.     Thank you for allowing pharmacy to be a part of this patient's care.  [name] 07/24/2024, 9:38 AM

## 2024-07-24 NOTE — TOC Progression Note (Signed)
 Transition of Care [facility]) - Progression Note    Patient Details  Name: [name] MRN: [id number] Date of Birth: Feb 26, 1986  Transition of Care [facility] CM/SW Contact  Rosalva Jon Bloch, RN Phone Number: 07/24/2024, 6:55 PM  Clinical Narrative:    Per ID pt will require LT IV ABX therapy, a 4-week course. Pt agreeable to home infusion therapy. Pt without provider preference. Referral made with [facility] and accepted for HHRN and antibiotic therapy. Home infusion teaching completed by Pam/ [facility]  @ bedside with pt. PICC line in place...  IP CM team following for needs.  Expected Discharge Plan: Home/Self Care Barriers to Discharge: Continued Medical Work up               Expected Discharge Plan and Services   Discharge Planning Services: CM Consult   Living arrangements for the past 2 months: Single Family Home                 DME Arranged: Other see comment (IV ABX therapy)   Date DME Agency Contacted: 07/24/24 Time DME Agency Contacted: [phone] Representative spoke with at DME Agency: PAM HH Arranged: RN HH Agency: Other - See comment ([facility]) Date HH Agency Contacted: 07/24/24 Time HH Agency Contacted: [phone] Representative spoke with at [facility] Agency: Pam   Social Drivers of Health (SDOH) Interventions SDOH Screenings   Food Insecurity: No Food Insecurity (07/20/2024)  Housing: Low Risk  (07/20/2024)  Transportation Needs: No Transportation Needs (07/20/2024)  Utilities: Not At Risk (07/20/2024)  Alcohol Screen: Low Risk  (03/16/2023)  Depression (PHQ2-9): Low Risk  (03/16/2023)  Financial Resource Strain: Low Risk  (03/16/2023)  Physical Activity: Insufficiently Active (03/16/2023)  Social Connections: Moderately Integrated (07/20/2024)  Stress: No Stress Concern Present (03/16/2023)  Tobacco Use: Low Risk  (07/23/2024)    Readmission Risk Interventions     No data to display

## 2024-07-24 NOTE — Plan of Care (Signed)
  Problem: Fluid Volume: Goal: Ability to maintain a balanced intake and output will improve Outcome: Progressing   Problem: Education: Goal: Knowledge of General Education information will improve Description: Including pain rating scale, medication(s)/side effects and non-pharmacologic comfort measures Outcome: Progressing   Problem: Activity: Goal: Risk for activity intolerance will decrease Outcome: Progressing   Problem: Pain Managment: Goal: General experience of comfort will improve and/or be controlled Outcome: Progressing

## 2024-07-24 NOTE — Progress Notes (Signed)
 Patient ID: [name], female   DOB: 1986/06/28, 38 y.o.   MRN: [id number] Patient is postoperative day 1 revision transtibial amputation.  There is 150 cc in the wound VAC canister cultures are showing gram-positive cocci.  Patient had a large abscess with significant amount of infected soft tissue.  Tissue margins were grossly clear at closure of surgery however patient will need at least 3 weeks of oral antibiotics to cover the residual bacteria.  Patient is good with her mobility anticipate she can discharge to home on an oral antibiotic and a Prevena portable wound VAC pump.

## 2024-07-25 DIAGNOSIS — A491 Streptococcal infection, unspecified site: Secondary | ICD-10-CM | POA: Diagnosis not present

## 2024-07-25 DIAGNOSIS — E1169 Type 2 diabetes mellitus with other specified complication: Secondary | ICD-10-CM | POA: Diagnosis not present

## 2024-07-25 DIAGNOSIS — M86461 Chronic osteomyelitis with draining sinus, right tibia and fibula: Secondary | ICD-10-CM | POA: Diagnosis not present

## 2024-07-25 DIAGNOSIS — T879 Unspecified complications of amputation stump: Secondary | ICD-10-CM | POA: Diagnosis not present

## 2024-07-25 LAB — BASIC METABOLIC PANEL WITH GFR
Anion gap: 9 (ref 5–15)
BUN: 25 mg/dL — ABNORMAL HIGH (ref 6–20)
CO2: 22 mmol/L (ref 22–32)
Calcium: 7.6 mg/dL — ABNORMAL LOW (ref 8.9–10.3)
Chloride: 105 mmol/L (ref 98–111)
Creatinine, Ser: 1.61 mg/dL — ABNORMAL HIGH (ref 0.44–1.00)
GFR, Estimated: 42 mL/min — ABNORMAL LOW (ref >60)
Glucose, Bld: 148 mg/dL — ABNORMAL HIGH (ref 70–99)
Potassium: 3.5 mmol/L (ref 3.5–5.1)
Sodium: 136 mmol/L (ref 135–145)

## 2024-07-25 LAB — CBC
HCT: 22.6 % — ABNORMAL LOW (ref 36.0–46.0)
Hemoglobin: 7.3 g/dL — ABNORMAL LOW (ref 12.0–15.0)
MCH: 29 pg (ref 26.0–34.0)
MCHC: 32.3 g/dL (ref 30.0–36.0)
MCV: 89.7 fL (ref 80.0–100.0)
Platelets: 712 K/uL — ABNORMAL HIGH (ref 150–400)
RBC: 2.52 MIL/uL — ABNORMAL LOW (ref 3.87–5.11)
RDW: 13.2 % (ref 11.5–15.5)
WBC: 19.7 K/uL — ABNORMAL HIGH (ref 4.0–10.5)
nRBC: 0 % (ref 0.0–0.2)

## 2024-07-25 LAB — GLUCOSE, CAPILLARY
Glucose-Capillary: 96 mg/dL (ref 70–99)
Glucose-Capillary: 150 mg/dL — ABNORMAL HIGH (ref 70–99)
Glucose-Capillary: 116 mg/dL — ABNORMAL HIGH (ref 70–99)
Glucose-Capillary: 182 mg/dL — ABNORMAL HIGH (ref 70–99)
Glucose-Capillary: 123 mg/dL — ABNORMAL HIGH (ref 70–99)
Glucose-Capillary: 97 mg/dL (ref 70–99)

## 2024-07-25 LAB — MAGNESIUM: Magnesium: 1.9 mg/dL (ref 1.7–2.4)

## 2024-07-25 LAB — PREPARE RBC (CROSSMATCH)

## 2024-07-25 MED ORDER — INSULIN GLARGINE-YFGN 100 UNIT/ML ~~LOC~~ SOLN
30.0000 [IU] | Freq: Every day | SUBCUTANEOUS | Status: DC
  Administered 2024-07-25 – 2024-07-26 (×2): 30 [IU] via SUBCUTANEOUS
  Filled 2024-07-25 (×3): qty 0.3

## 2024-07-25 MED ORDER — SODIUM CHLORIDE 0.9% IV SOLUTION: Freq: Once | INTRAVENOUS | Status: AC

## 2024-07-25 MED ORDER — OXYCODONE HCL 5 MG PO TABS
5.0000 mg | ORAL_TABLET | ORAL | Status: DC | PRN
  Administered 2024-07-25 – 2024-07-26 (×2): 10 mg via ORAL
  Administered 2024-07-26 – 2024-07-27 (×4): 5 mg via ORAL
  Filled 2024-07-25: qty 2
  Filled 2024-07-25: qty 1
  Filled 2024-07-25: qty 2
  Filled 2024-07-25 (×3): qty 1

## 2024-07-25 NOTE — Progress Notes (Signed)
 Subjective: No new complaints   Antibiotics:  Anti-infectives (From admission, onward)    Start     Dose/Rate Route Frequency Ordered Stop   07/25/24 0600  ceFAZolin  (ANCEF ) IVPB 2g/100 mL premix        2 g 200 mL/hr over 30 Minutes Intravenous Every 8 hours 07/24/24 1609 08/21/24 0559   07/24/24 1030  DAPTOmycin (CUBICIN) 600 mg in sodium chloride  0.9 % IVPB  Status:  Discontinued        8 mg/kg  75.8 kg (Adjusted) 124 mL/hr over 30 Minutes Intravenous Daily 07/24/24 0936 07/24/24 0951   07/24/24 1030  DAPTOmycin (CUBICIN) IVPB 700 mg/113mL premix  Status:  Discontinued        700 mg 200 mL/hr over 30 Minutes Intravenous Daily 07/24/24 0951 07/24/24 1555   07/23/24 1800  ceFEPIme (MAXIPIME) 2 g in sodium chloride  0.9 % 100 mL IVPB  Status:  Discontinued        2 g 200 mL/hr over 30 Minutes Intravenous Every 8 hours 07/23/24 1054 07/24/24 0932   07/23/24 1343  tobramycin (NEBCIN) powder  Status:  Discontinued          As needed 07/23/24 1343 07/23/24 1407   07/23/24 1343  vancomycin (VANCOCIN) powder  Status:  Discontinued          As needed 07/23/24 1344 07/23/24 1407   07/21/24 1800  metroNIDAZOLE  (FLAGYL ) IVPB 500 mg  Status:  Discontinued        500 mg 100 mL/hr over 60 Minutes Intravenous Every 12 hours 07/21/24 1517 07/24/24 0932   07/21/24 1000  ceFEPIme (MAXIPIME) 2 g in sodium chloride  0.9 % 100 mL IVPB  Status:  Discontinued        2 g 200 mL/hr over 30 Minutes Intravenous Every 12 hours 07/21/24 0842 07/23/24 1054   07/20/24 2000  linezolid (ZYVOX) IVPB 600 mg  Status:  Discontinued        600 mg 300 mL/hr over 60 Minutes Intravenous Every 12 hours 07/20/24 0737 07/24/24 0932   07/20/24 1000  piperacillin-tazobactam (ZOSYN) IVPB 3.375 g  Status:  Discontinued        3.375 g 12.5 mL/hr over 240 Minutes Intravenous Every 8 hours 07/20/24 0748 07/21/24 0826   07/20/24 0215  piperacillin-tazobactam (ZOSYN) IVPB 3.375 g        3.375 g 100 mL/hr over 30  Minutes Intravenous  Once 07/20/24 0203 07/20/24 0638   07/20/24 0215  linezolid (ZYVOX) IVPB 600 mg        600 mg 300 mL/hr over 60 Minutes Intravenous  Once 07/20/24 0203 07/20/24 0815       Medications: Scheduled Meds:  sodium chloride    Intravenous Once   influenza vac split trivalent PF  0.5 mL Intramuscular Tomorrow-1000   insulin  aspart  0-15 Units Subcutaneous TID WC   insulin  aspart  0-5 Units Subcutaneous QHS   insulin  aspart  7 Units Subcutaneous TID WC   insulin  glargine-yfgn  30 Units Subcutaneous QHS   metoCLOPramide  (REGLAN ) injection  5 mg Intravenous TID AC & HS   NIFEdipine   30 mg Oral Daily   pneumococcal 20-valent conjugate vaccine  0.5 mL Intramuscular Tomorrow-1000   sodium chloride  flush  10-40 mL Intracatheter Q12H   Continuous Infusions:   ceFAZolin  (ANCEF ) IV 2 g (07/25/24 1201)   PRN Meds:.acetaminophen , HYDROmorphone (DILAUDID) injection, loperamide, ondansetron  (ZOFRAN ) IV, oxyCODONE , sodium chloride  flush    Objective: Weight change:   Intake/Output Summary (Last  24 hours) at 07/25/2024 1237 Last data filed at 07/24/2024 1819 Gross per 24 hour  Intake --  Output 50 ml  Net -50 ml   Blood pressure (!) 143/87, pulse 95, temperature 98 F (36.7 C), resp. rate 16, height 5' 7 (1.702 m), weight 97.1 kg, last menstrual period 06/02/2024, SpO2 100%. Temp:  [98 F (36.7 C)-98.5 F (36.9 C)] 98 F (36.7 C) (10/24 0755) Pulse Rate:  [95-102] 95 (10/24 0755) Resp:  [16] 16 (10/23 1943) BP: (112-143)/(68-87) 143/87 (10/24 0755) SpO2:  [99 %-100 %] 100 % (10/24 0755)  Physical Exam: Physical Exam Constitutional:      General: She is not in acute distress.    Appearance: She is well-developed. She is not diaphoretic.  HENT:     Head: Normocephalic and atraumatic.     Right Ear: External ear normal.     Left Ear: External ear normal.     Mouth/Throat:     Pharynx: No oropharyngeal exudate.  Eyes:     General: No scleral icterus.     Conjunctiva/sclera: Conjunctivae normal.     Pupils: Pupils are equal, round, and reactive to light.  Cardiovascular:     Rate and Rhythm: Normal rate and regular rhythm.  Pulmonary:     Effort: Pulmonary effort is normal. No respiratory distress.     Breath sounds: No wheezing.  Abdominal:     General: There is no distension.     Palpations: Abdomen is soft.  Musculoskeletal:        General: No tenderness.  Lymphadenopathy:     Cervical: No cervical adenopathy.  Skin:    General: Skin is warm and dry.     Coloration: Skin is not pale.     Findings: No erythema or rash.  Neurological:     General: No focal deficit present.     Mental Status: She is alert and oriented to person, place, and time.     Motor: No abnormal muscle tone.     Coordination: Coordination normal.  Psychiatric:        Mood and Affect: Mood normal.        Behavior: Behavior normal.        Thought Content: Thought content normal.        Judgment: Judgment normal.     Right BKA site wrapped  CBC:    BMET Recent Labs    07/24/24 0401 07/25/24 0500  NA 131* 136  K 4.0 3.5  CL 100 105  CO2 20* 22  GLUCOSE 373* 148*  BUN 28* 25*  CREATININE 1.73* 1.61*  CALCIUM  7.6* 7.6*     Liver Panel  Recent Labs    07/23/24 0537 07/24/24 0401  PROT  --  6.5  ALBUMIN <1.5* <1.5*  AST  --  17  ALT  --  25  ALKPHOS  --  150*  BILITOT  --  0.4       Sedimentation Rate No results for input(s): ESRSEDRATE in the last 72 hours. C-Reactive Protein No results for input(s): CRP in the last 72 hours.  Micro Results: Recent Results (from the past 720 hours)  Blood culture (routine x 2)     Status: None   Collection Time: 07/19/24  8:23 PM   Specimen: BLOOD RIGHT HAND  Result Value Ref Range Status   Specimen Description BLOOD RIGHT HAND  Final   Special Requests   Final    BOTTLES DRAWN AEROBIC AND ANAEROBIC Blood Culture results may not be  optimal due to an inadequate volume of blood  received in culture bottles   Culture   Final    NO GROWTH 5 DAYS Performed at [facility], [address] [place], [place] [address]    Report Status 07/24/2024 FINAL  Final  Blood culture (routine x 2)     Status: None   Collection Time: 07/19/24  8:23 PM   Specimen: BLOOD LEFT HAND  Result Value Ref Range Status   Specimen Description BLOOD LEFT HAND  Final   Special Requests   Final    BOTTLES DRAWN AEROBIC ONLY Blood Culture results may not be optimal due to an inadequate volume of blood received in culture bottles   Culture   Final    NO GROWTH 5 DAYS Performed at [facility], [address] [place], [place] [address]    Report Status 07/24/2024 FINAL  Final  MRSA Next Gen by PCR, Nasal     Status: None   Collection Time: 07/23/24  5:58 AM   Specimen: Nasal Mucosa; Nasal Swab  Result Value Ref Range Status   MRSA by PCR Next Gen NOT DETECTED NOT DETECTED Final    Comment: (NOTE) The GeneXpert MRSA Assay (FDA approved for NASAL specimens only), is one component of a comprehensive MRSA colonization surveillance program. It is not intended to diagnose MRSA infection nor to guide or monitor treatment for MRSA infections. Test performance is not FDA approved in patients less than 40 years old. Performed at [facility], [address] [place], [place] [address]   Aerobic/Anaerobic Culture w Gram Stain (surgical/deep wound)     Status: None (Preliminary result)   Collection Time: 07/23/24  1:22 PM   Specimen: Soft Tissue, Other  Result Value Ref Range Status   Specimen Description TISSUE  Final   Special Requests ABSCESS RIGHT BELOW KNEE AMPUTATION  Final   Gram Stain   Final    FEW WBC PRESENT, PREDOMINANTLY PMN RARE GRAM POSITIVE COCCI Performed at [facility], [address], [place], [place] [address]    Culture   Final    ABUNDANT STREPTOCOCCUS AGALACTIAE TESTING AGAINST S. AGALACTIAE NOT ROUTINELY PERFORMED DUE TO PREDICTABILITY OF  AMP/PEN/VAN SUSCEPTIBILITY. NO ANAEROBES ISOLATED; CULTURE IN PROGRESS FOR 5 DAYS    Report Status PENDING  Incomplete    Studies/Results: US  EKG SITE RITE Result Date: 07/24/2024 If Site Rite image not attached, placement could not be confirmed due to current cardiac rhythm.     Assessment/Plan:  INTERVAL HISTORY: GBS grew   Principal Problem:   BKA stump complication (HCC) Active Problems:   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 (HCC)   Osteomyelitis of left tibia (HCC)   Group B streptococcal infection    [name] is a 38 y.o. female with DM, BKA complicated by stump infection rx at [facility] in 2022 now here with another new stump infection sp surgery by Dr. Harden  #1 Osteomyelitis at BKA stump infection:  We will give 4 weeks of IV ancef   I will follow-up her operative cultures for anerobes  She has OPAT orders, note and HSFU appt scheduled (see yesterday's note)  I personally spent a total of 50 minutes in the care of the patient today including preparing to see the patient, performing a medically appropriate exam/evaluation, counseling and educating, placing orders, referring and communicating with other health care professionals, documenting clinical information in the EHR, and coordinating care.   Evaluation of the  patient requires complex antimicrobial therapy evaluation, counseling , isolation needs to reduce disease transmission and risk assessment and mitigation.   I will sign off for now.  Please call with further questions.   LOS: 5 days   [name] 07/25/2024, 12:37 PM

## 2024-07-25 NOTE — Progress Notes (Signed)
 PROGRESS NOTE  [name] FMW:[id number] DOB: 12-25-1985 DOA: 07/19/2024 PCP: [name] [name], NP   LOS: 5 days   Brief Narrative / Interim history: 38 y.o. female with PMH of IDDM-2, HTN and Rt BKA wound presenting with increasing right BKA swelling and purulent drainage, and admitted with working diagnosis of sepsis due to right BKA cellulitis/osteomyelitis.  Imaging with MRI showed large complex fluid collection containing multiple air bubbles within soft tissue of the distal stump suspicious for abscess and underlying gas-forming infection, possible early distal tibial osteomyelitis, possible patellar bursitis.  Orthopedic surgery consulted  Subjective / 24h Interval events: She is doing well today, complains of pain in her leg  Assesement and Plan: Principal problem Sepsis due to right BKA osteomyelitis and abscess -met sepsis criteria on admission with fever, white count, source.  Has been placed on antibiotics, continue.  Orthopedic surgery consulted, she was taken to the OR 10/22 status post I&D/revision.  Blood cultures are without growth, intraoperative cultures are growing GPC's - ID consulted today, discussed with Dr. Lindia, appreciate input, she will need 4 weeks of IV antibiotics, PICC line was placed - PT evaluation pending  Active problems DM 2, poorly controlled, with hyperglycemia-continue glargine, sliding scale.  Hyperglycemic, increase glargine today  CBG (last 3)  Recent Labs    07/25/24 0335 07/25/24 0607 07/25/24 0755  GLUCAP 96 150* 116*   Nausea, vomiting-possible gastroparesis.  Had recurrent vomiting this morning.  No abdominal pain.  Supportive care  AKI-creatinine overall stable, strong suspicion that she may have a degree of CKD given her poorly controlled diabetes.  Creatinine stable today  Hypokalemia-replenish and continue to monitor, potassium stable at 3.5, will give 1 additional dose  Acute blood loss anemia-postoperatively,  hemoglobin down to 7.7.  No bleeding, continue to closely monitor  Leukocytosis-slightly worse postoperatively, antibiotics per ID, monitor, trending down  Acute blood loss anemia-postoperatively, hemoglobin trending down and is 7.3 today.  Will transfuse unit of packed red blood cells given trend  Essential hypertension-continue Procardia   Elevated LFTs-resolved  Obesity, class I-BMI 33  Scheduled Meds:  influenza vac split trivalent PF  0.5 mL Intramuscular Tomorrow-1000   insulin  aspart  0-15 Units Subcutaneous TID WC   insulin  aspart  0-5 Units Subcutaneous QHS   insulin  aspart  7 Units Subcutaneous TID WC   insulin  glargine-yfgn  30 Units Subcutaneous QHS   metoCLOPramide  (REGLAN ) injection  5 mg Intravenous TID AC & HS   NIFEdipine   30 mg Oral Daily   pneumococcal 20-valent conjugate vaccine  0.5 mL Intramuscular Tomorrow-1000   sodium chloride  flush  10-40 mL Intracatheter Q12H   Continuous Infusions:   ceFAZolin  (ANCEF ) IV 2 g (07/25/24 0544)   PRN Meds:.acetaminophen , HYDROmorphone (DILAUDID) injection, loperamide, ondansetron  (ZOFRAN ) IV, oxyCODONE , sodium chloride  flush  Current Outpatient Medications  Medication Instructions   acetaminophen  (TYLENOL ) 1,000 mg, Every 6 hours PRN   azithromycin  (ZITHROMAX ) 250 mg, Oral, As directed   Lantus  SoloStar 15 Units, Subcutaneous, Daily at bedtime, Patient is taking only 10 unit   NIFEdipine  (ADALAT  CC) 30 mg, Oral, Daily   NovoLOG  FlexPen 5 Units, Subcutaneous, 3 times daily with meals, Also can use sliding scale if needed. Patient is taking 3 U at dinner and 5 unit Breakfast and lunch   ondansetron  (ZOFRAN -ODT) 4 mg, Oral, Every 6 hours PRN    Diet Orders (From admission, onward)     Start     Ordered   07/23/24 1504  Diet Carb Modified Fluid consistency: Thin;  Room service appropriate? Yes  Diet effective now       Question Answer Comment  Diet-HS Snack? Nothing   Calorie Level Medium 1600-2000   Fluid consistency:  Thin   Room service appropriate? Yes      07/23/24 1503            DVT prophylaxis:    Lab Results  Component Value Date   PLT 712 (H) 07/25/2024      Code Status: Full Code  Family Communication: No family at bedside  Status is: Inpatient Remains inpatient appropriate because: Awaiting intraoperative cultures   Level of care: Telemetry Surgical  Consultants:  Orthopedic surgery  Objective: Vitals:   07/24/24 1503 07/24/24 1943 07/25/24 0409 07/25/24 0755  BP: 119/76 112/68 131/79 (!) 143/87  Pulse: (!) 101 (!) 102 96 95  Resp:  16    Temp: 98.4 F (36.9 C) 98.3 F (36.8 C) 98.5 F (36.9 C) 98 F (36.7 C)  TempSrc:  Oral    SpO2: 100% 99% 100% 100%  Weight:      Height:        Intake/Output Summary (Last 24 hours) at 07/25/2024 1050 Last data filed at 07/24/2024 1819 Gross per 24 hour  Intake --  Output 50 ml  Net -50 ml   Wt Readings from Last 3 Encounters:  07/20/24 97.1 kg  03/15/23 86.6 kg  03/13/23 86.6 kg    Examination:  Constitutional: NAD Eyes: lids and conjunctivae normal, no scleral icterus ENMT: mmm Neck: normal, supple Respiratory: clear to auscultation bilaterally, no wheezing, no crackles.  Cardiovascular: Regular rate and rhythm, no murmurs / rubs / gallops. No LE edema. Abdomen: soft, no distention, no tenderness. Bowel sounds positive.   Data Reviewed: I have independently reviewed following labs and imaging studies   CBC Recent Labs  Lab 07/21/24 0640 07/22/24 1011 07/23/24 0537 07/24/24 0401 07/25/24 0500  WBC 13.0* 18.8* 17.0* 21.3* 19.7*  HGB 9.3* 10.0* 9.4* 7.7* 7.3*  HCT 28.4* 30.7* 29.4* 23.6* 22.6*  PLT 447* 594* 624* 674* 712*  MCV 87.7 88.0 88.8 87.1 89.7  MCH 28.7 28.7 28.4 28.4 29.0  MCHC 32.7 32.6 32.0 32.6 32.3  RDW 13.0 13.1 12.9 13.0 13.2    Recent Labs  Lab 07/19/24 2021 07/19/24 2022 07/19/24 2022 07/20/24 0650 07/20/24 1216 07/21/24 0640 07/22/24 1011 07/23/24 0537 07/23/24 2352  07/24/24 0401 07/25/24 0500  NA  --  131*   < > 134*  --  131* 133* 134*  --  131* 136  K  --  3.4*   < > 3.5  --  4.1 3.7 3.5  --  4.0 3.5  CL  --  95*   < > 98  --  97* 102 105  --  100 105  CO2  --  24   < > 25  --  24 21* 20*  --  20* 22  GLUCOSE  --  154*   < > 183*  --  219* 144* 183* 458* 373* 148*  BUN  --  20   < > 21*  --  28* 22* 17  --  28* 25*  CREATININE  --  1.47*   < > 1.49*  --  2.25* 1.73* 1.49*  --  1.73* 1.61*  CALCIUM   --  8.3*   < > 7.9*  --  7.9* 8.0* 7.9*  --  7.6* 7.6*  AST  --  90*  --  31  --  32  --   --   --  17  --   ALT  --  62*  --  45*  --  34  --   --   --  25  --   ALKPHOS  --  157*  --  125  --  139*  --   --   --  150*  --   BILITOT  --  0.8  --  0.9  --  0.8  --   --   --  0.4  --   ALBUMIN  --  1.6*   < > <1.5*  --  <1.5* <1.5* <1.5*  --  <1.5*  --   MG  --   --    < > 1.9  --  1.8 2.1 2.0  --  2.0 1.9  CRP  --   --   --   --  31.2* 23.1*  --   --   --   --   --   LATICACIDVEN 0.9  --   --   --   --   --   --   --   --   --   --   HGBA1C  --   --   --   --  15.2*  --   --   --   --   --   --    < > = values in this interval not displayed.    ------------------------------------------------------------------------------------------------------------------ No results for input(s): CHOL, HDL, LDLCALC, TRIG, CHOLHDL, LDLDIRECT in the last 72 hours.  Lab Results  Component Value Date   HGBA1C 15.2 (H) 07/20/2024   ------------------------------------------------------------------------------------------------------------------ No results for input(s): TSH, T4TOTAL, T3FREE, THYROIDAB in the last 72 hours.  Invalid input(s): FREET3  Cardiac Enzymes No results for input(s): CKMB, TROPONINI, MYOGLOBIN in the last 168 hours.  Invalid input(s): CK ------------------------------------------------------------------------------------------------------------------ No results found for: BNP  CBG: Recent Labs  Lab  07/24/24 1635 07/24/24 2124 07/25/24 0335 07/25/24 0607 07/25/24 0755  GLUCAP 276* 215* 96 150* 116*    Recent Results (from the past 240 hours)  Blood culture (routine x 2)     Status: None   Collection Time: 07/19/24  8:23 PM   Specimen: BLOOD RIGHT HAND  Result Value Ref Range Status   Specimen Description BLOOD RIGHT HAND  Final   Special Requests   Final    BOTTLES DRAWN AEROBIC AND ANAEROBIC Blood Culture results may not be optimal due to an inadequate volume of blood received in culture bottles   Culture   Final    NO GROWTH 5 DAYS Performed at [facility], [address], [place], [place] [address]    Report Status 07/24/2024 FINAL  Final  Blood culture (routine x 2)     Status: None   Collection Time: 07/19/24  8:23 PM   Specimen: BLOOD LEFT HAND  Result Value Ref Range Status   Specimen Description BLOOD LEFT HAND  Final   Special Requests   Final    BOTTLES DRAWN AEROBIC ONLY Blood Culture results may not be optimal due to an inadequate volume of blood received in culture bottles   Culture   Final    NO GROWTH 5 DAYS Performed at [facility], [address] [place], [place] [address]    Report Status 07/24/2024 FINAL  Final  MRSA Next Gen by PCR, Nasal     Status: None   Collection Time: 07/23/24  5:58 AM   Specimen: Nasal Mucosa; Nasal Swab  Result Value  Ref Range Status   MRSA by PCR Next Gen NOT DETECTED NOT DETECTED Final    Comment: (NOTE) The GeneXpert MRSA Assay (FDA approved for NASAL specimens only), is one component of a comprehensive MRSA colonization surveillance program. It is not intended to diagnose MRSA infection nor to guide or monitor treatment for MRSA infections. Test performance is not FDA approved in patients less than 51 years old. Performed at [facility], [address] [place], [place] [address]   Aerobic/Anaerobic Culture w Gram Stain (surgical/deep wound)     Status: None (Preliminary result)   Collection  Time: 07/23/24  1:22 PM   Specimen: Soft Tissue, Other  Result Value Ref Range Status   Specimen Description TISSUE  Final   Special Requests ABSCESS RIGHT BELOW KNEE AMPUTATION  Final   Gram Stain   Final    FEW WBC PRESENT, PREDOMINANTLY PMN RARE GRAM POSITIVE COCCI    Culture   Final    ABUNDANT STREPTOCOCCUS AGALACTIAE TESTING AGAINST S. AGALACTIAE NOT ROUTINELY PERFORMED DUE TO PREDICTABILITY OF AMP/PEN/VAN SUSCEPTIBILITY. Performed at [facility], [address] [place], [place] [address]    Report Status PENDING  Incomplete     Radiology Studies: US  EKG SITE RITE Result Date: 07/24/2024 If Site Rite image not attached, placement could not be confirmed due to current cardiac rhythm.    Nilda Fendt, MD, PhD [facility]  Between 7 am - 7 pm I am available, please contact me via Amion (for emergencies) or Securechat (non urgent messages)  Between 7 pm - 7 am I am not available, please contact night coverage MD/APP via Amion

## 2024-07-25 NOTE — Evaluation (Signed)
 Physical Therapy Evaluation Patient Details Name: [name] MRN: [id number] DOB: [birth year] Today's Date: 07/25/2024  History of Present Illness  [name] is a 38 y.o. female admitted 07/19/24 with working diagnosis of sepsis due to right BKA cellulitis/osteomyelitis. Pt presented with swelling and purulent drainage. Pt s/p R BKA revision 10/22. PMHx: HTN and poorly controlled T2DM.   Clinical Impression  Pt admitted with above diagnosis. PTA, pt was independent for functional mobility without AD wearing R BKA prosthesis, independent with ADLs/IADLs, driving, and working. She lives with her young son in a two story apartment with a threshold to enter and the bedroom and full bathroom located up a flight of stairs. Pt currently with functional limitations due to the deficits listed below (see PT Problem List). She required supervision for bed mobility, CGA for sit<>stand using RW, supervision for squat pivot transfer to/from [facility], and CGA for short-distance gait using RW. Pt ambulated a max of ~48ft using a hopping technique. Distance limited d/t dizziness, lightheadedness, and fatigue. Assessed vitals and pt was hypertensive and tachycardic. Educated pt on her CLOF and level of assistance she would require. Pt verbalized understanding that her primarily mobility would be using a manual w/c and only using RW for transfers. She reports that her aunt can provide increased support and check-in on her daily. Pt will benefit from acute skilled PT to increase her independence and safety with mobility to allow discharge. Recommend HHPT to increase strength, improve balance, advance activity tolerance, decrease fall risk, and optimize safety within the home environment.    If plan is discharge home, recommend the following: A little help with walking and/or transfers;A little help with bathing/dressing/bathroom;Assistance with cooking/housework;Assist for transportation;Help with stairs or ramp for  entrance   Can travel by private vehicle        Equipment Recommendations None recommended by PT (Pt already has DME)  Recommendations for Other Services       Functional Status Assessment Patient has had a recent decline in their functional status and demonstrates the ability to make significant improvements in function in a reasonable and predictable amount of time.     Precautions / Restrictions Precautions Precautions: Fall Recall of Precautions/Restrictions: Intact Precaution/Restrictions Comments: Wound Vac RLE Restrictions Weight Bearing Restrictions Per Provider Order: Yes RLE Weight Bearing Per Provider Order: Non weight bearing      Mobility  Bed Mobility Overal bed mobility: Needs Assistance Bed Mobility: Supine to Sit     Supine to sit: HOB elevated, Used rails, Supervision     General bed mobility comments: Pt sat up on R side of bed with increased time. HOB elevated ~40deg and use of bed rail.    Transfers Overall transfer level: Needs assistance Equipment used: Rolling walker (2 wheels), None Transfers: Sit to/from Stand, Bed to chair/wheelchair/BSC Sit to Stand: Contact guard assist     Squat pivot transfers: Supervision     General transfer comment: Pt stood from lowest bed height and chair. She demonstrated proper hand placement using RW. Pt powered up with light assist. Good eccentric control. She completed recliner chair<>BSC transfer without AD. Sup for safety.    Ambulation/Gait Ambulation/Gait assistance: Contact guard assist Gait Distance (Feet): 6 Feet (x2, seated rest break between bouts.) Assistive device: Rolling walker (2 wheels) Gait Pattern/deviations: Step-to pattern Gait velocity: decreased     General Gait Details: Pt ambulated using a hopping technique. Heavy reliance on BUE support on RW. Assist to manage lines/leads. Pt navigated the tight room enviornment. Distance limited  d/t dizziness, lightheadedness, and  fatigue.  Stairs            Wheelchair Mobility     Tilt Bed    Modified Rankin (Stroke Patients Only)       Balance Overall balance assessment: Needs assistance Sitting-balance support: Single extremity supported, Feet supported Sitting balance-Leahy Scale: Fair Sitting balance - Comments: Pt sat EOB with supervision. She performed pericare indep.   Standing balance support: Bilateral upper extremity supported, During functional activity, Reliant on assistive device for balance Standing balance-Leahy Scale: Poor Standing balance comment: Pt dependent on RW                             Pertinent Vitals/Pain Pain Assessment Pain Assessment: 0-10 Pain Score: 3  Pain Location: R residual limb Pain Descriptors / Indicators: Burning, Pins and needles Pain Intervention(s): Monitored during session, Limited activity within patient's tolerance, Repositioned    Home Living Family/patient expects to be discharged to:: Private residence Living Arrangements: Children (Son (5 y.o.)) Available Help at Discharge: Family;Available PRN/intermittently (Aunt lives 7 mins away and works from home) Type of Home: Apartment Home Access: Stairs to enter Entrance Stairs-Rails: None Entrance Stairs-Number of Steps: 1 (threshold) Alternate Level Stairs-Number of Steps: 15 Home Layout: Two level;1/2 bath on main level;Bed/bath upstairs Home Equipment: Agricultural consultant (2 wheels);Shower seat;Wheelchair - manual;Cane - single point;Tub bench      Prior Function Prior Level of Function : Independent/Modified Independent;Driving;Working/employed             Mobility Comments: Ambulates without AD and wearing R BKA prosthesis. Denies fall hx. ADLs Comments: Indep with ADLs/IADLs. Works in [employer]. Drives.     Extremity/Trunk Assessment   Upper Extremity Assessment Upper Extremity Assessment: Overall WFL for tasks assessed;Right hand dominant    Lower Extremity  Assessment Lower Extremity Assessment: RLE deficits/detail RLE Deficits / Details: Pt POD 2 s/p BKA revision. Decreased hip and knee AROM. Grossly 3/5 strength. RLE Sensation: decreased proprioception RLE Coordination: decreased gross motor    Cervical / Trunk Assessment Cervical / Trunk Assessment: Normal  Communication   Communication Communication: No apparent difficulties    Cognition Arousal: Alert Behavior During Therapy: WFL for tasks assessed/performed   PT - Cognitive impairments: No apparent impairments                       PT - Cognition Comments: Pt A,Ox4 Following commands: Intact       Cueing Cueing Techniques: Verbal cues     General Comments General comments (skin integrity, edema, etc.): Pt c/o dizziness and lightheadedness. Assessed vitals: seated after first gait bout BP 147/82 (100), HR 109 &  seated after seond gait bout BP 159/84 (105), HR 120. Reviewed R BKA HEP. Discussed d/c disposition, pt's CLOF, and level of support she would require. Pt declined referral for AIR.    Exercises     Assessment/Plan    PT Assessment Patient needs continued PT services  PT Problem List Decreased strength;Decreased activity tolerance;Decreased balance;Decreased mobility;Cardiopulmonary status limiting activity       PT Treatment Interventions DME instruction;Gait training;Stair training;Functional mobility training;Therapeutic activities;Therapeutic exercise;Balance training;Patient/family education    PT Goals (Current goals can be found in the Care Plan section)  Acute Rehab PT Goals Patient Stated Goal: Return Home PT Goal Formulation: With patient Time For Goal Achievement: 08/08/24 Potential to Achieve Goals: Good Additional Goals Additional Goal #1: Pt will propel manual w/c  141ft with modI    Frequency Min 2X/week     Co-evaluation               AM-PAC PT 6 Clicks Mobility  Outcome Measure Help needed turning from your back to  your side while in a flat bed without using bedrails?: A Little Help needed moving from lying on your back to sitting on the side of a flat bed without using bedrails?: A Little Help needed moving to and from a bed to a chair (including a wheelchair)?: A Little Help needed standing up from a chair using your arms (e.g., wheelchair or bedside chair)?: A Little Help needed to walk in hospital room?: Total Help needed climbing 3-5 steps with a railing? : Total 6 Click Score: 14    End of Session Equipment Utilized During Treatment: Gait belt Activity Tolerance: Patient tolerated treatment well Patient left: in chair;with call bell/phone within reach;with chair alarm set Nurse Communication: Mobility status PT Visit Diagnosis: Difficulty in walking, not elsewhere classified (R26.2);Other abnormalities of gait and mobility (R26.89);Unsteadiness on feet (R26.81)    Time: 8796-8756 PT Time Calculation (min) (ACUTE ONLY): 40 min   Charges:   PT Evaluation $PT Eval Moderate Complexity: 1 Mod PT Treatments $Therapeutic Activity: 8-22 mins PT General Charges $$ ACUTE PT VISIT: 1 Visit         Randall SAUNDERS, PT, DPT Acute Rehabilitation Services Office: [phone] Secure Chat Preferred  Delon CHRISTELLA Callander 07/25/2024, 1:13 PM

## 2024-07-25 NOTE — Inpatient Diabetes Management (Signed)
 Inpatient Diabetes Program Recommendations  AACE/ADA: New Consensus Statement on Inpatient Glycemic Control (2015)  Target Ranges:  Prepandial:   less than 140 mg/dL      Peak postprandial:   less than 180 mg/dL (1-2 hours)      Critically ill patients:  140 - 180 mg/dL   Lab Results  Component Value Date   GLUCAP 116 (H) 07/25/2024   HGBA1C 15.2 (H) 07/20/2024    Review of Glycemic Control  Latest Reference Range & Units 07/24/24 08:38 07/24/24 12:05 07/24/24 16:35 07/24/24 21:24 07/25/24 03:35 07/25/24 06:07 07/25/24 07:55  Glucose-Capillary 70 - 99 mg/dL 718 (H) 760 (H) 723 (H) 215 (H) 96 150 (H) 116 (H)  (H): Data is abnormally high  Outpatient Diabetes medications:  Lantus  20 units at bedtime  Novolog  Sliding Scale (3-6 units) TID    Current orders for Inpatient glycemic control:  Semglee  40 units QHS Novolog  0-15 units TID + 0-5 units at bedtime  Novolog  7 units TID with meals Decadron 10 mg x 1 10/23  Inpatient Diabetes Program Recommendations:  Consider reducing Semglee  to 30 units at bedtime  Discharge Recommendations: Other recommendations: FreeStyle Libre 3+ CGM [phone]) Long acting recommendations: Insulin  Glargine (LANTUS ) Solostar Pen to be determined  Short acting recommendations:  Meal + Correction coverage Insulin  aspart (NOVOLOG ) FlexPen  Sensitive Scale.  to be determined Hypoglycemia treatment recommendations: GVoke 1mg  Supply/Referral recommendations: Glucometer Test strips Lancet device Lancets Pen needles - standard   Use Adult Diabetes Insulin  Treatment Post Discharge order set.  Thanks, Tinnie Minus, MSN, RNC-OB Diabetes Coordinator [phone] (8a-5p)

## 2024-07-25 NOTE — Plan of Care (Signed)
  Problem: Pain Managment: Goal: General experience of comfort will improve and/or be controlled Outcome: Progressing   Problem: Safety: Goal: Ability to remain free from injury will improve Outcome: Progressing

## 2024-07-25 NOTE — Progress Notes (Signed)
    [facility]  Date of Admission:  07/19/2024             BRIEF PROGRESS NOTE:   Diagnosis:  Osteomyelitis / Abscess  Culture Result: Group B streptococcus  Allergies  Allergen Reactions   Almond (Diagnostic) Hives   Almond Oil Hives   Bee Venom Hives   Limonene Hives and Nausea And Vomiting   Chlorhexidine Rash    Unable to use CHG wipes    OPAT Orders Discharge antibiotics to be given via PICC line Discharge antibiotics: Cefazolin  2 g every 8 hours  Per pharmacy protocol   Duration: 4 weeks  End Date: 08/20/24  [facility] Care Per Protocol:  Home health RN for IV administration and teaching; PICC line care and labs.    Labs weekly while on IV antibiotics: _X_ CBC with differential _X_ BMP __ CMP _X_ CRP _X_ ESR __ Vancomycin trough __ CK  _X_ Please pull PIC at completion of IV antibiotics __ Please leave PIC in place until doctor has seen patient or been notified  Fax weekly labs to [phone]  Clinic Follow Up Appt:  Dr. Overton at 08/12/24 at 9am    Cathlyn July, NP [facility]  07/25/2024  1:04 PM

## 2024-07-26 ENCOUNTER — Inpatient Hospital Stay (HOSPITAL_COMMUNITY)

## 2024-07-26 DIAGNOSIS — T879 Unspecified complications of amputation stump: Secondary | ICD-10-CM | POA: Diagnosis not present

## 2024-07-26 DIAGNOSIS — M7989 Other specified soft tissue disorders: Secondary | ICD-10-CM | POA: Diagnosis not present

## 2024-07-26 LAB — TYPE AND SCREEN
ABO/RH(D): AB POS
Antibody Screen: NEGATIVE
Unit division: 0

## 2024-07-26 LAB — COMPREHENSIVE METABOLIC PANEL WITH GFR
ALT: 20 U/L (ref 0–44)
AST: 21 U/L (ref 15–41)
Albumin: 1.5 g/dL — ABNORMAL LOW (ref 3.5–5.0)
Alkaline Phosphatase: 115 U/L (ref 38–126)
Anion gap: 8 (ref 5–15)
BUN: 18 mg/dL (ref 6–20)
CO2: 23 mmol/L (ref 22–32)
Calcium: 7.7 mg/dL — ABNORMAL LOW (ref 8.9–10.3)
Chloride: 105 mmol/L (ref 98–111)
Creatinine, Ser: 1.27 mg/dL — ABNORMAL HIGH (ref 0.44–1.00)
GFR, Estimated: 56 mL/min — ABNORMAL LOW (ref >60)
Glucose, Bld: 97 mg/dL (ref 70–99)
Potassium: 3.6 mmol/L (ref 3.5–5.1)
Sodium: 136 mmol/L (ref 135–145)
Total Bilirubin: 0.3 mg/dL (ref 0.0–1.2)
Total Protein: 6.8 g/dL (ref 6.5–8.1)

## 2024-07-26 LAB — GLUCOSE, CAPILLARY
Glucose-Capillary: 92 mg/dL (ref 70–99)
Glucose-Capillary: 108 mg/dL — ABNORMAL HIGH (ref 70–99)
Glucose-Capillary: 192 mg/dL — ABNORMAL HIGH (ref 70–99)
Glucose-Capillary: 192 mg/dL — ABNORMAL HIGH (ref 70–99)

## 2024-07-26 LAB — MAGNESIUM: Magnesium: 1.6 mg/dL — ABNORMAL LOW (ref 1.7–2.4)

## 2024-07-26 LAB — CBC
HCT: 26.2 % — ABNORMAL LOW (ref 36.0–46.0)
Hemoglobin: 8.6 g/dL — ABNORMAL LOW (ref 12.0–15.0)
MCH: 28.9 pg (ref 26.0–34.0)
MCHC: 32.8 g/dL (ref 30.0–36.0)
MCV: 87.9 fL (ref 80.0–100.0)
Platelets: 787 K/uL — ABNORMAL HIGH (ref 150–400)
RBC: 2.98 MIL/uL — ABNORMAL LOW (ref 3.87–5.11)
RDW: 13.2 % (ref 11.5–15.5)
WBC: 22 K/uL — ABNORMAL HIGH (ref 4.0–10.5)
nRBC: 0 % (ref 0.0–0.2)

## 2024-07-26 LAB — BPAM RBC
Blood Product Expiration Date: 202511092359
ISSUE DATE / TIME: 202510241646
Unit Type and Rh: 8400

## 2024-07-26 MED ORDER — ENOXAPARIN SODIUM 40 MG/0.4ML IJ SOSY
40.0000 mg | PREFILLED_SYRINGE | INTRAMUSCULAR | Status: DC
  Administered 2024-07-26 – 2024-07-27 (×2): 40 mg via SUBCUTANEOUS
  Filled 2024-07-26: qty 0.4

## 2024-07-26 MED ORDER — MAGNESIUM SULFATE 2 GM/50ML IV SOLN
2.0000 g | Freq: Once | INTRAVENOUS | Status: AC
  Administered 2024-07-26: 2 g via INTRAVENOUS
  Filled 2024-07-26: qty 50

## 2024-07-26 MED ORDER — PROCHLORPERAZINE MALEATE 5 MG PO TABS
5.0000 mg | ORAL_TABLET | Freq: Four times a day (QID) | ORAL | Status: DC | PRN
Start: 2024-07-26 — End: 2024-07-27
  Administered 2024-07-26: 5 mg via ORAL
  Filled 2024-07-26 (×2): qty 1

## 2024-07-26 NOTE — Progress Notes (Signed)
 Physical Therapy Treatment Patient Details Name: [name] MRN: [id number] DOB: 1986/07/17 Today's Date: 07/26/2024   History of Present Illness [name] is a 38 y.o. female admitted 07/19/24 with working diagnosis of sepsis due to right BKA cellulitis/osteomyelitis. Pt presented with swelling and purulent drainage. Pt s/p R BKA revision 10/22. Pt pending CXR and RUE doppler due to PICC line issues/RUE edema 10/25. PMHx: HTN and poorly controlled T2DM.    PT Comments  Pt received in supine, c/o ongoing nausea but attempting to eat lunch, pt agreeable to discussion on planning for next session OOB mobility, drop arm recliner brought to her room and unit secretary asked to order drop arm BSC for hospital room for ease of toileting transfers. Pt with good participation in RLE HEP instruction, amputee HEP handout brought to her room for carryover of instruction. Pt pending RUE doppler and CXR so pt defer OOB due to trying to eat lunch and recent nausea prior to eating. Pt agreeable to wheelchair mobility and transfer training next session, once doppler results in. Pt continues to benefit from PT services to progress toward functional mobility goals continue to recommend HHPT if available, if not need to consider OPPT.    If plan is discharge home, recommend the following: A little help with walking and/or transfers;A little help with bathing/dressing/bathroom;Assistance with cooking/housework;Assist for transportation;Help with stairs or ramp for entrance   Can travel by private vehicle        Equipment Recommendations  None recommended by PT (Pt already has DME, need to ensure she has drop arm BSC at home)    Recommendations for Other Services       Precautions / Restrictions Precautions Precautions: Fall Recall of Precautions/Restrictions: Intact Precaution/Restrictions Comments: Wound Vac RLE, RUE edema around her PICC line Restrictions Weight Bearing Restrictions Per Provider  Order: Yes RLE Weight Bearing Per Provider Order: Non weight bearing Other Position/Activity Restrictions: pt has RLE limb guard     Mobility  Bed Mobility Overal bed mobility: Needs Assistance             General bed mobility comments: pt defer due to eating lunch and pending RUE doppler and CXR from RUE edema/picc line issues, persistent nausea today    Transfers Overall transfer level: Needs assistance                 General transfer comment: Discussion on plan for next session, PTA brought drop arm recliner to her room and set it up for next session or if staff can get her OOB to chair later in day after imaging done.    Ambulation/Gait               General Gait Details: defer until next session   Stairs             Wheelchair Mobility     Tilt Bed    Modified Rankin (Stroke Patients Only)       Balance Overall balance assessment: Needs assistance Sitting-balance support: Single extremity supported, Feet supported Sitting balance-Leahy Scale: Fair Sitting balance - Comments: brief long sitting, pt defer EOB/OOB       Standing balance comment: defer -UE imaging pending                            Communication Communication Communication: No apparent difficulties  Cognition Arousal: Alert Behavior During Therapy: WFL for tasks assessed/performed   PT - Cognitive impairments: No apparent impairments  PT - Cognition Comments: Pt A&Ox4 Following commands: Intact      Cueing Cueing Techniques: Verbal cues, Gestural cues, Visual cues  Exercises Amputee Exercises Quad Sets: AROM, Right, 5 reps, Supine Gluteal Sets: AROM, 5 reps, Supine Towel Squeeze: AROM, Both, 5 reps, Supine (a few reps for teachback, 5 sec hold) Hip ABduction/ADduction: Other (comment) (verbal/visual demo, handout, PTA demo sidelying ABD and ADduction if pt able to tolerate later with assist for line mgmt) Hip  Flexion/Marching:  (verbal/visual demo, handout) Knee Flexion: AROM, Right, 5 reps, Supine (verbal/visual demo, handout) Straight Leg Raises: AROM, Right, 5 reps, Supine Chair Push Up:  (verbal/visual demo, handout, PTA defer at this time due to pt tending RUE doppler to R/O DVT, pt agreeable to try later if doppler negative)    General Comments General comments (skin integrity, edema, etc.): VSS on RA  checked at end of session, no acute s/sx distress other than c/o nausea, pt has emesis bags at bedside PRN      Pertinent Vitals/Pain Pain Assessment Pain Assessment: No/denies pain Pain Intervention(s): Monitored during session, Repositioned    Home Living                          Prior Function            PT Goals (current goals can now be found in the care plan section) Acute Rehab PT Goals Patient Stated Goal: To return home PT Goal Formulation: With patient Time For Goal Achievement: 08/08/24 Progress towards PT goals: Progressing toward goals    Frequency    Min 2X/week      PT Plan      Co-evaluation              AM-PAC PT 6 Clicks Mobility   Outcome Measure  Help needed turning from your back to your side while in a flat bed without using bedrails?: A Little Help needed moving from lying on your back to sitting on the side of a flat bed without using bedrails?: A Little Help needed moving to and from a bed to a chair (including a wheelchair)?: A Little Help needed standing up from a chair using your arms (e.g., wheelchair or bedside chair)?: A Little Help needed to walk in hospital room?: Total Help needed climbing 3-5 steps with a railing? : Total 6 Click Score: 14    End of Session Equipment Utilized During Treatment: Other (comment) (pt keeping limb guard off at rest, states she can don on her own for OOB) Activity Tolerance: Patient tolerated treatment well;Other (comment) (nausea ongoing) Patient left: with call bell/phone within  reach;in bed;with bed alarm set Nurse Communication: Mobility status;Other (comment) (pt may agree to OOB later after getting RUE doppler/CXR) PT Visit Diagnosis: Difficulty in walking, not elsewhere classified (R26.2);Other abnormalities of gait and mobility (R26.89);Unsteadiness on feet (R26.81)     Time: 1339-1400 PT Time Calculation (min) (ACUTE ONLY): 21 min  Charges:    $Therapeutic Exercise: 8-22 mins PT General Charges $$ ACUTE PT VISIT: 1 Visit                     Katianna Mcclenney P., PTA Acute Rehabilitation Services Secure Chat Preferred 9a-5:30pm Office: [phone]    Connell HERO [facility] 07/26/2024, 2:35 PM

## 2024-07-26 NOTE — Progress Notes (Signed)
 C/O pain and swelling in right arm, pt believes pain and swelling related to PICC line, x-ray ordered to determine line position and surrounding soft tissue.

## 2024-07-26 NOTE — Progress Notes (Signed)
 Patient noted to have swelling in right arm this morning. IV fluids stopped. MD made aware and IV team consult placed to assess PICC. IV team came to assess but was unable to determine if PICC was okay to use or not. Recommended x-ray. MD made aware. PIV access attempted by nursing. IV team states they attempted PIV as well - unsuccessful.

## 2024-07-26 NOTE — Progress Notes (Signed)
 IV team received another consult to evaluate the picc , which was placed 07-24-24; cxr from today states tip is excluded from view; may want to repeat cxr? Have also recommended a doppler study;  Right arm is more edematous than the left; pt says she is unable to lift her arm or rotate her shoulder; PICC has great blood return and flushes easily; RN and MD aware;

## 2024-07-26 NOTE — Progress Notes (Signed)
 VASCULAR LAB    Right upper extremity venous duplex has been performed.  See CV proc for preliminary results.   Kanita Delage, RVT 07/26/2024, 3:18 PM

## 2024-07-26 NOTE — Progress Notes (Signed)
 PROGRESS NOTE  [name] FMW:[id number] DOB: [birth year] DOA: 07/19/2024 PCP: [name] [name], NP   LOS: 6 days   Brief Narrative / Interim history: 38 y.o. female with PMH of IDDM-2, HTN and Rt BKA wound presenting with increasing right BKA swelling and purulent drainage, and admitted with working diagnosis of sepsis due to right BKA cellulitis/osteomyelitis.  Imaging with MRI showed large complex fluid collection containing multiple air bubbles within soft tissue of the distal stump suspicious for abscess and underlying gas-forming infection, possible early distal tibial osteomyelitis, possible patellar bursitis.  Orthopedic surgery consulted  Subjective / 24h Interval events: She complains of pain into her right upper arm, also some swelling in her right hand.  She tells me she has difficulties raising the right arm above the shoulder plane  Assesement and Plan: Principal problem Sepsis due to right BKA osteomyelitis and abscess -met sepsis criteria on admission with fever, white count, source.  Has been placed on antibiotics, continue.  Orthopedic surgery consulted, she was taken to the OR 10/22 status post I&D/revision.  Blood cultures are without growth, intraoperative cultures are growing GPC's - ID consulted, discussed with Dr. Lindia, appreciate input, she will need 4 weeks of IV antibiotics, PICC line was placed  Active problems Right arm pain, swelling -this is the arm where PICC line was placed.  I do have some concerns about DVT given pain and swelling, obtain ultrasound  DM 2, poorly controlled, with hyperglycemia-continue glargine, sliding scale.   CBG (last 3)  Recent Labs    07/25/24 2121 07/26/24 0604 07/26/24 1113  GLUCAP 97 92 108*   Nausea, vomiting-possible gastroparesis.  This is intermittent  AKI-creatinine overall stable, strong suspicion that she may have a degree of CKD given her poorly controlled diabetes.  Creatinine down to 1.2  today  Hypokalemia-potassium stable  Hypomagnesemia-replenish magnesium   Acute blood loss anemia-postoperatively, hemoglobin down to 7.7.  No bleeding, continue to closely monitor  Leukocytosis-still quite elevated, monitor  Acute blood loss anemia-postoperatively, hemoglobin trending down and is 7.3 today.  Will transfuse unit of packed red blood cells given trend  Essential hypertension-continue Procardia   Elevated LFTs-resolved  Obesity, class I-BMI 33  Scheduled Meds:  enoxaparin  (LOVENOX ) injection  40 mg Subcutaneous Q24H   influenza vac split trivalent PF  0.5 mL Intramuscular Tomorrow-1000   insulin  aspart  0-15 Units Subcutaneous TID WC   insulin  aspart  0-5 Units Subcutaneous QHS   insulin  aspart  7 Units Subcutaneous TID WC   insulin  glargine-yfgn  30 Units Subcutaneous QHS   metoCLOPramide  (REGLAN ) injection  5 mg Intravenous TID AC & HS   NIFEdipine   30 mg Oral Daily   pneumococcal 20-valent conjugate vaccine  0.5 mL Intramuscular Tomorrow-1000   sodium chloride  flush  10-40 mL Intracatheter Q12H   Continuous Infusions:   ceFAZolin  (ANCEF ) IV 2 g (07/25/24 2136)   PRN Meds:.acetaminophen , HYDROmorphone (DILAUDID) injection, loperamide, ondansetron  (ZOFRAN ) IV, oxyCODONE , prochlorperazine , sodium chloride  flush  Current Outpatient Medications  Medication Instructions   acetaminophen  (TYLENOL ) 1,000 mg, Every 6 hours PRN   azithromycin  (ZITHROMAX ) 250 mg, Oral, As directed   Lantus  SoloStar 15 Units, Subcutaneous, Daily at bedtime, Patient is taking only 10 unit   NIFEdipine  (ADALAT  CC) 30 mg, Oral, Daily   NovoLOG  FlexPen 5 Units, Subcutaneous, 3 times daily with meals, Also can use sliding scale if needed. Patient is taking 3 U at dinner and 5 unit Breakfast and lunch   ondansetron  (ZOFRAN -ODT) 4 mg, Oral, Every 6 hours PRN  Diet Orders (From admission, onward)     Start     Ordered   07/23/24 1504  Diet Carb Modified Fluid consistency: Thin; Room  service appropriate? Yes  Diet effective now       Question Answer Comment  Diet-HS Snack? Nothing   Calorie Level Medium 1600-2000   Fluid consistency: Thin   Room service appropriate? Yes      07/23/24 1503            DVT prophylaxis: enoxaparin  (LOVENOX ) injection 40 mg Start: 07/26/24 1015   Lab Results  Component Value Date   PLT 787 (H) 07/26/2024      Code Status: Full Code  Family Communication: No family at bedside  Status is: Inpatient Remains inpatient appropriate because: Awaiting intraoperative cultures   Level of care: Telemetry Surgical  Consultants:  Orthopedic surgery  Objective: Vitals:   07/25/24 1910 07/25/24 2020 07/26/24 0409 07/26/24 0738  BP: (!) 146/92 123/80 (!) 152/90 133/85  Pulse: (!) 104 (!) 102 97 (!) 103  Resp: 18 15 18 17   Temp: 98.2 F (36.8 C) 98.4 F (36.9 C) 98.6 F (37 C) 99.2 F (37.3 C)  TempSrc: Oral Oral Oral Oral  SpO2: 100% 99% 100% 100%  Weight:      Height:        Intake/Output Summary (Last 24 hours) at 07/26/2024 1348 Last data filed at 07/26/2024 0834 Gross per 24 hour  Intake 674 ml  Output 50 ml  Net 624 ml   Wt Readings from Last 3 Encounters:  07/20/24 97.1 kg  03/15/23 86.6 kg  03/13/23 86.6 kg    Examination:  Constitutional: NAD Eyes: lids and conjunctivae normal, no scleral icterus ENMT: mmm Neck: normal, supple Respiratory: clear to auscultation bilaterally, no wheezing, no crackles.  Cardiovascular: Regular rate and rhythm, no murmurs / rubs / gallops. No LE edema. Abdomen: soft, no distention, no tenderness. Bowel sounds positive.   Data Reviewed: I have independently reviewed following labs and imaging studies   CBC Recent Labs  Lab 07/22/24 1011 07/23/24 0537 07/24/24 0401 07/25/24 0500 07/26/24 0641  WBC 18.8* 17.0* 21.3* 19.7* 22.0*  HGB 10.0* 9.4* 7.7* 7.3* 8.6*  HCT 30.7* 29.4* 23.6* 22.6* 26.2*  PLT 594* 624* 674* 712* 787*  MCV 88.0 88.8 87.1 89.7 87.9  MCH  28.7 28.4 28.4 29.0 28.9  MCHC 32.6 32.0 32.6 32.3 32.8  RDW 13.1 12.9 13.0 13.2 13.2    Recent Labs  Lab 07/19/24 2021 07/19/24 2022 07/19/24 2022 07/20/24 0650 07/20/24 1216 07/21/24 0640 07/22/24 1011 07/23/24 0537 07/23/24 2352 07/24/24 0401 07/25/24 0500 07/26/24 0641  NA  --  131*   < > 134*  --  131* 133* 134*  --  131* 136 136  K  --  3.4*   < > 3.5  --  4.1 3.7 3.5  --  4.0 3.5 3.6  CL  --  95*   < > 98  --  97* 102 105  --  100 105 105  CO2  --  24   < > 25  --  24 21* 20*  --  20* 22 23  GLUCOSE  --  154*   < > 183*  --  219* 144* 183* 458* 373* 148* 97  BUN  --  20   < > 21*  --  28* 22* 17  --  28* 25* 18  CREATININE  --  1.47*   < > 1.49*  --  2.25* 1.73*  1.49*  --  1.73* 1.61* 1.27*  CALCIUM   --  8.3*   < > 7.9*  --  7.9* 8.0* 7.9*  --  7.6* 7.6* 7.7*  AST  --  90*  --  31  --  32  --   --   --  17  --  21  ALT  --  62*  --  45*  --  34  --   --   --  25  --  20  ALKPHOS  --  157*  --  125  --  139*  --   --   --  150*  --  115  BILITOT  --  0.8  --  0.9  --  0.8  --   --   --  0.4  --  0.3  ALBUMIN  --  1.6*   < > <1.5*  --  <1.5* <1.5* <1.5*  --  <1.5*  --  1.5*  MG  --   --    < > 1.9  --  1.8 2.1 2.0  --  2.0 1.9 1.6*  CRP  --   --   --   --  31.2* 23.1*  --   --   --   --   --   --   LATICACIDVEN 0.9  --   --   --   --   --   --   --   --   --   --   --   HGBA1C  --   --   --   --  15.2*  --   --   --   --   --   --   --    < > = values in this interval not displayed.    ------------------------------------------------------------------------------------------------------------------ No results for input(s): CHOL, HDL, LDLCALC, TRIG, CHOLHDL, LDLDIRECT in the last 72 hours.  Lab Results  Component Value Date   HGBA1C 15.2 (H) 07/20/2024   ------------------------------------------------------------------------------------------------------------------ No results for input(s): TSH, T4TOTAL, T3FREE, THYROIDAB in the last 72  hours.  Invalid input(s): FREET3  Cardiac Enzymes No results for input(s): CKMB, TROPONINI, MYOGLOBIN in the last 168 hours.  Invalid input(s): CK ------------------------------------------------------------------------------------------------------------------ No results found for: BNP  CBG: Recent Labs  Lab 07/25/24 1142 07/25/24 1559 07/25/24 2121 07/26/24 0604 07/26/24 1113  GLUCAP 182* 123* 97 92 108*    Recent Results (from the past 240 hours)  Blood culture (routine x 2)     Status: None   Collection Time: 07/19/24  8:23 PM   Specimen: BLOOD RIGHT HAND  Result Value Ref Range Status   Specimen Description BLOOD RIGHT HAND  Final   Special Requests   Final    BOTTLES DRAWN AEROBIC AND ANAEROBIC Blood Culture results may not be optimal due to an inadequate volume of blood received in culture bottles   Culture   Final    NO GROWTH 5 DAYS Performed at [facility], [address] [place], [place] [address]    Report Status 07/24/2024 FINAL  Final  Blood culture (routine x 2)     Status: None   Collection Time: 07/19/24  8:23 PM   Specimen: BLOOD LEFT HAND  Result Value Ref Range Status   Specimen Description BLOOD LEFT HAND  Final   Special Requests   Final    BOTTLES DRAWN AEROBIC ONLY Blood Culture results may not be optimal due to an inadequate volume of blood received in  culture bottles   Culture   Final    NO GROWTH 5 DAYS Performed at [facility], [address], [place], [place] [address]    Report Status 07/24/2024 FINAL  Final  MRSA Next Gen by PCR, Nasal     Status: None   Collection Time: 07/23/24  5:58 AM   Specimen: Nasal Mucosa; Nasal Swab  Result Value Ref Range Status   MRSA by PCR Next Gen NOT DETECTED NOT DETECTED Final    Comment: (NOTE) The GeneXpert MRSA Assay (FDA approved for NASAL specimens only), is one component of a comprehensive MRSA colonization surveillance program. It is not intended to diagnose MRSA  infection nor to guide or monitor treatment for MRSA infections. Test performance is not FDA approved in patients less than 31 years old. Performed at [facility], [address] [place], [place] [address]   Aerobic/Anaerobic Culture w Gram Stain (surgical/deep wound)     Status: None (Preliminary result)   Collection Time: 07/23/24  1:22 PM   Specimen: Soft Tissue, Other  Result Value Ref Range Status   Specimen Description TISSUE  Final   Special Requests ABSCESS RIGHT BELOW KNEE AMPUTATION  Final   Gram Stain   Final    FEW WBC PRESENT, PREDOMINANTLY PMN RARE GRAM POSITIVE COCCI Performed at [facility], [address] [place], [place] [address]    Culture   Final    ABUNDANT STREPTOCOCCUS AGALACTIAE TESTING AGAINST S. AGALACTIAE NOT ROUTINELY PERFORMED DUE TO PREDICTABILITY OF AMP/PEN/VAN SUSCEPTIBILITY. NO ANAEROBES ISOLATED; CULTURE IN PROGRESS FOR 5 DAYS    Report Status PENDING  Incomplete     Radiology Studies: DG Humerus Right Result Date: 07/26/2024 EXAM: 1 VIEW XRAY OF THE RIGHT HUMERUS 07/26/2024 06:52:34 AM COMPARISON: None available. CLINICAL HISTORY: Complication associated with peripherally inserted central catheter (PICC) [id number]; [id number] Swelling of arm [id number]. Per chart - Complication associated with peripherally inserted central catheter (PICC). FINDINGS: BONES AND JOINTS: No acute fracture. No focal osseous lesion. No joint dislocation. SOFT TISSUES: Right upper extremity PICC line in place. The PICC line courses towards the midline of the chest and is excluded from the field of view. Subjective edema about the right upper extremity. IMPRESSION: 1. Right upper extremity PICC line present which courses toward the midline, distal tip excluded from the field of view. 2. No acute osseous findings. Electronically signed by: Waddell Calk MD 07/26/2024 07:40 AM EDT RP Workstation: [id number]     Nilda Fendt, MD, PhD [facility]  Between 7 am  - 7 pm I am available, please contact me via Amion (for emergencies) or Securechat (non urgent messages)  Between 7 pm - 7 am I am not available, please contact night coverage MD/APP via Amion

## 2024-07-26 NOTE — Progress Notes (Signed)
 Patient ID: [name], female   DOB: Oct 07, 1985, 38 y.o.   MRN: [id number] Patient is status post debridement of transtibial amputation abscess.  There is 150 cc in the wound VAC canister.  Patient will discharge with the Prevena plus portable wound VAC pump.  The deep wound cultures are showing staph that should be pansensitive.  Anticipate she can be discharged with the Prevena plus portable wound VAC pump orders placed.  And could discharge on Augmentin  for 2 weeks.

## 2024-07-27 DIAGNOSIS — T879 Unspecified complications of amputation stump: Secondary | ICD-10-CM | POA: Diagnosis not present

## 2024-07-27 LAB — GLUCOSE, CAPILLARY
Glucose-Capillary: 148 mg/dL — ABNORMAL HIGH (ref 70–99)
Glucose-Capillary: 93 mg/dL (ref 70–99)

## 2024-07-27 MED ORDER — HEPARIN SOD (PORK) LOCK FLUSH 100 UNIT/ML IV SOLN
250.0000 [IU] | INTRAVENOUS | Status: AC | PRN
  Administered 2024-07-27: 250 [IU]

## 2024-07-27 MED ORDER — CEFAZOLIN IV (FOR PTA / DISCHARGE USE ONLY): 2.0000 g | Freq: Three times a day (TID) | INTRAVENOUS | 0 refills | Status: DC

## 2024-07-27 MED ORDER — OXYCODONE HCL 5 MG PO TABS: 5.0000 mg | ORAL_TABLET | Freq: Four times a day (QID) | ORAL | 0 refills | Status: DC | PRN

## 2024-07-27 NOTE — TOC Transition Note (Addendum)
 Transition of Care [facility]) - Discharge Note   Patient Details  Name: [name] MRN: [id number] Date of Birth: 1986/01/23  Transition of Care [facility]) CM/SW Contact:  Robynn Eileen Hoose, RN Phone Number: 07/27/2024, 9:16 AM   Clinical Narrative:   Patient is being discharged. Pam with [employer] made aware. Meds scheduled for home delivery between 2-4pm.    Final next level of care: Home w Home Health Services Barriers to Discharge: No Barriers Identified   Patient Goals and CMS Choice     Choice offered to / list presented to : Patient      Discharge Placement                       Discharge Plan and Services Additional resources added to the After Visit Summary for     Discharge Planning Services: CM Consult            DME Arranged: Other see comment (IV ABX therapy)   Date DME Agency Contacted: 07/24/24 Time DME Agency Contacted: [phone] Representative spoke with at DME Agency: PAM HH Arranged: RN HH Agency: Other - See comment ([facility]) Date HH Agency Contacted: 07/24/24 Time HH Agency Contacted: [phone] Representative spoke with at [facility] Agency: Pam  Social Drivers of Health (SDOH) Interventions SDOH Screenings   Food Insecurity: No Food Insecurity (07/20/2024)  Housing: Low Risk  (07/20/2024)  Transportation Needs: No Transportation Needs (07/20/2024)  Utilities: Not At Risk (07/20/2024)  Alcohol Screen: Low Risk  (03/16/2023)  Depression (PHQ2-9): Low Risk  (03/16/2023)  Financial Resource Strain: Low Risk  (03/16/2023)  Physical Activity: Insufficiently Active (03/16/2023)  Social Connections: Moderately Integrated (07/20/2024)  Stress: No Stress Concern Present (03/16/2023)  Tobacco Use: Low Risk  (07/23/2024)     Readmission Risk Interventions     No data to display

## 2024-07-27 NOTE — Progress Notes (Signed)
 This RN and Consulting Civil Engineer Angelito reviewed wound care  and AVS instructions with patient. Patient verbalized understanding.Patient now awaiting iv team for picc line home care instructions.

## 2024-07-27 NOTE — Progress Notes (Signed)
 Patient refused flu vaccine , states she will get it at primary MD office

## 2024-07-27 NOTE — Discharge Summary (Signed)
 Physician Discharge Summary  [name] FMW:[id number] DOB: 12/13/1985 DOA: 07/19/2024  PCP: [name] [name], NP  Admit date: 07/19/2024 Discharge date: 07/27/2024  Admitted From: home Disposition:  home  Recommendations for Outpatient Follow-up:  Follow up with PCP in 1-2 weeks Follow up with ID as scheduled   Home Health: RN Equipment/Devices: PICC line  Discharge Condition: stable CODE STATUS: Full code Diet Orders (From admission, onward)     Start     Ordered   07/23/24 1504  Diet Carb Modified Fluid consistency: Thin; Room service appropriate? Yes  Diet effective now       Question Answer Comment  Diet-HS Snack? Nothing   Calorie Level Medium 1600-2000   Fluid consistency: Thin   Room service appropriate? Yes      07/23/24 1503            Brief Narrative / Interim history: 38 y.o. female with PMH of IDDM-2, HTN and Rt BKA wound presenting with increasing right BKA swelling and purulent drainage, and admitted with working diagnosis of sepsis due to right BKA cellulitis/osteomyelitis.  Imaging with MRI showed large complex fluid collection containing multiple air bubbles within soft tissue of the distal stump suspicious for abscess and underlying gas-forming infection, possible early distal tibial osteomyelitis, possible patellar bursitis.  Orthopedic surgery consulted  Hospital Course / Discharge diagnoses: Principal problem Sepsis due to right BKA osteomyelitis and abscess -met sepsis criteria on admission with fever, white count, source.  Has been placed on antibiotics, continue.  Orthopedic surgery consulted, she was taken to the OR 10/22 status post I&D/revision.  Blood cultures are without growth, intraoperative cultures are growing Strep agalactiae.  ID consulted and followed patient while hospitalized, she will be placed on cefazolin  for 4 additional weeks and will have outpatient follow-up with ID.  She has a PICC line in place  Active  problems Right arm pain, swelling -this is the arm where PICC line was placed.  Upper extremity Dopplers negative for DVT, x-rays unremarkable.  Resolved  DM 2, poorly controlled, with hyperglycemia-continue home regimen Nausea, vomiting-possible gastroparesis.  This is intermittent, now improved  AKI-creatinine overall stable, strong suspicion that she may have a degree of CKD given her poorly controlled diabetes.  Time will tell, follow-up as an outpatient Hypokalemia, Hypomagnesemia-replaced Acute blood loss anemia-postoperatively, she was transfusion dependent blood cells  Leukocytosis-elevated postoperatively, anticipate he will get better.  Clinically improving Essential hypertension-continue home regimen Elevated LFTs-resolved Obesity, class I-BMI 33   Discharge Instructions  Discharge Instructions     [facility] pharmacist to adjust dose for Vancomycin, Aminoglycosides and other anti-infective therapies as requested by physician.   Complete by: As directed    [facility] to provide Cath Flo 2mg    Complete by: As directed    Administer for PICC line occlusion and as ordered by physician for other access device issues.   Anaphylaxis Kit: Provided to treat any anaphylactic reaction to the medication being provided to the patient if First Dose or when requested by physician   Complete by: As directed    Epinephrine  1mg /ml vial / amp: Administer 0.3mg  (0.70ml) subcutaneously once for moderate to severe anaphylaxis, nurse to call physician and pharmacy when reaction occurs and call 911 if needed for immediate care   Diphenhydramine  50mg /ml IV vial: Administer 25-50mg  IV/IM PRN for first dose reaction, rash, itching, mild reaction, nurse to call physician and pharmacy when reaction occurs   Sodium Chloride  0.9% NS 500ml IV: Administer if needed  for hypovolemic blood pressure drop or as ordered by physician after call to physician with anaphylactic reaction   Change  dressing on IV access line weekly and PRN   Complete by: As directed    Flush IV access with Sodium Chloride  0.9% and Heparin 10 units/ml or 100 units/ml   Complete by: As directed    Home infusion instructions - [facility]   Complete by: As directed    Instructions: Flush IV access with Sodium Chloride  0.9% and Heparin 10units/ml or 100units/ml   Change dressing on IV access line: Weekly and PRN   Instructions Cath Flo 2mg : Administer for PICC Line occlusion and as ordered by physician for other access device   [facility] pharmacist to adjust dose for: Vancomycin, Aminoglycosides and other anti-infective therapies as requested by physician   Method of administration may be changed at the discretion of home infusion pharmacist based upon assessment of the patient and/or caregiver's ability to self-administer the medication ordered   Complete by: As directed    Negative Pressure Wound Therapy - Incisional   Complete by: As directed    Attach the wound VAC dressing to a Prevena plus portable wound VAC pump at time of discharge.      Allergies as of 07/27/2024       Reactions   Almond (diagnostic) Hives   Almond Oil Hives   Bee Venom Hives   Limonene Hives, Nausea And Vomiting   Chlorhexidine Rash   Unable to use CHG wipes        Medication List     STOP taking these medications    azithromycin  250 MG tablet Commonly known as: ZITHROMAX        TAKE these medications    acetaminophen  500 MG tablet Commonly known as: TYLENOL  Take 1,000 mg by mouth every 6 (six) hours as needed for mild pain (pain score 1-3), headache or fever.   ceFAZolin  IVPB Commonly known as: ANCEF  Inject 2 g into the vein every 8 (eight) hours for 27 days. Indication:  osteomyelitis  First Dose: Yes Last Day of Therapy:  08/20/2024 Labs - Once weekly:  CBC/D and BMP, Labs - Once weekly: ESR and CRP Method of administration: IV Push Method of administration may be changed  at the discretion of home infusion pharmacist based upon assessment of the patient and/or caregiver's ability to self-administer the medication ordered.   Lantus  SoloStar 100 UNIT/ML Solostar Pen Generic drug: insulin  glargine Inject 15 Units into the skin at bedtime. Patient is taking only 10 unit What changed:  how much to take additional instructions   NIFEdipine  30 MG 24 hr tablet Commonly known as: ADALAT  CC Take 1 tablet (30 mg total) by mouth daily.   NovoLOG  FlexPen 100 UNIT/ML FlexPen Generic drug: insulin  aspart Inject 5 Units into the skin 3 (three) times daily with meals. Also can use sliding scale if needed. Patient is taking 3 U at dinner and 5 unit Breakfast and lunch What changed: how much to take   ondansetron  4 MG disintegrating tablet Commonly known as: ZOFRAN -ODT Take 4 mg by mouth every 6 (six) hours as needed.   oxyCODONE  5 MG immediate release tablet Commonly known as: Oxy IR/ROXICODONE  Take 1-2 tablets (5-10 mg total) by mouth every 6 (six) hours as needed for moderate pain (pain score 4-6).               Discharge Care Instructions  (From admission, onward)  Start     Ordered   07/27/24 0000  Change dressing on IV access line weekly and PRN  (Home infusion instructions - [facility] )        07/27/24 0736            Follow-up Information     [facility], NP Follow up.   Specialties: Nurse Practitioner, Family Medicine Contact information: [address] [place] [address] [phone]         Harden Jerona GAILS, MD Follow up in 1 week(s).   Specialty: Orthopedic Surgery Contact information: [address] [place] [address] [place] [address] [phone]         [facility] Follow up.   Why: Home antibiotic infusion therapy and HHRN will be provided by [facility] Contact information: [address], [place], [place] [address] Phone: [phone] Hours:  Open  Closes 5 PM                Consultations:   Procedures/Studies:  VAS US  UPPER EXTREMITY VENOUS DUPLEX Result Date: 07/26/2024 UPPER VENOUS STUDY  Patient Name:  [name]  Date of Exam:   07/26/2024 Medical Rec #: [id number]      Accession #:    [id number] Date of Birth: [birth year]     Patient Gender: F Patient Age:   38 years Exam Location:  [facility] Procedure:      VAS US  UPPER EXTREMITY VENOUS DUPLEX Referring Phys: Chevy Virgo --------------------------------------------------------------------------------  Indications: Pain, Swelling, and Right PICC line Limitations: Bandages, line and poor ultrasound/tissue interface. Comparison Study: No prior study on file Performing Technologist: Alberta Lis RVS  Examination Guidelines: A complete evaluation includes B-mode imaging, spectral Doppler, color Doppler, and power Doppler as needed of all accessible portions of each vessel. Bilateral testing is considered an integral part of a complete examination. Limited examinations for reoccurring indications may be performed as noted.  Right Findings: +----------+------------+---------+-----------+----------+--------------+ RIGHT     CompressiblePhasicitySpontaneousProperties   Summary     +----------+------------+---------+-----------+----------+--------------+ IJV           Full       Yes       Yes                             +----------+------------+---------+-----------+----------+--------------+ Subclavian               Yes       Yes                             +----------+------------+---------+-----------+----------+--------------+ Axillary                 Yes       Yes                             +----------+------------+---------+-----------+----------+--------------+ Brachial                 Yes       Yes                             +----------+------------+---------+-----------+----------+--------------+ Radial         Full                                                 +----------+------------+---------+-----------+----------+--------------+  Ulnar         Full                                                 +----------+------------+---------+-----------+----------+--------------+ Cephalic                                            Not visualized +----------+------------+---------+-----------+----------+--------------+ Basilic                                             Not visualized +----------+------------+---------+-----------+----------+--------------+  Left Findings: +----------+------------+---------+-----------+----------+-------+ LEFT      CompressiblePhasicitySpontaneousPropertiesSummary +----------+------------+---------+-----------+----------+-------+ Subclavian               Yes       Yes                      +----------+------------+---------+-----------+----------+-------+  Summary:  Right: No obvious evidence of deep vein thrombosis in the visualized veins of the upper extremity.  Left: No evidence of thrombosis in the subclavian.  *See table(s) above for measurements and observations.    Preliminary    DG CHEST PORT 1 VIEW Result Date: 07/26/2024 CLINICAL DATA:  Pain. EXAM: PORTABLE CHEST 1 VIEW COMPARISON:  Chest radiograph 03/15/2023 FINDINGS: Tip of the right upper extremity PICC in the upper SVC. The cardiomediastinal contours are normal. Subsegmental linear atelectasis at the right lung base. Lung volumes are low. Pulmonary vasculature is normal. No consolidation, pleural effusion, or pneumothorax. No acute osseous abnormalities are seen. IMPRESSION: Low lung volumes with subsegmental linear atelectasis at the right lung base. Electronically Signed   By: Andrea Gasman M.D.   On: 07/26/2024 17:38   DG Humerus Right Result Date: 07/26/2024 EXAM: 1 VIEW XRAY OF THE RIGHT HUMERUS 07/26/2024 06:52:34 AM COMPARISON: None available. CLINICAL HISTORY:  Complication associated with peripherally inserted central catheter (PICC) [id number]; [id number] Swelling of arm [id number]. Per chart - Complication associated with peripherally inserted central catheter (PICC). FINDINGS: BONES AND JOINTS: No acute fracture. No focal osseous lesion. No joint dislocation. SOFT TISSUES: Right upper extremity PICC line in place. The PICC line courses towards the midline of the chest and is excluded from the field of view. Subjective edema about the right upper extremity. IMPRESSION: 1. Right upper extremity PICC line present which courses toward the midline, distal tip excluded from the field of view. 2. No acute osseous findings. Electronically signed by: Waddell Calk MD 07/26/2024 07:40 AM EDT RP Workstation: [id number]   US  EKG SITE RITE Result Date: 07/24/2024 If Site Rite image not attached, placement could not be confirmed due to current cardiac rhythm.  VAS US  ABI WITH/WO TBI Result Date: 07/22/2024  LOWER EXTREMITY DOPPLER STUDY Patient Name:  DUAA STELZNER  Date of Exam:   07/22/2024 Medical Rec #: [id number]      Accession #:    [id number] Date of Birth: 08-29-1986     Patient Gender: F Patient Age:   49 years Exam Location:  [facility] Procedure:      VAS US  ABI WITH/WO TBI Referring Phys: DEEPAK RAMANATHAN --------------------------------------------------------------------------------  Indications: Peripheral artery disease. High Risk Factors: Hypertension, Diabetes.  Other Factors: 10/12/2021 - R BKA.  Comparison Study: No prior studies. Performing Technologist: Gerome Ny RVT  Examination Guidelines: A complete evaluation includes at minimum, Doppler waveform signals and systolic blood pressure reading at the level of bilateral brachial, anterior tibial, and posterior tibial arteries, when vessel segments are accessible. Bilateral testing is considered an integral part of a complete examination. Photoelectric Plethysmograph (PPG) waveforms and toe systolic  pressure readings are included as required and additional duplex testing as needed. Limited examinations for reoccurring indications may be performed as noted.  ABI Findings: +--------+------------------+-----+---------+--------+ Right   Rt Pressure (mmHg)IndexWaveform Comment  +--------+------------------+-----+---------+--------+ [id number]                    triphasic         +--------+------------------+-----+---------+--------+ PTA                                     BKA      +--------+------------------+-----+---------+--------+ DP                                      BKA      +--------+------------------+-----+---------+--------+ +---------+------------------+-----+-----------+-------+ Left     Lt Pressure (mmHg)IndexWaveform   Comment +---------+------------------+-----+-----------+-------+ Brachial 138                    triphasic          +---------+------------------+-----+-----------+-------+ PTA      200               1.35 multiphasic        +---------+------------------+-----+-----------+-------+ DP       186               1.26 multiphasic        +---------+------------------+-----+-----------+-------+ Great Toe137               0.93                    +---------+------------------+-----+-----------+-------+ +-------+-----------+-----------+------------+------------+ ABI/TBIToday's ABIToday's TBIPrevious ABIPrevious TBI +-------+-----------+-----------+------------+------------+ Right  BKA        BKA                                 +-------+-----------+-----------+------------+------------+ Left   1.35       0.93                                +-------+-----------+-----------+------------+------------+   Summary: Right:  Unable to obtain ABI/TBI due to below the knee amputation. Left: Resting left ankle-brachial index indicates noncompressible left lower extremity arteries. The left toe-brachial index is normal.  *See table(s)  above for measurements and observations.  Electronically signed by Norman Serve on 07/22/2024 at 11:33:30 AM.    Final    US  RENAL Result Date: 07/21/2024 CLINICAL DATA:  Acute kidney injury. EXAM: RENAL / URINARY TRACT ULTRASOUND COMPLETE COMPARISON:  None Available. FINDINGS: Right Kidney: Renal measurements: 13.7 x 4.9 x 6.6 cm = volume: 230 mL. Echogenicity within normal limits. No mass or hydronephrosis visualized. Left Kidney: Renal measurements: 12.2 x 5.0 x 6.2 cm = volume: 199 mL. Echogenicity within normal limits. No mass or hydronephrosis visualized. Bladder: Appears normal for degree of bladder distention. Other:  None. IMPRESSION: Normal renal ultrasound. Electronically Signed   By: Marcey Moan M.D.   On: 07/21/2024 16:32   MR TIBIA FIBULA RIGHT WO CONTRAST Result Date: 07/21/2024 CLINICAL DATA:  Soft tissue infection suspected, lower leg, xray done infection of R BKA stump History of below the knee amputation, presumably in 2022. Recent radiographic findings suspicious for gas-forming soft tissue infection in the below the knee amputation stump. EXAM: MRI OF LOWER RIGHT EXTREMITY WITHOUT CONTRAST TECHNIQUE: Multiplanar, multisequence MR imaging of the right lower leg was performed. No intravenous contrast was administered. COMPARISON:  Right knee radiographs 07/19/2024. Prior imaging from 2022 unavailable. Reports from CTs and radiographs of the right foot and ankle 09/18/2021. FINDINGS: Bones/Joint/Cartilage Status post below the knee amputation through the proximal tibial and fibular diaphyses. There is callus formation and heterotopic ossification along the amputation margin of the distal tibia with low level marrow T2 hyperintensity extending approximately 3.5 cm proximally into the tibial shaft. No suspicious marrow signal abnormalities within the remaining fibula. No significant marrow abnormalities identified within the visualized distal femur or patella. No significant knee joint  effusion. Ligaments The cruciate and collateral ligaments appear intact at the knee. Muscles and Tendons Diffuse fatty atrophy and mild T2 hyperintensity throughout the lower leg musculature, attributed to prior amputation. No focal intramuscular fluid collections are identified. Soft tissues As seen on recent radiographs, there are multiple air bubbles within the soft tissues of the distal stump, contained within a large complex fluid collection which measures approximately 8.8 x 7.9 x 4.5 cm. This collection abuts the tibial stump and is highly suspicious for an abscess and underlying gas-forming infection. There is proximal extension of an ill-defined fluid collection along the medial aspect of the distal tibial stump. In addition, there is separate ill-defined fluid within the prepatellar bursa, incompletely visualized. There is nonspecific prepatellar subcutaneous edema without other focal fluid collection. IMPRESSION: 1. Corresponding with the recent radiographic findings, there is a large complex fluid collection containing multiple air bubbles within the soft tissues of the distal stump, highly suspicious for an abscess and underlying gas-forming infection. 2. Low level marrow T2 hyperintensity within the distal tibia is nonspecific, but suspicious for early osteomyelitis. 3. Separate ill-defined fluid within the prepatellar bursa, incompletely visualized, consistent with bursitis. This could be infected as well. No significant joint effusion or evidence of osteomyelitis at the visualized knee. 4. Diffuse fatty atrophy and mild T2 hyperintensity throughout the lower leg musculature, attributed to prior amputation. Electronically Signed   By: Elsie Perone M.D.   On: 07/21/2024 09:29   DG Knee Complete 4 Views Right Result Date: 07/19/2024 EXAM: 4 OR MORE VIEW(S) XRAY OF THE RIGHT KNEE 07/19/2024 08:57:07 PM COMPARISON: None available. CLINICAL HISTORY: infected stump ?. from UC with c/o right leg wound  that has been ongoing for a month , pt states that the leg started swelling and has a fever today possible infection/ FINDINGS: BONES AND JOINTS: Status post below the knee amputation. Cortical irregularity and callus formation of the distal tibia, suspicious for osteomyelitis. No joint dislocation. No significant joint effusion. No significant degenerative changes. SOFT TISSUES: Soft tissue swelling, soft tissue gas in the distal stump, which may reflect soft tissue infection with gas-forming organism. IMPRESSION: 1. Soft tissue infection with gas-forming organism in the BKA stump and osteomyelitis of the distal tibia. Electronically signed by: Norman Gatlin MD 07/19/2024 09:12 PM EDT RP Workstation: [id number]     Subjective: - no chest pain, shortness of breath, no abdominal pain,  nausea or vomiting.   Discharge Exam: BP (!) 150/87 (BP Location: Left Arm)   Pulse 99   Temp 98.6 F (37 C) (Oral)   Resp 16   Ht 5' 7 (1.702 m)   Wt 97.1 kg   LMP 06/02/2024   SpO2 100%   BMI 33.52 kg/m   General: Pt is alert, awake, not in acute distress Cardiovascular: RRR, S1/S2 +, no rubs, no gallops Respiratory: CTA bilaterally, no wheezing, no rhonchi Abdominal: Soft, NT, ND, bowel sounds + Extremities: no edema, no cyanosis    The results of significant diagnostics from this hospitalization (including imaging, microbiology, ancillary and laboratory) are listed below for reference.     Microbiology: Recent Results (from the past 240 hours)  Blood culture (routine x 2)     Status: None   Collection Time: 07/19/24  8:23 PM   Specimen: BLOOD RIGHT HAND  Result Value Ref Range Status   Specimen Description BLOOD RIGHT HAND  Final   Special Requests   Final    BOTTLES DRAWN AEROBIC AND ANAEROBIC Blood Culture results may not be optimal due to an inadequate volume of blood received in culture bottles   Culture   Final    NO GROWTH 5 DAYS Performed at [facility], [address],  [place], [place] [address]    Report Status 07/24/2024 FINAL  Final  Blood culture (routine x 2)     Status: None   Collection Time: 07/19/24  8:23 PM   Specimen: BLOOD LEFT HAND  Result Value Ref Range Status   Specimen Description BLOOD LEFT HAND  Final   Special Requests   Final    BOTTLES DRAWN AEROBIC ONLY Blood Culture results may not be optimal due to an inadequate volume of blood received in culture bottles   Culture   Final    NO GROWTH 5 DAYS Performed at [facility], [address] [place], [place] [address]    Report Status 07/24/2024 FINAL  Final  MRSA Next Gen by PCR, Nasal     Status: None   Collection Time: 07/23/24  5:58 AM   Specimen: Nasal Mucosa; Nasal Swab  Result Value Ref Range Status   MRSA by PCR Next Gen NOT DETECTED NOT DETECTED Final    Comment: (NOTE) The GeneXpert MRSA Assay (FDA approved for NASAL specimens only), is one component of a comprehensive MRSA colonization surveillance program. It is not intended to diagnose MRSA infection nor to guide or monitor treatment for MRSA infections. Test performance is not FDA approved in patients less than 85 years old. Performed at [facility], [address] [place], [place] [address]   Aerobic/Anaerobic Culture w Gram Stain (surgical/deep wound)     Status: None (Preliminary result)   Collection Time: 07/23/24  1:22 PM   Specimen: Soft Tissue, Other  Result Value Ref Range Status   Specimen Description TISSUE  Final   Special Requests ABSCESS RIGHT BELOW KNEE AMPUTATION  Final   Gram Stain   Final    FEW WBC PRESENT, PREDOMINANTLY PMN RARE GRAM POSITIVE COCCI Performed at [facility], [address], [place], [place] [address]    Culture   Final    ABUNDANT STREPTOCOCCUS AGALACTIAE TESTING AGAINST S. AGALACTIAE NOT ROUTINELY PERFORMED DUE TO PREDICTABILITY OF AMP/PEN/VAN SUSCEPTIBILITY. NO ANAEROBES ISOLATED; CULTURE IN PROGRESS FOR 5 DAYS    Report Status PENDING  Incomplete      Labs: Basic Metabolic Panel: Recent Labs  Lab 07/22/24 1011 07/23/24 0537 07/23/24 2352 07/24/24 0401 07/25/24 0500 07/26/24 0641  NA 133* 134*  --  131* 136 136  K 3.7 3.5  --  4.0 3.5 3.6  CL 102 105  --  100 105 105  CO2 21* 20*  --  20* 22 23  GLUCOSE 144* 183* 458* 373* 148* 97  BUN 22* 17  --  28* 25* 18  CREATININE 1.73* 1.49*  --  1.73* 1.61* 1.27*  CALCIUM  8.0* 7.9*  --  7.6* 7.6* 7.7*  MG 2.1 2.0  --  2.0 1.9 1.6*  PHOS 3.5 3.1  --  3.5  --   --    Liver Function Tests: Recent Labs  Lab 07/21/24 0640 07/22/24 1011 07/23/24 0537 07/24/24 0401 07/26/24 0641  AST 32  --   --  17 21  ALT 34  --   --  25 20  ALKPHOS 139*  --   --  150* 115  BILITOT 0.8  --   --  0.4 0.3  PROT 6.9  --   --  6.5 6.8  ALBUMIN <1.5* <1.5* <1.5* <1.5* 1.5*   CBC: Recent Labs  Lab 07/22/24 1011 07/23/24 0537 07/24/24 0401 07/25/24 0500 07/26/24 0641  WBC 18.8* 17.0* 21.3* 19.7* 22.0*  HGB 10.0* 9.4* 7.7* 7.3* 8.6*  HCT 30.7* 29.4* 23.6* 22.6* 26.2*  MCV 88.0 88.8 87.1 89.7 87.9  PLT 594* 624* 674* 712* 787*   CBG: Recent Labs  Lab 07/26/24 0604 07/26/24 1113 07/26/24 1636 07/26/24 2050 07/27/24 0632  GLUCAP 92 108* 192* 192* 148*   Hgb A1c No results for input(s): HGBA1C in the last 72 hours. Lipid Profile No results for input(s): CHOL, HDL, LDLCALC, TRIG, CHOLHDL, LDLDIRECT in the last 72 hours. Thyroid  function studies No results for input(s): TSH, T4TOTAL, T3FREE, THYROIDAB in the last 72 hours.  Invalid input(s): FREET3 Urinalysis    Component Value Date/Time   COLORURINE YELLOW 07/21/2024 1237   APPEARANCEUR CLOUDY (A) 07/21/2024 1237   APPEARANCEUR Cloudy (A) 04/19/2016 1013   LABSPEC 1.016 07/21/2024 1237   PHURINE 5.0 07/21/2024 1237   GLUCOSEU >=500 (A) 07/21/2024 1237   HGBUR NEGATIVE 07/21/2024 1237   BILIRUBINUR NEGATIVE 07/21/2024 1237   BILIRUBINUR Negative 04/19/2016 1013   KETONESUR NEGATIVE 07/21/2024 1237    PROTEINUR >=300 (A) 07/21/2024 1237   NITRITE NEGATIVE 07/21/2024 1237   LEUKOCYTESUR NEGATIVE 07/21/2024 1237    FURTHER DISCHARGE INSTRUCTIONS:   Get Medicines reviewed and adjusted: Please take all your medications with you for your next visit with your Primary MD   Laboratory/radiological data: Please request your Primary MD to go over all hospital tests and procedure/radiological results at the follow up, please ask your Primary MD to get all Hospital records sent to his/her office.   In some cases, they will be blood work, cultures and biopsy results pending at the time of your discharge. Please request that your primary care M.D. goes through all the records of your hospital data and follows up on these results.   Also Note the following: If you experience worsening of your admission symptoms, develop shortness of breath, life threatening emergency, suicidal or homicidal thoughts you must seek medical attention immediately by calling 911 or calling your MD immediately  if symptoms less severe.   You must read complete instructions/literature along with all the possible adverse reactions/side effects for all the Medicines you take and that have been prescribed to you. Take any new Medicines after you have completely  understood and accpet all the possible adverse reactions/side effects.    Do not drive when taking Pain medications or sleeping medications (Benzodaizepines)   Do not take more than prescribed Pain, Sleep and Anxiety Medications. It is not advisable to combine anxiety,sleep and pain medications without talking with your primary care practitioner   Special Instructions: If you have smoked or chewed Tobacco  in the last 2 yrs please stop smoking, stop any regular Alcohol  and or any Recreational drug use.   Wear Seat belts while driving.   Please note: You were cared for by a hospitalist during your hospital stay. Once you are discharged, your primary care physician will  handle any further medical issues. Please note that NO REFILLS for any discharge medications will be authorized once you are discharged, as it is imperative that you return to your primary care physician (or establish a relationship with a primary care physician if you do not have one) for your post hospital discharge needs so that they can reassess your need for medications and monitor your lab values.  Time coordinating discharge: 35 minutes  SIGNED:  Nilda Fendt, MD, PhD 07/27/2024, 8:57 AM

## 2024-07-27 NOTE — Plan of Care (Signed)
  Problem: Education: Goal: Ability to describe self-care measures that may prevent or decrease complications (Diabetes Survival Skills Education) will improve Outcome: Progressing   Problem: Coping: Goal: Ability to adjust to condition or change in health will improve Outcome: Progressing   Problem: Health Behavior/Discharge Planning: Goal: Ability to manage health-related needs will improve Outcome: Progressing   Problem: Skin Integrity: Goal: Risk for impaired skin integrity will decrease Outcome: Progressing   Problem: Clinical Measurements: Goal: Diagnostic test results will improve Outcome: Progressing   Problem: Coping: Goal: Level of anxiety will decrease Outcome: Progressing   Problem: Pain Managment: Goal: General experience of comfort will improve and/or be controlled Outcome: Progressing   Problem: Safety: Goal: Ability to remain free from injury will improve Outcome: Progressing

## 2024-07-28 LAB — AEROBIC/ANAEROBIC CULTURE W GRAM STAIN (SURGICAL/DEEP WOUND)

## 2024-08-01 ENCOUNTER — Ambulatory Visit: Admitting: Physician Assistant

## 2024-08-01 DIAGNOSIS — T8743 Infection of amputation stump, right lower extremity: Secondary | ICD-10-CM

## 2024-08-01 NOTE — Progress Notes (Signed)
 Office Visit Note   Patient: [name]           Date of Birth: December 30, 1985           MRN: [id number] Visit Date: 08/01/2024              Requested by: [name] [name], NP [address] [place],  [place] [address] PCP: [name] [name], NP  Chief Complaint  Patient presents with   Right Leg - Routine Post Op    07/23/2024 right revision BKA      HPI: 38 y/o female with chronic non healing wound Dehiscence Right Below Knee Amputation.  The original BKA was 3 years ago.  She is not s/p revision right BKA.    She is here today for follow up.     Cultures grew Group B streptococcus.  ID recommended PICC line and Cefazolin  2 g every 8 hours for 4 weeks.    Assessment & Plan: Visit Diagnoses:  1. Infection of right below knee amputation (HCC)     Plan: Wet to dry Vashe dressing changes daily.  May wash with soap and water.    Follow-Up Instructions: Return in about 1 week (around 08/08/2024).   Ortho Exam  Patient is alert, oriented, no adenopathy, well-dressed, normal affect, normal respiratory effort. Vac was removed incision is well approximated.  There is an area granulation tissue measuring 2 cm x 3 cm.  No cellulitis or active drainage.  Minimal edema in the stump.  The stump appears viable.  Warm to touch.      Imaging: No results found.     Labs: Lab Results  Component Value Date   HGBA1C 15.2 (H) 07/20/2024   HGBA1C 11.9 (H) 03/16/2023   HGBA1C 10.3 (H) 03/13/2023   ESRSEDRATE >140 (H) 07/21/2024   CRP 23.1 (H) 07/21/2024   CRP 31.2 (H) 07/20/2024   REPTSTATUS 07/28/2024 FINAL 07/23/2024   GRAMSTAIN  07/23/2024    FEW WBC PRESENT, PREDOMINANTLY PMN RARE GRAM POSITIVE COCCI    CULT  07/23/2024    ABUNDANT GROUP B STREP(S.AGALACTIAE)ISOLATED TESTING AGAINST S. AGALACTIAE NOT ROUTINELY PERFORMED DUE TO PREDICTABILITY OF AMP/PEN/VAN SUSCEPTIBILITY. NO ANAEROBES ISOLATED Performed at [facility], [address],  [place], [place] [address]      Lab Results  Component Value Date   ALBUMIN 1.5 (L) 07/26/2024   ALBUMIN <1.5 (L) 07/24/2024   ALBUMIN <1.5 (L) 07/23/2024    Lab Results  Component Value Date   MG 1.6 (L) 07/26/2024   MG 1.9 07/25/2024   MG 2.0 07/24/2024   No results found for: VD25OH  No results found for: PREALBUMIN    Latest Ref Rng & Units 07/26/2024    6:41 AM 07/25/2024    5:00 AM 07/24/2024    4:01 AM  CBC EXTENDED  WBC 4.0 - 10.5 K/uL 22.0  19.7  21.3   RBC 3.87 - 5.11 MIL/uL 2.98  2.52  2.71   Hemoglobin 12.0 - 15.0 g/dL 8.6  7.3  7.7   HCT 63.9 - 46.0 % 26.2  22.6  23.6   Platelets 150 - 400 K/uL 787  712  674      There is no height or weight on file to calculate BMI.  Orders:  No orders of the defined types were placed in this encounter.  No orders of the defined types were placed in this encounter.    Procedures: No procedures performed  Clinical Data: No additional  findings.  ROS:  All other systems negative, except as noted in the HPI. Review of Systems  Objective: Vital Signs: LMP 06/02/2024   Specialty Comments:  No specialty comments available.  PMFS History: Patient Active Problem List   Diagnosis Date Noted   Osteomyelitis of left tibia (HCC) 07/24/2024   Group B streptococcal infection 07/24/2024   BKA stump complication (HCC) 07/20/2024   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 (HCC) 07/20/2024   Community acquired pneumonia of right upper lobe of lung 03/17/2023   Proteinuria due to type 2 diabetes mellitus (HCC) 03/15/2023   Nausea & vomiting 03/15/2023   Lobar pneumonia 03/15/2023   Long-term (current) use of injectable non-insulin  antidiabetic drugs 03/13/2023   Diabetes mellitus (HCC) 03/13/2023   High triglycerides 08/08/2022   Essential hypertension 01/21/2022   Insulin  dependent type 2 diabetes mellitus (HCC) 01/21/2022   Chronic pain of right knee 01/13/2022   Nausea 01/13/2022   Type 2  diabetes mellitus with hyperglycemia (HCC) 11/30/2021   Hypokalemia 10/02/2021   Hypoalbuminemia 09/26/2021   Positive for macroalbuminuria 09/26/2021   Acute blood loss anemia 09/22/2021   ATN (acute tubular necrosis) 09/22/2021   AKI (acute kidney injury) 09/20/2021   Hyponatremia 09/19/2021   Diabetes mellitus type 2, noninsulin dependent (HCC) 09/19/2021   Chronic hypertension 12/16/2020   Missed abortion 12/16/2020   Status post cesarean delivery 12/15/2020   Establishing care with new doctor, encounter for 12/22/2018   Pap smear abnormality of cervix/human papillomavirus (HPV) positive 06/25/2018   BMI 33.0-33.9,adult 03/30/2016   Past Medical History:  Diagnosis Date   Chronic hypertension    12-16-2020  documented in epic since 2017 pt dx hypertension (not during pregnancy);  pt currently is not and has not been taking any bp med,  bp at [facility] 12-15-2020 165/ 104   Missed ab 12/15/2020   Type 2 diabetes mellitus (HCC)    12-16-2020  pt stated only during pregnancy took medication, at [facility] 12-15-2020 blood glucose 353 pt was given metformin  bid and pt stated did start taking it   Wears glasses     Family History  Problem Relation Age of Onset   Diabetes Mother    Hypertension Mother    Hypertension Maternal Grandmother    Diabetes Maternal Aunt    Diabetes Maternal Uncle     Past Surgical History:  Procedure Laterality Date   BELOW KNEE LEG AMPUTATION Right    CESAREAN SECTION N/A 12/25/2018   Procedure: CESAREAN SECTION;  Surgeon: Barbra Lang PARAS, DO;  Location: [facility];  Service: Obstetrics;  Laterality: N/A;   REVISION AMPUTATION, BELOW THE KNEE Right 07/23/2024   Procedure: REVISION AMPUTATION, BELOW THE KNEE RIGHT;  Surgeon: Harden Jerona GAILS, MD;  Location: [facility] OR;  Service: Orthopedics;  Laterality: Right;   TONSILLECTOMY AND ADENOIDECTOMY  age 54   Social History   Occupational History   Not on file  Tobacco Use   Smoking status: Never   Smokeless tobacco:  Never  Vaping Use   Vaping status: Never Used  Substance and Sexual Activity   Alcohol use: No   Drug use: Never   Sexual activity: Yes    Birth control/protection: None

## 2024-08-03 ENCOUNTER — Encounter: Payer: Self-pay | Admitting: Physician Assistant

## 2024-08-04 ENCOUNTER — Telehealth: Payer: Self-pay | Admitting: Physician Assistant

## 2024-08-04 ENCOUNTER — Encounter: Payer: Self-pay | Admitting: Radiology

## 2024-08-04 ENCOUNTER — Other Ambulatory Visit: Payer: Self-pay | Admitting: Orthopedic Surgery

## 2024-08-04 MED ORDER — OXYCODONE HCL 5 MG PO TABS: 5.0000 mg | ORAL_TABLET | Freq: Four times a day (QID) | ORAL | 0 refills | Status: AC | PRN

## 2024-08-04 NOTE — Telephone Encounter (Signed)
 Pt called requesting a refill of oxycodone . Please send to [facility] [address]. Pt phone number is [phone].

## 2024-08-08 ENCOUNTER — Ambulatory Visit: Admitting: Physician Assistant

## 2024-08-08 ENCOUNTER — Encounter: Payer: Self-pay | Admitting: Physician Assistant

## 2024-08-08 DIAGNOSIS — T8781 Dehiscence of amputation stump: Secondary | ICD-10-CM

## 2024-08-08 NOTE — Progress Notes (Addendum)
 Office Visit Note   Patient: [name]           Date of Birth: 1985-12-06           MRN: [id number] Visit Date: 08/08/2024              Requested by: [name] [name], NP [address] [place],  [place] [address] PCP: [name] [name], NP  Chief Complaint  Patient presents with   Right Leg - Routine Post Op      HPI: 38 y/o female with chronic non healing wound Dehiscence Right Below Knee Amputation.  The original BKA was 3 years ago.  She is s/p revision right BKA.    She is here today for follow up.   The posterior flap has tissue loss from previous wound healing.                Cultures grew Group B streptococcus.  ID recommended PICC line and Cefazolin  2 g every 8 hours for 4 weeks.  Assessment & Plan: Visit Diagnoses:  1. Dehiscence of amputation stump (HCC)     Plan: dry guaze to the lateral incision.  Ace wrap compression and black stump sock.  May shower as needed.  Elevation PRN for edema.    Follow-Up Instructions: Return in about 1 week (around 08/15/2024).   Ortho Exam  Patient is alert, oriented, no adenopathy, well-dressed, normal affect, normal respiratory effort. Sutures intact stump compression improved with significant decreased edema.  Dry skin was debrided from stump.  The wound has gotten smaller and new tissue has replaced it.  Lateral stump with minimal SS drainage.  No cellulitis.        Imaging: No results found. No images are attached to the encounter.  Labs: Lab Results  Component Value Date   HGBA1C 15.2 (H) 07/20/2024   HGBA1C 11.9 (H) 03/16/2023   HGBA1C 10.3 (H) 03/13/2023   ESRSEDRATE >140 (H) 07/21/2024   CRP 23.1 (H) 07/21/2024   CRP 31.2 (H) 07/20/2024   REPTSTATUS 07/28/2024 FINAL 07/23/2024   GRAMSTAIN  07/23/2024    FEW WBC PRESENT, PREDOMINANTLY PMN RARE GRAM POSITIVE COCCI    CULT  07/23/2024    ABUNDANT GROUP B STREP(S.AGALACTIAE)ISOLATED TESTING AGAINST S. AGALACTIAE NOT ROUTINELY PERFORMED  DUE TO PREDICTABILITY OF AMP/PEN/VAN SUSCEPTIBILITY. NO ANAEROBES ISOLATED Performed at [facility], [address], [place], [place] [address]      Lab Results  Component Value Date   ALBUMIN 1.5 (L) 07/26/2024   ALBUMIN <1.5 (L) 07/24/2024   ALBUMIN <1.5 (L) 07/23/2024    Lab Results  Component Value Date   MG 1.6 (L) 07/26/2024   MG 1.9 07/25/2024   MG 2.0 07/24/2024   No results found for: VD25OH  No results found for: PREALBUMIN    Latest Ref Rng & Units 07/26/2024    6:41 AM 07/25/2024    5:00 AM 07/24/2024    4:01 AM  CBC EXTENDED  WBC 4.0 - 10.5 K/uL 22.0  19.7  21.3   RBC 3.87 - 5.11 MIL/uL 2.98  2.52  2.71   Hemoglobin 12.0 - 15.0 g/dL 8.6  7.3  7.7   HCT 63.9 - 46.0 % 26.2  22.6  23.6   Platelets 150 - 400 K/uL 787  712  674      There is no height or weight on file to calculate BMI.  Orders:  No orders of the defined types were placed in this encounter.  No orders of the defined types were placed in this encounter.    Procedures: No procedures performed  Clinical Data: No additional findings.  ROS:  All other systems negative, except as noted in the HPI. Review of Systems  Objective: Vital Signs: LMP 06/02/2024   Specialty Comments:  No specialty comments available.  PMFS History: Patient Active Problem List   Diagnosis Date Noted   Osteomyelitis of left tibia (HCC) 07/24/2024   Group B streptococcal infection 07/24/2024   BKA stump complication (HCC) 07/20/2024   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 (HCC) 07/20/2024   Community acquired pneumonia of right upper lobe of lung 03/17/2023   Proteinuria due to type 2 diabetes mellitus (HCC) 03/15/2023   Nausea & vomiting 03/15/2023   Lobar pneumonia 03/15/2023   Long-term (current) use of injectable non-insulin  antidiabetic drugs 03/13/2023   Diabetes mellitus (HCC) 03/13/2023   High triglycerides 08/08/2022   Essential hypertension 01/21/2022    Insulin  dependent type 2 diabetes mellitus (HCC) 01/21/2022   Chronic pain of right knee 01/13/2022   Nausea 01/13/2022   Type 2 diabetes mellitus with hyperglycemia (HCC) 11/30/2021   Hypokalemia 10/02/2021   Hypoalbuminemia 09/26/2021   Positive for macroalbuminuria 09/26/2021   Acute blood loss anemia 09/22/2021   ATN (acute tubular necrosis) 09/22/2021   AKI (acute kidney injury) 09/20/2021   Hyponatremia 09/19/2021   Diabetes mellitus type 2, noninsulin dependent (HCC) 09/19/2021   Chronic hypertension 12/16/2020   Missed abortion 12/16/2020   Status post cesarean delivery 12/15/2020   Establishing care with new doctor, encounter for 12/22/2018   Pap smear abnormality of cervix/human papillomavirus (HPV) positive 06/25/2018   BMI 33.0-33.9,adult 03/30/2016   Past Medical History:  Diagnosis Date   Chronic hypertension    12-16-2020  documented in epic since 2017 pt dx hypertension (not during pregnancy);  pt currently is not and has not been taking any bp med,  bp at [facility] 12-15-2020 165/ 104   Missed ab 12/15/2020   Type 2 diabetes mellitus (HCC)    12-16-2020  pt stated only during pregnancy took medication, at [facility] 12-15-2020 blood glucose 353 pt was given metformin  bid and pt stated did start taking it   Wears glasses     Family History  Problem Relation Age of Onset   Diabetes Mother    Hypertension Mother    Hypertension Maternal Grandmother    Diabetes Maternal Aunt    Diabetes Maternal Uncle     Past Surgical History:  Procedure Laterality Date   BELOW KNEE LEG AMPUTATION Right    CESAREAN SECTION N/A 12/25/2018   Procedure: CESAREAN SECTION;  Surgeon: Barbra Lang PARAS, DO;  Location: [facility];  Service: Obstetrics;  Laterality: N/A;   REVISION AMPUTATION, BELOW THE KNEE Right 07/23/2024   Procedure: REVISION AMPUTATION, BELOW THE KNEE RIGHT;  Surgeon: Harden Jerona GAILS, MD;  Location: [facility] OR;  Service: Orthopedics;  Laterality: Right;   TONSILLECTOMY AND  ADENOIDECTOMY  age 39   Social History   Occupational History   Not on file  Tobacco Use   Smoking status: Never   Smokeless tobacco: Never  Vaping Use   Vaping status: Never Used  Substance and Sexual Activity   Alcohol use: No   Drug use: Never   Sexual activity: Yes    Birth control/protection: None

## 2024-08-12 ENCOUNTER — Ambulatory Visit: Admitting: Internal Medicine

## 2024-08-15 ENCOUNTER — Ambulatory Visit (INDEPENDENT_AMBULATORY_CARE_PROVIDER_SITE_OTHER): Admitting: Physician Assistant

## 2024-08-15 DIAGNOSIS — T8781 Dehiscence of amputation stump: Secondary | ICD-10-CM

## 2024-08-15 NOTE — Progress Notes (Signed)
 Office Visit Note   Patient: [name]           Date of Birth: [birth year]           MRN: [id number] Visit Date: 08/15/2024              Requested by: [name] [name], NP [address] [place],  [place] [address] PCP: [name] [name], NP  Chief Complaint  Patient presents with   Right Leg - Routine Post Op    07/23/2024 Right BKA revision      HPI: 38 y/o female with chronic non healing wound Dehiscence Right Below Knee Amputation.  The original BKA was 3 years ago.  She is  s/p revision right BKA.    She is here today for follow up.   The posterior flap has tissue loss from previous wound healing.                Cultures grew Group B streptococcus.  ID recommended PICC line and Cefazolin  2 g every 8 hours for 4 weeks.  Assessment & Plan: Visit Diagnoses:  1. Dehiscence of amputation stump (HCC)     Plan: She will stop using the Vashe and dial soap.  She will start using dove soap and wear the black stump sock.  The sutures were removed today and she tolerated this well.  I gave her an order for [employer] to start the prosthetic eval.    Follow-Up Instructions: Return in about 18 days (around 09/02/2024).   Ortho Exam  Patient is alert, oriented, no adenopathy, well-dressed, normal affect, normal respiratory effort. The incision has healed.  The stump has shrunk, the skin is warm to touch and not painful.  The skin is very dry.  The posterior skin flap wound has healed.     Imaging: No results found.      Labs: Lab Results  Component Value Date   HGBA1C 15.2 (H) 07/20/2024   HGBA1C 11.9 (H) 03/16/2023   HGBA1C 10.3 (H) 03/13/2023   ESRSEDRATE >140 (H) 07/21/2024   CRP 23.1 (H) 07/21/2024   CRP 31.2 (H) 07/20/2024   REPTSTATUS 07/28/2024 FINAL 07/23/2024   GRAMSTAIN  07/23/2024    FEW WBC PRESENT, PREDOMINANTLY PMN RARE GRAM POSITIVE COCCI    CULT  07/23/2024    ABUNDANT GROUP B STREP(S.AGALACTIAE)ISOLATED TESTING AGAINST S. AGALACTIAE  NOT ROUTINELY PERFORMED DUE TO PREDICTABILITY OF AMP/PEN/VAN SUSCEPTIBILITY. NO ANAEROBES ISOLATED Performed at [facility], [address] [place], [place] [address]      Lab Results  Component Value Date   ALBUMIN 1.5 (L) 07/26/2024   ALBUMIN <1.5 (L) 07/24/2024   ALBUMIN <1.5 (L) 07/23/2024    Lab Results  Component Value Date   MG 1.6 (L) 07/26/2024   MG 1.9 07/25/2024   MG 2.0 07/24/2024   No results found for: VD25OH  No results found for: PREALBUMIN    Latest Ref Rng & Units 07/26/2024    6:41 AM 07/25/2024    5:00 AM 07/24/2024    4:01 AM  CBC EXTENDED  WBC 4.0 - 10.5 K/uL 22.0  19.7  21.3   RBC 3.87 - 5.11 MIL/uL 2.98  2.52  2.71   Hemoglobin 12.0 - 15.0 g/dL 8.6  7.3  7.7   HCT 63.9 - 46.0 % 26.2  22.6  23.6   Platelets 150 - 400 K/uL 787  712  674      There is no height or weight on  file to calculate BMI.  Orders:  No orders of the defined types were placed in this encounter.  No orders of the defined types were placed in this encounter.    Procedures: No procedures performed  Clinical Data: No additional findings.  ROS:  All other systems negative, except as noted in the HPI. Review of Systems  Objective: Vital Signs: LMP 06/02/2024   Specialty Comments:  No specialty comments available.  PMFS History: Patient Active Problem List   Diagnosis Date Noted   Osteomyelitis of left tibia (HCC) 07/24/2024   Group B streptococcal infection 07/24/2024   BKA stump complication (HCC) 07/20/2024   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 (HCC) 07/20/2024   Community acquired pneumonia of right upper lobe of lung 03/17/2023   Proteinuria due to type 2 diabetes mellitus (HCC) 03/15/2023   Nausea & vomiting 03/15/2023   Lobar pneumonia 03/15/2023   Long-term (current) use of injectable non-insulin  antidiabetic drugs 03/13/2023   Diabetes mellitus (HCC) 03/13/2023   High triglycerides 08/08/2022   Essential  hypertension 01/21/2022   Insulin  dependent type 2 diabetes mellitus (HCC) 01/21/2022   Chronic pain of right knee 01/13/2022   Nausea 01/13/2022   Type 2 diabetes mellitus with hyperglycemia (HCC) 11/30/2021   Hypokalemia 10/02/2021   Hypoalbuminemia 09/26/2021   Positive for macroalbuminuria 09/26/2021   Acute blood loss anemia 09/22/2021   ATN (acute tubular necrosis) 09/22/2021   AKI (acute kidney injury) 09/20/2021   Hyponatremia 09/19/2021   Diabetes mellitus type 2, noninsulin dependent (HCC) 09/19/2021   Chronic hypertension 12/16/2020   Missed abortion 12/16/2020   Status post cesarean delivery 12/15/2020   Establishing care with new doctor, encounter for 12/22/2018   Pap smear abnormality of cervix/human papillomavirus (HPV) positive 06/25/2018   BMI 33.0-33.9,adult 03/30/2016   Past Medical History:  Diagnosis Date   Chronic hypertension    12-16-2020  documented in epic since 2017 pt dx hypertension (not during pregnancy);  pt currently is not and has not been taking any bp med,  bp at [facility] 12-15-2020 165/ 104   Missed ab 12/15/2020   Type 2 diabetes mellitus (HCC)    12-16-2020  pt stated only during pregnancy took medication, at [facility] 12-15-2020 blood glucose 353 pt was given metformin  bid and pt stated did start taking it   Wears glasses     Family History  Problem Relation Age of Onset   Diabetes Mother    Hypertension Mother    Hypertension Maternal Grandmother    Diabetes Maternal Aunt    Diabetes Maternal Uncle     Past Surgical History:  Procedure Laterality Date   BELOW KNEE LEG AMPUTATION Right    CESAREAN SECTION N/A 12/25/2018   Procedure: CESAREAN SECTION;  Surgeon: Barbra Lang PARAS, DO;  Location: [facility];  Service: Obstetrics;  Laterality: N/A;   REVISION AMPUTATION, BELOW THE KNEE Right 07/23/2024   Procedure: REVISION AMPUTATION, BELOW THE KNEE RIGHT;  Surgeon: Harden Jerona GAILS, MD;  Location: [facility] OR;  Service: Orthopedics;  Laterality: Right;    TONSILLECTOMY AND ADENOIDECTOMY  age 34   Social History   Occupational History   Not on file  Tobacco Use   Smoking status: Never   Smokeless tobacco: Never  Vaping Use   Vaping status: Never Used  Substance and Sexual Activity   Alcohol use: No   Drug use: Never   Sexual activity: Yes    Birth control/protection: None

## 2024-08-18 ENCOUNTER — Encounter: Payer: Self-pay | Admitting: Physician Assistant

## 2024-08-20 ENCOUNTER — Telehealth: Payer: Self-pay

## 2024-08-20 NOTE — Telephone Encounter (Signed)
 Received call from patient stating her [name] RN wants to know if PICC can be pulled prior to patient's appointment on 11/21. Per OPAT orders from Allison July, NP, okay to pull PICC after last dose on 11/19.  Patient missed 11/11 follow up appointment.   Per Allison July, NP okay to continue with pulling PICC today. Orders sent to Allison Herring, RN with [employer].  [name], BSN, RN

## 2024-08-22 ENCOUNTER — Other Ambulatory Visit: Payer: Self-pay

## 2024-08-22 ENCOUNTER — Ambulatory Visit: Admitting: Infectious Diseases

## 2024-08-22 VITALS — BP 131/87 | HR 110 | Temp 98.8°F

## 2024-08-22 DIAGNOSIS — Z452 Encounter for adjustment and management of vascular access device: Secondary | ICD-10-CM | POA: Diagnosis not present

## 2024-08-22 DIAGNOSIS — Z5181 Encounter for therapeutic drug level monitoring: Secondary | ICD-10-CM | POA: Insufficient documentation

## 2024-08-22 DIAGNOSIS — A491 Streptococcal infection, unspecified site: Secondary | ICD-10-CM | POA: Diagnosis not present

## 2024-08-22 NOTE — Progress Notes (Signed)
 Reason for fu: rt BKA dehiscence   Patient Active Problem List   Diagnosis Date Noted   Osteomyelitis of left tibia (HCC) 07/24/2024   Group B streptococcal infection 07/24/2024   BKA stump complication (HCC) 07/20/2024   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 (HCC) 07/20/2024   Community acquired pneumonia of right upper lobe of lung 03/17/2023   Proteinuria due to type 2 diabetes mellitus (HCC) 03/15/2023   Nausea & vomiting 03/15/2023   Lobar pneumonia 03/15/2023   Long-term (current) use of injectable non-insulin  antidiabetic drugs 03/13/2023   Diabetes mellitus (HCC) 03/13/2023   High triglycerides 08/08/2022   Essential hypertension 01/21/2022   Insulin  dependent type 2 diabetes mellitus (HCC) 01/21/2022   Chronic pain of right knee 01/13/2022   Nausea 01/13/2022   Type 2 diabetes mellitus with hyperglycemia (HCC) 11/30/2021   Hypokalemia 10/02/2021   Hypoalbuminemia 09/26/2021   Positive for macroalbuminuria 09/26/2021   Acute blood loss anemia 09/22/2021   ATN (acute tubular necrosis) 09/22/2021   AKI (acute kidney injury) 09/20/2021   Hyponatremia 09/19/2021   Diabetes mellitus type 2, noninsulin dependent (HCC) 09/19/2021   Chronic hypertension 12/16/2020   Missed abortion 12/16/2020   Status post cesarean delivery 12/15/2020   Establishing care with new doctor, encounter for 12/22/2018   Pap smear abnormality of cervix/human papillomavirus (HPV) positive 06/25/2018   BMI 33.0-33.9,adult 03/30/2016    Patient's Medications  New Prescriptions   No medications on file  Previous Medications   ACETAMINOPHEN  (TYLENOL ) 500 MG TABLET    Take 1,000 mg by mouth every 6 (six) hours as needed for mild pain (pain score 1-3), headache or fever.   CEFAZOLIN  (ANCEF ) IVPB    Inject 2 g into the vein every 8 (eight) hours for 27 days. Indication:  osteomyelitis  First Dose: Yes Last Day of Therapy:  08/20/2024 Labs - Once weekly:  CBC/D and BMP, Labs - Once  weekly: ESR and CRP Method of administration: IV Push Method of administration may be changed at the discretion of home infusion pharmacist based upon assessment of the patient and/or caregiver's ability to self-administer the medication ordered.   INSULIN  GLARGINE (LANTUS  SOLOSTAR) 100 UNIT/ML SOLOSTAR PEN    Inject 15 Units into the skin at bedtime. Patient is taking only 10 unit   NIFEDIPINE  (ADALAT  CC) 30 MG 24 HR TABLET    Take 1 tablet (30 mg total) by mouth daily.   NOVOLOG  FLEXPEN 100 UNIT/ML FLEXPEN    Inject 5 Units into the skin 3 (three) times daily with meals. Also can use sliding scale if needed. Patient is taking 3 U at dinner and 5 unit Breakfast and lunch   ONDANSETRON  (ZOFRAN -ODT) 4 MG DISINTEGRATING TABLET    Take 4 mg by mouth every 6 (six) hours as needed.   OXYCODONE  (OXY IR/ROXICODONE ) 5 MG IMMEDIATE RELEASE TABLET    Take 1 tablet (5 mg total) by mouth every 6 (six) hours as needed for moderate pain (pain score 4-6).  Modified Medications   No medications on file  Discontinued Medications   No medications on file    Subjective: 38 Y O Female with prior h/o IDDM2, HTN, rt BKA who is here for HFU after recent admission 10/19-10/26 for rt BKA wound infection, osteomyelitis.  MRI findings concerning for infection. Underwent I&D and revision by Dr. Harden on 10/22.  OR culture grew strep again.  Seen by orthopedics and ID and discharged on 10/26 to complete 4 weeks of IV cefazolin  through 11/19  via PICC.   11/21 She has been closely following with orthopedics and was last seen on 11/14, Right BKA wound has healed.  He has completed IV cefazolin  on 11/19 and PICC has been removed.  No concerns in her right BKA site. She is getting flu and pneumonia vaccine with PCP. No other concerns.   Review of Systems: All systems reviewed with pertinent positive and negative as listed above  Past Medical History:  Diagnosis Date   Chronic hypertension    12-16-2020  documented in epic  since 2017 pt dx hypertension (not during pregnancy);  pt currently is not and has not been taking any bp med,  bp at [facility] 12-15-2020 165/ 104   Missed ab 12/15/2020   Type 2 diabetes mellitus (HCC)    12-16-2020  pt stated only during pregnancy took medication, at [facility] 12-15-2020 blood glucose 353 pt was given metformin  bid and pt stated did start taking it   Wears glasses    Past Surgical History:  Procedure Laterality Date   BELOW KNEE LEG AMPUTATION Right    CESAREAN SECTION N/A 12/25/2018   Procedure: CESAREAN SECTION;  Surgeon: Barbra Lang PARAS, DO;  Location: [facility] LD ORS;  Service: Obstetrics;  Laterality: N/A;   REVISION AMPUTATION, BELOW THE KNEE Right 07/23/2024   Procedure: REVISION AMPUTATION, BELOW THE KNEE RIGHT;  Surgeon: Harden Jerona GAILS, MD;  Location: [facility] OR;  Service: Orthopedics;  Laterality: Right;   TONSILLECTOMY AND ADENOIDECTOMY  age 36    Social History   Tobacco Use   Smoking status: Never   Smokeless tobacco: Never  Vaping Use   Vaping status: Never Used  Substance Use Topics   Alcohol use: No   Drug use: Never    Family History  Problem Relation Age of Onset   Diabetes Mother    Hypertension Mother    Hypertension Maternal Grandmother    Diabetes Maternal Aunt    Diabetes Maternal Uncle     Allergies  Allergen Reactions   Almond (Diagnostic) Hives   Almond Oil Hives   Bee Venom Hives   Limonene Hives and Nausea And Vomiting   Chlorhexidine  Rash    Unable to use CHG wipes    Health Maintenance  Topic Date Due   FOOT EXAM  Never done   OPHTHALMOLOGY EXAM  Never done   Hepatitis C Screening  Never done   Pneumococcal Vaccine (1 of 2 - PCV) Never done   HPV VACCINES (1 - 3-dose SCDM series) Never done   COVID-19 Vaccine (3 - Moderna risk series) 02/11/2020   Cervical Cancer Screening (HPV/Pap Cotest)  06/21/2023   Diabetic kidney evaluation - Urine ACR  03/12/2024   Influenza Vaccine  05/02/2024   HEMOGLOBIN A1C  01/18/2025   Diabetic kidney  evaluation - eGFR measurement  07/26/2025   DTaP/Tdap/Td (7 - Td or Tdap) 10/31/2028   Hepatitis B Vaccines 19-59 Average Risk  Completed   HIV Screening  Completed   Meningococcal B Vaccine  Aged Out    Objective: BP 131/87   Pulse (!) 110   Temp 98.8 F (37.1 C) (Oral)   LMP 06/02/2024   SpO2 97%    Physical Exam Constitutional:      Appearance: Normal appearance.  HENT:     Head: Normocephalic and atraumatic.      Mouth: Mucous membranes are moist.  Eyes:    Conjunctiva/sclera: Conjunctivae normal.     Pupils: Pupils are equal, round, and b/l symmetrical    Cardiovascular:  Rate and Rhythm: Normal rate      Heart sounds: s1s2  Pulmonary:     Effort: Pulmonary effort is normal.     Breath sounds: Normal breath sounds.   Abdominal:     General: Non distended     Palpations: soft.   Musculoskeletal:        General: Sitting in a wheelchair, right BKA site has well-healed with no wound or no signs of infection RT BKA     Skin:    General: Skin is warm and dry.     Comments:  Neurological:     General: grossly non focal     Mental Status: awake, alert and oriented to person, place, and time.   Psychiatric:        Mood and Affect: Mood normal.   Lab Results Lab Results  Component Value Date   WBC 22.0 (H) 07/26/2024   HGB 8.6 (L) 07/26/2024   HCT 26.2 (L) 07/26/2024   MCV 87.9 07/26/2024   PLT 787 (H) 07/26/2024    Lab Results  Component Value Date   CREATININE 1.27 (H) 07/26/2024   BUN 18 07/26/2024   NA 136 07/26/2024   K 3.6 07/26/2024   CL 105 07/26/2024   CO2 23 07/26/2024    Lab Results  Component Value Date   ALT 20 07/26/2024   AST 21 07/26/2024   ALKPHOS 115 07/26/2024   BILITOT 0.3 07/26/2024    Lab Results  Component Value Date   CHOL 193 03/13/2023   HDL 31 (L) 03/13/2023   LDLCALC 118 (H) 03/13/2023   TRIG 251 (H) 03/13/2023   CHOLHDL 6.2 (H) 03/13/2023   Lab Results  Component Value Date   LABRPR Non Reactive  12/22/2018   No results found for: HIV1RNAQUANT, HIV1RNAVL, CD4TABS  Microbiology Results for orders placed or performed during the hospital encounter of 07/19/24  Blood culture (routine x 2)     Status: None   Collection Time: 07/19/24  8:23 PM   Specimen: BLOOD RIGHT HAND  Result Value Ref Range Status   Specimen Description BLOOD RIGHT HAND  Final   Special Requests   Final    BOTTLES DRAWN AEROBIC AND ANAEROBIC Blood Culture results may not be optimal due to an inadequate volume of blood received in culture bottles   Culture   Final    NO GROWTH 5 DAYS Performed at [facility], [address], [place], [place] [address]    Report Status 07/24/2024 FINAL  Final  Blood culture (routine x 2)     Status: None   Collection Time: 07/19/24  8:23 PM   Specimen: BLOOD LEFT HAND  Result Value Ref Range Status   Specimen Description BLOOD LEFT HAND  Final   Special Requests   Final    BOTTLES DRAWN AEROBIC ONLY Blood Culture results may not be optimal due to an inadequate volume of blood received in culture bottles   Culture   Final    NO GROWTH 5 DAYS Performed at [facility], [address], [place], [place] [address]    Report Status 07/24/2024 FINAL  Final  MRSA Next Gen by PCR, Nasal     Status: None   Collection Time: 07/23/24  5:58 AM   Specimen: Nasal Mucosa; Nasal Swab  Result Value Ref Range Status   MRSA by PCR Next Gen NOT DETECTED NOT DETECTED Final    Comment: (NOTE) The GeneXpert MRSA Assay (FDA approved for NASAL specimens only),  is one component of a comprehensive MRSA colonization surveillance program. It is not intended to diagnose MRSA infection nor to guide or monitor treatment for MRSA infections. Test performance is not FDA approved in patients less than 87 years old. Performed at [facility], [address] [place], [place] [address]   Aerobic/Anaerobic Culture w Gram Stain (surgical/deep wound)     Status: None   Collection  Time: 07/23/24  1:22 PM   Specimen: Soft Tissue, Other  Result Value Ref Range Status   Specimen Description TISSUE  Final   Special Requests ABSCESS RIGHT BELOW KNEE AMPUTATION  Final   Gram Stain   Final    FEW WBC PRESENT, PREDOMINANTLY PMN RARE GRAM POSITIVE COCCI    Culture   Final    ABUNDANT GROUP B STREP(S.AGALACTIAE)ISOLATED TESTING AGAINST S. AGALACTIAE NOT ROUTINELY PERFORMED DUE TO PREDICTABILITY OF AMP/PEN/VAN SUSCEPTIBILITY. NO ANAEROBES ISOLATED Performed at [facility], [address], [place], [place] [address]    Report Status 07/28/2024 FINAL  Final   Imaging  VAS US  UPPER EXTREMITY VENOUS DUPLEX Result Date: 07/28/2024 UPPER VENOUS STUDY  Patient Name:  AHTZIRI JEFFRIES  Date of Exam:   07/26/2024 Medical Rec #: [id number]      Accession #:    [id number] Date of Birth: 31-Mar-1986     Patient Gender: F Patient Age:   13 years Exam Location:  [facility] Procedure:      VAS US  UPPER EXTREMITY VENOUS DUPLEX Referring Phys: COSTIN GHERGHE --------------------------------------------------------------------------------  Indications: Pain, Swelling, and Right PICC line Limitations: Bandages, line and poor ultrasound/tissue interface. Comparison Study: No prior study on file Performing Technologist: Alberta Lis RVS  Examination Guidelines: A complete evaluation includes B-mode imaging, spectral Doppler, color Doppler, and power Doppler as needed of all accessible portions of each vessel. Bilateral testing is considered an integral part of a complete examination. Limited examinations for reoccurring indications may be performed as noted.  Right Findings: +----------+------------+---------+-----------+----------+--------------+ RIGHT     CompressiblePhasicitySpontaneousProperties   Summary     +----------+------------+---------+-----------+----------+--------------+ IJV           Full       Yes       Yes                              +----------+------------+---------+-----------+----------+--------------+ Subclavian               Yes       Yes                             +----------+------------+---------+-----------+----------+--------------+ Axillary                 Yes       Yes                             +----------+------------+---------+-----------+----------+--------------+ Brachial                 Yes       Yes                             +----------+------------+---------+-----------+----------+--------------+ Radial        Full                                                 +----------+------------+---------+-----------+----------+--------------+  Ulnar         Full                                                 +----------+------------+---------+-----------+----------+--------------+ Cephalic                                            Not visualized +----------+------------+---------+-----------+----------+--------------+ Basilic                                             Not visualized +----------+------------+---------+-----------+----------+--------------+  Left Findings: +----------+------------+---------+-----------+----------+-------+ LEFT      CompressiblePhasicitySpontaneousPropertiesSummary +----------+------------+---------+-----------+----------+-------+ Subclavian               Yes       Yes                      +----------+------------+---------+-----------+----------+-------+  Summary:  Right: No obvious evidence of deep vein thrombosis in the visualized veins of the upper extremity.  Left: No evidence of thrombosis in the subclavian.  *See table(s) above for measurements and observations.  Diagnosing physician: Gaile New MD Electronically signed by Gaile New MD on 07/28/2024 at 10:24:54 AM.    Final    DG CHEST PORT 1 VIEW Result Date: 07/26/2024 CLINICAL DATA:  Pain. EXAM: PORTABLE CHEST 1 VIEW COMPARISON:  Chest radiograph 03/15/2023  FINDINGS: Tip of the right upper extremity PICC in the upper SVC. The cardiomediastinal contours are normal. Subsegmental linear atelectasis at the right lung base. Lung volumes are low. Pulmonary vasculature is normal. No consolidation, pleural effusion, or pneumothorax. No acute osseous abnormalities are seen. IMPRESSION: Low lung volumes with subsegmental linear atelectasis at the right lung base. Electronically Signed   By: Andrea Gasman M.D.   On: 07/26/2024 17:38   DG Humerus Right Result Date: 07/26/2024 EXAM: 1 VIEW XRAY OF THE RIGHT HUMERUS 07/26/2024 06:52:34 AM COMPARISON: None available. CLINICAL HISTORY: Complication associated with peripherally inserted central catheter (PICC) [id number]; [id number] Swelling of arm [id number]. Per chart - Complication associated with peripherally inserted central catheter (PICC). FINDINGS: BONES AND JOINTS: No acute fracture. No focal osseous lesion. No joint dislocation. SOFT TISSUES: Right upper extremity PICC line in place. The PICC line courses towards the midline of the chest and is excluded from the field of view. Subjective edema about the right upper extremity. IMPRESSION: 1. Right upper extremity PICC line present which courses toward the midline, distal tip excluded from the field of view. 2. No acute osseous findings. Electronically signed by: Waddell Calk MD 07/26/2024 07:40 AM EDT RP Workstation: [id number]   US  EKG SITE RITE Result Date: 07/24/2024 If Site Rite image not attached, placement could not be confirmed due to current cardiac rhythm.  07/20/2024 MRI rt leg  IMPRESSION: 1. Corresponding with the recent radiographic findings, there is a large complex fluid collection containing multiple air bubbles within the soft tissues of the distal stump, highly suspicious for an abscess and underlying gas-forming infection. 2. Low level marrow T2 hyperintensity within the distal tibia is nonspecific, but suspicious for early osteomyelitis. 3.  Separate ill-defined fluid within the prepatellar bursa, incompletely visualized, consistent with bursitis.  This could be infected as well. No significant joint effusion or evidence of osteomyelitis at the visualized knee. 4. Diffuse fatty atrophy and mild T2 hyperintensity throughout the lower leg musculature, attributed to prior amputation.    Assessment/Plan # Rt BKA stump infection/dehiscence  - 22 I&D with revision, application of Kerecis micro graft.  Culture with group B strep - s/p 4 weeks of IV cefazolin  completed through 11/19  - Right BKA is healed - discusses sign and symptoms of recurrence of infection to seek attention  - Follow-up as needed  # DM2 - Discussed follow-up with PCP for optimal blood glucose control  # Immunization counseling - discussed flu vaccine   I spent 31 minutes involved in face-to-face and non-face-to-face activities for this patient on the day of the visit. Professional time spent includes the following activities: Preparing to see the patient (review of tests), Obtaining and reviewing separately obtained history (discharge record 10/26, Dr Fleeta Rothman note, OR note, Ortho note 11/14), Performing a medically appropriate examination and evaluation,  Documenting clinical information in the EMR, Independently interpreting results (not separately reported), Communicating results to the patient, Counseling and educating the patient and Care coordination (not separately reported).   Of note, portions of this note may have been created with voice recognition software. While this note has been edited for accuracy, occasional wrong-word or 'sound-a-like' substitutions may have occurred due to the inherent limitations of voice recognition software.   Annalee Joseph, MD [facility] 08/22/2024, 9:50 AM

## 2024-09-02 ENCOUNTER — Ambulatory Visit: Admitting: Physician Assistant

## 2024-09-02 ENCOUNTER — Encounter: Payer: Self-pay | Admitting: Physician Assistant

## 2024-09-02 DIAGNOSIS — T8781 Dehiscence of amputation stump: Secondary | ICD-10-CM

## 2024-09-02 NOTE — Progress Notes (Signed)
 Office Visit Note   Patient: [name]           Date of Birth: 08/18/1986           MRN: [id number] Visit Date: 09/02/2024              Requested by: [name] [name], NP [address] [place],  [place] [address] PCP: [name] [name], NP  Chief Complaint  Patient presents with   Right Leg - Routine Post Op    07/23/2024 right revision BKA       HPI: 38 y/o female with chronic non healing wound Dehiscence Right Below Knee Amputation. The original BKA was 3 years ago. She is s/p revision right BKA. She is here today for follow up. She has used dove soap and water to keep her skin softer.  She is very pleased with her progress.    Assessment & Plan: Visit Diagnoses:  1. Dehiscence of amputation stump (HCC)     Plan: I gave her prescription for hanger new prosthesis stump socks and supplies.  She can use lotion on the skin at this time.  I ask her to call me an leave a message for the PT place she was previously going to so she can go for gait training.    Follow-Up Instructions: Return if symptoms worsen or fail to improve, for once she is ambulatory.   Ortho Exam  Patient is alert, oriented, no adenopathy, well-dressed, normal affect, normal respiratory effort. Viable warm stump with minimal edema.  Superficial piece of dry skin/scab was removed revealing healthy pink skin.    Patient is a new right transtibial  amputee.  Patient's current comorbidities are not expected to impact the ability to function with the prescribed prosthesis. Patient verbally communicates a strong desire to use a prosthesis. Patient currently requires mobility aids to ambulate without a prosthesis.  Expects not to use mobility aids with a new prosthesis. Patient is expected to resume or reach their K Level within 6 months. Patient was active before the amputation and independent with stairs, uneven terrain, varying cadence, and a community ambulator.  Patient is a K3 level  ambulator that spends a lot of time walking around on uneven terrain over obstacles, up and down stairs, and ambulates with a variable cadence.  She is young and may benefit from a hydraulic ankle.     Imaging: No results found.   Labs: Lab Results  Component Value Date   HGBA1C 15.2 (H) 07/20/2024   HGBA1C 11.9 (H) 03/16/2023   HGBA1C 10.3 (H) 03/13/2023   ESRSEDRATE >140 (H) 07/21/2024   CRP 23.1 (H) 07/21/2024   CRP 31.2 (H) 07/20/2024   REPTSTATUS 07/28/2024 FINAL 07/23/2024   GRAMSTAIN  07/23/2024    FEW WBC PRESENT, PREDOMINANTLY PMN RARE GRAM POSITIVE COCCI    CULT  07/23/2024    ABUNDANT GROUP B STREP(S.AGALACTIAE)ISOLATED TESTING AGAINST S. AGALACTIAE NOT ROUTINELY PERFORMED DUE TO PREDICTABILITY OF AMP/PEN/VAN SUSCEPTIBILITY. NO ANAEROBES ISOLATED Performed at [facility], [address] [place], [place] [address]      Lab Results  Component Value Date   ALBUMIN 1.5 (L) 07/26/2024   ALBUMIN <1.5 (L) 07/24/2024   ALBUMIN <1.5 (L) 07/23/2024    Lab Results  Component Value Date   MG 1.6 (L) 07/26/2024   MG 1.9 07/25/2024   MG 2.0 07/24/2024   No results found for: VD25OH  No results found for: PREALBUMIN    Latest Ref Rng &  Units 07/26/2024    6:41 AM 07/25/2024    5:00 AM 07/24/2024    4:01 AM  CBC EXTENDED  WBC 4.0 - 10.5 K/uL 22.0  19.7  21.3   RBC 3.87 - 5.11 MIL/uL 2.98  2.52  2.71   Hemoglobin 12.0 - 15.0 g/dL 8.6  7.3  7.7   HCT 63.9 - 46.0 % 26.2  22.6  23.6   Platelets 150 - 400 K/uL 787  712  674      There is no height or weight on file to calculate BMI.  Orders:  No orders of the defined types were placed in this encounter.  No orders of the defined types were placed in this encounter.    Procedures: No procedures performed  Clinical Data: No additional findings.  ROS:  All other systems negative, except as noted in the HPI. Review of Systems  Objective: Vital Signs: There were no vitals taken for this  visit.  Specialty Comments:  No specialty comments available.  PMFS History: Patient Active Problem List   Diagnosis Date Noted   PICC (peripherally inserted central catheter) in place 08/22/2024   Medication monitoring encounter 08/22/2024   Osteomyelitis of left tibia (HCC) 07/24/2024   Group B streptococcal infection 07/24/2024   BKA stump complication (HCC) 07/20/2024   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 (HCC) 07/20/2024   Community acquired pneumonia of right upper lobe of lung 03/17/2023   Proteinuria due to type 2 diabetes mellitus (HCC) 03/15/2023   Nausea & vomiting 03/15/2023   Lobar pneumonia 03/15/2023   Long-term (current) use of injectable non-insulin  antidiabetic drugs 03/13/2023   Diabetes mellitus (HCC) 03/13/2023   High triglycerides 08/08/2022   Essential hypertension 01/21/2022   Insulin  dependent type 2 diabetes mellitus (HCC) 01/21/2022   Chronic pain of right knee 01/13/2022   Nausea 01/13/2022   Type 2 diabetes mellitus with hyperglycemia (HCC) 11/30/2021   Hypokalemia 10/02/2021   Hypoalbuminemia 09/26/2021   Positive for macroalbuminuria 09/26/2021   Acute blood loss anemia 09/22/2021   ATN (acute tubular necrosis) 09/22/2021   AKI (acute kidney injury) 09/20/2021   Hyponatremia 09/19/2021   Diabetes mellitus type 2, noninsulin dependent (HCC) 09/19/2021   Chronic hypertension 12/16/2020   Missed abortion 12/16/2020   Status post cesarean delivery 12/15/2020   Establishing care with new doctor, encounter for 12/22/2018   Pap smear abnormality of cervix/human papillomavirus (HPV) positive 06/25/2018   BMI 33.0-33.9,adult 03/30/2016   Past Medical History:  Diagnosis Date   Chronic hypertension    12-16-2020  documented in epic since 2017 pt dx hypertension (not during pregnancy);  pt currently is not and has not been taking any bp med,  bp at [facility] 12-15-2020 165/ 104   Missed ab 12/15/2020   Type 2 diabetes mellitus (HCC)     12-16-2020  pt stated only during pregnancy took medication, at [facility] 12-15-2020 blood glucose 353 pt was given metformin  bid and pt stated did start taking it   Wears glasses     Family History  Problem Relation Age of Onset   Diabetes Mother    Hypertension Mother    Hypertension Maternal Grandmother    Diabetes Maternal Aunt    Diabetes Maternal Uncle     Past Surgical History:  Procedure Laterality Date   BELOW KNEE LEG AMPUTATION Right    CESAREAN SECTION N/A 12/25/2018   Procedure: CESAREAN SECTION;  Surgeon: Barbra Lang PARAS, DO;  Location: [facility];  Service: Obstetrics;  Laterality:  N/A;   REVISION AMPUTATION, BELOW THE KNEE Right 07/23/2024   Procedure: REVISION AMPUTATION, BELOW THE KNEE RIGHT;  Surgeon: Harden Jerona GAILS, MD;  Location: [facility] OR;  Service: Orthopedics;  Laterality: Right;   TONSILLECTOMY AND ADENOIDECTOMY  age 43   Social History   Occupational History   Not on file  Tobacco Use   Smoking status: Never   Smokeless tobacco: Never  Vaping Use   Vaping status: Never Used  Substance and Sexual Activity   Alcohol use: No   Drug use: Never   Sexual activity: Yes    Birth control/protection: None
